# Patient Record
Sex: Male | Born: 1965
Health system: Southern US, Community
[De-identification: ages and names within clinical notes are randomized; demographics above are authoritative.]

## PROBLEM LIST (undated history)

## (undated) DIAGNOSIS — F329 Major depressive disorder, single episode, unspecified: Secondary | ICD-10-CM

## (undated) DIAGNOSIS — L409 Psoriasis, unspecified: Secondary | ICD-10-CM

## (undated) DIAGNOSIS — E291 Testicular hypofunction: Secondary | ICD-10-CM

## (undated) DIAGNOSIS — G540 Brachial plexus disorders: Secondary | ICD-10-CM

## (undated) DIAGNOSIS — R5383 Other fatigue: Secondary | ICD-10-CM

## (undated) DIAGNOSIS — G43909 Migraine, unspecified, not intractable, without status migrainosus: Secondary | ICD-10-CM

## (undated) DIAGNOSIS — G4733 Obstructive sleep apnea (adult) (pediatric): Secondary | ICD-10-CM

## (undated) DIAGNOSIS — R51 Headache: Secondary | ICD-10-CM

## (undated) DIAGNOSIS — K635 Polyp of colon: Secondary | ICD-10-CM

## (undated) DIAGNOSIS — R0981 Nasal congestion: Secondary | ICD-10-CM

## (undated) DIAGNOSIS — N529 Male erectile dysfunction, unspecified: Secondary | ICD-10-CM

## (undated) DIAGNOSIS — K219 Gastro-esophageal reflux disease without esophagitis: Secondary | ICD-10-CM

## (undated) DIAGNOSIS — F32A Depression, unspecified: Secondary | ICD-10-CM

## (undated) DIAGNOSIS — K760 Fatty (change of) liver, not elsewhere classified: Secondary | ICD-10-CM

## (undated) DIAGNOSIS — R519 Headache, unspecified: Secondary | ICD-10-CM

## (undated) DIAGNOSIS — T7840XA Allergy, unspecified, initial encounter: Secondary | ICD-10-CM

## (undated) DIAGNOSIS — M1990 Unspecified osteoarthritis, unspecified site: Secondary | ICD-10-CM

## (undated) DIAGNOSIS — IMO0001 Reserved for inherently not codable concepts without codable children: Secondary | ICD-10-CM

## (undated) DIAGNOSIS — N400 Enlarged prostate without lower urinary tract symptoms: Secondary | ICD-10-CM

## (undated) DIAGNOSIS — R161 Splenomegaly, not elsewhere classified: Secondary | ICD-10-CM

## (undated) DIAGNOSIS — I10 Essential (primary) hypertension: Secondary | ICD-10-CM

## (undated) DIAGNOSIS — R7401 Elevation of levels of liver transaminase levels: Secondary | ICD-10-CM

## (undated) DIAGNOSIS — Z87442 Personal history of urinary calculi: Secondary | ICD-10-CM

## (undated) DIAGNOSIS — E785 Hyperlipidemia, unspecified: Secondary | ICD-10-CM

## (undated) DIAGNOSIS — G4730 Sleep apnea, unspecified: Secondary | ICD-10-CM

## (undated) DIAGNOSIS — R030 Elevated blood-pressure reading, without diagnosis of hypertension: Secondary | ICD-10-CM

## (undated) DIAGNOSIS — G4731 Primary central sleep apnea: Secondary | ICD-10-CM

## (undated) DIAGNOSIS — J45909 Unspecified asthma, uncomplicated: Secondary | ICD-10-CM

## (undated) DIAGNOSIS — K146 Glossodynia: Secondary | ICD-10-CM

## (undated) DIAGNOSIS — G4739 Other sleep apnea: Secondary | ICD-10-CM

## (undated) DIAGNOSIS — N200 Calculus of kidney: Secondary | ICD-10-CM

## (undated) DIAGNOSIS — R740 Nonspecific elevation of levels of transaminase and lactic acid dehydrogenase [LDH]: Secondary | ICD-10-CM

## (undated) DIAGNOSIS — F419 Anxiety disorder, unspecified: Secondary | ICD-10-CM

## (undated) HISTORY — DX: Migraine, unspecified, not intractable, without status migrainosus: G43.909

## (undated) HISTORY — DX: Headache, unspecified: R51.9

## (undated) HISTORY — DX: Testicular hypofunction: E29.1

## (undated) HISTORY — DX: Headache: R51

## (undated) HISTORY — DX: Benign prostatic hyperplasia without lower urinary tract symptoms: N40.0

## (undated) HISTORY — DX: Major depressive disorder, single episode, unspecified: F32.9

## (undated) HISTORY — DX: Sleep apnea, unspecified: G47.30

## (undated) HISTORY — PX: SCALENE NODE BIOPSY / EXCISION: SUR129

## (undated) HISTORY — DX: Allergy, unspecified, initial encounter: T78.40XA

## (undated) HISTORY — DX: Nonspecific elevation of levels of transaminase and lactic acid dehydrogenase (ldh): R74.0

## (undated) HISTORY — DX: Other fatigue: R53.83

## (undated) HISTORY — DX: Depression, unspecified: F32.A

## (undated) HISTORY — DX: Anxiety disorder, unspecified: F41.9

## (undated) HISTORY — PX: OTHER SURGICAL HISTORY: SHX169

## (undated) HISTORY — DX: Reserved for inherently not codable concepts without codable children: IMO0001

## (undated) HISTORY — DX: Male erectile dysfunction, unspecified: N52.9

## (undated) HISTORY — PX: NOSE SURGERY: SHX723

## (undated) HISTORY — DX: Calculus of kidney: N20.0

## (undated) HISTORY — DX: Elevated blood-pressure reading, without diagnosis of hypertension: R03.0

## (undated) HISTORY — DX: Unspecified osteoarthritis, unspecified site: M19.90

## (undated) HISTORY — DX: Elevation of levels of liver transaminase levels: R74.01

## (undated) HISTORY — PX: NASAL SEPTUM SURGERY: SHX37

## (undated) HISTORY — PX: VASECTOMY: SHX75

---
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
## 2005-02-12 ENCOUNTER — Encounter: Admission: RE | Admit: 2005-02-12 | Discharge: 2005-02-12 | Payer: Self-pay | Admitting: Thoracic Surgery

## 2005-06-22 ENCOUNTER — Emergency Department: Payer: Self-pay | Admitting: Unknown Physician Specialty

## 2006-12-24 ENCOUNTER — Other Ambulatory Visit: Payer: Self-pay

## 2006-12-24 ENCOUNTER — Emergency Department: Payer: Self-pay | Admitting: Emergency Medicine

## 2007-01-25 ENCOUNTER — Ambulatory Visit: Payer: Self-pay | Admitting: Pain Medicine

## 2009-09-08 ENCOUNTER — Emergency Department: Payer: Self-pay | Admitting: Emergency Medicine

## 2009-12-22 HISTORY — PX: THORACIC OUTLET SURGERY: SHX2502

## 2009-12-22 HISTORY — PX: OTHER SURGICAL HISTORY: SHX169

## 2014-04-26 ENCOUNTER — Ambulatory Visit: Payer: Self-pay

## 2015-03-24 DIAGNOSIS — R5383 Other fatigue: Secondary | ICD-10-CM | POA: Insufficient documentation

## 2015-03-24 DIAGNOSIS — N529 Male erectile dysfunction, unspecified: Secondary | ICD-10-CM | POA: Insufficient documentation

## 2015-03-24 DIAGNOSIS — N400 Enlarged prostate without lower urinary tract symptoms: Secondary | ICD-10-CM | POA: Insufficient documentation

## 2015-03-24 DIAGNOSIS — I10 Essential (primary) hypertension: Secondary | ICD-10-CM | POA: Insufficient documentation

## 2015-03-24 DIAGNOSIS — IMO0002 Reserved for concepts with insufficient information to code with codable children: Secondary | ICD-10-CM | POA: Insufficient documentation

## 2015-03-24 DIAGNOSIS — R740 Nonspecific elevation of levels of transaminase and lactic acid dehydrogenase [LDH]: Secondary | ICD-10-CM

## 2015-04-02 ENCOUNTER — Telehealth: Payer: Self-pay

## 2015-04-02 LAB — HEPATIC FUNCTION PANEL
ALT: 33 U/L (ref 10–40)
AST: 26 U/L (ref 14–40)
Alkaline Phosphatase: 82 U/L (ref 25–125)
Bilirubin, Direct: 0.4 mg/dL (ref 0.01–0.4)
Bilirubin, Total: 0.4 mg/dL

## 2015-04-02 LAB — BASIC METABOLIC PANEL
BUN: 17 mg/dL (ref 4–21)
CREATININE: 1 mg/dL (ref 0.6–1.3)
Glucose: 87 mg/dL
POTASSIUM: 5 mmol/L (ref 3.4–5.3)
SODIUM: 141 mmol/L (ref 137–147)

## 2015-04-02 LAB — CBC AND DIFFERENTIAL
PLATELETS: 211 10*3/uL (ref 150–399)
WBC: 7.2 10*3/mL

## 2015-04-02 NOTE — Telephone Encounter (Signed)
Who is the patient's wife?  Will need to call and see if can get information regarding her and then send as a staff message if she is not in our system.  In reviewing the chart, I have never seen this pt either.

## 2015-04-02 NOTE — Telephone Encounter (Signed)
The patient's wife called and is hoping to become an established pt with Dr.Scott.

## 2015-04-04 ENCOUNTER — Ambulatory Visit (INDEPENDENT_AMBULATORY_CARE_PROVIDER_SITE_OTHER): Payer: 59 | Admitting: Nurse Practitioner

## 2015-04-04 ENCOUNTER — Encounter (INDEPENDENT_AMBULATORY_CARE_PROVIDER_SITE_OTHER): Payer: Self-pay

## 2015-04-04 ENCOUNTER — Encounter: Payer: Self-pay | Admitting: Nurse Practitioner

## 2015-04-04 VITALS — BP 108/78 | HR 89 | Temp 98.5°F | Resp 14 | Ht 74.0 in | Wt 205.8 lb

## 2015-04-04 DIAGNOSIS — G4730 Sleep apnea, unspecified: Secondary | ICD-10-CM | POA: Diagnosis not present

## 2015-04-04 DIAGNOSIS — Z91048 Other nonmedicinal substance allergy status: Secondary | ICD-10-CM

## 2015-04-04 DIAGNOSIS — Z7189 Other specified counseling: Secondary | ICD-10-CM | POA: Diagnosis not present

## 2015-04-04 DIAGNOSIS — G43809 Other migraine, not intractable, without status migrainosus: Secondary | ICD-10-CM

## 2015-04-04 DIAGNOSIS — R5382 Chronic fatigue, unspecified: Secondary | ICD-10-CM

## 2015-04-04 DIAGNOSIS — Z87442 Personal history of urinary calculi: Secondary | ICD-10-CM

## 2015-04-04 DIAGNOSIS — Z7689 Persons encountering health services in other specified circumstances: Secondary | ICD-10-CM

## 2015-04-04 DIAGNOSIS — L409 Psoriasis, unspecified: Secondary | ICD-10-CM

## 2015-04-04 DIAGNOSIS — F4323 Adjustment disorder with mixed anxiety and depressed mood: Secondary | ICD-10-CM

## 2015-04-04 DIAGNOSIS — M542 Cervicalgia: Secondary | ICD-10-CM

## 2015-04-04 DIAGNOSIS — G629 Polyneuropathy, unspecified: Secondary | ICD-10-CM

## 2015-04-04 DIAGNOSIS — Z9109 Other allergy status, other than to drugs and biological substances: Secondary | ICD-10-CM

## 2015-04-04 NOTE — Progress Notes (Signed)
Subjective:    Patient ID: Daniel Calderon, male    DOB: [birth year], 49 y.o.   MRN: [id number]  HPI  Mr. Aderman is a 49 yo male establishing care and CC of fatigue.  1) Health Maintenance-   Diet- Cutting down on beer  Exercise- Active in daily life  Immunizations- UTD  Eye Exam- Not UTD  Dental Exam- Not UTD  PSA- prostate issues, 0.3 on 08/30/14     2) Chronic Problems-  Anxiety/Depression- worker's comp injury for several years   Omeprazole- stable on this   Nuvaigil one tablet a day  Testosterone   Neck pain- 7 nerve blocks- 1 caused numbness on the left side of the head   CPAP- use at night, 8 hours of sleep  Allergies- flonase and claritin D   Kidney stones- 3 years ago several episodes    Migraines- frequent, tired, neck problems, uses tens unit   3) Acute Problems-  Fatigue- allergies, trying honey 1 tsp daily from local source  2 weeks ago left work on a half day and stayed out the next day, aching, eyes blurry, fatigue, rested and felt somewhat better happened again later with vibrating feeling of bilateral legs for 5 minutes on and off   Has psoriasis on knees Right shouler, and lowe leg laterally was burning/stinging, wounds pop up randomly he reports.    Review of Systems  Constitutional: Positive for diaphoresis and fatigue. Negative for fever and chills.       Night sweats  HENT: Negative for tinnitus and trouble swallowing.   Eyes: Positive for visual disturbance.  Respiratory: Negative for chest tightness, shortness of breath and wheezing.   Cardiovascular: Negative for chest pain, palpitations and leg swelling.  Gastrointestinal: Negative for nausea, vomiting, diarrhea and constipation.  Genitourinary: Negative for difficulty urinating.  Musculoskeletal: Positive for back pain, arthralgias and neck pain. Negative for gait problem.  Skin: Positive for rash.  Allergic/Immunologic: Positive for environmental allergies. Negative for food allergies.    Neurological: Positive for weakness, numbness and headaches. Negative for dizziness.  Hematological: Does not bruise/bleed easily.  Psychiatric/Behavioral: Positive for decreased concentration. Negative for suicidal ideas. The patient is nervous/anxious.    Past Medical History  Diagnosis Date  . Allergy     Seasonal  . Anxiety   . Depression   . Sleep apnea     Currently uses cpap  . Kidney stones     History of kidney stones  . Migraine   . Frequent headaches     History   Social History  . Marital Status: Married    Spouse Name: N/A  . Number of Children: N/A  . Years of Education: N/A   Occupational History  . Not on file.   Social History Main Topics  . Smoking status: Never Smoker   . Smokeless tobacco: Never Used  . Alcohol Use: 1.2 oz/week    2 Glasses of wine per week  . Drug Use: No  . Sexual Activity:    Partners: Female     Comment: Wife   Other Topics Concern  . Not on file   Social History Narrative   [employer]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    Lives with wife and daughter (60)   High school and tech school   Caffeine- 2-3 coffee    Enjoys- yard work, Location manager- 2 dogs        Past Surgical History  Procedure Laterality Date  . Left shoulder surgery  2011  .  Scalene node biopsy / excision    . Nose surgery    . Nerve block      2 in neck. 5 in back.  . Vasectomy      Family History  Problem Relation Age of Onset  . Hypertension Mother   . Hearing loss Mother   . Heart disease Other   . Diabetes Brother   . Stroke Paternal Uncle   . Hearing loss Maternal Grandmother     Allergies  Allergen Reactions  . Erythromycin Nausea And Vomiting  . Ibuprofen Itching    Current Outpatient Prescriptions on File Prior to Visit  Medication Sig Dispense Refill  . fluticasone (FLONASE) 50 MCG/ACT nasal spray Place into the nose.    Marland Kitchen OMEPRAZOLE PO Take by mouth.    . tadalafil (CIALIS) 20 MG tablet Take by mouth.    . Testosterone 75 MG PLLT  TESTOPEL, 75MG  (Implant Pellet) - Historical Medication  (75 MG) Active     No current facility-administered medications on file prior to visit.      Objective:   Physical Exam  Constitutional: He is oriented to person, place, and time. He appears well-developed and well-nourished. No distress.  BP 108/78 mmHg  Pulse 89  Temp(Src) 98.5 F (36.9 C) (Oral)  Resp 14  Ht 6\' 2"  (1.88 m)  Wt 205 lb 12.8 oz (93.35 kg)  BMI 26.41 kg/m2  SpO2 97%   HENT:  Head: Normocephalic and atraumatic.  Right Ear: External ear normal.  Left Ear: External ear normal.  Eyes: Right eye exhibits no discharge. Left eye exhibits no discharge. No scleral icterus.  Neck: Normal range of motion. Neck supple.  Cardiovascular: Normal rate, regular rhythm, normal heart sounds and intact distal pulses.  Exam reveals no gallop and no friction rub.   No murmur heard. Pulmonary/Chest: Effort normal and breath sounds normal. No respiratory distress. He has no wheezes. He has no rales. He exhibits no tenderness.  Lymphadenopathy:    He has no cervical adenopathy.  Neurological: He is alert and oriented to person, place, and time.  Skin: Skin is warm and dry. He is not diaphoretic.  Small well healing scars on legs (2 less approx 1 cm)  Psychiatric: He has a normal mood and affect. His behavior is normal. Judgment and thought content normal.      Assessment & Plan:

## 2015-04-04 NOTE — Progress Notes (Signed)
Pre visit review using our clinic review tool, if applicable. No additional management support is needed unless otherwise documented below in the visit note. 

## 2015-04-04 NOTE — Patient Instructions (Signed)
Try the gabapentin.  Follow up with neurology on the 10th.   See if they can forward latest blood work to [name] when it comes back.   Follow up in 1 month.

## 2015-04-11 ENCOUNTER — Other Ambulatory Visit: Payer: Self-pay | Admitting: *Deleted

## 2015-04-11 MED ORDER — SERTRALINE HCL 100 MG PO TABS: 100.0000 mg | ORAL_TABLET | Freq: Every day | ORAL | Status: DC

## 2015-04-11 NOTE — Telephone Encounter (Signed)
Left VM, needing refill Sertraline. Rx sent to pharmacy by escript

## 2015-04-13 DIAGNOSIS — Z9109 Other allergy status, other than to drugs and biological substances: Secondary | ICD-10-CM | POA: Insufficient documentation

## 2015-04-13 DIAGNOSIS — G4730 Sleep apnea, unspecified: Secondary | ICD-10-CM | POA: Insufficient documentation

## 2015-04-13 DIAGNOSIS — G43909 Migraine, unspecified, not intractable, without status migrainosus: Secondary | ICD-10-CM | POA: Insufficient documentation

## 2015-04-13 DIAGNOSIS — F4323 Adjustment disorder with mixed anxiety and depressed mood: Secondary | ICD-10-CM | POA: Insufficient documentation

## 2015-04-13 DIAGNOSIS — Z87442 Personal history of urinary calculi: Secondary | ICD-10-CM | POA: Insufficient documentation

## 2015-04-13 DIAGNOSIS — Z7689 Persons encountering health services in other specified circumstances: Secondary | ICD-10-CM | POA: Insufficient documentation

## 2015-04-13 DIAGNOSIS — G629 Polyneuropathy, unspecified: Secondary | ICD-10-CM | POA: Insufficient documentation

## 2015-04-13 DIAGNOSIS — L409 Psoriasis, unspecified: Secondary | ICD-10-CM | POA: Insufficient documentation

## 2015-04-13 DIAGNOSIS — M542 Cervicalgia: Secondary | ICD-10-CM | POA: Insufficient documentation

## 2015-04-13 NOTE — Assessment & Plan Note (Signed)
Pt reports this is from occipital neuralgia. Has TENS unit.

## 2015-04-13 NOTE — Assessment & Plan Note (Signed)
On knees, pt reports he was seeing dermatology for this.

## 2015-04-13 NOTE — Assessment & Plan Note (Signed)
7 nerve blocks 1 resulted in numbness on the left side of the head

## 2015-04-13 NOTE — Assessment & Plan Note (Signed)
Uncontrolled. Pt has severe episodes of fatigue intermittently. Will follow.

## 2015-04-13 NOTE — Assessment & Plan Note (Signed)
8 hours of sleep with CPAP, still wakes up tired. Will follow.

## 2015-04-13 NOTE — Assessment & Plan Note (Signed)
Worker's comp injury. Pt reports anxiety with mixed bouts of depression. Controlled on Zoloft.

## 2015-04-13 NOTE — Assessment & Plan Note (Signed)
Flonase, Claritin D and 1 tsp daily of local honey. Somewhat controlled.

## 2015-04-13 NOTE — Assessment & Plan Note (Signed)
Uncontrolled. Pt was given gabapentin, but has not started or picked it up yet. Asked pt to try it. Will follow up in 1 month

## 2015-04-13 NOTE — Assessment & Plan Note (Signed)
Discussed acute and chronic issues. Reviewed health maintenance measures, PFSHx, and immunizations.   

## 2015-05-03 ENCOUNTER — Ambulatory Visit: Payer: 59 | Admitting: Nurse Practitioner

## 2015-05-03 DIAGNOSIS — Z0289 Encounter for other administrative examinations: Secondary | ICD-10-CM

## 2015-05-07 ENCOUNTER — Encounter: Payer: Self-pay | Admitting: Nurse Practitioner

## 2015-05-24 ENCOUNTER — Ambulatory Visit (INDEPENDENT_AMBULATORY_CARE_PROVIDER_SITE_OTHER): Payer: 59 | Admitting: Urology

## 2015-05-24 ENCOUNTER — Encounter: Payer: Self-pay | Admitting: Urology

## 2015-05-24 VITALS — BP 138/85 | HR 90 | Ht 74.0 in | Wt 199.7 lb

## 2015-05-24 DIAGNOSIS — F5221 Male erectile disorder: Secondary | ICD-10-CM | POA: Diagnosis not present

## 2015-05-24 DIAGNOSIS — E291 Testicular hypofunction: Secondary | ICD-10-CM | POA: Insufficient documentation

## 2015-05-24 DIAGNOSIS — N400 Enlarged prostate without lower urinary tract symptoms: Secondary | ICD-10-CM

## 2015-05-24 NOTE — Progress Notes (Signed)
05/24/2015 2:21 PM   Daniel Calderon 1966/08/03 [id number]  Referring provider: Rubbie Battiest, NP [address] [place],  [address]  Chief Complaint  Patient presents with  . Hypogonadism  . Benign Prostatic Hypertrophy    HPI:    Hypogonadism-  Patient presents today for a recheck on his hypogonadism.  His hypogonadism is currently being managed with Testopel. His last insertion was on 01/19/2015. His last  total serum testosterone on 02/20/2015 was 605 ng/dL.  He has filled out the ADAM questionnaire (results below).  He has noticed over the last 2 weeks an increase in the lack of energy, his erections being less strong, a deterioration in his ability to play sports, a deterioration in his work performance and falling asleep after dinner. Patient has been diagnosed with obstructive sleep apnea and he does use his CPAP machine nightly. He states he still experiences sadness and/or grumpiness even when his testosterone levels are within normal range.                         ADAM Questionnaire             #1. Do you have a decrease in libido? No  #2. Do you have a lack of energy? Yes-started 2 weeks ago  #3. Do you have a decrease in strength and/or endurance? Yes-varies  #4. Have you lost height? No  #5. Have you noticed a decreased "enjoyment of life?" No  #6. Are you sad and/or grumpy? Yes  #7. Are your erections less strong? Yes-started 2 weeks ago  #8. Have you noticed a recent deterioration in your ability to play sports? Yes-started 2 weeks ago  #9. Are you falling asleep after dinner? Yes-started 2 weeks ago  BPH- Today, patient's IPSS score is 1/1. On DRE, he has mild prostate enlargement and no nodules were appreciated. He does experience urinary intermittency, urgency and nocturia he does not find any symptoms bothersome to him. He denies any dysuria, recent UTIs and/or gross hematuria. He also denies any associated fevers, chills, nausea,  vomiting and/or suprapubic pain.  Erectile dysfunction- Patient's ED is managed with on-demand Cialis 20 mg.  He is not reported any painful erections and/or curvature with his erections.      PMH: Past Medical History  Diagnosis Date  . Allergy     Seasonal  . Anxiety   . Depression   . Sleep apnea     Currently uses cpap  . Kidney stones     History of kidney stones  . Migraine   . Frequent headaches   . Hypogonadism in male   . Failure of erection   . Benign enlargement of prostate   . Elevated transaminase level     Surgical History: Past Surgical History  Procedure Laterality Date  . Left shoulder surgery  2011  . Scalene node biopsy / excision    . Nose surgery    . Nerve block      2 in neck. 5 in back.  . Vasectomy      Home Medications:    Medication List       This list is accurate as of: 05/24/15  2:21 PM.  Always use your most recent med list.               Armodafinil 250 MG tablet  Take 250 mg by mouth daily.     fluticasone 50 MCG/ACT nasal spray  Commonly  known as:  FLONASE  Place into the nose.     OMEPRAZOLE PO  Take by mouth.     sertraline 100 MG tablet  Commonly known as:  ZOLOFT  Take 1 tablet (100 mg total) by mouth daily.     tadalafil 20 MG tablet  Commonly known as:  CIALIS  Take by mouth.     Testosterone 75 MG Pllt  TESTOPEL, 75MG  (Implant Pellet) - Historical Medication  (75 MG) Active        Allergies:  Allergies  Allergen Reactions  . Erythromycin Nausea And Vomiting  . Cephalexin     Other reaction(s): Other (See Comments) Other Reaction: Other reaction  . Dexamethasone     Other reaction(s): Other (See Comments) Other Reaction: Other reaction  . Oxycodone-Acetaminophen     Other reaction(s): Other (See Comments) Other Reaction: Other reaction  . Prednisone     Other reaction(s): Other (See Comments) Other Reaction: Other reaction  . Ibuprofen Itching    Family History: Family History  Problem  Relation Age of Onset  . Hypertension Mother   . Hearing loss Mother   . Heart disease Other   . Diabetes Brother   . Stroke Paternal Uncle   . Hearing loss Maternal Grandmother     Social History:  reports that he has never smoked. He has never used smokeless tobacco. He reports that he drinks about 1.2 oz of alcohol per week. He reports that he does not use illicit drugs.  ROS: Urological Symptom Review  Patient is experiencing the following symptoms: Hard to postpone urination, nocturia and intermittency   Review of Systems  Gastrointestinal (upper)  : Negative for upper GI symptoms  Gastrointestinal (lower) : Negative for lower GI symptoms  Constitutional : Negative for symptoms  Skin: Hair loss  Eyes: Negative for eye symptoms  Ear/Nose/Throat : Sinus problems  Hematologic/Lymphatic: Negative for Hematologic/Lymphatic symptoms  Cardiovascular : Negative for cardiovascular symptoms  Respiratory : Shortness of breath  Endocrine: Negative for endocrine symptoms  Musculoskeletal: Back pain Joint pain  Neurological: Headaches  Psychologic: Depression Anxiety   Physical Exam: BP 138/85 mmHg  Pulse 90  Ht 6\' 2"  (1.88 m)  Wt 199 lb 11.2 oz (90.583 kg)  BMI 25.63 kg/m2  GU: Patient with a circumcised penis.  Patent meatus. No urethral discharge. Normal penis. Scrotum without lesions and/or swelling. Testicles located scrotally bilaterally, no masses appreciated.  Epididymis are normal bilaterally.  Rectal:  Patient with normal sphincter tone.  Some external hemorrhoids are noted.  Prostate is ~50 grams, no nodules appreciated.  Seminal vesicals are normal.   Laboratory Data: Lab Results  Component Value Date   WBC 7.2 04/02/2015   PLT 211 04/02/2015    Lab Results  Component Value Date   CREATININE 1.0 04/02/2015    No results found for: PSA  No results found for: TESTOSTERONE  No results found for: HGBA1C  Urinalysis No results  found for: COLORURINE, APPEARANCEUR, LABSPEC, PHURINE, GLUCOSEU, HGBUR, BILIRUBINUR, KETONESUR, PROTEINUR, UROBILINOGEN, NITRITE, LEUKOCYTESUR  Pertinent Imaging:  Assessment & Plan:  1. Hypogonadism-  His hypogonadism is currently being managed with Testopel. His last insertion was on 01/19/2015. His last  total serum testosterone on 02/20/2015 was 605 ng/dL.  He had his morning total serum testosterone level/ HCT/ PSA drawn this am before the appointment.  He will be scheduled for a Testopel insertion in one week.  If labs return abnormal and/or the testosterone is in the normal range, we will post-pone the  insertion.  I also explained to the patient the new FDA restrictions for the pellets, which is 6 pellets every 90 days.    2. BPH with LUTS-  Patient's past PSA's are 0.3 ng/mL on 08/30/2014 and 0.3 ng/mL on 01/09/2015.  He had his PSA drawn today. His IPSS score today is 6/1.  Since his symptoms are mild, we will follow the patient with an IPSS/DRE/PSA every 6 months.   3. Erectile dysfunction- Patient's ED is well managed with on demand Cialis 20 mg.  He does not require a refill today.  He will fill out a SHIM at his next office visit.   Problem List Items Addressed This Visit      Endocrine   Hypogonadism in male - Primary   Relevant Orders   PSA   Testosterone   Hematocrit    Other Visit Diagnoses    BPH (benign prostatic hyperplasia)        Relevant Orders    PSA    Testosterone    Hematocrit    Erectile disorder, generalized, mild           No Follow-up on file.  [facility] [address] [place], [place] [address] [phone]

## 2015-05-25 ENCOUNTER — Telehealth: Payer: Self-pay | Admitting: Urology

## 2015-05-25 LAB — TESTOSTERONE: Testosterone: 339 ng/dL — ABNORMAL LOW (ref 348–1197)

## 2015-05-25 LAB — PSA: PROSTATE SPECIFIC AG, SERUM: 0.3 ng/mL (ref 0.0–4.0)

## 2015-05-25 LAB — HEMATOCRIT: HEMATOCRIT: 44.8 % (ref 37.5–51.0)

## 2015-05-25 NOTE — Telephone Encounter (Signed)
Lab works is normal for the exception of the serum testosterone, which is low.  We expected this.  It is appropriate to proceed with Testopel.

## 2015-05-29 NOTE — Progress Notes (Signed)
Thank you Larene Beach for seeing him. Our patients are always singing your praises! Have a great week.

## 2015-06-01 ENCOUNTER — Ambulatory Visit: Payer: Self-pay | Admitting: Urology

## 2015-06-05 ENCOUNTER — Ambulatory Visit (INDEPENDENT_AMBULATORY_CARE_PROVIDER_SITE_OTHER): Payer: 59 | Admitting: Urology

## 2015-06-05 ENCOUNTER — Encounter: Payer: Self-pay | Admitting: Urology

## 2015-06-05 VITALS — BP 133/83 | HR 86 | Ht 74.0 in | Wt 196.4 lb

## 2015-06-05 DIAGNOSIS — E291 Testicular hypofunction: Secondary | ICD-10-CM

## 2015-06-05 NOTE — Progress Notes (Signed)
06/05/2015 1:06 PM   [name] 1966/05/24 [id number]  Referring provider: Rubbie Battiest, NP [address] [place], [place] [address]  Chief Complaint  Patient presents with  . Hypogonadism    Testopel placement    HPI: Mr. Hue is a 49 y/o white male with hypogonadism who was referred to [name] in 2015 by his PCP after his insurance would no longer cover his gel.  We discussed other treatment options at that visit and the risks associated with each.  He elected to try the pellets.  He has had three insertions prior to this visit.  He has been pleased with the results he has been getting with the Testopel.    He also has a h/o ED.  He is taking Cialis 20 mg with good results.    PMH: Past Medical History  Diagnosis Date  . Allergy     Seasonal  . Anxiety   . Depression   . Sleep apnea     Currently uses cpap  . Kidney stones     History of kidney stones  . Migraine   . Frequent headaches   . Hypogonadism in male   . Failure of erection   . Benign enlargement of prostate   . Elevated transaminase level     Surgical History: Past Surgical History  Procedure Laterality Date  . Left shoulder surgery  2011  . Scalene node biopsy / excision    . Nose surgery    . Nerve block      2 in neck. 5 in back.  . Vasectomy      Home Medications:    Medication List       This list is accurate as of: 06/05/15  1:06 PM.  Always use your most recent med list.               Armodafinil 250 MG tablet  Take 250 mg by mouth daily.     fluticasone 50 MCG/ACT nasal spray  Commonly known as:  FLONASE  Place into the nose.     OMEPRAZOLE PO  Take by mouth.     sertraline 100 MG tablet  Commonly known as:  ZOLOFT  Take 1 tablet (100 mg total) by mouth daily.     tadalafil 20 MG tablet  Commonly known as:  CIALIS  Take by mouth.     Testosterone 75 MG Pllt  TESTOPEL, 75MG  (Implant Pellet) - Historical Medication  (75 MG) Active         Allergies:  Allergies  Allergen Reactions  . Erythromycin Nausea And Vomiting  . Cephalexin     Other reaction(s): Other (See Comments) Other Reaction: Other reaction  . Dexamethasone     Other reaction(s): Other (See Comments) Other Reaction: Other reaction  . Oxycodone-Acetaminophen     Other reaction(s): Other (See Comments) Other Reaction: Other reaction  . Prednisone     Other reaction(s): Other (See Comments) Other Reaction: Other reaction  . Ibuprofen Itching    Family History: Family History  Problem Relation Age of Onset  . Hypertension Mother   . Hearing loss Mother   . Heart disease Other   . Diabetes Brother   . Stroke Paternal Uncle   . Hearing loss Maternal Grandmother     Social History:  reports that he has never smoked. He has never used smokeless tobacco. He reports that he drinks about 1.2 oz of alcohol per week. He reports that he does not  use illicit drugs.  ROS: Urological Symptom Review  Patient is experiencing the following symptoms: Erection problems (male only)   Review of Systems  Gastrointestinal (upper)  : Negative for upper GI symptoms  Gastrointestinal (lower) : Negative for lower GI symptoms  Constitutional : Negative for symptoms  Skin: Negative for skin symptoms  Eyes: Negative for eye symptoms  Ear/Nose/Throat : Negative for Ear/Nose/Throat symptoms  Hematologic/Lymphatic: Negative for Hematologic/Lymphatic symptoms  Cardiovascular : Negative for cardiovascular symptoms  Respiratory : Shortness of breath  Endocrine: Negative for endocrine symptoms  Musculoskeletal: Back pain Joint pain  Neurological: Headaches  Psychologic: Depression Anxiety   Physical Exam: BP 133/83 mmHg  Pulse 86  Ht 6\' 2"  (1.88 m)  Wt 196 lb 6.4 oz (89.086 kg)  BMI 25.21 kg/m2   Laboratory Data: Lab Results  Component Value Date   WBC 7.2 04/02/2015   HCT 44.8 05/24/2015   PLT 211 04/02/2015    Lab Results   Component Value Date   CREATININE 1.0 04/02/2015    No results found for: PSA  Lab Results  Component Value Date   TESTOSTERONE 339* 05/24/2015    No results found for: HGBA1C  Urinalysis No results found for: COLORURINE, APPEARANCEUR, LABSPEC, [place], GLUCOSEU, HGBUR, BILIRUBINUR, KETONESUR, PROTEINUR, UROBILINOGEN, NITRITE, LEUKOCYTESUR  Procedure: This is a 49 year old male with hypogonadism and he is managed with Testopel. He presents today for Testopel insertion.  Patient is placed on the exam table in the left lateral jackknife position.  Identified upper outer quadrant of hip for insertion; prepped area with Betadine and injected 10 cc's of Lidocaine 1% with Epinephrine to anesthetize superficially and distally along trocar tract.  Made 3 mm incision using 15 blade of scalpel; trocar with sharp ended stylet was inserted into subcutaneous tissue in line with femur. Sharp stylet was withdrawn and 6 pellets were placed into trocar well. Testopel pellets advanced into tissue using blunt ended stylet. Trocar removed and incision closed using 6 Steri-Strips. Cleansed area to remove Betadine and covered Steri-Strips with outer Band-Aid.  Careful inspection of insertion is done and patient informed of post procedure instructions.  He will return in one month for serum testosterone before 10:00 am.   Assessment & Plan:    1. Hypogonadism- Patient underwent Testopel insertion today.  He will RTC in one month for serum testosterone (8-10am)  2. Erectile dysfunction- Patient having good success with Cialis 20 mg.  He will RTC in 6 months for SHIM and symptom recheck.  3. BPH-  No family h/o PCa.  He will RTC in 6 months for IPSS and DRE.    There are no diagnoses linked to this encounter.  No Follow-up on file.  Zara Council, [facility] [address], [place], [place] [address] [phone]

## 2015-06-26 ENCOUNTER — Ambulatory Visit (INDEPENDENT_AMBULATORY_CARE_PROVIDER_SITE_OTHER): Payer: 59 | Admitting: Nurse Practitioner

## 2015-06-26 VITALS — BP 124/86 | HR 89 | Temp 98.4°F | Resp 18 | Ht 74.0 in | Wt 201.2 lb

## 2015-06-26 DIAGNOSIS — G629 Polyneuropathy, unspecified: Secondary | ICD-10-CM | POA: Diagnosis not present

## 2015-06-26 MED ORDER — PREGABALIN 25 MG PO CAPS: 25.0000 mg | ORAL_CAPSULE | Freq: Three times a day (TID) | ORAL | Status: DC

## 2015-06-26 MED ORDER — PREGABALIN 50 MG PO CAPS: 50.0000 mg | ORAL_CAPSULE | Freq: Three times a day (TID) | ORAL | Status: DC

## 2015-06-26 NOTE — Progress Notes (Signed)
   Subjective:    Patient ID: Daniel Calderon, male    DOB: 02-Apr-1966, 49 y.o.   MRN: [id number]  HPI  Mr. Cohenour is a 49 yo male with a CC of neuropathy.   1) Knee to side of leg, few numb toes,   Cancelled the appointment to see neurology, would like another referral for a in-network provider.  1 week and a half of the symptoms Burning on right leg  Numbness left side of toe and heel (same on both sides)   Lateral right knee and then top of foot Extended foot   Lasted for a few seconds- burning severe   Getting worse- more often, daily- hourly he reports   Cymbalta- not helpful  Methadone- Not helpful  Gabapentin- aggressive   Creatinine 0.98 in April 2016   Review of Systems  Constitutional: Negative for fever, chills, diaphoresis and fatigue.  Eyes: Negative for visual disturbance.  Cardiovascular: Negative for chest pain, palpitations and leg swelling.  Skin: Negative for rash.  Neurological: Positive for numbness. Negative for dizziness, tremors, seizures, syncope, facial asymmetry, speech difficulty, weakness, light-headedness and headaches.  Hematological: Does not bruise/bleed easily.  Psychiatric/Behavioral: Negative for suicidal ideas, sleep disturbance and self-injury. The patient is nervous/anxious.       Objective:   Physical Exam  Constitutional: He is oriented to person, place, and time. He appears well-developed and well-nourished. No distress.  BP 124/86 mmHg  Pulse 89  Temp(Src) 98.4 F (36.9 C)  Resp 18  Ht 6\' 2"  (1.88 m)  Wt 201 lb 3.2 oz (91.264 kg)  BMI 25.82 kg/m2  SpO2 96%   Musculoskeletal: Normal range of motion. He exhibits no edema or tenderness.  Neurological: He is alert and oriented to person, place, and time. He displays normal reflexes. No cranial nerve deficit. He exhibits normal muscle tone. Coordination normal.  Skin: Skin is warm and dry. No rash noted. He is not diaphoretic.  Psychiatric: Thought content normal. His mood  appears anxious. His speech is tangential. He is agitated. He expresses impulsivity. He exhibits normal recent memory and normal remote memory.      Assessment & Plan:  I personally spent 25 minutes face to face with the pt with greater than 50% of the time spent counseling on possible treatments, answering questions causes of neuropathic pain, and referral information.

## 2015-06-26 NOTE — Patient Instructions (Signed)
Pregabalin capsules What is this medicine? PREGABALIN (pre GAB a lin) is used to treat nerve pain from diabetes, shingles, spinal cord injury, and fibromyalgia. It is also used to control seizures in epilepsy. This medicine may be used for other purposes; ask your health care provider or pharmacist if you have questions. COMMON BRAND NAME(S): Lyrica What should I tell my health care provider before I take this medicine? They need to know if you have any of these conditions: -bleeding problems -heart disease, including heart failure -history of alcohol or drug abuse -kidney disease -suicidal thoughts, plans, or attempt; a previous suicide attempt by you or a family member -an unusual or allergic reaction to pregabalin, gabapentin, other medicines, foods, dyes, or preservatives -pregnant or trying to get pregnant or trying to conceive with your partner -breast-feeding How should I use this medicine? Take this medicine by mouth with a glass of water. Follow the directions on the prescription label. You can take this medicine with or without food. Take your doses at regular intervals. Do not take your medicine more often than directed. Do not stop taking except on your doctor's advice. A special MedGuide will be given to you by the pharmacist with each prescription and refill. Be sure to read this information carefully each time. Talk to your pediatrician regarding the use of this medicine in children. Special care may be needed. Overdosage: If you think you have taken too much of this medicine contact a poison control center or emergency room at once. NOTE: This medicine is only for you. Do not share this medicine with others. What if I miss a dose? If you miss a dose, take it as soon as you can. If it is almost time for your next dose, take only that dose. Do not take double or extra doses. What may interact with this medicine? -alcohol -certain medicines for blood pressure like captopril,  enalapril, or lisinopril -certain medicines for diabetes, like pioglitazone or rosiglitazone -certain medicines for anxiety or sleep -narcotic medicines for pain This list may not describe all possible interactions. Give your health care provider a list of all the medicines, herbs, non-prescription drugs, or dietary supplements you use. Also tell them if you smoke, drink alcohol, or use illegal drugs. Some items may interact with your medicine. What should I watch for while using this medicine? Tell your doctor or healthcare professional if your symptoms do not start to get better or if they get worse. Visit your doctor or health care professional for regular checks on your progress. Do not stop taking except on your doctor's advice. You may develop a severe reaction. Your doctor will tell you how much medicine to take. Wear a medical identification bracelet or chain if you are taking this medicine for seizures, and carry a card that describes your disease and details of your medicine and dosage times. You may get drowsy or dizzy. Do not drive, use machinery, or do anything that needs mental alertness until you know how this medicine affects you. Do not stand or sit up quickly, especially if you are an older patient. This reduces the risk of dizzy or fainting spells. Alcohol may interfere with the effect of this medicine. Avoid alcoholic drinks. If you have a heart condition, like congestive heart failure, and notice that you are retaining water and have swelling in your hands or feet, contact your health care provider immediately. The use of this medicine may increase the chance of suicidal thoughts or actions. Pay special attention   to how you are responding while on this medicine. Any worsening of mood, or thoughts of suicide or dying should be reported to your health care professional right away. This medicine has caused reduced sperm counts in some men. This may interfere with the ability to father a  child. You should talk to your doctor or health care professional if you are concerned about your fertility. Women who become pregnant while using this medicine for seizures may enroll in the North American Antiepileptic Drug Pregnancy Registry by calling 1-888-233-2334. This registry collects information about the safety of antiepileptic drug use during pregnancy. What side effects may I notice from receiving this medicine? Side effects that you should report to your doctor or health care professional as soon as possible: -allergic reactions like skin rash, itching or hives, swelling of the face, lips, or tongue -breathing problems -changes in vision -chest pain -confusion -jerking or unusual movements of any part of your body -loss of memory -muscle pain, tenderness, or weakness -suicidal thoughts or other mood changes -swelling of the ankles, feet, hands -unusual bruising or bleeding Side effects that usually do not require medical attention (Report these to your doctor or health care professional if they continue or are bothersome.): -dizziness -drowsiness -dry mouth -headache -nausea -tremors -trouble sleeping -weight gain This list may not describe all possible side effects. Call your doctor for medical advice about side effects. You may report side effects to FDA at 1-800-FDA-1088. Where should I keep my medicine? Keep out of the reach of children. This medicine can be abused. Keep your medicine in a safe place to protect it from theft. Do not share this medicine with anyone. Selling or giving away this medicine is dangerous and against the law. Store at room temperature between 15 and 30 degrees C (59 and 86 degrees F). Throw away any unused medicine after the expiration date. NOTE: This sheet is a summary. It may not cover all possible information. If you have questions about this medicine, talk to your doctor, pharmacist, or health care provider.  2015, Elsevier/Gold Standard.  (2011-06-12 20:00:36)  

## 2015-06-26 NOTE — Progress Notes (Signed)
Pre visit review using our clinic review tool, if applicable. No additional management support is needed unless otherwise documented below in the visit note. 

## 2015-06-29 ENCOUNTER — Other Ambulatory Visit: Payer: Self-pay | Admitting: *Deleted

## 2015-06-29 MED ORDER — ARMODAFINIL 250 MG PO TABS: 250.0000 mg | ORAL_TABLET | Freq: Every day | ORAL | Status: DC

## 2015-06-29 NOTE — Telephone Encounter (Signed)
Rx phoned into pharmacy.

## 2015-06-29 NOTE — Telephone Encounter (Signed)
Refill? Last visit 06/26/15

## 2015-07-07 ENCOUNTER — Encounter: Payer: Self-pay | Admitting: Nurse Practitioner

## 2015-07-07 NOTE — Assessment & Plan Note (Signed)
Uncontrolled still. Pt is a difficult historian. Will try Lyrica and follow up in 4 weeks, referral placed to neurology for EMG. Creatinine 0.98 in 4/16, no recent A1c seen. Need at next visit.

## 2015-07-07 NOTE — Addendum Note (Signed)
Addended by: Rubbie Battiest on: 07/07/2015 07:42 AM   Modules accepted: Level of Service

## 2015-07-09 ENCOUNTER — Ambulatory Visit: Payer: Self-pay | Admitting: Urology

## 2015-07-09 ENCOUNTER — Encounter: Payer: Self-pay | Admitting: Urology

## 2015-07-09 ENCOUNTER — Other Ambulatory Visit: Payer: Self-pay

## 2015-07-16 ENCOUNTER — Other Ambulatory Visit: Payer: Self-pay | Admitting: Nurse Practitioner

## 2015-07-16 ENCOUNTER — Other Ambulatory Visit: Payer: Self-pay

## 2015-07-16 MED ORDER — FLUTICASONE PROPIONATE 50 MCG/ACT NA SUSP: 2.0000 | Freq: Every day | NASAL | Status: DC

## 2015-07-16 MED ORDER — OMEPRAZOLE 20 MG PO CPDR: 20.0000 mg | DELAYED_RELEASE_CAPSULE | Freq: Every day | ORAL | Status: DC

## 2015-07-23 ENCOUNTER — Telehealth: Payer: Self-pay | Admitting: *Deleted

## 2015-07-23 ENCOUNTER — Other Ambulatory Visit: Payer: Self-pay | Admitting: Nurse Practitioner

## 2015-07-23 MED ORDER — SERTRALINE HCL 100 MG PO TABS: 100.0000 mg | ORAL_TABLET | Freq: Every day | ORAL | Status: DC

## 2015-07-23 NOTE — Telephone Encounter (Signed)
Pt called requesting sertraline refill.  Last OV 7.5.16.  Please advise refill

## 2015-07-23 NOTE — Telephone Encounter (Signed)
I sent the refill to the pharmacy thanks!

## 2015-07-24 ENCOUNTER — Ambulatory Visit: Payer: 59 | Admitting: Nurse Practitioner

## 2015-07-24 ENCOUNTER — Encounter: Payer: Self-pay | Admitting: *Deleted

## 2015-07-26 ENCOUNTER — Encounter: Payer: Self-pay | Admitting: Urology

## 2015-07-26 ENCOUNTER — Ambulatory Visit (INDEPENDENT_AMBULATORY_CARE_PROVIDER_SITE_OTHER): Payer: 59 | Admitting: Urology

## 2015-07-26 VITALS — BP 124/81 | HR 75 | Ht 74.0 in | Wt 201.3 lb

## 2015-07-26 DIAGNOSIS — E291 Testicular hypofunction: Secondary | ICD-10-CM

## 2015-07-26 DIAGNOSIS — F5221 Male erectile disorder: Secondary | ICD-10-CM | POA: Diagnosis not present

## 2015-07-26 NOTE — Progress Notes (Signed)
10:54 AM   [name] 1966/04/07 [id number]  Referring provider: Rubbie Battiest, NP [address] [place], [place] [address]  Chief Complaint  Patient presents with  . Follow-up    one month    HPI: [name] is a 49 y/o white male with hypogonadism who was referred to [name] in 2015 by his PCP after his insurance would no longer cover his gel.   We discussed other treatment options at that visit and the risks associated with each.  He elected to try the pellets.    He has had three insertions prior to this visit.  He has been pleased with the results he has been getting with the Testopel.    He also has a h/o ED.  He is taking Cialis 20 mg with mixed results.    PMH: Past Medical History  Diagnosis Date  . Allergy     Seasonal  . Anxiety   . Depression   . Sleep apnea     Currently uses cpap  . Kidney stones     History of kidney stones  . Migraine   . Frequent headaches   . Hypogonadism in male   . Failure of erection   . Benign enlargement of prostate   . Elevated transaminase level   . Fatigue   . Elevated BP     Surgical History: Past Surgical History  Procedure Laterality Date  . Left shoulder surgery  2011  . Scalene node biopsy / excision    . Nose surgery    . Nerve block      2 in neck. 5 in back.  . Vasectomy      Home Medications:    Medication List       This list is accurate as of: 07/26/15 10:54 AM.  Always use your most recent med list.               fluticasone 50 MCG/ACT nasal spray  Commonly known as:  FLONASE  Place 2 sprays into both nostrils daily.     NUVIGIL 250 MG tablet  Generic drug:  Armodafinil  TAKE 1 TABLET BY MOUTH DAILY     omeprazole 20 MG capsule  Commonly known as:  PRILOSEC  Take 1 capsule (20 mg total) by mouth daily.     pregabalin 25 MG capsule  Commonly known as:  LYRICA  Take 1 capsule (25 mg total) by mouth 3 (three) times daily.     sertraline 100 MG tablet    Commonly known as:  ZOLOFT  Take 1 tablet (100 mg total) by mouth daily.     tadalafil 20 MG tablet  Commonly known as:  CIALIS  Take by mouth.     Testosterone 75 MG Pllt  TESTOPEL, 75MG  (Implant Pellet) - Historical Medication  (75 MG) Active        Allergies:  Allergies  Allergen Reactions  . Erythromycin Nausea And Vomiting  . Cephalexin     Other reaction(s): Other (See Comments) Other Reaction: Other reaction  . Dexamethasone     Other reaction(s): Other (See Comments) Other Reaction: Other reaction  . Oxycodone-Acetaminophen     Other reaction(s): Other (See Comments) Other Reaction: Other reaction  . Prednisone     Other reaction(s): Other (See Comments) Other Reaction: Other reaction  . Gabapentin Other (See Comments)    Aggressive  . Ibuprofen Itching    Family History: Family History  Problem Relation Age of Onset  .  Hypertension Mother   . Hearing loss Mother   . Heart disease Other   . Diabetes Brother   . Stroke Paternal Uncle   . Hearing loss Maternal Grandmother     Social History:  reports that he has never smoked. He has never used smokeless tobacco. He reports that he drinks about 1.2 oz of alcohol per week. He reports that he does not use illicit drugs.  ROS: Urological Symptom Review  Patient is experiencing the following symptoms: Erection problems (male only)   Review of Systems  Gastrointestinal (upper)  : Negative for upper GI symptoms  Gastrointestinal (lower) : Negative for lower GI symptoms  Constitutional : Negative for symptoms  Skin: Negative for skin symptoms  Eyes: Negative for eye symptoms  Ear/Nose/Throat : Negative for Ear/Nose/Throat symptoms  Hematologic/Lymphatic: Negative for Hematologic/Lymphatic symptoms  Cardiovascular : Negative for cardiovascular symptoms  Respiratory : Shortness of breath  Endocrine: Negative for endocrine symptoms  Musculoskeletal: Back pain Joint  pain  Neurological: Headaches  Psychologic: Depression Anxiety   Physical Exam: BP 124/81 mmHg  Pulse 75  Ht 6\' 2"  (1.88 m)  Wt 201 lb 4.8 oz (91.309 kg)  BMI 25.83 kg/m2   Laboratory Data: Lab Results  Component Value Date   WBC 7.2 04/02/2015   HCT 44.8 05/24/2015   PLT 211 04/02/2015    Lab Results  Component Value Date   CREATININE 1.0 04/02/2015    Lab Results  Component Value Date   PSA 0.3 05/24/2015    Lab Results  Component Value Date   TESTOSTERONE 339* 05/24/2015    No results found for: HGBA1C  Urinalysis No results found for: COLORURINE, APPEARANCEUR, LABSPEC, PHURINE, GLUCOSEU, HGBUR, BILIRUBINUR, KETONESUR, PROTEINUR, UROBILINOGEN, NITRITE, LEUKOCYTESUR  Assessment & Plan:    1. Hypogonadism- Patient underwent Testopel insertion 06/05/2015.  We obtained a serum testosterone level today.   2. Erectile dysfunction- Patient having mixed success with Cialis 20 mg.  He would like to try Viagra.  I have given him Viagra 100 mg #2 samples.  3. BPH-  No family h/o PCa.  He will RTC in 6 months for IPSS and DRE.    There are no diagnoses linked to this encounter.  Return in about 6 weeks (around 09/06/2015) for Testopel insertion in mid September.  Zara Council, [facility] [address] [place], [place] [address] [phone]

## 2015-07-27 ENCOUNTER — Telehealth: Payer: Self-pay | Admitting: *Deleted

## 2015-07-27 LAB — TESTOSTERONE: Testosterone: 201 ng/dL — ABNORMAL LOW (ref 348–1197)

## 2015-07-27 NOTE — Telephone Encounter (Signed)
-----   Message from Nori Riis, PA-C sent at 07/27/2015  8:17 AM EDT ----- Patient's testosterone is low.  He is having another Testopel in September.

## 2015-07-27 NOTE — Telephone Encounter (Signed)
Left a message on the patient's vm relaying the results of their recent labs.  I also reviewed pt next appointment information.  Contact information was also given so that the patient may call back if they have any questions.

## 2015-08-07 ENCOUNTER — Telehealth: Payer: Self-pay | Admitting: *Deleted

## 2015-08-07 NOTE — Telephone Encounter (Signed)
Tried to contact patient regarding his Testopel. Patient called and spoke to Sharyn Lull and stated the insurance denied his Testopel insertion from June. I need to talk to him to see if he currently has filled out a new Testopel form.

## 2015-08-08 NOTE — Telephone Encounter (Signed)
Patient called back to say he had filled out a new testopel paper Aug 5th and he received paper work from his insurance stating he has no coverage left for procedure. Patient states also received a bill reference from insurance for [phone]. Patient has called ins co and is awaiting a call back to see what is going on. Patient to contact me back when he has a answer to this problem. Will get lisa to try to find forms in the server.

## 2015-08-10 ENCOUNTER — Telehealth: Payer: Self-pay | Admitting: *Deleted

## 2015-08-10 NOTE — Telephone Encounter (Signed)
Minus Liberty spoke to Patient who wanted a return call. I called patient back and he wants me to scan and email him the Testopel approval forms from his Insurance company for 2016 and 2015. The insurance company is stating that Testopel is experimental and he has no benefits. I gave all the approval letters to [facility] to scan and email to him at [email or web address]. Patient states he will keep [name] updated with what goes on. Patient still doesn't want [name] to cancel his appointment in September for testopel. We do need to make sure his approval is in place before this procedure.

## 2015-08-17 ENCOUNTER — Telehealth: Payer: Self-pay | Admitting: *Deleted

## 2015-08-17 NOTE — Telephone Encounter (Signed)
Spoke with patient about the medical release forms he came by the office and filled out yesterday. Patient wrote in our practice name in the blank provided for where we wanted the records from. Informed patient of the mistake that was made and he confirmed that I could just draw a line through it and gave me the names of the practices he needed the records from. (patient had filled out 2 releases) [facility] and [facility]. Fixed forms and faxed them out.

## 2015-08-30 ENCOUNTER — Ambulatory Visit (INDEPENDENT_AMBULATORY_CARE_PROVIDER_SITE_OTHER): Payer: 59 | Admitting: Neurology

## 2015-08-30 ENCOUNTER — Encounter: Payer: Self-pay | Admitting: Neurology

## 2015-08-30 VITALS — BP 140/88 | HR 92 | Ht 74.0 in | Wt 202.0 lb

## 2015-08-30 DIAGNOSIS — R208 Other disturbances of skin sensation: Secondary | ICD-10-CM | POA: Diagnosis not present

## 2015-08-30 DIAGNOSIS — R200 Anesthesia of skin: Secondary | ICD-10-CM

## 2015-08-30 DIAGNOSIS — R202 Paresthesia of skin: Secondary | ICD-10-CM

## 2015-08-30 LAB — VITAMIN B12: VITAMIN B 12: 496 pg/mL (ref 211–911)

## 2015-08-30 LAB — TSH: TSH: 1.259 u[IU]/mL (ref 0.350–4.500)

## 2015-08-30 NOTE — Progress Notes (Signed)
[facility] Clinic Note - Initial Visit   Date: 08/30/2015  Daniel Calderon MRN: [id number] DOB: 1966-08-13   Dear Daniel Gell, NP:  Thank you for your kind referral of Daniel Calderon for consultation of bilateral feet paresthesias. Although his history is well known to you, please allow Daniel Calderon to reiterate it for the purpose of our medical record. The patient was accompanied to the clinic by self.    History of Present Illness: Daniel Calderon is a 49 y.o. right-handed Caucasian male with hypogonadism, OSA on CPAP, GERD, depression, left thoracic outlet syndrome s/p surgery, and chronic fatigue presenting for evaluation of bilateral feet numbness and right leg burning.   He reports having left thoracic outlet syndrome and underwent surgery in 2011 at the [place] [facility] [place].  Post-op, he developed severe pain and underwent nerve blocks.  He has residual numbness over the left scalp and had another 6 nerve blocks in his upper back.  Several months ~2012, he developed headaches and due to concern of low pressure headaches, lumbar puncture was ordered.  He states that he was "punctured" nine times in the back and ultimately went to LP under radiology which returned normal.  He has chronic low back pain since this time and feels as though he has a lot of nerve damage.  Starting around 2014, he noticed intermittent burning sensation over the right lateral knee down into his lower leg and dorsum of the foot. No identifiable triggers or alleviating factors, such as exercise or rest.  Sometimes the pain will last a few hours, other times, it may be present for 2-3 days.  There is no associated weakness or similar burning pain on the left leg.  He also complains about constant numbness of the right 5th toe and left heel and medial toe.  He has tried changing his shoes hoping this would alleviate symptoms, but there has been no improvement.     Out-side paper  records, electronic medical record, and images have been reviewed where available and summarized as:  Labs 04/02/2015:  Glucose 87, Cr 1.0, K 5.0, Na 141  CTA chest 02/12/2005: Significant change in the size and configuration of the thoracic outlet on the left when the patient's left arm is raised above his head compared to when the left arm is by his side. The diameter of the space with his arm by his side is 3.2 cm, then decreases to 6 to 7 mm with the left arm above head. Findings are compatible with thoracic outlet syndrome. I suspect venous compression as well.  Past Medical History  Diagnosis Date  . Allergy     Seasonal  . Anxiety   . Depression   . Sleep apnea     Currently uses cpap  . Kidney stones     History of kidney stones  . Migraine   . Frequent headaches   . Hypogonadism in male   . Failure of erection   . Benign enlargement of prostate   . Elevated transaminase level   . Fatigue   . Elevated BP     Past Surgical History  Procedure Laterality Date  . Left shoulder surgery  2011  . Scalene node biopsy / excision    . Nose surgery    . Nerve block      2 in neck. 5 in back.  . Vasectomy       Medications:  Outpatient Encounter Prescriptions as of 08/30/2015  Medication Sig Note  .  fluticasone (FLONASE) 50 MCG/ACT nasal spray Place 2 sprays into both nostrils daily.   Marland Kitchen NUVIGIL 250 MG tablet TAKE 1 TABLET BY MOUTH DAILY   . omeprazole (PRILOSEC) 20 MG capsule Take 1 capsule (20 mg total) by mouth daily.   . sertraline (ZOLOFT) 100 MG tablet Take 1 tablet (100 mg total) by mouth daily.   . tadalafil (CIALIS) 20 MG tablet Take by mouth. 03/24/2015: Received from: [facility]  . Testosterone 75 MG PLLT TESTOPEL, 75MG  (Implant Pellet) - Historical Medication  (75 MG) Active 03/24/2015: Received from: [facility]  . [DISCONTINUED] pregabalin (LYRICA) 25 MG capsule Take 1 capsule (25 mg total) by mouth 3 (three) times daily.    No  facility-administered encounter medications on file as of 08/30/2015.     Allergies:  Allergies  Allergen Reactions  . Erythromycin Nausea And Vomiting  . Cephalexin     Other reaction(s): Other (See Comments) Other Reaction: Other reaction  . Dexamethasone     Other reaction(s): Other (See Comments) Other Reaction: Other reaction  . Oxycodone-Acetaminophen     Other reaction(s): Other (See Comments) Other Reaction: Other reaction  . Prednisone     Other reaction(s): Other (See Comments) Other Reaction: Other reaction  . Gabapentin Other (See Comments)    Aggressive  . Ibuprofen Itching    Family History: Family History  Problem Relation Age of Onset  . Hypertension Mother   . Hearing loss Mother   . Heart disease Other   . Diabetes Brother   . Stroke Paternal Uncle   . Hearing loss Maternal Grandmother     Social History: Social History  Substance Use Topics  . Smoking status: Never Smoker   . Smokeless tobacco: Never Used  . Alcohol Use: 1.2 oz/week    2 Glasses of wine per week   Social History   Social History Narrative   [employer]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    Lives with wife and daughter (52)   High school and tech school   Caffeine- 2-3 coffee    Enjoys- yard work, Location manager- 2 dogs        Review of Systems:  CONSTITUTIONAL: No fevers, chills, night sweats, or weight loss.   EYES: No visual changes or eye pain ENT: No hearing changes.  No history of nose bleeds.   RESPIRATORY: No cough, wheezing and shortness of breath.   CARDIOVASCULAR: Negative for chest pain, and palpitations.   GI: Negative for abdominal discomfort, blood in stools or black stools.  No recent change in bowel habits.   GU:  No history of incontinence.   MUSCLOSKELETAL: +history of joint pain or swelling.  No myalgias.   SKIN: Negative for lesions, rash, and itching.   HEMATOLOGY/ONCOLOGY: Negative for prolonged bleeding, bruising easily, and swollen nodes.  No history of cancer.     ENDOCRINE: Negative for cold or heat intolerance, polydipsia or goiter.   PSYCH:  +depression or anxiety symptoms.   NEURO: As Above.   Vital Signs:  BP 140/88 mmHg  Pulse 92  Ht 6\' 2"  (1.88 m)  Wt 202 lb (91.627 kg)  BMI 25.92 kg/m2  SpO2 97%   General Medical Exam:   General:  Well appearing, comfortable.   Eyes/ENT: see cranial nerve examination.   Neck: No masses appreciated.  Full range of motion without tenderness.  No carotid bruits. Respiratory:  Clear to auscultation, good air entry bilaterally.   Cardiac:  Regular rate and rhythm, no murmur.  Extremities:  No deformities, edema, or skin discoloration.  Skin:  No rashes or lesions.  Neurological Exam: MENTAL STATUS including orientation to time, place, person, recent and remote memory, attention span and concentration, language, and fund of knowledge is normal.  Speech is not dysarthric.  CRANIAL NERVES: II:  No visual field defects.  Unremarkable fundi.   III-IV-VI: Pupils equal round and reactive to light.  Normal conjugate, extra-ocular eye movements in all directions of gaze.  No nystagmus.  No ptosis.   V:  Normal facial sensation.     VII:  Normal facial symmetry and movements.  No pathologic facial reflexes.  VIII:  Normal hearing and vestibular function.   IX-X:  Normal palatal movement.   XI:  Normal shoulder shrug and head rotation.   XII:  Normal tongue strength and range of motion, no deviation or fasciculation.  MOTOR:  No atrophy, fasciculations or abnormal movements.  No pronator drift.  Tone is normal.    Right Upper Extremity:    Left Upper Extremity:    Deltoid  5/5   Deltoid  5/5   Biceps  5/5   Biceps  5/5   Triceps  5/5   Triceps  5/5   Wrist extensors  5/5   Wrist extensors  5/5   Wrist flexors  5/5   Wrist flexors  5/5   Finger extensors  5/5   Finger extensors  5/5   Finger flexors  5/5   Finger flexors  5/5   Dorsal interossei  5/5   Dorsal interossei  5/5   Abductor pollicis  5/5    Abductor pollicis  5/5   Tone (Ashworth scale)  0  Tone (Ashworth scale)  0   Right Lower Extremity:    Left Lower Extremity:    Hip flexors  5/5   Hip flexors  5/5   Hip extensors  5/5   Hip extensors  5/5   Knee flexors  5/5   Knee flexors  5/5   Knee extensors  5/5   Knee extensors  5/5   Dorsiflexors  5/5   Dorsiflexors  5/5   Plantarflexors  5/5   Plantarflexors  5/5   Toe extensors  5/5   Toe extensors  5/5   Toe flexors  5/5   Toe flexors  5/5   Tone (Ashworth scale)  0  Tone (Ashworth scale)  0   MSRs:  Right                                                                 Left brachioradialis 2+  brachioradialis 2+  biceps 2+  biceps 2+  triceps 2+  triceps 2+  patellar 2+  patellar 2+  ankle jerk 2+  ankle jerk 2+  Hoffman no  Hoffman no  plantar response down  plantar response down   SENSORY:  Normal and symmetric perception of light touch, pinprick, vibration, and proprioception.  Romberg's sign absent.   COORDINATION/GAIT: Normal finger-to- nose-finger and heel-to-shin.  Intact rapid alternating movements bilaterally.  Able to rise from a chair without using arms.  Gait narrow based and stable. Tandem and stressed gait intact.    IMPRESSION: 1.  Right leg burning paresthesias over the lower lateral aspect, could possibly follow L5 dermatome.  Exam is non-focal without evidence of weakness or sensory changes.   NCS/EMG ordered to further evaluate.  2.  Bilateral feet numbness, ?early and distal peripheral neuropathy seems less likely in the setting of a normal exam. Will check neuropathy labs as well as EDX to investigate.   PLAN/RECOMMENDATIONS:  1.  Check TSH, vitamin B12, copper 2.  EMG of the right > left leg 3.  Consider MRI lumbar spine going forward 4.  Medication for paresthesias declined  Return to clinic in 2 months.   The duration of this appointment visit was 40 minutes of face-to-face time with the patient.  Greater than 50% of this time was spent in  counseling, explanation of diagnosis, planning of further management, and coordination of care.   Thank you for allowing me to participate in patient's care.  If I can answer any additional questions, I would be pleased to do so.    Sincerely,    Donika K. Posey Pronto, DO

## 2015-08-30 NOTE — Patient Instructions (Signed)
1.  Check blood work 2.  EMG of the legs 3.  Return to clinic in 2 months

## 2015-08-31 ENCOUNTER — Other Ambulatory Visit: Payer: Self-pay | Admitting: Nurse Practitioner

## 2015-08-31 NOTE — Telephone Encounter (Signed)
Last OV 7.5.16, last refill 7.25.16.  Please advise refill

## 2015-09-01 LAB — COPPER, SERUM: COPPER: 94 ug/dL (ref 70–175)

## 2015-09-03 NOTE — Telephone Encounter (Signed)
Left message on VM to return call to schedule appoint. 

## 2015-09-04 ENCOUNTER — Ambulatory Visit (INDEPENDENT_AMBULATORY_CARE_PROVIDER_SITE_OTHER): Payer: 59 | Admitting: Nurse Practitioner

## 2015-09-04 ENCOUNTER — Ambulatory Visit (INDEPENDENT_AMBULATORY_CARE_PROVIDER_SITE_OTHER): Payer: 59 | Admitting: Neurology

## 2015-09-04 ENCOUNTER — Encounter: Payer: Self-pay | Admitting: Nurse Practitioner

## 2015-09-04 VITALS — BP 122/84 | HR 74 | Temp 98.2°F | Resp 18 | Ht 74.0 in | Wt 204.4 lb

## 2015-09-04 DIAGNOSIS — R200 Anesthesia of skin: Secondary | ICD-10-CM

## 2015-09-04 DIAGNOSIS — Z760 Encounter for issue of repeat prescription: Secondary | ICD-10-CM | POA: Diagnosis not present

## 2015-09-04 DIAGNOSIS — R202 Paresthesia of skin: Secondary | ICD-10-CM

## 2015-09-04 DIAGNOSIS — M5417 Radiculopathy, lumbosacral region: Secondary | ICD-10-CM

## 2015-09-04 MED ORDER — NUVIGIL 250 MG PO TABS: 250.0000 mg | ORAL_TABLET | Freq: Every day | ORAL | Status: DC

## 2015-09-04 NOTE — Procedures (Signed)
[facility]  [place], [address]  [place], [place] [address] Tel: [phone] Fax:  [phone] Test Date:  09/04/2015  Patient: Daniel Calderon DOB: [birth year] Physician: Narda Amber, DO  Sex: Male Height: 6\' 2"  Ref Phys: Narda Amber, DO  ID#: [id number] Temp: 33.6C Technician: Jerilynn Mages. Dean   Patient Complaints: This is a 49 year-old gentleman presenting for evaluation of right leg burning paresthesias in the left foot numbness   NCV & EMG Findings: Extensive electrodiagnostic testing of the right lower extremity and additional studies of the left shows:  1. Bilateral sural and superficial peroneal sensory responses are within normal limits. 2. Bilateral tibial and peroneal motor responses are within normal limits. 3. Bilateral H reflex studies are mildly prolonged. 4. Sparse chronic motor axon loss changes are seen affecting the left S1 myotomes, without accompanied active denervation. Similar findings are not present in the right lower extremity.  Impression: 1. Chronic S1 radiculopathy affecting the left lower extremity, very mild in degree electrically. 2. There is no evidence of a sensorimotor polyneuropathy affecting the lower extremities.   ___________________________ Narda Amber, DO    Nerve Conduction Studies Anti Sensory Summary Table   Site NR Peak (ms) Norm Peak (ms) P-T Amp (V) Norm P-T Amp  Left Sup Peroneal Anti Sensory (Ant Lat Mall)  Site 2    3.8  6.2   Right Sup Peroneal Anti Sensory (Ant Lat Mall)  12 cm    4.1 <4.5 5.5 >5  Left Sural Anti Sensory (Lat Mall)  Calf    4.5 <4.5 9.2 >5  Right Sural Anti Sensory (Lat Mall)  Calf    4.1 <4.5 8.2 >5   Motor Summary Table   Site NR Onset (ms) Norm Onset (ms) O-P Amp (mV) Norm O-P Amp Site1 Site2 Delta-0 (ms) Dist (cm) Vel (m/s) Norm Vel (m/s)  Left Peroneal Motor (Ext Dig Brev)  Ankle    4.5 <5.5 6.2 >3 B Fib Ankle 8.1 38.0 47 >40  B Fib    12.6  6.2  Poplt B Fib 2.0 10.0 50 >40  Poplt     14.6  5.9         Right Peroneal Motor (Ext Dig Brev)  Ankle    4.1 <5.5 5.5 >3 B Fib Ankle 8.1 37.0 46 >40  B Fib    12.2  4.5  Poplt B Fib 2.0 10.0 50 >40  Poplt    14.2  4.0         Left Tibial Motor (Abd Hall Brev)  Ankle    3.8 <6.0 14.3 >8 Knee Ankle 10.4 44.0 42 >40  Knee    14.2  10.0         Right Tibial Motor (Abd Hall Brev)  Ankle    3.6 <6.0 9.7 >8 Knee Ankle 10.2 44.0 43 >40  Knee    13.8  6.4          F Wave Studies   NR F-Lat (ms) Lat Norm (ms) L-R F-Lat (ms)  Left Tibial (Mrkrs) (Abd Hallucis)     55.93 <55 0.00  Right Tibial (Mrkrs) (Abd Hallucis)     55.93 <55 0.00   H Reflex Studies   NR H-Lat (ms) Lat Norm (ms) L-R H-Lat (ms)  Left Tibial (Gastroc)     38.50 <35 0.68  Right Tibial (Gastroc)     39.18 <35 0.68   EMG   Side Muscle Ins Act Fibs Psw Fasc Number Recrt Dur Dur. Amp  Amp. Poly Poly. Comment  Right AntTibialis Nml Nml Nml Nml Nml Nml Nml Nml Nml Nml Nml Nml N/A  Right Gastroc Nml Nml Nml Nml Nml Nml Nml Nml Nml Nml Nml Nml N/A  Right Flex Dig Long Nml Nml Nml Nml Nml Nml Nml Nml Nml Nml Nml Nml N/A  Right GluteusMed Nml Nml Nml Nml Nml Nml Nml Nml Nml Nml Nml Nml N/A  Right RectFemoris Nml Nml Nml Nml Nml Nml Nml Nml Nml Nml Nml Nml N/A  Right BicepsFemS Nml Nml Nml Nml Nml Nml Nml Nml Nml Nml Nml Nml N/A  Left BicepsFemS Nml Nml Nml Nml 1- Mod-R Few 1+ Nml Nml Nml Nml N/A  Left AntTibialis Nml Nml Nml Nml Nml Nml Nml Nml Nml Nml Nml Nml N/A  Left Gastroc Nml Nml Nml Nml 1- Mod-R Few 1+ Nml Nml Nml Nml N/A  Left RectFemoris Nml Nml Nml Nml Nml Nml Nml Nml Nml Nml Nml Nml N/A  Left GluteusMed Nml Nml Nml Nml Nml Nml Nml Nml Nml Nml Nml Nml N/A  Left Flex Dig Long Nml Nml Nml Nml 1- Mod-R Few 1+ Nml Nml Nml Nml N/A      Waveforms:

## 2015-09-04 NOTE — Progress Notes (Signed)
Patient ID: Daniel Calderon, male    DOB: [birth year]  Age: 49 y.o. MRN: [id number]  CC: Medication Refill   HPI Daniel Calderon presents for medication refill.   1) Following up for Nuvigil.  Signed CSC today.  NCCSRS checked for compliance  Takes every morning. Last dose was Thursday.   History Daniel Calderon has a past medical history of Allergy; Anxiety; Depression; Sleep apnea; Kidney stones; Migraine; Frequent headaches; Hypogonadism in male; Failure of erection; Benign enlargement of prostate; Elevated transaminase level; Fatigue; and Elevated BP.   He has past surgical history that includes Left Shoulder Surgery (2011); Scalene node biopsy / excision; Nose surgery; nerve block; and Vasectomy.   His family history includes Diabetes in his brother; Healthy in his daughter, father, and son; Hearing loss in his maternal grandmother and mother; Heart disease in his other; Hypertension in his mother; Stroke in his paternal uncle.He reports that he has never smoked. He has never used smokeless tobacco. He reports that he drinks about 1.2 oz of alcohol per week. He reports that he does not use illicit drugs.  Outpatient Prescriptions Prior to Visit  Medication Sig Dispense Refill  . fluticasone (FLONASE) 50 MCG/ACT nasal spray Place 2 sprays into both nostrils daily. 16 g 2  . omeprazole (PRILOSEC) 20 MG capsule Take 1 capsule (20 mg total) by mouth daily. 90 capsule 1  . sertraline (ZOLOFT) 100 MG tablet Take 1 tablet (100 mg total) by mouth daily. 90 tablet 1  . tadalafil (CIALIS) 20 MG tablet Take by mouth.    . Testosterone 75 MG PLLT TESTOPEL, 75MG  (Implant Pellet) - Historical Medication  (75 MG) Active    . NUVIGIL 250 MG tablet TAKE 1 TABLET BY MOUTH DAILY 30 tablet 0   No facility-administered medications prior to visit.   ROS Review of Systems  Constitutional: Negative for fever, chills, diaphoresis and fatigue.  Eyes: Negative for visual disturbance.  Respiratory: Negative for  chest tightness, shortness of breath and wheezing.   Cardiovascular: Negative for chest pain, palpitations and leg swelling.  Gastrointestinal: Negative for nausea, vomiting and diarrhea.  Neurological: Negative for dizziness, weakness and numbness.   Objective:  BP 122/84 mmHg  Pulse 74  Temp(Src) 98.2 F (36.8 C)  Resp 18  Ht 6\' 2"  (1.88 m)  Wt 204 lb 6.4 oz (92.715 kg)  BMI 26.23 kg/m2  SpO2 97%  Physical Exam  Constitutional: He is oriented to person, place, and time. He appears well-developed and well-nourished. No distress.  HENT:  Head: Normocephalic and atraumatic.  Right Ear: External ear normal.  Left Ear: External ear normal.  Cardiovascular: Normal rate, regular rhythm, normal heart sounds and intact distal pulses.  Exam reveals no gallop and no friction rub.   No murmur heard. Pulmonary/Chest: Effort normal and breath sounds normal. No respiratory distress. He has no wheezes. He has no rales. He exhibits no tenderness.  Neurological: He is alert and oriented to person, place, and time.  Skin: Skin is warm and dry. No rash noted. He is not diaphoretic.  Psychiatric: He has a normal mood and affect. His behavior is normal. Judgment and thought content normal.   Assessment & Plan:   Daniel Calderon was seen today for medication refill.  Diagnoses and all orders for this visit:  Medication refill  Other orders -     NUVIGIL 250 MG tablet; Take 1 tablet (250 mg total) by mouth daily.  I have changed Daniel Calderon's NUVIGIL. I am also having him maintain  his tadalafil, Testosterone, fluticasone, omeprazole, and sertraline.  Meds ordered this encounter  Medications  . NUVIGIL 250 MG tablet    Sig: Take 1 tablet (250 mg total) by mouth daily.    Dispense:  30 tablet    Refill:  2    Order Specific Question:  Supervising Provider    Answer:  Crecencio Mc [[id number]]     Follow-up: Return if symptoms worsen or fail to improve.

## 2015-09-04 NOTE — Patient Instructions (Addendum)
Follow up in 3 months

## 2015-09-04 NOTE — Progress Notes (Signed)
Pre visit review using our clinic review tool, if applicable. No additional management support is needed unless otherwise documented below in the visit note. 

## 2015-09-07 ENCOUNTER — Encounter: Payer: Self-pay | Admitting: Urology

## 2015-09-07 ENCOUNTER — Ambulatory Visit (INDEPENDENT_AMBULATORY_CARE_PROVIDER_SITE_OTHER): Payer: 59 | Admitting: Urology

## 2015-09-07 VITALS — BP 144/90 | HR 64 | Ht 74.0 in | Wt 198.7 lb

## 2015-09-07 DIAGNOSIS — E291 Testicular hypofunction: Secondary | ICD-10-CM | POA: Diagnosis not present

## 2015-09-07 MED ORDER — LIDOCAINE-EPINEPHRINE 1 %-1:100000 IJ SOLN
10.0000 mL | Freq: Once | INTRAMUSCULAR | Status: AC
  Administered 2015-09-07: 10 mL via INTRADERMAL

## 2015-09-07 MED ORDER — TESTOSTERONE 75 MG IL PLLT
75.0000 mg | PELLET | Freq: Once | Status: AC
  Administered 2015-09-07: 75 mg

## 2015-09-07 NOTE — Progress Notes (Signed)
This is a 49 -year-old male with hypogonadism and he is managed with Testopel. He presents today for Testopel insertion.  Patient is placed on the exam table in the right lateral jackknife position.  Identified upper outer quadrant of hip for insertion; prepped area with Betadine and injected 10 cc's of Lidocaine 1% with Epinephrine to anesthetize superficially and distally along trocar tract.  Made 3 mm incision using 15 blade of scalpel; trocar with sharp ended stylet was inserted into subcutaneous tissue in line with femur. Sharp stylet was withdrawn and 6 pellets were placed into trocar well. Testopel pellets advanced into tissue using blunt ended stylet. Trocar removed and incision closed using 6 Steri-Strips. Cleansed area to remove Betadine and covered Steri-Strips with outer Band-Aid.  Careful inspection of insertion is done and patient informed of post procedure instructions.  He will return in three month for PSA, HCT, DRE and serum testosterone before 9:00am.

## 2015-09-13 ENCOUNTER — Encounter: Payer: Self-pay | Admitting: Nurse Practitioner

## 2015-09-13 DIAGNOSIS — Z760 Encounter for issue of repeat prescription: Secondary | ICD-10-CM | POA: Insufficient documentation

## 2015-09-13 NOTE — Assessment & Plan Note (Signed)
Nuvigil filled for 3 months. CSC signed. NCCSRS checked for compliance.

## 2015-09-18 ENCOUNTER — Encounter: Payer: 59 | Admitting: Neurology

## 2015-10-26 ENCOUNTER — Ambulatory Visit: Payer: 59 | Admitting: Neurology

## 2015-12-03 ENCOUNTER — Encounter: Payer: Self-pay | Admitting: Urology

## 2015-12-03 ENCOUNTER — Ambulatory Visit (INDEPENDENT_AMBULATORY_CARE_PROVIDER_SITE_OTHER): Payer: 59 | Admitting: Urology

## 2015-12-03 VITALS — Ht 74.0 in | Wt 207.0 lb

## 2015-12-03 DIAGNOSIS — E291 Testicular hypofunction: Secondary | ICD-10-CM | POA: Diagnosis not present

## 2015-12-03 DIAGNOSIS — N401 Enlarged prostate with lower urinary tract symptoms: Secondary | ICD-10-CM | POA: Diagnosis not present

## 2015-12-03 DIAGNOSIS — N138 Other obstructive and reflux uropathy: Secondary | ICD-10-CM | POA: Insufficient documentation

## 2015-12-03 NOTE — Progress Notes (Signed)
12/03/2015 3:20 PM   Daniel Calderon April 06, 1966 [id number]  Referring provider: Rubbie Battiest, NP [id number] [place], [place] [address]  Chief Complaint  Patient presents with  . Hypogonadism    follow up    HPI: Patient is a 49 year old white male with hypogonadism and BPH with LUTS who presents today for his 6 month follow up.  Hypogonadism Patient presented with the symptoms of reduced libido, erectile dysfunction, a reduced incidence of spontaneous erections, a decrease in physical and work performance, disturbances in sleep patterns, decreased energy and motivation, a decrease in cognitive function and mood changes. This is indicated by his responses to the ADAM questionnaire.  He is currently managing his hypogonadism with Testopel insertions.          Androgen Deficiency in the Aging Male      12/03/15 1500       Androgen Deficiency in the Aging Male   Do you have a decrease in libido (sex drive) Yes     Do you have lack of energy Yes     Do you have a decrease in strength and/or endurance Yes     Have you lost height No     Have you noticed a decreased "enjoyment of life" No     Are you sad and/or grumpy Yes     Are your erections less strong Yes     Have you noticed a recent deterioration in your ability to play sports Yes     Are you falling asleep after dinner Yes     Has there been a recent deterioration in your work performance Yes       BPH WITH LUTS His IPSS score today is 7, which is mild lower urinary tract symptomatology. He is pleased with his quality life due to his urinary symptoms.  He denies any dysuria, hematuria or suprapubic pain.  He also denies any recent fevers, chills, nausea or vomiting.  He does not have a family history of PCa.      IPSS      12/03/15 1500       International Prostate Symptom Score   How often have you had the sensation of not emptying your bladder? Not at All     How often have you had to  urinate less than every two hours? About half the time     How often have you found you stopped and started again several times when you urinated? Less than 1 in 5 times     How often have you found it difficult to postpone urination? Less than 1 in 5 times     How often have you had a weak urinary stream? Less than 1 in 5 times     How often have you had to strain to start urination? Not at All     How many times did you typically get up at night to urinate? 1 Time     Total IPSS Score 7     Quality of Life due to urinary symptoms   If you were to spend the rest of your life with your urinary condition just the way it is now how would you feel about that? Delighted        Score:  1-7 Mild 8-19 Moderate 20-35 Severe      PMH: Past Medical History  Diagnosis Date  . Allergy     Seasonal  . Anxiety   . Depression   .  Sleep apnea     Currently uses cpap  . Kidney stones     History of kidney stones  . Migraine   . Frequent headaches   . Hypogonadism in male   . Failure of erection   . Benign enlargement of prostate   . Elevated transaminase level   . Fatigue   . Elevated BP     Surgical History: Past Surgical History  Procedure Laterality Date  . Left shoulder surgery  2011  . Scalene node biopsy / excision    . Nose surgery    . Nerve block      2 in neck. 5 in back.  . Vasectomy      Home Medications:    Medication List       This list is accurate as of: 12/03/15  3:20 PM.  Always use your most recent med list.               fluticasone 50 MCG/ACT nasal spray  Commonly known as:  FLONASE  Place 2 sprays into both nostrils daily.     NUVIGIL 250 MG tablet  Generic drug:  Armodafinil  Take 1 tablet (250 mg total) by mouth daily.     omeprazole 20 MG capsule  Commonly known as:  PRILOSEC  Take 1 capsule (20 mg total) by mouth daily.     sertraline 100 MG tablet  Commonly known as:  ZOLOFT  Take 1 tablet (100 mg total) by mouth daily.      tadalafil 20 MG tablet  Commonly known as:  CIALIS  Take by mouth.     Testosterone 75 MG Pllt  TESTOPEL, 75MG  (Implant Pellet) - Historical Medication  (75 MG) Active     Testosterone Propionate Powd  Apply topically.        Allergies:  Allergies  Allergen Reactions  . Erythromycin Nausea And Vomiting  . Cephalexin     Other reaction(s): Other (See Comments) Other Reaction: Other reaction  . Dexamethasone     Other reaction(s): Other (See Comments) Other Reaction: Other reaction  . Oxycodone-Acetaminophen     Other reaction(s): Other (See Comments) Other Reaction: Other reaction  . Prednisone     Other reaction(s): Other (See Comments) Other Reaction: Other reaction  . Gabapentin Other (See Comments)    Aggressive  . Ibuprofen Itching    Family History: Family History  Problem Relation Age of Onset  . Hypertension Mother     Living  . Hearing loss Mother   . Heart disease Other   . Diabetes Brother   . Stroke Paternal Uncle   . Hearing loss Maternal Grandmother   . Healthy Father     Living  . Healthy Son   . Healthy Daughter     Social History:  reports that he has never smoked. He has never used smokeless tobacco. He reports that he drinks about 1.2 oz of alcohol per week. He reports that he does not use illicit drugs.  ROS: UROLOGY Frequent Urination?: No Hard to postpone urination?: No Burning/pain with urination?: No Get up at night to urinate?: No Leakage of urine?: No Urine stream starts and stops?: No Trouble starting stream?: No Do you have to strain to urinate?: No Blood in urine?: No Urinary tract infection?: No Sexually transmitted disease?: No Injury to kidneys or bladder?: No Painful intercourse?: No Weak stream?: No Erection problems?: No Penile pain?: No  Gastrointestinal Nausea?: No Vomiting?: No Indigestion/heartburn?: No Diarrhea?: No Constipation?: No  Constitutional  Fever: No Night sweats?: Yes Weight loss?:  No Fatigue?: Yes  Skin Skin rash/lesions?: No Itching?: No  Eyes Blurred vision?: No Double vision?: No  Ears/Nose/Throat Sore throat?: No Sinus problems?: No  Hematologic/Lymphatic Swollen glands?: No Easy bruising?: No  Cardiovascular Leg swelling?: No Chest pain?: Yes  Respiratory Cough?: No Shortness of breath?: No  Endocrine Excessive thirst?: No  Musculoskeletal Back pain?: No Joint pain?: No  Neurological Headaches?: No Dizziness?: No  Psychologic Depression?: No Anxiety?: No  Physical Exam: Ht 6\' 2"  (1.88 m)  Wt 207 lb (93.895 kg)  BMI 26.57 kg/m2  GU: No CVA tenderness.  No bladder fullness or masses.  Patient with circumcised phallus.  Urethral meatus is patent.  No penile discharge. No penile lesions or rashes. Scrotum without lesions, cysts, rashes and/or edema.  Testicles are located scrotally bilaterally. No masses are appreciated in the testicles. Left and right epididymis are normal. Rectal: Patient with  normal sphincter tone. Anus and perineum without scarring or rashes. No rectal masses are appreciated. Prostate is approximately 50 grams, no nodules are appreciated. Seminal vesicles are normal.  Laboratory Data: Lab Results  Component Value Date   WBC 7.2 04/02/2015   HCT 44.8 05/24/2015   PLT 211 04/02/2015    Lab Results  Component Value Date   CREATININE 1.0 04/02/2015    Lab Results  Component Value Date   PSA 0.3 05/24/2015    Lab Results  Component Value Date   TESTOSTERONE 201* 07/26/2015    Assessment & Plan:    1. Hypogonadism in male:   Patient manages his hypogonadism with Testopel insertions every ninety days.  He will RTC next week for his Testopel insertion if his labs return within normal parameters.    - Lipid panel - Hematocrit - PSA - Testosterone  2. BPH with LUTS:   IPSS score is 7/0.  We will continue to monitor.  He will RTC in 6 months for IPSS score, exam and PSA.     Return for keep  appointment on 12/23.  Zara Council, [facility] [address] [place], [place] [address] [phone]

## 2015-12-04 LAB — PSA: Prostate Specific Ag, Serum: 0.2 ng/mL (ref 0.0–4.0)

## 2015-12-04 LAB — LIPID PANEL
CHOLESTEROL TOTAL: 195 mg/dL (ref 100–199)
Chol/HDL Ratio: 5.1 ratio units — ABNORMAL HIGH (ref 0.0–5.0)
HDL: 38 mg/dL — AB (ref >39)
LDL Calculated: 104 mg/dL — ABNORMAL HIGH (ref 0–99)
TRIGLYCERIDES: 263 mg/dL — AB (ref 0–149)
VLDL CHOLESTEROL CAL: 53 mg/dL — AB (ref 5–40)

## 2015-12-04 LAB — TESTOSTERONE: Testosterone: 152 ng/dL — ABNORMAL LOW (ref 348–1197)

## 2015-12-04 LAB — HEMATOCRIT: Hematocrit: 38.1 % (ref 37.5–51.0)

## 2015-12-05 ENCOUNTER — Telehealth: Payer: Self-pay

## 2015-12-05 NOTE — Telephone Encounter (Signed)
-----   Message from Nori Riis, PA-C sent at 12/04/2015  5:16 PM EST ----- Patient is at average risk for heart disease.  He may want to discuss this further with his PCP.

## 2015-12-05 NOTE — Telephone Encounter (Signed)
LMOM- average risk for heart disease and may wan to discuss further with PCP.

## 2015-12-10 ENCOUNTER — Other Ambulatory Visit: Payer: Self-pay | Admitting: Nurse Practitioner

## 2015-12-10 ENCOUNTER — Telehealth: Payer: Self-pay | Admitting: Nurse Practitioner

## 2015-12-10 NOTE — Telephone Encounter (Signed)
My RN will fax it to his pharmacy. Just received the Rx request.

## 2015-12-10 NOTE — Telephone Encounter (Signed)
Ok to refill 

## 2015-12-10 NOTE — Telephone Encounter (Signed)
Pt came into office wanting to know if he can have his NUVIGIL 250 MG tablet. Please call pt. Pt is out of medication.  Thank you.

## 2015-12-10 NOTE — Telephone Encounter (Signed)
armondadinil printed script faxed to [place]

## 2015-12-14 ENCOUNTER — Encounter: Payer: Self-pay | Admitting: Urology

## 2015-12-14 ENCOUNTER — Ambulatory Visit (INDEPENDENT_AMBULATORY_CARE_PROVIDER_SITE_OTHER): Payer: 59 | Admitting: Urology

## 2015-12-14 ENCOUNTER — Other Ambulatory Visit: Payer: Self-pay | Admitting: Nurse Practitioner

## 2015-12-14 VITALS — BP 146/88 | HR 72 | Ht 74.0 in | Wt 204.2 lb

## 2015-12-14 DIAGNOSIS — E291 Testicular hypofunction: Secondary | ICD-10-CM

## 2015-12-14 MED ORDER — TESTOSTERONE 75 MG IL PLLT
75.0000 mg | PELLET | Freq: Once | Status: AC
  Administered 2015-12-14: 75 mg

## 2015-12-14 NOTE — Progress Notes (Signed)
This is a 49 -year-old male with hypogonadism and he is managed with Testopel. He presents today for Testopel insertion.  Patient is placed on the exam table in the left lateral jackknife position.  Identified upper outer quadrant of hip for insertion; prepped area with Betadine and injected 10 cc's of Lidocaine 1% with Epinephrine to anesthetize superficially and distally along trocar tract.  Made 3 mm incision using 15 blade of scalpel; trocar with sharp ended stylet was inserted into subcutaneous tissue in line with femur. Sharp stylet was withdrawn and 6 pellets were placed into trocar well. Testopel pellets advanced into tissue using blunt ended stylet. Trocar removed and incision closed using 6 Steri-Strips. Cleansed area to remove Betadine and covered Steri-Strips with outer Band-Aid.  Careful inspection of insertion is done and patient informed of post procedure instructions.  He will return in three month for a HCT and  serum testosterone before 9:00am.

## 2015-12-27 ENCOUNTER — Emergency Department: Payer: PRIVATE HEALTH INSURANCE

## 2015-12-27 ENCOUNTER — Emergency Department
Admission: EM | Admit: 2015-12-27 | Discharge: 2015-12-27 | Disposition: A | Discharge status: Home / Self Care | Payer: PRIVATE HEALTH INSURANCE | Attending: Emergency Medicine | Admitting: Emergency Medicine

## 2015-12-27 DIAGNOSIS — S93401A Sprain of unspecified ligament of right ankle, initial encounter: Secondary | ICD-10-CM | POA: Diagnosis not present

## 2015-12-27 DIAGNOSIS — S99911A Unspecified injury of right ankle, initial encounter: Secondary | ICD-10-CM | POA: Diagnosis present

## 2015-12-27 DIAGNOSIS — Y9289 Other specified places as the place of occurrence of the external cause: Secondary | ICD-10-CM | POA: Diagnosis not present

## 2015-12-27 DIAGNOSIS — W108XXA Fall (on) (from) other stairs and steps, initial encounter: Secondary | ICD-10-CM | POA: Insufficient documentation

## 2015-12-27 DIAGNOSIS — Y9301 Activity, walking, marching and hiking: Secondary | ICD-10-CM | POA: Diagnosis not present

## 2015-12-27 DIAGNOSIS — Z79899 Other long term (current) drug therapy: Secondary | ICD-10-CM | POA: Insufficient documentation

## 2015-12-27 DIAGNOSIS — Y998 Other external cause status: Secondary | ICD-10-CM | POA: Diagnosis not present

## 2015-12-27 MED ORDER — NAPROXEN 500 MG PO TABS
500.0000 mg | ORAL_TABLET | Freq: Once | ORAL | Status: AC
  Administered 2015-12-27: 500 mg via ORAL
  Filled 2015-12-27: qty 1

## 2015-12-27 NOTE — ED Provider Notes (Signed)
[facility] Emergency Department Provider Note  ____________________________________________  Time seen: Approximately 8:59 PM  I have reviewed the triage vital signs and the nursing notes.   HISTORY  Chief Complaint Ankle Injury   HPI Daniel Calderon is a 50 y.o. male who presents to the emergency department for evaluation of ankle pain. He states that while walking down the stairs, his right ankle twisted causing him to slide down 3 steps. He was wearing high work boots when the injury occurred. He states that the right ankle was extremely painful when he attempted to bear weight. He denies other injury.   Past Medical History  Diagnosis Date  . Allergy     Seasonal  . Anxiety   . Depression   . Sleep apnea     Currently uses cpap  . Kidney stones     History of kidney stones  . Migraine   . Frequent headaches   . Hypogonadism in male   . Failure of erection   . Benign enlargement of prostate   . Elevated transaminase level   . Fatigue   . Elevated BP     Patient Active Problem List   Diagnosis Date Noted  . BPH with obstruction/lower urinary tract symptoms 12/03/2015  . Medication refill 09/13/2015  . Erectile disorder, generalized, mild 07/26/2015  . Hypogonadism in male 05/24/2015  . Environmental allergies 04/13/2015  . Encounter to establish care 04/13/2015  . Sleep apnea 04/13/2015  . Migraines 04/13/2015  . Neck pain 04/13/2015  . Adjustment disorder with mixed anxiety and depressed mood 04/13/2015  . Psoriasis 04/13/2015  . History of kidney stones 04/13/2015  . Neuropathy ([place]) 04/13/2015  . Benign fibroma of prostate 03/24/2015  . Blood pressure elevated 03/24/2015  . Elevation of level of transaminase or lactic acid dehydrogenase (LDH) 03/24/2015  . Failure of erection 03/24/2015  . Fatigue 03/24/2015  . 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 Left shoulder surgery  2011  .  Scalene node biopsy / excision    . Nose surgery    . Nerve block      2 in neck. 5 in back.  . Vasectomy      Current Outpatient Rx  Name  Route  Sig  Dispense  Refill  . Armodafinil 250 MG tablet      TAKE 1 TABLET BY MOUTH ONCE DAILY   30 tablet   2   . fluticasone (FLONASE) 50 MCG/ACT nasal spray   Each Nare   Place 2 sprays into both nostrils daily.   16 g   2   . omeprazole (PRILOSEC) 20 MG capsule      TAKE 1 CAPSULE (20 MG TOTAL) BY MOUTH DAILY.   90 capsule   2   . sertraline (ZOLOFT) 100 MG tablet   Oral   Take 1 tablet (100 mg total) by mouth daily.   90 tablet   1   . tadalafil (CIALIS) 20 MG tablet   Oral   Take by mouth.         . Testosterone 75 MG PLLT      TESTOPEL, 75MG  (Implant Pellet) - Historical Medication  (75 MG) Active         . Testosterone Propionate POWD   Topical   Apply topically. Reported on 12/14/2015           Allergies Erythromycin; Cephalexin; Dexamethasone; Oxycodone-acetaminophen; Prednisone; Gabapentin; and Ibuprofen  Family History  Problem  Relation Age of Onset  . Hypertension Mother     Living  . Hearing loss Mother   . Heart disease Other   . Diabetes Brother   . Stroke Paternal Uncle   . Hearing loss Maternal Grandmother   . Healthy Father     Living  . Healthy Son   . Healthy Daughter     Social History Social History  Substance Use Topics  . Smoking status: Never Smoker   . Smokeless tobacco: Never Used  . Alcohol Use: 1.2 oz/week    2 Glasses of wine per week     Comment: 4 beers per week, during his 2010-2011 daily 2 glasses wine     Review of Systems Constitutional: No recent illness. Eyes: No visual changes. ENT: No sore throat. Cardiovascular: Denies chest pain or palpitations. Respiratory: Denies shortness of breath. Gastrointestinal: No abdominal pain.  Genitourinary: Negative for dysuria. Musculoskeletal: Pain in right ankle Skin: Negative for rash. Neurological: Negative  for headaches, focal weakness or numbness. 10-point ROS otherwise negative.  ____________________________________________   PHYSICAL EXAM:  VITAL SIGNS: ED Triage Vitals  Enc Vitals Group     BP 12/27/15 2032 161/104 mmHg     Pulse Rate 12/27/15 2032 89     Resp 12/27/15 2032 17     Temp 12/27/15 2032 98.3 F (36.8 C)     Temp Source 12/27/15 2032 Oral     SpO2 12/27/15 2032 98 %     Weight 12/27/15 2032 195 lb (88.451 kg)     Height 12/27/15 2032 6\' 2"  (1.88 m)     Head Cir --      Peak Flow --      Pain Score 12/27/15 2033 3     Pain Loc --      Pain Edu? --      Excl. in [place]? --     Constitutional: Alert and oriented. Well appearing and in no acute distress. Eyes: Conjunctivae are normal. EOMI. Head: Atraumatic. Nose: No congestion/rhinnorhea. Neck: No stridor.  Respiratory: Normal respiratory effort.   Musculoskeletal: ATFL pattern edema noted to the right ankle. [place] ankle rules are negative  Neurologic:  Normal speech and language. No gross focal neurologic deficits are appreciated. Speech is normal. No gait instability. Skin:  Skin is warm, dry and intact. Atraumatic. Psychiatric: Mood and affect are normal. Speech and behavior are normal.  ____________________________________________   LABS (all labs ordered are listed, but only abnormal results are displayed)  Labs Reviewed - No data to display ____________________________________________  RADIOLOGY  no acute bony abnormality per radiology. ____________________________________________   PROCEDURES  Procedure(s) performed:   Velcro ankle stirrup splint applied to the right ankle by ER tech. Patient was neurovascularly intact post-application. Crutches were given with crutch training.    ____________________________________________   INITIAL IMPRESSION / ASSESSMENT AND PLAN / ED COURSE  Pertinent labs & imaging results that were available during my care of the patient were reviewed by me and  considered in my medical decision making (see chart for details).  Patient was advised to follow-up with orthopedics for symptoms that are not improving over the week. He was advised to take Naprosyn twice a day if needed. He was advised to rest ice and elevate the ankle for the next few days. He was advised to return to the emergency department for symptoms that change or worsen if he is unable to schedule an appointment. ____________________________________________   FINAL CLINICAL IMPRESSION(S) / ED DIAGNOSES  Final diagnoses:  Ankle sprain, right, initial encounter       Victorino Dike, FNP 12/27/15 2316  Carrie Mew, MD 12/27/15 [phone]

## 2015-12-27 NOTE — Discharge Instructions (Signed)
Ankle Sprain  An ankle sprain is an injury to the strong, fibrous tissues (ligaments) that hold the bones of your ankle joint together.   CAUSES  An ankle sprain is usually caused by a fall or by twisting your ankle. Ankle sprains most commonly occur when you step on the outer edge of your foot, and your ankle turns inward. People who participate in sports are more prone to these types of injuries.   SYMPTOMS    Pain in your ankle. The pain may be present at rest or only when you are trying to stand or walk.   Swelling.   Bruising. Bruising may develop immediately or within 1 to 2 days after your injury.   Difficulty standing or walking, particularly when turning corners or changing directions.  DIAGNOSIS   Your caregiver will ask you details about your injury and perform a physical exam of your ankle to determine if you have an ankle sprain. During the physical exam, your caregiver will press on and apply pressure to specific areas of your foot and ankle. Your caregiver will try to move your ankle in certain ways. An X-ray exam may be done to be sure a bone was not broken or a ligament did not separate from one of the bones in your ankle (avulsion fracture).   TREATMENT   Certain types of braces can help stabilize your ankle. Your caregiver can make a recommendation for this. Your caregiver may recommend the use of medicine for pain. If your sprain is severe, your caregiver may refer you to a surgeon who helps to restore function to parts of your skeletal system (orthopedist) or a physical therapist.  HOME CARE INSTRUCTIONS    Apply ice to your injury for 1-2 days or as directed by your caregiver. Applying ice helps to reduce inflammation and pain.    Put ice in a plastic bag.    Place a towel between your skin and the bag.    Leave the ice on for 15-20 minutes at a time, every 2 hours while you are awake.   Only take over-the-counter or prescription medicines for pain, discomfort, or fever as directed by  your caregiver.   Elevate your injured ankle above the level of your heart as much as possible for 2-3 days.   If your caregiver recommends crutches, use them as instructed. Gradually put weight on the affected ankle. Continue to use crutches or a cane until you can walk without feeling pain in your ankle.   If you have a plaster splint, wear the splint as directed by your caregiver. Do not rest it on anything harder than a pillow for the first 24 hours. Do not put weight on it. Do not get it wet. You may take it off to take a shower or bath.   You may have been given an elastic bandage to wear around your ankle to provide support. If the elastic bandage is too tight (you have numbness or tingling in your foot or your foot becomes cold and blue), adjust the bandage to make it comfortable.   If you have an air splint, you may blow more air into it or let air out to make it more comfortable. You may take your splint off at night and before taking a shower or bath. Wiggle your toes in the splint several times per day to decrease swelling.  SEEK MEDICAL CARE IF:    You have rapidly increasing bruising or swelling.   Your toes feel   extremely cold or you lose feeling in your foot.   Your pain is not relieved with medicine.  SEEK IMMEDIATE MEDICAL CARE IF:   Your toes are numb or blue.   You have severe pain that is increasing.  MAKE SURE YOU:    Understand these instructions.   Will watch your condition.   Will get help right away if you are not doing well or get worse.     This information is not intended to replace advice given to you by your health care provider. Make sure you discuss any questions you have with your health care provider.     Document Released: 12/08/2005 Document Revised: 12/29/2014 Document Reviewed: 12/20/2011  Elsevier Interactive Patient Education 2016 [employer]

## 2015-12-27 NOTE — ED Notes (Signed)
Pt c/o pain in right ankle after injury at 2015 today. Pt reports walking down stairs when right ankle twisted and pt "slide down 3 steps". Pt reports unable to stand after injury.

## 2015-12-27 NOTE — ED Notes (Signed)
Pt arrived to ED via wheelchair from another department. Pt c/o right ankle pain after fall while walking down steps. Pt states "twisted right ankle while going down 3 steps". + swelling noted Pt unable to stand on right foot after injury.

## 2015-12-31 ENCOUNTER — Encounter: Payer: Self-pay | Admitting: Physician Assistant

## 2015-12-31 ENCOUNTER — Ambulatory Visit: Payer: Worker's Compensation | Admitting: Physician Assistant

## 2015-12-31 VITALS — BP 140/80 | HR 116 | Temp 98.7°F

## 2015-12-31 DIAGNOSIS — J069 Acute upper respiratory infection, unspecified: Secondary | ICD-10-CM

## 2015-12-31 MED ORDER — ALBUTEROL SULFATE HFA 108 (90 BASE) MCG/ACT IN AERS: 2.0000 | INHALATION_SPRAY | Freq: Four times a day (QID) | RESPIRATORY_TRACT | Status: DC | PRN

## 2015-12-31 MED ORDER — AZITHROMYCIN 250 MG PO TABS
ORAL_TABLET | ORAL | Status: DC
Start: 2015-12-31 — End: 2017-02-19

## 2015-12-31 MED ORDER — METHYLPREDNISOLONE 4 MG PO TBPK: ORAL_TABLET | ORAL | Status: DC

## 2015-12-31 NOTE — Progress Notes (Signed)
S: C/o cough and congestion with wheezing and chest pain, chest is sore from coughing, fever, chills, mucus is green, or cough is dry and hacking; keeping pt awake at night;  denies cardiac type chest pain or sob, v/d, abd pain Remainder ros neg, sx for 5 days  O: vitals wnl, nad, tms clear, throat injected, neck supple no lymph, lungs c t a, cv rrr, neuro intact, cough is dry and hacking  A:  Acute bronchitis   P:  rx medication:  Zpack, medrol dose pack, albuterol inhaler, states has taken zpack before and did ok on it, ?reaction with prednisone, they weren't sure if it was the keflex or prednisone that turned him red; pt knows to stop medication immediately if any rash or other allergy sx and take benadryl, go to ER if dif breathing;  use otc meds, tylenol or motrin as needed for fever/chills, return if not better in 3 -5 days, return earlier if worsening

## 2016-01-24 ENCOUNTER — Other Ambulatory Visit: Payer: Self-pay | Admitting: Nurse Practitioner

## 2016-02-23 ENCOUNTER — Emergency Department
Admission: EM | Admit: 2016-02-23 | Discharge: 2016-02-23 | Disposition: A | Discharge status: Home / Self Care | Payer: PRIVATE HEALTH INSURANCE | Attending: Emergency Medicine | Admitting: Emergency Medicine

## 2016-02-23 ENCOUNTER — Emergency Department: Payer: PRIVATE HEALTH INSURANCE

## 2016-02-23 DIAGNOSIS — S0083XA Contusion of other part of head, initial encounter: Secondary | ICD-10-CM | POA: Diagnosis not present

## 2016-02-23 DIAGNOSIS — Z79899 Other long term (current) drug therapy: Secondary | ICD-10-CM | POA: Insufficient documentation

## 2016-02-23 DIAGNOSIS — Y998 Other external cause status: Secondary | ICD-10-CM | POA: Diagnosis not present

## 2016-02-23 DIAGNOSIS — S0033XA Contusion of nose, initial encounter: Secondary | ICD-10-CM | POA: Diagnosis not present

## 2016-02-23 DIAGNOSIS — Y9389 Activity, other specified: Secondary | ICD-10-CM | POA: Insufficient documentation

## 2016-02-23 DIAGNOSIS — G44319 Acute post-traumatic headache, not intractable: Secondary | ICD-10-CM | POA: Diagnosis not present

## 2016-02-23 DIAGNOSIS — W01198A Fall on same level from slipping, tripping and stumbling with subsequent striking against other object, initial encounter: Secondary | ICD-10-CM | POA: Diagnosis not present

## 2016-02-23 DIAGNOSIS — Y9289 Other specified places as the place of occurrence of the external cause: Secondary | ICD-10-CM | POA: Diagnosis not present

## 2016-02-23 DIAGNOSIS — Z792 Long term (current) use of antibiotics: Secondary | ICD-10-CM | POA: Insufficient documentation

## 2016-02-23 DIAGNOSIS — S0993XA Unspecified injury of face, initial encounter: Secondary | ICD-10-CM | POA: Diagnosis present

## 2016-02-23 MED ORDER — NAPROXEN 500 MG PO TABS: 500.0000 mg | ORAL_TABLET | Freq: Two times a day (BID) | ORAL | Status: DC

## 2016-02-23 MED ORDER — ACETAMINOPHEN 325 MG PO TABS
650.0000 mg | ORAL_TABLET | Freq: Once | ORAL | Status: AC
  Administered 2016-02-23: 650 mg via ORAL
  Filled 2016-02-23: qty 2

## 2016-02-23 NOTE — ED Provider Notes (Signed)
[facility] Emergency Department Provider Note  ____________________________________________  Time seen: Approximately 4:29 PM  I have reviewed the triage vital signs and the nursing notes.   HISTORY  Chief Complaint Facial Injury    HPI Daniel Calderon is a 50 y.o. male who presents emergency department for complaint of headache and facial pain. 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 Patient states that he had a "cut" to the bridge of his nose that had a Band-Aid placed on same. Patient is endorsing a global headache, and bilateral facial pain. Patient denies any epistaxis, visual acuity changes, neck pain, difficulty breathing or swallowing. Patient is not taking any medications prior to arrival.   Past Medical History  Diagnosis Date  . Allergy     Seasonal  . Anxiety   . Depression   . Sleep apnea     Currently uses cpap  . Kidney stones     History of kidney stones  . Migraine   . Frequent headaches   . Hypogonadism in male   . Failure of erection   . Benign enlargement of prostate   . Elevated transaminase level   . Fatigue   . Elevated BP     Patient Active Problem List   Diagnosis Date Noted  . BPH with obstruction/lower urinary tract symptoms 12/03/2015  . Medication refill 09/13/2015  . Erectile disorder, generalized, mild 07/26/2015  . Hypogonadism in male 05/24/2015  . Environmental allergies 04/13/2015  . Encounter to establish care 04/13/2015  . Sleep apnea 04/13/2015  . Migraines 04/13/2015  . Neck pain 04/13/2015  . Adjustment disorder with mixed anxiety and depressed mood 04/13/2015  . Psoriasis 04/13/2015  . History of kidney stones 04/13/2015  . Neuropathy ([place]) 04/13/2015  . Benign fibroma of prostate 03/24/2015  . Blood pressure elevated 03/24/2015  . Elevation of level of transaminase or lactic acid  dehydrogenase (LDH) 03/24/2015  . Failure of erection 03/24/2015  . Fatigue 03/24/2015  . 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 Left shoulder surgery  2011  . Scalene node biopsy / excision    . Nose surgery    . Nerve block      2 in neck. 5 in back.  . Vasectomy      Current Outpatient Rx  Name  Route  Sig  Dispense  Refill  . albuterol (PROVENTIL HFA;VENTOLIN HFA) 108 (90 Base) MCG/ACT inhaler   Inhalation   Inhale 2 puffs into the lungs every 6 (six) hours as needed for wheezing or shortness of breath.   1 Inhaler   0   . Armodafinil 250 MG tablet      TAKE 1 TABLET BY MOUTH ONCE DAILY   30 tablet   2   . azithromycin (ZITHROMAX Z-PAK) 250 MG tablet      2 pills today then 1 pill a day for 4 days   6 each   0   . fluticasone (FLONASE) 50 MCG/ACT nasal spray   Each Nare   Place 2 sprays into both nostrils daily.   16 g   2   . methylPREDNISolone (MEDROL DOSEPAK) 4 MG TBPK tablet      Take 6 pills on day one then decrease by 1 pill each day   21 tablet   0   . naproxen (NAPROSYN) 500  MG tablet   Oral   Take 1 tablet (500 mg total) by mouth 2 (two) times daily with a meal.   60 tablet   0   . omeprazole (PRILOSEC) 20 MG capsule      TAKE 1 CAPSULE (20 MG TOTAL) BY MOUTH DAILY.   90 capsule   2   . sertraline (ZOLOFT) 100 MG tablet      TAKE 1 TABLET (100 MG TOTAL) BY MOUTH DAILY.   90 tablet   1   . tadalafil (CIALIS) 20 MG tablet   Oral   Take by mouth.         . Testosterone 75 MG PLLT      TESTOPEL, 75MG  (Implant Pellet) - Historical Medication  (75 MG) Active         . Testosterone Propionate POWD   Topical   Apply topically. Reported on 12/14/2015           Allergies Erythromycin; Cephalexin; Dexamethasone; Oxycodone-acetaminophen; Prednisone; Gabapentin; and Ibuprofen  Family History  Problem Relation Age of Onset  . Hypertension Mother     Living  . Hearing loss Mother   .  Heart disease Other   . Diabetes Brother   . Stroke Paternal Uncle   . Hearing loss Maternal Grandmother   . Healthy Father     Living  . Healthy Son   . Healthy Daughter     Social History Social History  Substance Use Topics  . Smoking status: Never Smoker   . Smokeless tobacco: Never Used  . Alcohol Use: 1.2 oz/week    2 Glasses of wine per week     Comment: 4 beers per week, during his 2010-2011 daily 2 glasses wine      Review of Systems  Constitutional: No fever/chills Eyes: No visual changes.  ENT: No sore throat. Positive for cut to nasal bridge. No epistaxis. Cardiovascular: no chest pain. Respiratory: no cough. No SOB. Gastrointestinal: \.  No nausea, no vomiting.   Musculoskeletal: Negative for back pain. Denies neck pain. Endorses bilateral cheek pain. Skin: Negative for rash. Neurological: Positive for headache but denies focal weakness or numbness. 10-point ROS otherwise negative.  ____________________________________________   PHYSICAL EXAM:  VITAL SIGNS: ED Triage Vitals  Enc Vitals Group     BP 02/23/16 1547 149/86 mmHg     Pulse Rate 02/23/16 1547 82     Resp 02/23/16 1547 16     Temp 02/23/16 1547 98.2 F (36.8 C)     Temp src --      SpO2 02/23/16 1547 100 %     Weight 02/23/16 1550 195 lb (88.451 kg)     Height 02/23/16 1550 6\' 2"  (1.88 m)     Head Cir --      Peak Flow --      Pain Score --      Pain Loc --      Pain Edu? --      Excl. in [place]? --      Constitutional: Alert and oriented. Well appearing and in no acute distress. Eyes: Conjunctivae are normal. PERRL. EOMI. Head: No visible deformity to face or head upon inspection. Superficial laceration noted to the bridge of the nose. No bleeding at this time. Patient is tender to palpation over bilateral zygomatic arches. Mild ecchymosis is noted bilat cheeks. No crepitus to palpation. No subcutaneous air palpated. ENT:      Ears:       Nose: No epistaxis. No serosanguineous  fluid  drainage.      Mouth/Throat: Mucous membranes are moist.  Neck: No stridor.  No cervical spine tenderness to palpation Cardiovascular: Normal rate, regular rhythm. Normal S1 and S2.  Good peripheral circulation. Respiratory: Normal respiratory effort without tachypnea or retractions. Lungs CTAB. Neurologic:  Normal speech and language. No gross focal neurologic deficits are appreciated. Cranial nerves II through XII are grossly intact. Skin:  Skin is warm, dry and intact. No rash noted. Psychiatric: Mood and affect are normal. Speech and behavior are normal. Patient exhibits appropriate insight and judgement.   ____________________________________________   LABS (all labs ordered are listed, but only abnormal results are displayed)  Labs Reviewed - No data to display ____________________________________________  EKG   ____________________________________________  RADIOLOGY Diamantina Providence Cuthriell, personally viewed and evaluated these images as part of my medical decision making, as well as reviewing the written report by the radiologist.  Ct Head Wo Contrast  02/23/2016  CLINICAL DATA:  Head/facial injury EXAM: CT HEAD WITHOUT CONTRAST TECHNIQUE: Contiguous axial images were obtained from the base of the skull through the vertex without intravenous contrast. COMPARISON:  Head CT dated 06/22/2005. FINDINGS: Ventricles are normal in size and configuration. All areas of the brain demonstrate normal gray-white matter attenuation. There is no mass, hemorrhage, edema or other evidence of acute parenchymal abnormality. No extra-axial hemorrhage. No osseous fracture or dislocation seen. Superficial soft tissues are unremarkable, perhaps some soft tissue edema overlying the upper right orbit which is completely imaged at the lower aspects of this exam. Chronic-appearing mucosal thickening noted within the ethmoid air cells, sphenoid sinus, and left frontal sinus. Visualized portions of the mastoid  air cells are clear. IMPRESSION: 1. No acute findings. No intracranial hemorrhage or edema. No osseous fracture or dislocation. 2. Paranasal sinus disease, of uncertain age but most likely chronic. Electronically Signed   By: Franki Cabot M.D.   On: 02/23/2016 17:08   Ct Maxillofacial Wo Cm  02/23/2016  CLINICAL DATA:  Head/ facial injury. EXAM: CT MAXILLOFACIAL WITHOUT CONTRAST TECHNIQUE: Multidetector CT imaging of the maxillofacial structures was performed. Multiplanar CT image reconstructions were also generated. A small metallic BB was placed on the right temple in order to reliably differentiate right from left. COMPARISON:  None. FINDINGS: There is focal soft tissue edema/hematoma overlying the upper right orbit and lower right frontal bone. No underlying fracture seen. Lower frontal bones appear intact and well aligned. Osseous structures about the orbits are intact and well aligned bilaterally. No displaced nasal bone fracture seen. Walls of the maxillary sinuses are intact and well aligned bilaterally. Bilateral zygoma and pterygoid plates are intact. No mandible fracture or displacement seen. Scattered areas of mucosal thickening noted within the ethmoid air cells, sphenoid sinuses and lower aspects of each maxillary sinus. Additional polypoid mucosal thickening noted within each maxillary sinus and/or mucous retention cysts. IMPRESSION: 1. Focal soft tissue edema overlying the upper right orbit and lower right frontal bone. No underlying fracture. 2. No facial bone fracture or dislocation. 3. Paranasal sinus disease, of uncertain age but most likely chronic. Electronically Signed   By: Franki Cabot M.D.   On: 02/23/2016 17:13    ____________________________________________    PROCEDURES  Procedure(s) performed:       Medications  acetaminophen (TYLENOL) tablet 650 mg (650 mg Oral Given 02/23/16 1655)     ____________________________________________   INITIAL IMPRESSION /  ASSESSMENT AND PLAN / ED COURSE  Pertinent labs & imaging results that were available during my care  of the patient were reviewed by me and considered in my medical decision making (see chart for details).  Patient's diagnosis is consistent with facial contusion and headache. CT scan of the head and face revealed no acute intracranial abnormality and no osseous abnormalities.. Patient will be discharged home with prescriptions for Naprosyn for symptom control. Patient is to follow up with primary care provider if symptoms persist past this treatment course. Patient is given ED precautions to return to the ED for any worsening or new symptoms.     ____________________________________________  FINAL CLINICAL IMPRESSION(S) / ED DIAGNOSES  Final diagnoses:  Facial contusion, initial encounter  Acute post-traumatic headache, not intractable      NEW MEDICATIONS STARTED DURING THIS VISIT:  New Prescriptions   NAPROXEN (NAPROSYN) 500 MG TABLET    Take 1 tablet (500 mg total) by mouth 2 (two) times daily with a meal.        Darletta Moll, PA-C 02/23/16 1740  Harvest Dark, MD 02/23/16 2227

## 2016-02-23 NOTE — Discharge Instructions (Signed)
Facial or Scalp Contusion °A facial or scalp contusion is a deep bruise on the face or head. Injuries to the face and head generally cause a lot of swelling, especially around the eyes. Contusions are the result of an injury that caused bleeding under the skin. The contusion may turn blue, purple, or yellow. Minor injuries will give you a painless contusion, but more severe contusions may stay painful and swollen for a few weeks.  °CAUSES  °A facial or scalp contusion is caused by a blunt injury or trauma to the face or head area.  °SIGNS AND SYMPTOMS  °· Swelling of the injured area.   °· Discoloration of the injured area.   °· Tenderness, soreness, or pain in the injured area.   °DIAGNOSIS  °The diagnosis can be made by taking a medical history and doing a physical exam. An X-ray exam, CT scan, or MRI may be needed to determine if there are any associated injuries, such as broken bones (fractures). °TREATMENT  °Often, the best treatment for a facial or scalp contusion is applying cold compresses to the injured area. Over-the-counter medicines may also be recommended for pain control.  °HOME CARE INSTRUCTIONS  °· Only take over-the-counter or prescription medicines as directed by your health care provider.   °· Apply ice to the injured area.   °· Put ice in a plastic bag.   °· Place a towel between your skin and the bag.   °· Leave the ice on for 20 minutes, 2-3 times a day.   °SEEK MEDICAL CARE IF: °· You have bite problems.   °· You have pain with chewing.   °· You are concerned about facial defects. °SEEK IMMEDIATE MEDICAL CARE IF: °· You have severe pain or a headache that is not relieved by medicine.   °· You have unusual sleepiness, confusion, or personality changes.   °· You throw up (vomit).   °· You have a persistent nosebleed.   °· You have double vision or blurred vision.   °· You have fluid drainage from your nose or ear.   °· You have difficulty walking or using your arms or legs.   °MAKE SURE YOU:   °· Understand these instructions. °· Will watch your condition. °· Will get help right away if you are not doing well or get worse. °  °This information is not intended to replace advice given to you by your health care provider. Make sure you discuss any questions you have with your health care provider. °  °Document Released: 01/15/2005 Document Revised: 12/29/2014 Document Reviewed: 07/21/2013 °Elsevier Interactive Patient Education ©2016 Elsevier Inc. ° ° ° °Head Injury, Adult °You have received a head injury. It does not appear serious at this time. Headaches and vomiting are common following head injury. It should be easy to awaken from sleeping. Sometimes it is necessary for you to stay in the emergency department for a while for observation. Sometimes admission to the hospital may be needed. After injuries such as yours, most problems occur within the first 24 hours, but side effects may occur up to 7-10 days after the injury. It is important for you to carefully monitor your condition and contact your health care provider or seek immediate medical care if there is a change in your condition. °WHAT ARE THE TYPES OF HEAD INJURIES? °Head injuries can be as minor as a bump. Some head injuries can be more severe. More severe head injuries include: °· A jarring injury to the brain (concussion). °· A bruise of the brain (contusion). This mean there is bleeding in the brain that   can cause swelling. °· A cracked skull (skull fracture). °· Bleeding in the brain that collects, clots, and forms a bump (hematoma). °WHAT CAUSES A HEAD INJURY? °A serious head injury is most likely to happen to someone who is in a car wreck and is not wearing a seat belt. Other causes of major head injuries include bicycle or motorcycle accidents, sports injuries, and falls. °HOW ARE HEAD INJURIES DIAGNOSED? °A complete history of the event leading to the injury and your current symptoms will be helpful in diagnosing head injuries. Many  times, pictures of the brain, such as CT or MRI are needed to see the extent of the injury. Often, an overnight hospital stay is necessary for observation.  °WHEN SHOULD I SEEK IMMEDIATE MEDICAL CARE?  °You should get help right away if: °· You have confusion or drowsiness. °· You feel sick to your stomach (nauseous) or have continued, forceful vomiting. °· You have dizziness or unsteadiness that is getting worse. °· You have severe, continued headaches not relieved by medicine. Only take over-the-counter or prescription medicines for pain, fever, or discomfort as directed by your health care provider. °· You do not have normal function of the arms or legs or are unable to walk. °· You notice changes in the black spots in the center of the colored part of your eye (pupil). °· You have a clear or bloody fluid coming from your nose or ears. °· You have a loss of vision. °During the next 24 hours after the injury, you must stay with someone who can watch you for the warning signs. This person should contact local emergency services (911 in the [place] if you have seizures, you become unconscious, or you are unable to wake up. °HOW CAN I PREVENT A HEAD INJURY IN THE FUTURE? °The most important factor for preventing major head injuries is avoiding motor vehicle accidents.  To minimize the potential for damage to your head, it is crucial to wear seat belts while riding in motor vehicles. Wearing helmets while bike riding and playing collision sports (like football) is also helpful. Also, avoiding dangerous activities around the house will further help reduce your risk of head injury.  °WHEN CAN I RETURN TO NORMAL ACTIVITIES AND ATHLETICS? °You should be reevaluated by your health care provider before returning to these activities. If you have any of the following symptoms, you should not return to activities or contact sports until 1 week after the symptoms have stopped: °· Persistent headache. °· Dizziness or  vertigo. °· Poor attention and concentration. °· Confusion. °· Memory problems. °· Nausea or vomiting. °· Fatigue or tire easily. °· Irritability. °· Intolerant of bright lights or loud noises. °· Anxiety or depression. °· Disturbed sleep. °MAKE SURE YOU:  °· Understand these instructions. °· Will watch your condition. °· Will get help right away if you are not doing well or get worse. °  °This information is not intended to replace advice given to you by your health care provider. Make sure you discuss any questions you have with your health care provider. °  °Document Released: 12/08/2005 Document Revised: 12/29/2014 Document Reviewed: 08/15/2013 °Elsevier Interactive Patient Education ©2016 Elsevier Inc. ° °

## 2016-02-23 NOTE — ED Notes (Signed)
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 bandaid was placed by one of the icu nurse to the nose.

## 2016-02-23 NOTE — ED Notes (Signed)
Discussed discharge instructions, prescriptions, and follow-up care with patient. No questions or concerns at this time. Pt stable at discharge.  

## 2016-02-26 ENCOUNTER — Telehealth: Payer: Self-pay | Admitting: *Deleted

## 2016-02-26 DIAGNOSIS — E291 Testicular hypofunction: Secondary | ICD-10-CM

## 2016-02-26 NOTE — Telephone Encounter (Signed)
LMOM for patient to call me back about his Testopel approval. Patient does need a Testosterone and hct drawn. (Last one December 12).

## 2016-03-06 NOTE — Telephone Encounter (Signed)
LMOM for patient to call me back about his Testopel approval and labs, I will need to talk to him before patient has another appointment for Testopel made.

## 2016-03-06 NOTE — Telephone Encounter (Signed)
Spoke with patient and he understands about lab work, he will come in the am for labs. Patient also understands that Testopel is requiring medical necessity letter and other labs and notes. Patient know that it can take just a bit and I will call him when I have final approval.

## 2016-03-07 ENCOUNTER — Other Ambulatory Visit (INDEPENDENT_AMBULATORY_CARE_PROVIDER_SITE_OTHER): Payer: 59

## 2016-03-07 DIAGNOSIS — E291 Testicular hypofunction: Secondary | ICD-10-CM

## 2016-03-07 NOTE — Progress Notes (Signed)
Patient came in for lab draw only. Left arm @7 :50am.

## 2016-03-08 LAB — TESTOSTERONE: Testosterone: 208 ng/dL — ABNORMAL LOW (ref 348–1197)

## 2016-03-08 LAB — HEMATOCRIT: HEMATOCRIT: 38.6 % (ref 37.5–51.0)

## 2016-03-13 ENCOUNTER — Other Ambulatory Visit: Payer: 59

## 2016-03-16 ENCOUNTER — Encounter: Payer: Self-pay | Admitting: Urology

## 2016-03-21 ENCOUNTER — Other Ambulatory Visit: Payer: Self-pay

## 2016-03-21 ENCOUNTER — Ambulatory Visit: Payer: 59 | Admitting: Family Medicine

## 2016-03-21 MED ORDER — ARMODAFINIL 250 MG PO TABS: 250.0000 mg | ORAL_TABLET | Freq: Every day | ORAL | Status: DC

## 2016-03-21 NOTE — Telephone Encounter (Signed)
Can you assist with a refill for this patient.  He was on Dr. Jonathon [name] schedule at 8am this am.  i can explain further to you.  Thanks

## 2016-04-01 ENCOUNTER — Telehealth: Payer: Self-pay | Admitting: *Deleted

## 2016-04-01 NOTE — Telephone Encounter (Signed)
Spoke with Patient about his Testopel approval, per [facility] (his insurance) it has been approved and they will mail [name] and patient an approval letter. I asked patient if he wanted to wait until we get the approval letter to book his appointment or go ahead and book now? Patient wants to go ahead and book procedure. Patient transferred to [facility]. up front to book appointment.

## 2016-04-04 ENCOUNTER — Other Ambulatory Visit: Payer: Self-pay | Admitting: Urology

## 2016-04-18 ENCOUNTER — Encounter: Payer: Self-pay | Admitting: Urology

## 2016-04-18 ENCOUNTER — Ambulatory Visit (INDEPENDENT_AMBULATORY_CARE_PROVIDER_SITE_OTHER): Payer: 59 | Admitting: Urology

## 2016-04-18 VITALS — BP 131/86 | HR 74 | Ht 74.0 in | Wt 204.0 lb

## 2016-04-18 DIAGNOSIS — E291 Testicular hypofunction: Secondary | ICD-10-CM | POA: Diagnosis not present

## 2016-04-18 MED ORDER — TESTOSTERONE 75 MG IL PLLT
75.0000 mg | PELLET | Freq: Once | Status: AC
  Administered 2016-04-18: 75 mg

## 2016-04-18 NOTE — Progress Notes (Signed)
This is a 50 -year-old male with hypogonadism and he is managed with Testopel. He presents today for Testopel insertion.  Patient is placed on the exam table in the right lateral jackknife position.  Identified upper outer quadrant of hip for insertion; prepped area with Betadine and injected 10 cc's of Lidocaine 1% with Epinephrine to anesthetize superficially and distally along trocar tract.  Made 3 mm incision using 15 blade of scalpel; trocar with sharp ended stylet was inserted into subcutaneous tissue in line with femur. Sharp stylet was withdrawn and 6 pellets were placed into trocar well. Testopel pellets advanced into tissue using blunt ended stylet. Trocar removed and incision closed using 6 Steri-Strips. Cleansed area to remove Betadine and covered Steri-Strips with outer Band-Aid.  Careful inspection of insertion is done and patient informed of post procedure instructions.  He will return in three month for serum testosterone, estradiol, HCT, PSA, IPSS, SHIM, ADAM and exam. before 9:00am.

## 2016-05-16 ENCOUNTER — Ambulatory Visit: Payer: Self-pay | Admitting: Physician Assistant

## 2016-05-16 ENCOUNTER — Encounter: Payer: Self-pay | Admitting: Physician Assistant

## 2016-05-16 VITALS — BP 132/90 | HR 80 | Temp 98.5°F

## 2016-05-16 DIAGNOSIS — S90212A Contusion of left great toe with damage to nail, initial encounter: Secondary | ICD-10-CM

## 2016-05-16 MED ORDER — SULFAMETHOXAZOLE-TRIMETHOPRIM 800-160 MG PO TABS: 1.0000 | ORAL_TABLET | Freq: Two times a day (BID) | ORAL | Status: DC

## 2016-05-16 NOTE — Progress Notes (Signed)
   Subjective:Left great toe pain    Patient ID: Daniel Calderon, male    DOB: Sep 04, 1966, 50 y.o.   MRN: [id number]  HPI Patient c/o left great toe pain for one week. Patient dropped a heavy garbage can on toe. Notice in last 2 days "Pus" coming from under medial nailbed. Rate pain as 3/10. No palliative measure for compliant.   Review of Systems    Negative except for compliant. Objective:   Physical Exam Erythematous and mild edema to left Hallux. Receding ecchymosis distal phalanx.       Assessment & Plan:Infected left toenail  Bactrim DS and advised Epsom Salt soak 10 minutes twice a day.  Follow up 3 day if no improvement.

## 2016-05-22 DIAGNOSIS — H524 Presbyopia: Secondary | ICD-10-CM | POA: Diagnosis not present

## 2016-05-23 ENCOUNTER — Telehealth: Payer: Self-pay | Admitting: Nurse Practitioner

## 2016-05-23 ENCOUNTER — Ambulatory Visit
Admission: EM | Admit: 2016-05-23 | Discharge: 2016-05-23 | Disposition: A | Discharge status: Home / Self Care | Payer: 59 | Attending: Family Medicine | Admitting: Family Medicine

## 2016-05-23 ENCOUNTER — Telehealth: Payer: Self-pay | Admitting: *Deleted

## 2016-05-23 DIAGNOSIS — S30861A Insect bite (nonvenomous) of abdominal wall, initial encounter: Secondary | ICD-10-CM

## 2016-05-23 DIAGNOSIS — W57XXXA Bitten or stung by nonvenomous insect and other nonvenomous arthropods, initial encounter: Secondary | ICD-10-CM | POA: Diagnosis not present

## 2016-05-23 MED ORDER — MUPIROCIN 2 % EX OINT
1.0000 | TOPICAL_OINTMENT | Freq: Three times a day (TID) | CUTANEOUS | Status: DC
Start: 2016-05-23 — End: 2017-02-20

## 2016-05-23 MED ORDER — MUPIROCIN 2 % EX OINT: 1.0000 "application " | TOPICAL_OINTMENT | Freq: Three times a day (TID) | CUTANEOUS | Status: DC

## 2016-05-23 NOTE — ED Provider Notes (Signed)
CSN: [id number]     Arrival date & time 05/23/16  1330 History   First MD Initiated Contact with Patient 05/23/16 1438     Chief Complaint  Patient presents with  . Tick Removal    Left lower abdomen tick bite x yesterday. Questioning if head of tick remains embedded.    (Consider location/radiation/quality/duration/timing/severity/associated sxs/prior Treatment) HPI   This is a 50 year old male who presents with a tick bite to the lower left abdominal area. He states that he has noticed a lot of tics even for short excursions into the woods but also even around his house. Yesterday he found a tick that was on his left lower abdomen that was attached but not engorged. His wife used tweezers to pull the tick off but inadvertently twisted the tick and noticed that half of the head was missing. He attempted to take it out himself but was unsuccessful came here. The tick was on him for certainly less than 2 days. 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  Past Medical History  Diagnosis Date  . Allergy     Seasonal  . Anxiety   . Depression   . Sleep apnea     Currently uses cpap  . Kidney stones     History of kidney stones  . Migraine   . Frequent headaches   . Hypogonadism in male   . Failure of erection   . Benign enlargement of prostate   . Elevated transaminase level   . Fatigue   . Elevated BP    Past Surgical History  Procedure Laterality Date  . Left shoulder surgery  2011  . Scalene node biopsy / excision    . Nose surgery    . Nerve block      2 in neck. 5 in back.  . Vasectomy     Family History  Problem Relation Age of Onset  . Hypertension Mother     Living  . Hearing loss Mother   . Heart disease Other   . Diabetes Brother   . Stroke Paternal Uncle   . Hearing loss Maternal Grandmother   . Healthy Father     Living  . Healthy Son   . Healthy Daughter    Social History  Substance Use Topics  . Smoking status: Never Smoker   . Smokeless tobacco: Never Used   . Alcohol Use: 1.2 oz/week    2 Glasses of wine per week     Comment: 4 beers per week, during his 2010-2011 daily 2 glasses wine     Review of Systems  Constitutional: Negative for fever, chills, activity change and fatigue.  Skin: Positive for color change and wound.  All other systems reviewed and are negative.   Allergies  Erythromycin; Cephalexin; Dexamethasone; Oxycodone-acetaminophen; Oxycodone-acetaminophen; Prednisone; Gabapentin; and Ibuprofen  Home Medications   Prior to Admission medications   Medication Sig Start Date End Date Taking? Authorizing Provider  CIALIS 20 MG tablet TAKE ONE TABLET BY MOUTH PRIOR TO SEXUAL ACTIVITY 04/07/16  Yes Shannon A McGowan, PA-C  fluticasone (FLONASE) 50 MCG/ACT nasal spray Place 2 sprays into both nostrils daily. 07/16/15  Yes Rubbie Battiest, NP  omeprazole (PRILOSEC) 20 MG capsule TAKE 1 CAPSULE (20 MG TOTAL) BY MOUTH DAILY. 12/14/15  Yes Rubbie Battiest, NP  sertraline (ZOLOFT) 100 MG tablet TAKE 1 TABLET (100 MG TOTAL) BY MOUTH DAILY. 01/24/16  Yes Rubbie Battiest, NP  sulfamethoxazole-trimethoprim (BACTRIM DS,SEPTRA DS) 800-160 MG tablet Take 1 tablet  by mouth 2 (two) times daily. 05/16/16  Yes Sable Feil, PA-C  Testosterone 75 MG PLLT TESTOPEL, 75MG  (Implant Pellet) - Historical Medication  (75 MG) Active   Yes Historical Provider, MD  Testosterone Propionate POWD Apply topically. Reported on 05/16/2016 05/09/09  Yes Historical Provider, MD  albuterol (PROVENTIL HFA;VENTOLIN HFA) 108 (90 Base) MCG/ACT inhaler Inhale 2 puffs into the lungs every 6 (six) hours as needed for wheezing or shortness of breath. 12/31/15   Versie Starks, PA-C  Armodafinil 250 MG tablet Take 1 tablet (250 mg total) by mouth daily. Patient not taking: Reported on 05/16/2016 03/21/16   Jackolyn Confer, MD  azithromycin (ZITHROMAX Z-PAK) 250 MG tablet 2 pills today then 1 pill a day for 4 days Patient not taking: Reported on 04/18/2016 12/31/15   Versie Starks, PA-C   methylPREDNISolone (MEDROL DOSEPAK) 4 MG TBPK tablet Take 6 pills on day one then decrease by 1 pill each day Patient not taking: Reported on 04/18/2016 12/31/15   Versie Starks, PA-C  mupirocin ointment (BACTROBAN) 2 % Apply 1 application topically 3 (three) times daily. 05/23/16   Lorin Picket, PA-C  naproxen (NAPROSYN) 500 MG tablet Take 1 tablet (500 mg total) by mouth 2 (two) times daily with a meal. Patient not taking: Reported on 05/16/2016 02/23/16   Charline Bills Cuthriell, PA-C   Meds Ordered and Administered this Visit  Medications - No data to display  BP 124/85 mmHg  Pulse 90  Temp(Src) 98 F (36.7 C) (Oral)  Resp 20  Ht 6\' 2"  (1.88 m)  Wt 205 lb (92.987 kg)  BMI 26.31 kg/m2  SpO2 100% No data found.   Physical Exam  Constitutional: He is oriented to person, place, and time. He appears well-developed and well-nourished. No distress.  HENT:  Head: Normocephalic and atraumatic.  Eyes: Conjunctivae are normal. Pupils are equal, round, and reactive to light.  Neck: Normal range of motion. Neck supple.  Musculoskeletal: Normal range of motion.  Neurological: He is alert and oriented to person, place, and time.  Skin: Skin is warm and dry. Rash noted. He is not diaphoretic. There is erythema.  Examination the abdomen shows a well-circumscribed erythematous indurated wheal the central punctate area that is not draining. No identifiable mouthparts are visualized topically.  Psychiatric: He has a normal mood and affect. His behavior is normal. Judgment and thought content normal.  Nursing note and vitals reviewed.   ED Course  Procedures (including critical care time)  Labs Review Labs Reviewed - No data to display  Imaging Review No results found.   Visual Acuity Review  Right Eye Distance:   Left Eye Distance:   Bilateral Distance:    Right Eye Near:   Left Eye Near:    Bilateral Near:         MDM   1. Tick bite of abdomen, initial encounter     Discharge Medication List as of 05/23/2016  3:18 PM    START taking these medications   Details  mupirocin ointment (BACTROBAN) 2 % Apply 1 application topically 3 (three) times daily., Starting 05/23/2016, Until Discontinued, Normal      Plan: 1. Test/x-ray results and diagnosis reviewed with patient 2. rx as per orders; risks, benefits, potential side effects reviewed with patient 3. Recommend supportive treatment with Cleaning 3 times a day and application of Bactroban to the area. I given him information regarding tick bites and proper care. He was also watch out for any fever  or rashes that develop particularly erythema migrans. I told him that it's recommended to leave the mouthparts in place and not cause more trauma by trying to remove it while the skin to slough off normally. Is any problems or concerns she should return to our clinic or to his primary care physician 4. F/u prn if symptoms worsen or don't improve     Lorin Picket, PA-C 05/23/16 1623

## 2016-05-23 NOTE — Discharge Instructions (Signed)
Tick Bite Information Ticks are insects that attach themselves to the skin and draw blood for food. There are various types of ticks. Common types include wood ticks and deer ticks. Most ticks live in shrubs and grassy areas. Ticks can climb onto your body when you make contact with leaves or grass where the tick is waiting. The most common places on the body for ticks to attach themselves are the scalp, neck, armpits, waist, and groin. Most tick bites are harmless, but sometimes ticks carry germs that cause diseases. These germs can be spread to a person during the tick's feeding process. The chance of a disease spreading through a tick bite depends on:   The type of tick.  Time of year.   How long the tick is attached.   Geographic location.  HOW CAN YOU PREVENT TICK BITES? Take these steps to help prevent tick bites when you are outdoors:  Wear protective clothing. Long sleeves and long pants are best.   Wear white clothes so you can see ticks more easily.  Tuck your pant legs into your socks.   If walking on a trail, stay in the middle of the trail to avoid brushing against bushes.  Avoid walking through areas with long grass.  Put insect repellent on all exposed skin and along boot tops, pant legs, and sleeve cuffs.   Check clothing, hair, and skin repeatedly and before going inside.   Brush off any ticks that are not attached.  Take a shower or bath as soon as possible after being outdoors.  WHAT IS THE PROPER WAY TO REMOVE A TICK? Ticks should be removed as soon as possible to help prevent diseases caused by tick bites. 1. If latex gloves are available, put them on before trying to remove a tick.  2. Using fine-point tweezers, grasp the tick as close to the skin as possible. You may also use curved forceps or a tick removal tool. Grasp the tick as close to its head as possible. Avoid grasping the tick on its body. 3. Pull gently with steady upward pressure until  the tick lets go. Do not twist the tick or jerk it suddenly. This may break off the tick's head or mouth parts. 4. Do not squeeze or crush the tick's body. This could force disease-carrying fluids from the tick into your body.  5. After the tick is removed, wash the bite area and your hands with soap and water or other disinfectant such as alcohol. 6. Apply a small amount of antiseptic cream or ointment to the bite site.  7. Wash and disinfect any instruments that were used.  Do not try to remove a tick by applying a hot match, petroleum jelly, or fingernail polish to the tick. These methods do not work and may increase the chances of disease being spread from the tick bite.  WHEN SHOULD YOU SEEK MEDICAL CARE? Contact your health care provider if you are unable to remove a tick from your skin or if a part of the tick breaks off and is stuck in the skin.  After a tick bite, you need to be aware of signs and symptoms that could be related to diseases spread by ticks. Contact your health care provider if you develop any of the following in the days or weeks after the tick bite:  Unexplained fever.  Rash. A circular rash that appears days or weeks after the tick bite may indicate the possibility of Lyme disease. The rash may resemble   a target with a bull's-eye and may occur at a different part of your body than the tick bite.  Redness and swelling in the area of the tick bite.   Tender, swollen lymph glands.   Diarrhea.   Weight loss.   Cough.   Fatigue.   Muscle, joint, or bone pain.   Abdominal pain.   Headache.   Lethargy or a change in your level of consciousness.  Difficulty walking or moving your legs.   Numbness in the legs.   Paralysis.  Shortness of breath.   Confusion.   Repeated vomiting.    This information is not intended to replace advice given to you by your health care provider. Make sure you discuss any questions you have with your health  care provider.   Document Released: 12/05/2000 Document Revised: 12/29/2014 Document Reviewed: 05/18/2013 Elsevier Interactive Patient Education 2016 Elsevier Inc.  

## 2016-05-23 NOTE — Telephone Encounter (Signed)
Patient Name: Daniel Calderon DOB: September 27, 1966 Initial Comment Caller found a tick and the head is buried into his stomach. -- He is on an ABX for toe infection. -- from teh walk in clinic Nurse Assessment Nurse: Daniel Sa, RN, Daniel Calderon Date/Time Daniel Calderon Time): 05/23/2016 12:09:01 PM Confirm and document reason for call. If symptomatic, describe symptoms. You must click the next button to save text entered. ---Caller states he removed a tick from his stomach last night and the head broke off under the skin. No fever. The area is red and inflamed. No streaks present. Alert and responsive. Has the patient traveled out of the country within the last 30 days? ---No Does the patient have any new or worsening symptoms? ---Yes Will a triage be completed? ---Yes Related visit to physician within the last 2 weeks? ---Yes Does the PT have any chronic conditions? (i.e. diabetes, asthma, etc.) ---Yes List chronic conditions. ---He started antibiotics for a toe infection for the past week, OCD Is this a behavioral health or substance abuse call? ---No Guidelines Guideline Title Affirmed Question Affirmed Notes Tick Bite Can't remove tick's head that was broken off in the skin (after trying Care Advice) Final Disposition User See Physician within 24 Hours [place], RN, [place] - SPECIFY. Unable to schedule appointment at the office. Daniel Calderon plans to go to an urgent care facility near his house.  Disagree/Comply: Comply

## 2016-07-01 ENCOUNTER — Other Ambulatory Visit: Payer: Self-pay | Admitting: Nurse Practitioner

## 2016-07-01 NOTE — Telephone Encounter (Signed)
Please advise refill, last filled on 03/21/2016 with 2 refills.  Thanks.  He has an appt with you coming up. thanks

## 2016-07-01 NOTE — Telephone Encounter (Signed)
Pt called about needing a refill for Armodafinil 250 MG tablet  Pharmacy is [facility], [address]  Call pt @ [phone]. Thank you!

## 2016-07-03 ENCOUNTER — Ambulatory Visit (INDEPENDENT_AMBULATORY_CARE_PROVIDER_SITE_OTHER): Payer: 59 | Admitting: Family Medicine

## 2016-07-03 ENCOUNTER — Encounter: Payer: Self-pay | Admitting: Family Medicine

## 2016-07-03 VITALS — BP 128/64 | HR 88 | Temp 98.5°F | Ht 74.0 in | Wt 205.0 lb

## 2016-07-03 DIAGNOSIS — M542 Cervicalgia: Secondary | ICD-10-CM | POA: Diagnosis not present

## 2016-07-03 DIAGNOSIS — R5382 Chronic fatigue, unspecified: Secondary | ICD-10-CM

## 2016-07-03 DIAGNOSIS — G4730 Sleep apnea, unspecified: Secondary | ICD-10-CM | POA: Diagnosis not present

## 2016-07-03 MED ORDER — ARMODAFINIL 250 MG PO TABS: 250.0000 mg | ORAL_TABLET | Freq: Every day | ORAL | Status: DC

## 2016-07-03 NOTE — Progress Notes (Signed)
Patient ID: Daniel Calderon, male   DOB: December 29, 1965, 50 y.o.   MRN: [id number]  Tommi Rumps, MD Phone: [phone]  Daniel Calderon is a 50 y.o. male who presents today for follow-up.  Fatigue: Patient notes chronic fatigue. Some days he will be fine and other days he'll feel tired. Underwent significant workup per his report through neurology. They ruled out narcolepsy. He is found to have a deviated septum and sleep apnea. Septum was fixed and has been on CPAP and this has been better. Takes Nuvigil to help with his fatigue for the last 2 years. Does occasionally take a break from this. No palpitations or appetite changes.  Sleep apnea: Currently using his CPAP nightly for 6-8 hours at a time. Notes he sleeps very well with this. If he does not use this is excessively sleepy.  Patient notes a history of thoracic outlet syndrome on the left side. Had a rib removed and then had nerve blocks. Reports the first nerve block at the wrong 7 nerves and subsequent nerve blocks run health hold. Does note a concussion several months ago after getting hit with a beam on the head. This brought back some symptoms in his left arm. Occasionally will get numbness and pain in his arm. He is being followed in [place] for this currently and has upcoming follow-up. Notes today is the first day he has not had any light sensitivity since having a concussion.  PMH: nonsmoker.   ROS see history of present illness  Objective  Physical Exam Filed Vitals:   07/03/16 0855  BP: 128/64  Pulse: 88  Temp: 98.5 F (36.9 C)    BP Readings from Last 3 Encounters:  07/03/16 128/64  05/23/16 124/85  05/16/16 132/90   Wt Readings from Last 3 Encounters:  07/03/16 205 lb (92.987 kg)  05/23/16 205 lb (92.987 kg)  04/18/16 204 lb (92.534 kg)    Physical Exam  Constitutional: He is well-developed, well-nourished, and in no distress.  HENT:  Head: Normocephalic and atraumatic.  Right Ear: External ear  normal.  Left Ear: External ear normal.  Nose: Nose normal.  Cardiovascular: Normal rate, regular rhythm and normal heart sounds.   Pulmonary/Chest: Effort normal and breath sounds normal.  Musculoskeletal:  No midline spine tenderness, no midline spine step-off, no muscular neck or upper back tenderness  Neurological: He is alert.  CN 2-12 intact, 5/5 strength in bilateral biceps, triceps, grip, quads, hamstrings, plantar and dorsiflexion, sensation to light touch intact in bilateral UE and LE, normal gait, 2+ patellar reflexes  Skin: Skin is warm and dry. He is not diaphoretic.     Assessment/Plan: Please see individual problem list.  Fatigue Patient with chronic fatigue. Nuvigil is somewhat helpful. Symptoms are stable. Previously evaluated by neurology. He'll continue Nuvigil and continue to monitor.  Sleep apnea Using CPAP daily. Continue CPAP use.  Neck pain Chronic issue. Previously with surgery for thoracic outlet syndrome and several nerve blocks. Somewhat worsened recently following getting hit on the head by a beam in the hospital and obtaining a concussion. He gets pain in his left arm with this. Being followed by a physician in [place] for concussion and arm pain. Has follow-up with them soon. Offered evaluation locally though he deferred until after his follow-up in [place]. He will continue to monitor and is given return precautions.    No orders of the defined types were placed in this encounter.    Meds ordered this encounter  Medications  . Armodafinil  250 MG tablet    Sig: Take 1 tablet (250 mg total) by mouth daily.    Dispense:  30 tablet    Refill:  2    Tommi Rumps, MD [place]

## 2016-07-03 NOTE — Assessment & Plan Note (Signed)
Patient with chronic fatigue. Nuvigil is somewhat helpful. Symptoms are stable. Previously evaluated by neurology. He'll continue Nuvigil and continue to monitor.

## 2016-07-03 NOTE — Patient Instructions (Signed)
Nice to see you. I have refilled your Nuvigil. Please continue to use your CPAP. Please follow-up with the physicians in [place] regarding your concussion. If you develop worsening headaches, numbness, weakness, worsening fatigue, or any new or changing symptoms please seek medical attention.

## 2016-07-03 NOTE — Progress Notes (Signed)
Pre visit review using our clinic review tool, if applicable. No additional management support is needed unless otherwise documented below in the visit note. 

## 2016-07-03 NOTE — Assessment & Plan Note (Signed)
Chronic issue. Previously with surgery for thoracic outlet syndrome and several nerve blocks. Somewhat worsened recently following getting hit on the head by a beam in the hospital and obtaining a concussion. He gets pain in his left arm with this. Being followed by a physician in [place] for concussion and arm pain. Has follow-up with them soon. Offered evaluation locally though he deferred until after his follow-up in [place]. He will continue to monitor and is given return precautions.

## 2016-07-03 NOTE — Assessment & Plan Note (Signed)
Using CPAP daily. Continue CPAP use.

## 2016-07-11 ENCOUNTER — Other Ambulatory Visit: Payer: Self-pay

## 2016-07-11 DIAGNOSIS — Z79899 Other long term (current) drug therapy: Secondary | ICD-10-CM

## 2016-07-14 ENCOUNTER — Other Ambulatory Visit: Payer: 59

## 2016-07-17 ENCOUNTER — Other Ambulatory Visit: Payer: 59

## 2016-07-17 ENCOUNTER — Encounter: Payer: Self-pay | Admitting: Urology

## 2016-07-18 ENCOUNTER — Encounter: Payer: 59 | Admitting: Urology

## 2016-07-18 NOTE — Progress Notes (Signed)
This encounter was created in error - please disregard.

## 2016-07-24 ENCOUNTER — Ambulatory Visit: Payer: 59 | Admitting: Urology

## 2016-07-24 ENCOUNTER — Encounter: Payer: Self-pay | Admitting: Urology

## 2016-07-30 ENCOUNTER — Encounter: Payer: Self-pay | Admitting: Family Medicine

## 2016-07-30 ENCOUNTER — Ambulatory Visit (INDEPENDENT_AMBULATORY_CARE_PROVIDER_SITE_OTHER): Payer: 59 | Admitting: Family Medicine

## 2016-07-30 DIAGNOSIS — J0100 Acute maxillary sinusitis, unspecified: Secondary | ICD-10-CM

## 2016-07-30 MED ORDER — DOXYCYCLINE HYCLATE 100 MG PO TABS: 100.0000 mg | ORAL_TABLET | Freq: Two times a day (BID) | ORAL | 0 refills | Status: DC

## 2016-07-30 NOTE — Patient Instructions (Signed)
Nice to see you. Your symptoms are likely related to a sinus infection. We will treat you with doxycycline. You can take plain Mucinex over-the-counter and should drink plenty of fluids with this. If you develop worsening headache, vision changes, numbness, weakness, fevers, dizziness, or any new or changing symptoms please seek medical attention.

## 2016-07-30 NOTE — Assessment & Plan Note (Signed)
Symptoms most consistent with sinusitis. Some of his symptoms are consistent with migraines of which he does have a history, though most consistent with sinusitis. He is neurologically intact today. Vision checked and appears stable with eyes being relatively close to each other. Given progressive worsening and significant sinus pressure and congestion we will proceed with treatment with antibiotics. We'll treat with doxycycline. He continues Claritin and Flonase. If not improved by early next week he will let us know. If worsens he will seek medical attention. He is given return precautions.

## 2016-07-30 NOTE — Progress Notes (Signed)
  Tommi Rumps, MD Phone: [phone]  Daniel Calderon is a 50 y.o. male who presents today for same-day visit.  Patient notes 6-7 days of sinus congestion and pressure. Notes blowing yellowish clear mucus out and getting yellowish clear mucus with saline rinses. Some nausea though no vomiting. Mild headache with this. Notes light is bothering him somewhat with this. No vision changes or double vision. Feels somewhat similar to when he had his concussion previously. No recent head injury. Notes concussion symptoms had improved previously. No ear pain, fevers, or dizziness. No numbness or weakness. Does note some postnasal drip. Has been using Claritin and Flonase with some benefit. 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  PMH: nonsmoker.   ROS see history of present illness  Objective  Physical Exam Vitals:   07/30/16 1458  BP: 140/86  Pulse: 93  Resp: 20    BP Readings from Last 3 Encounters:  07/30/16 140/86  07/03/16 128/64  05/23/16 124/85   Wt Readings from Last 3 Encounters:  07/30/16 207 lb (93.9 kg)  07/03/16 205 lb (93 kg)  05/23/16 205 lb (93 kg)    Physical Exam  Constitutional: No distress.  HENT:  Head: Normocephalic and atraumatic.  Mouth/Throat: Oropharynx is clear and moist. No oropharyngeal exudate.  Normal TMs bilaterally, tenderness to percussion of bilateral maxillary and frontal sinuses  Eyes: Conjunctivae are normal. Pupils are equal, round, and reactive to light.  Neck: Neck supple.  Cardiovascular: Normal rate, regular rhythm and normal heart sounds.   Pulmonary/Chest: Effort normal and breath sounds normal.  Musculoskeletal: He exhibits no edema.  Lymphadenopathy:    He has no cervical adenopathy.  Neurological: He is alert.  CN 2-12 intact, 5/5 strength in bilateral biceps, triceps, grip, quads, hamstrings, plantar and dorsiflexion, sensation to light touch intact in bilateral UE and LE, normal gait, 2+ patellar reflexes    Skin: Skin is warm and dry. He is not diaphoretic.     Assessment/Plan: Please see individual problem list.  Sinusitis, acute maxillary Symptoms most consistent with sinusitis. Some of his symptoms are consistent with migraines of which he does have a history, though most consistent with sinusitis. He is neurologically intact today. Vision checked and appears stable with eyes being relatively close to each other. Given progressive worsening and significant sinus pressure and congestion we will proceed with treatment with antibiotics. We'll treat with doxycycline. He continues Claritin and Flonase. If not improved by early next week he will let us know. If worsens he will seek medical attention. He is given return precautions.   No orders of the defined types were placed in this encounter.   Meds ordered this encounter  Medications  . doxycycline (VIBRA-TABS) 100 MG tablet    Sig: Take 1 tablet (100 mg total) by mouth 2 (two) times daily.    Dispense:  14 tablet    Refill:  0    Tommi Rumps, MD [place]

## 2016-07-30 NOTE — Progress Notes (Signed)
Pre visit review using our clinic review tool, if applicable. No additional management support is needed unless otherwise documented below in the visit note. 

## 2016-08-01 ENCOUNTER — Other Ambulatory Visit: Payer: Self-pay | Admitting: Nurse Practitioner

## 2016-08-18 ENCOUNTER — Telehealth: Payer: Self-pay | Admitting: Urology

## 2016-09-02 ENCOUNTER — Ambulatory Visit (INDEPENDENT_AMBULATORY_CARE_PROVIDER_SITE_OTHER): Payer: 59 | Admitting: Urology

## 2016-09-02 ENCOUNTER — Encounter: Payer: Self-pay | Admitting: Urology

## 2016-09-02 VITALS — BP 133/90 | HR 84 | Ht 74.0 in | Wt 205.3 lb

## 2016-09-02 DIAGNOSIS — N528 Other male erectile dysfunction: Secondary | ICD-10-CM

## 2016-09-02 DIAGNOSIS — Z79899 Other long term (current) drug therapy: Secondary | ICD-10-CM | POA: Diagnosis not present

## 2016-09-02 DIAGNOSIS — N401 Enlarged prostate with lower urinary tract symptoms: Secondary | ICD-10-CM

## 2016-09-02 DIAGNOSIS — N138 Other obstructive and reflux uropathy: Secondary | ICD-10-CM

## 2016-09-02 DIAGNOSIS — E291 Testicular hypofunction: Secondary | ICD-10-CM

## 2016-09-02 DIAGNOSIS — N529 Male erectile dysfunction, unspecified: Secondary | ICD-10-CM

## 2016-09-02 NOTE — Progress Notes (Signed)
09/02/2016 4:20 PM   Daniel Calderon [birth year] [id number]  Referring provider: [name], MD [address] [place],  [address]  Chief Complaint  Patient presents with  . Hypogonadism    6 month follow up   . Benign Prostatic Hypertrophy    HPI: Patient is a 50 year old Caucasian male with hypogonadism, erectile dysfunction and BPH with LUTS who presents today for an overdue 6 month follow up.  Patient suffered a concussion during a work accident and has been forgetful.    Hypogonadism Patient is experiencing a decrease in libido, a lack of energy, a decrease in strength, a decreased enjoyment in life, sadness and/or grumpiness, erections being less strong, a recent deterioration in an ability to play sports, falling asleep after dinner and a recent deterioration in their work performance.   This is indicated by his responses to the ADAM questionnaire.  He is still having spontaneous erections at night.  He has sleep apnea and is sleeping with a CPAP machine.   His most recent testosterone level was 217 ng/dL on 09/02/2016.  He is currently managing his hypogonadism with Testopel insertion.  His las insertion was 04/18/2016 with 6 pellets.        Androgen Deficiency in the Aging Male    [place] Name 09/02/16 1500         Androgen Deficiency in the Aging Male   Do you have a decrease in libido (sex drive) Yes     Do you have lack of energy Yes     Do you have a decrease in strength and/or endurance Yes     Have you lost height No     Have you noticed a decreased "enjoyment of life" Yes     Are you sad and/or grumpy Yes     Are your erections less strong Yes     Have you noticed a recent deterioration in your ability to play sports Yes     Are you falling asleep after dinner Yes     Has there been a recent deterioration in your work performance Yes         Erectile dysfunction His SHIM score is 10, which is moderate ED.   He has been having  difficulty with erections for the last several years .   His major complaint is his confidence that he could achieve an erection.  His libido is diminished.   His risk factors for ED are age, BPH, sleep apnea, hypogonadism, anxiety and depression.  He denies any painful erections or curvatures with his erections.   He has tried Cialis in the past with good effect.         SHIM    Row Name 09/02/16 1547         SHIM: Over the last 6 months:   How do you rate your confidence that you could get and keep an erection? Very Low     When you had erections with sexual stimulation, how often were your erections hard enough for penetration (entering your partner)? A Few Times (much less than half the time)     During sexual intercourse, how often were you able to maintain your erection after you had penetrated (entered) your partner? Difficult     During sexual intercourse, how difficult was it to maintain your erection to completion of intercourse? Very Difficult     When you attempted sexual intercourse, how often was it satisfactory for you? Very  Difficult       SHIM Total Score   SHIM 10        Score: 1-7 Severe ED 8-11 Moderate ED 12-16 Mild-Moderate ED 17-21 Mild ED 22-25 No ED    BPH WITH LUTS His IPSS score today is 12, which is moderate lower urinary tract symptomatology. He is pleased with his quality life due to his urinary symptoms.  His previous IPSS score was 7/1.  His major complaint today is urinary frequency.  He has had these symptoms for the last few years.  He denies any dysuria, hematuria or suprapubic pain.  He also denies any recent fevers, chills, nausea or vomiting.  He does not have a family history of PCa.      IPSS    Row Name 09/02/16 1500         International Prostate Symptom Score   How often have you had the sensation of not emptying your bladder? Less than 1 in 5     How often have you had to urinate less than every two hours? More than half the time      How often have you found you stopped and started again several times when you urinated? Less than half the time     How often have you found it difficult to postpone urination? Less than half the time     How often have you had a weak urinary stream? Less than 1 in 5 times     How often have you had to strain to start urination? Not at All     How many times did you typically get up at night to urinate? 2 Times     Total IPSS Score 12       Quality of Life due to urinary symptoms   If you were to spend the rest of your life with your urinary condition just the way it is now how would you feel about that? Pleased        Score:  1-7 Mild 8-19 Moderate 20-35 Severe    PMH: Past Medical History:  Diagnosis Date  . Allergy    Seasonal  . Anxiety   . Benign enlargement of prostate   . Depression   . Elevated BP   . Elevated transaminase level   . Failure of erection   . Fatigue   . Frequent headaches   . Hypogonadism in male   . Kidney stones    History of kidney stones  . Migraine   . Sleep apnea    Currently uses cpap    Surgical History: Past Surgical History:  Procedure Laterality Date  . Left Shoulder Surgery  2011  . nerve block     2 in neck. 5 in back.  . NOSE SURGERY    . SCALENE NODE BIOPSY / EXCISION    . VASECTOMY      Home Medications:    Medication List       Accurate as of 09/02/16  4:20 PM. Always use your most recent med list.          albuterol 108 (90 Base) MCG/ACT inhaler Commonly known as:  PROVENTIL HFA;VENTOLIN HFA Inhale 2 puffs into the lungs every 6 (six) hours as needed for wheezing or shortness of breath.   Armodafinil 250 MG tablet Take 1 tablet (250 mg total) by mouth daily.   azithromycin 250 MG tablet Commonly known as:  ZITHROMAX Z-PAK 2 pills today then 1 pill a day for  4 days   butalbital-acetaminophen-caffeine 50-325-40 MG tablet Commonly known as:  FIORICET, ESGIC   CIALIS 20 MG tablet Generic drug:   tadalafil TAKE ONE TABLET BY MOUTH PRIOR TO SEXUAL ACTIVITY   doxycycline 100 MG tablet Commonly known as:  VIBRA-TABS Take 1 tablet (100 mg total) by mouth 2 (two) times daily.   DULoxetine 30 MG capsule Commonly known as:  CYMBALTA Take by mouth.   fluticasone 50 MCG/ACT nasal spray Commonly known as:  FLONASE Place 2 sprays into both nostrils daily.   methylPREDNISolone 4 MG Tbpk tablet Commonly known as:  MEDROL DOSEPAK Take 6 pills on day one then decrease by 1 pill each day   mupirocin ointment 2 % Commonly known as:  BACTROBAN Apply 1 application topically 3 (three) times daily.   naproxen 500 MG tablet Commonly known as:  NAPROSYN Take 1 tablet (500 mg total) by mouth 2 (two) times daily with a meal.   omeprazole 20 MG capsule Commonly known as:  PRILOSEC TAKE 1 CAPSULE (20 MG TOTAL) BY MOUTH DAILY.   sertraline 100 MG tablet Commonly known as:  ZOLOFT TAKE 1 TABLET (100 MG TOTAL) BY MOUTH DAILY.   sulfamethoxazole-trimethoprim 800-160 MG tablet Commonly known as:  BACTRIM DS,SEPTRA DS Take 1 tablet by mouth 2 (two) times daily.   Testosterone 75 MG Pllt TESTOPEL, 75MG  (Implant Pellet) - Historical Medication  (75 MG) Active   Testosterone Propionate Powd Apply topically. Reported on 05/16/2016       Allergies:  Allergies  Allergen Reactions  . Erythromycin Nausea And Vomiting  . Cephalexin     Other reaction(s): Other (See Comments) Other Reaction: Other reaction  . Dexamethasone     Other reaction(s): Other (See Comments) Other Reaction: Other reaction  . Oxycodone-Acetaminophen     Other reaction(s): Other (See Comments) Other Reaction: Other reaction  . Oxycodone-Acetaminophen     Other reaction(s): Other (See Comments) Other Reaction: Other reaction  . Prednisone     Other reaction(s): Other (See Comments) Other Reaction: Other reaction  . Gabapentin Other (See Comments)    Aggressive  . Ibuprofen Itching    Family History: Family  History  Problem Relation Age of Onset  . Hypertension Mother     Living  . Hearing loss Mother   . Heart disease Other   . Diabetes Brother   . Stroke Paternal Uncle   . Hearing loss Maternal Grandmother   . Healthy Father     Living  . Healthy Son   . Healthy Daughter     Social History:  reports that he has never smoked. He has never used smokeless tobacco. He reports that he drinks about 1.2 oz of alcohol per week . He reports that he does not use drugs.  ROS: UROLOGY Frequent Urination?: No Hard to postpone urination?: No Burning/pain with urination?: No Get up at night to urinate?: No Leakage of urine?: No Urine stream starts and stops?: No Trouble starting stream?: No Do you have to strain to urinate?: No Blood in urine?: No Urinary tract infection?: No Sexually transmitted disease?: No Injury to kidneys or bladder?: No Painful intercourse?: No Weak stream?: No Erection problems?: No Penile pain?: No  Gastrointestinal Nausea?: No Vomiting?: No Indigestion/heartburn?: No Diarrhea?: No Constipation?: No  Constitutional Fever: No Night sweats?: No Weight loss?: No Fatigue?: No  Skin Skin rash/lesions?: No Itching?: No  Eyes Blurred vision?: No Double vision?: No  Ears/Nose/Throat Sore throat?: No Sinus problems?: No  Hematologic/Lymphatic Swollen glands?: No Easy  bruising?: No  Cardiovascular Leg swelling?: No Chest pain?: No  Respiratory Cough?: No Shortness of breath?: No  Endocrine Excessive thirst?: No  Musculoskeletal Back pain?: No Joint pain?: No  Neurological Headaches?: No Dizziness?: No  Psychologic Depression?: No Anxiety?: No  Physical Exam: BP 133/90   Pulse 84   Ht 6\' 2"  (1.88 m)   Wt 205 lb 4.8 oz (93.1 kg)   BMI 26.36 kg/m    Constitutional: Well nourished. Alert and oriented, No acute distress. HEENT: [place] AT, moist mucus membranes. Trachea midline, no masses. Cardiovascular: No clubbing, cyanosis,  or edema. Respiratory: Normal respiratory effort, no increased work of breathing. GI: Abdomen is soft, non tender, non distended, no abdominal masses. Liver and spleen not palpable.  No hernias appreciated.  Stool sample for occult testing is not indicated.   GU: No CVA tenderness.  No bladder fullness or masses.  Patient with circumcised phallus. Urethral meatus is patent.  No penile discharge. No penile lesions or rashes. Scrotum without lesions, cysts, rashes and/or edema.  Testicles are located scrotally bilaterally. No masses are appreciated in the testicles. Left and right epididymis are normal. Rectal: Patient with  normal sphincter tone. Anus and perineum without scarring or rashes. No rectal masses are appreciated. Prostate is approximately 50 grams, no nodules are appreciated. Seminal vesicles are normal. Skin: No rashes, bruises or suspicious lesions. Lymph: No cervical or inguinal adenopathy. Neurologic: Grossly intact, no focal deficits, moving all 4 extremities. Psychiatric: Normal mood and affect.  Laboratory Data: Lab Results  Component Value Date   WBC 7.2 04/02/2015   HCT 38.6 03/07/2016   PLT 211 04/02/2015    Lab Results  Component Value Date   CREATININE 1.0 04/02/2015    Lab Results  Component Value Date   TESTOSTERONE 208 (L) 03/07/2016    Lab Results  Component Value Date   TSH 1.259 08/30/2015       Component Value Date/Time   CHOL 195 12/03/2015 1444   HDL 38 (L) 12/03/2015 1444   CHOLHDL 5.1 (H) 12/03/2015 1444   LDLCALC 104 (H) 12/03/2015 1444    Lab Results  Component Value Date   AST 26 04/02/2015   Lab Results  Component Value Date   ALT 33 04/02/2015   PSA 0.3 ng/mL on 09/02/2016   Assessment & Plan:    1. Hypogonadism:     -most recent testosterone level is 217 ng/dL on 09/02/2016  -continue Testopel insertion-scheduled for next week  -RTC in 3 months for HCT and testosterone  -RTC in 6 months for HCT, testosterone, PSA,  LFT's, ADAM and exam  2. BPH with LUTS  - IPSS score is 12/1, it is worsening  - Continue conservative management, avoiding bladder irritants and timed voiding's  - Initiate alpha-blocker (Rapaflo 8 mg daily), discussed side effects, samples given  - RTC in 6 months for IPSS, PSA and exam, as testosterone therapy can cause prostate enlargement and worsen LUTS  3. Erectile dysfunction:     -SHIM score is 10  -continue Cialis 20 mg on demand dosing, refill given  -RTC in 6 months for SHIM score and exam, as testosterone therapy can affect erections   Return for schedule Testopel.  These notes generated with voice recognition software. I apologize for typographical errors.  Zara Council, [facility] [address] [place], [place] [address] [phone]

## 2016-09-03 LAB — PSA: Prostate Specific Ag, Serum: 0.3 ng/mL (ref 0.0–4.0)

## 2016-09-03 LAB — TESTOSTERONE: Testosterone: 217 ng/dL — ABNORMAL LOW (ref 264–916)

## 2016-09-03 LAB — HEMATOCRIT: Hematocrit: 37.1 % — ABNORMAL LOW (ref 37.5–51.0)

## 2016-09-19 ENCOUNTER — Ambulatory Visit (INDEPENDENT_AMBULATORY_CARE_PROVIDER_SITE_OTHER): Payer: 59 | Admitting: Urology

## 2016-09-19 ENCOUNTER — Encounter: Payer: Self-pay | Admitting: Urology

## 2016-09-19 VITALS — BP 124/78 | HR 67 | Ht 74.0 in | Wt 200.4 lb

## 2016-09-19 DIAGNOSIS — E291 Testicular hypofunction: Secondary | ICD-10-CM | POA: Diagnosis not present

## 2016-09-19 MED ORDER — TESTOSTERONE 75 MG IL PLLT
75.0000 mg | PELLET | Freq: Once | Status: AC
  Administered 2016-09-19: 75 mg

## 2016-09-19 NOTE — Progress Notes (Signed)
This is a 50 -year-old male with hypogonadism and he is managed with Testopel. He presents today for Testopel insertion.  Patient is placed on the exam table in the left lateral jackknife position.  Identified upper outer quadrant of hip for insertion; prepped area with Betadine and injected 10 cc's of Lidocaine 1% with Epinephrine to anesthetize superficially and distally along trocar tract.  Made 3 mm incision using 15 blade of scalpel; trocar with sharp ended stylet was inserted into subcutaneous tissue in line with femur. Sharp stylet was withdrawn and 6 pellets were placed into trocar well. Testopel pellets advanced into tissue using blunt ended stylet. Trocar removed and incision closed using 6 Steri-Strips. Cleansed area to remove Betadine and covered Steri-Strips with outer Band-Aid.  Careful inspection of insertion is done and patient informed of post procedure instructions.  He will return in three month for serum testosterone and HCT

## 2016-10-03 ENCOUNTER — Ambulatory Visit: Payer: Self-pay | Admitting: Family Medicine

## 2016-10-15 ENCOUNTER — Other Ambulatory Visit: Payer: Self-pay | Admitting: Family Medicine

## 2016-10-17 NOTE — Telephone Encounter (Signed)
Patient needs to come in for follow-up. We'll give a one month refill.

## 2016-10-17 NOTE — Telephone Encounter (Signed)
LM for patient to call 

## 2016-10-17 NOTE — Telephone Encounter (Signed)
Please advise on refill.

## 2016-11-17 ENCOUNTER — Telehealth: Payer: Self-pay | Admitting: Urology

## 2016-11-17 NOTE — Telephone Encounter (Signed)
error 

## 2016-11-19 ENCOUNTER — Other Ambulatory Visit: Payer: Self-pay | Admitting: Nurse Practitioner

## 2016-11-19 ENCOUNTER — Other Ambulatory Visit: Payer: Self-pay | Admitting: Family Medicine

## 2016-11-19 NOTE — Telephone Encounter (Signed)
Left msg to schedule appt

## 2016-11-19 NOTE — Telephone Encounter (Signed)
Refill given. Please fax. Patient needs follow-up for further refills. Thanks.

## 2016-11-19 NOTE — Telephone Encounter (Signed)
Please advise 

## 2016-11-19 NOTE — Telephone Encounter (Signed)
Can you please call and schedule patient for follow up.

## 2016-12-19 ENCOUNTER — Other Ambulatory Visit: Payer: 59

## 2016-12-19 DIAGNOSIS — M791 Myalgia: Secondary | ICD-10-CM | POA: Diagnosis not present

## 2016-12-19 DIAGNOSIS — M542 Cervicalgia: Secondary | ICD-10-CM | POA: Diagnosis not present

## 2016-12-19 DIAGNOSIS — E291 Testicular hypofunction: Secondary | ICD-10-CM | POA: Diagnosis not present

## 2016-12-19 DIAGNOSIS — M25552 Pain in left hip: Secondary | ICD-10-CM | POA: Diagnosis not present

## 2016-12-19 DIAGNOSIS — M461 Sacroiliitis, not elsewhere classified: Secondary | ICD-10-CM | POA: Diagnosis not present

## 2016-12-19 DIAGNOSIS — Z79899 Other long term (current) drug therapy: Secondary | ICD-10-CM

## 2016-12-19 DIAGNOSIS — M545 Low back pain: Secondary | ICD-10-CM | POA: Diagnosis not present

## 2016-12-19 DIAGNOSIS — M50222 Other cervical disc displacement at C5-C6 level: Secondary | ICD-10-CM | POA: Diagnosis not present

## 2016-12-19 DIAGNOSIS — M25551 Pain in right hip: Secondary | ICD-10-CM | POA: Diagnosis not present

## 2016-12-19 DIAGNOSIS — M50322 Other cervical disc degeneration at C5-C6 level: Secondary | ICD-10-CM | POA: Diagnosis not present

## 2016-12-20 LAB — ESTRADIOL: Estradiol: 6 pg/mL — ABNORMAL LOW (ref 7.6–42.6)

## 2016-12-23 ENCOUNTER — Telehealth: Payer: Self-pay

## 2016-12-23 NOTE — Telephone Encounter (Signed)
Labs were added at [facility]. Will call pt if needed after getting results.

## 2016-12-23 NOTE — Telephone Encounter (Signed)
-----   Message from Nori Riis, PA-C sent at 12/22/2016  9:03 PM EST ----- Patient was to have a serum testosterone and hematocrit drawn, not an estradiol level. Please call [facility] and make the correction.  Unfortunately, I feel the patient will need to return to have another blood drawn as he most likely did not have a lavender top drawn.

## 2016-12-25 LAB — TESTOSTERONE, TOTAL, LC/MS: Testosterone, total: 283.5 ng/dL (ref 264.0–916.0)

## 2016-12-25 LAB — SPECIMEN STATUS REPORT

## 2016-12-29 DIAGNOSIS — M50322 Other cervical disc degeneration at C5-C6 level: Secondary | ICD-10-CM | POA: Diagnosis not present

## 2016-12-29 DIAGNOSIS — M545 Low back pain: Secondary | ICD-10-CM | POA: Diagnosis not present

## 2016-12-29 DIAGNOSIS — M50222 Other cervical disc displacement at C5-C6 level: Secondary | ICD-10-CM | POA: Diagnosis not present

## 2016-12-29 DIAGNOSIS — M791 Myalgia: Secondary | ICD-10-CM | POA: Diagnosis not present

## 2016-12-29 DIAGNOSIS — M25551 Pain in right hip: Secondary | ICD-10-CM | POA: Diagnosis not present

## 2016-12-29 DIAGNOSIS — M542 Cervicalgia: Secondary | ICD-10-CM | POA: Diagnosis not present

## 2016-12-29 DIAGNOSIS — M461 Sacroiliitis, not elsewhere classified: Secondary | ICD-10-CM | POA: Diagnosis not present

## 2016-12-29 DIAGNOSIS — M25552 Pain in left hip: Secondary | ICD-10-CM | POA: Diagnosis not present

## 2016-12-30 ENCOUNTER — Ambulatory Visit (INDEPENDENT_AMBULATORY_CARE_PROVIDER_SITE_OTHER): Payer: Self-pay | Admitting: Family Medicine

## 2016-12-30 ENCOUNTER — Telehealth: Payer: Self-pay | Admitting: Family Medicine

## 2016-12-30 DIAGNOSIS — Z0289 Encounter for other administrative examinations: Secondary | ICD-10-CM

## 2016-12-30 NOTE — Telephone Encounter (Signed)
FYI - Pt called and cancelled appt, he is not feeling well. He just needs a refill on his sertraline (ZOLOFT) 100 MG tablet. Please advise, thank you!  Call pt @ [phone]

## 2016-12-30 NOTE — Telephone Encounter (Signed)
Please advise 

## 2016-12-31 MED ORDER — SERTRALINE HCL 100 MG PO TABS: ORAL_TABLET | ORAL | 1 refills | Status: DC

## 2016-12-31 NOTE — Telephone Encounter (Signed)
noted 

## 2016-12-31 NOTE — Telephone Encounter (Signed)
Sent to pharmacy 

## 2017-01-12 DIAGNOSIS — M791 Myalgia: Secondary | ICD-10-CM | POA: Diagnosis not present

## 2017-01-12 DIAGNOSIS — M50322 Other cervical disc degeneration at C5-C6 level: Secondary | ICD-10-CM | POA: Diagnosis not present

## 2017-01-12 DIAGNOSIS — M50222 Other cervical disc displacement at C5-C6 level: Secondary | ICD-10-CM | POA: Diagnosis not present

## 2017-01-12 DIAGNOSIS — M461 Sacroiliitis, not elsewhere classified: Secondary | ICD-10-CM | POA: Diagnosis not present

## 2017-01-16 DIAGNOSIS — M542 Cervicalgia: Secondary | ICD-10-CM | POA: Diagnosis not present

## 2017-01-16 DIAGNOSIS — M791 Myalgia: Secondary | ICD-10-CM | POA: Diagnosis not present

## 2017-01-16 DIAGNOSIS — M50322 Other cervical disc degeneration at C5-C6 level: Secondary | ICD-10-CM | POA: Diagnosis not present

## 2017-01-16 DIAGNOSIS — M461 Sacroiliitis, not elsewhere classified: Secondary | ICD-10-CM | POA: Diagnosis not present

## 2017-01-16 DIAGNOSIS — M50222 Other cervical disc displacement at C5-C6 level: Secondary | ICD-10-CM | POA: Diagnosis not present

## 2017-01-27 DIAGNOSIS — M461 Sacroiliitis, not elsewhere classified: Secondary | ICD-10-CM | POA: Diagnosis not present

## 2017-01-27 DIAGNOSIS — M50322 Other cervical disc degeneration at C5-C6 level: Secondary | ICD-10-CM | POA: Diagnosis not present

## 2017-01-27 DIAGNOSIS — M50222 Other cervical disc displacement at C5-C6 level: Secondary | ICD-10-CM | POA: Diagnosis not present

## 2017-01-27 DIAGNOSIS — M791 Myalgia: Secondary | ICD-10-CM | POA: Diagnosis not present

## 2017-01-30 DIAGNOSIS — M461 Sacroiliitis, not elsewhere classified: Secondary | ICD-10-CM | POA: Diagnosis not present

## 2017-01-30 DIAGNOSIS — M50222 Other cervical disc displacement at C5-C6 level: Secondary | ICD-10-CM | POA: Diagnosis not present

## 2017-01-30 DIAGNOSIS — M791 Myalgia: Secondary | ICD-10-CM | POA: Diagnosis not present

## 2017-01-30 DIAGNOSIS — M542 Cervicalgia: Secondary | ICD-10-CM | POA: Diagnosis not present

## 2017-01-30 DIAGNOSIS — M50322 Other cervical disc degeneration at C5-C6 level: Secondary | ICD-10-CM | POA: Diagnosis not present

## 2017-02-03 DIAGNOSIS — M461 Sacroiliitis, not elsewhere classified: Secondary | ICD-10-CM | POA: Diagnosis not present

## 2017-02-03 DIAGNOSIS — M791 Myalgia: Secondary | ICD-10-CM | POA: Diagnosis not present

## 2017-02-03 DIAGNOSIS — M50222 Other cervical disc displacement at C5-C6 level: Secondary | ICD-10-CM | POA: Diagnosis not present

## 2017-02-03 DIAGNOSIS — M50322 Other cervical disc degeneration at C5-C6 level: Secondary | ICD-10-CM | POA: Diagnosis not present

## 2017-02-06 ENCOUNTER — Other Ambulatory Visit: Payer: Self-pay | Admitting: Urology

## 2017-02-09 DIAGNOSIS — M5127 Other intervertebral disc displacement, lumbosacral region: Secondary | ICD-10-CM | POA: Diagnosis not present

## 2017-02-09 DIAGNOSIS — M5137 Other intervertebral disc degeneration, lumbosacral region: Secondary | ICD-10-CM | POA: Diagnosis not present

## 2017-02-09 DIAGNOSIS — M542 Cervicalgia: Secondary | ICD-10-CM | POA: Diagnosis not present

## 2017-02-09 DIAGNOSIS — M791 Myalgia: Secondary | ICD-10-CM | POA: Diagnosis not present

## 2017-02-09 DIAGNOSIS — M461 Sacroiliitis, not elsewhere classified: Secondary | ICD-10-CM | POA: Diagnosis not present

## 2017-02-13 ENCOUNTER — Other Ambulatory Visit: Payer: Self-pay | Admitting: Urology

## 2017-02-13 ENCOUNTER — Telehealth: Payer: Self-pay | Admitting: *Deleted

## 2017-02-13 MED ORDER — SILDENAFIL CITRATE 20 MG PO TABS: ORAL_TABLET | ORAL | 3 refills | Status: DC

## 2017-02-13 NOTE — Telephone Encounter (Signed)
Patient called stating [facility] called [name] yesterday and left [place] a message about his medication. I let patient know I didn't see any message and he states insurance doesn't cover cialis anymore and they wanted to switch it to the generic viagra. I let the patient know that I would forward the message to [facility] and that it would probably be Monday. Patient disappointed but ok with the plan.

## 2017-02-13 NOTE — Telephone Encounter (Signed)
I have sent generic viagra to [facility].

## 2017-02-17 DIAGNOSIS — M5137 Other intervertebral disc degeneration, lumbosacral region: Secondary | ICD-10-CM | POA: Diagnosis not present

## 2017-02-17 DIAGNOSIS — M791 Myalgia: Secondary | ICD-10-CM | POA: Diagnosis not present

## 2017-02-17 DIAGNOSIS — M461 Sacroiliitis, not elsewhere classified: Secondary | ICD-10-CM | POA: Diagnosis not present

## 2017-02-17 DIAGNOSIS — M5127 Other intervertebral disc displacement, lumbosacral region: Secondary | ICD-10-CM | POA: Diagnosis not present

## 2017-02-17 NOTE — Telephone Encounter (Signed)
Patient called on February 23rd. Patient notified medication was at the pharmacy.

## 2017-02-19 ENCOUNTER — Encounter: Payer: Self-pay | Admitting: Family Medicine

## 2017-02-19 ENCOUNTER — Ambulatory Visit (INDEPENDENT_AMBULATORY_CARE_PROVIDER_SITE_OTHER): Payer: 59 | Admitting: Family Medicine

## 2017-02-19 ENCOUNTER — Emergency Department: Payer: 59

## 2017-02-19 ENCOUNTER — Encounter: Payer: Self-pay | Admitting: Emergency Medicine

## 2017-02-19 ENCOUNTER — Observation Stay
Admission: EM | Admit: 2017-02-19 | Discharge: 2017-02-20 | Disposition: A | Discharge status: Home / Self Care | Payer: 59 | Attending: Internal Medicine | Admitting: Internal Medicine

## 2017-02-19 ENCOUNTER — Observation Stay: Payer: 59

## 2017-02-19 VITALS — BP 128/80 | HR 78 | Temp 98.6°F | Wt 208.6 lb

## 2017-02-19 DIAGNOSIS — K219 Gastro-esophageal reflux disease without esophagitis: Secondary | ICD-10-CM | POA: Diagnosis not present

## 2017-02-19 DIAGNOSIS — F329 Major depressive disorder, single episode, unspecified: Secondary | ICD-10-CM | POA: Diagnosis not present

## 2017-02-19 DIAGNOSIS — L409 Psoriasis, unspecified: Secondary | ICD-10-CM | POA: Insufficient documentation

## 2017-02-19 DIAGNOSIS — F4323 Adjustment disorder with mixed anxiety and depressed mood: Secondary | ICD-10-CM | POA: Diagnosis not present

## 2017-02-19 DIAGNOSIS — E291 Testicular hypofunction: Secondary | ICD-10-CM | POA: Insufficient documentation

## 2017-02-19 DIAGNOSIS — G629 Polyneuropathy, unspecified: Secondary | ICD-10-CM | POA: Diagnosis not present

## 2017-02-19 DIAGNOSIS — Z885 Allergy status to narcotic agent status: Secondary | ICD-10-CM | POA: Diagnosis not present

## 2017-02-19 DIAGNOSIS — Z888 Allergy status to other drugs, medicaments and biological substances status: Secondary | ICD-10-CM | POA: Insufficient documentation

## 2017-02-19 DIAGNOSIS — G458 Other transient cerebral ischemic attacks and related syndromes: Secondary | ICD-10-CM | POA: Diagnosis not present

## 2017-02-19 DIAGNOSIS — Z820 Family history of epilepsy and other diseases of the nervous system: Secondary | ICD-10-CM | POA: Insufficient documentation

## 2017-02-19 DIAGNOSIS — R200 Anesthesia of skin: Secondary | ICD-10-CM | POA: Insufficient documentation

## 2017-02-19 DIAGNOSIS — S0990XA Unspecified injury of head, initial encounter: Secondary | ICD-10-CM | POA: Diagnosis not present

## 2017-02-19 DIAGNOSIS — J0100 Acute maxillary sinusitis, unspecified: Secondary | ICD-10-CM | POA: Insufficient documentation

## 2017-02-19 DIAGNOSIS — M5412 Radiculopathy, cervical region: Secondary | ICD-10-CM | POA: Diagnosis not present

## 2017-02-19 DIAGNOSIS — R740 Nonspecific elevation of levels of transaminase and lactic acid dehydrogenase [LDH]: Secondary | ICD-10-CM | POA: Insufficient documentation

## 2017-02-19 DIAGNOSIS — I10 Essential (primary) hypertension: Secondary | ICD-10-CM | POA: Diagnosis not present

## 2017-02-19 DIAGNOSIS — Z87442 Personal history of urinary calculi: Secondary | ICD-10-CM | POA: Diagnosis not present

## 2017-02-19 DIAGNOSIS — Z8249 Family history of ischemic heart disease and other diseases of the circulatory system: Secondary | ICD-10-CM | POA: Diagnosis not present

## 2017-02-19 DIAGNOSIS — Z79899 Other long term (current) drug therapy: Secondary | ICD-10-CM | POA: Insufficient documentation

## 2017-02-19 DIAGNOSIS — G43909 Migraine, unspecified, not intractable, without status migrainosus: Secondary | ICD-10-CM | POA: Diagnosis not present

## 2017-02-19 DIAGNOSIS — Z881 Allergy status to other antibiotic agents status: Secondary | ICD-10-CM | POA: Insufficient documentation

## 2017-02-19 DIAGNOSIS — R29818 Other symptoms and signs involving the nervous system: Secondary | ICD-10-CM | POA: Diagnosis not present

## 2017-02-19 DIAGNOSIS — G4733 Obstructive sleep apnea (adult) (pediatric): Secondary | ICD-10-CM | POA: Diagnosis not present

## 2017-02-19 DIAGNOSIS — Z823 Family history of stroke: Secondary | ICD-10-CM | POA: Insufficient documentation

## 2017-02-19 DIAGNOSIS — F809 Developmental disorder of speech and language, unspecified: Secondary | ICD-10-CM | POA: Diagnosis present

## 2017-02-19 DIAGNOSIS — N400 Enlarged prostate without lower urinary tract symptoms: Secondary | ICD-10-CM | POA: Insufficient documentation

## 2017-02-19 DIAGNOSIS — Z833 Family history of diabetes mellitus: Secondary | ICD-10-CM | POA: Insufficient documentation

## 2017-02-19 DIAGNOSIS — Z7982 Long term (current) use of aspirin: Secondary | ICD-10-CM | POA: Insufficient documentation

## 2017-02-19 DIAGNOSIS — G459 Transient cerebral ischemic attack, unspecified: Principal | ICD-10-CM | POA: Insufficient documentation

## 2017-02-19 DIAGNOSIS — R0602 Shortness of breath: Secondary | ICD-10-CM | POA: Diagnosis not present

## 2017-02-19 DIAGNOSIS — R040 Epistaxis: Secondary | ICD-10-CM | POA: Insufficient documentation

## 2017-02-19 DIAGNOSIS — R0609 Other forms of dyspnea: Secondary | ICD-10-CM

## 2017-02-19 LAB — URINE DRUG SCREEN, QUALITATIVE (ARMC ONLY)
Amphetamines, Ur Screen: NOT DETECTED
BARBITURATES, UR SCREEN: NOT DETECTED
Benzodiazepine, Ur Scrn: NOT DETECTED
CANNABINOID 50 NG, UR ~~LOC~~: NOT DETECTED
Cocaine Metabolite,Ur ~~LOC~~: NOT DETECTED
MDMA (Ecstasy)Ur Screen: NOT DETECTED
Methadone Scn, Ur: NOT DETECTED
Opiate, Ur Screen: NOT DETECTED
PHENCYCLIDINE (PCP) UR S: NOT DETECTED
Tricyclic, Ur Screen: NOT DETECTED

## 2017-02-19 LAB — URINALYSIS, ROUTINE W REFLEX MICROSCOPIC
Bilirubin Urine: NEGATIVE
GLUCOSE, UA: NEGATIVE mg/dL
HGB URINE DIPSTICK: NEGATIVE
KETONES UR: NEGATIVE mg/dL
Leukocytes, UA: NEGATIVE
Nitrite: NEGATIVE
PROTEIN: NEGATIVE mg/dL
Specific Gravity, Urine: 1.018 (ref 1.005–1.030)
pH: 6 (ref 5.0–8.0)

## 2017-02-19 LAB — COMPREHENSIVE METABOLIC PANEL
ALT: 53 U/L (ref 17–63)
ANION GAP: 2 — AB (ref 5–15)
AST: 43 U/L — AB (ref 15–41)
Albumin: 5.1 g/dL — ABNORMAL HIGH (ref 3.5–5.0)
Alkaline Phosphatase: 77 U/L (ref 38–126)
BUN: 18 mg/dL (ref 6–20)
CHLORIDE: 109 mmol/L (ref 101–111)
CO2: 28 mmol/L (ref 22–32)
Calcium: 9.4 mg/dL (ref 8.9–10.3)
Creatinine, Ser: 1.13 mg/dL (ref 0.61–1.24)
Glucose, Bld: 86 mg/dL (ref 65–99)
POTASSIUM: 4 mmol/L (ref 3.5–5.1)
Sodium: 139 mmol/L (ref 135–145)
Total Bilirubin: 0.8 mg/dL (ref 0.3–1.2)
Total Protein: 7.4 g/dL (ref 6.5–8.1)

## 2017-02-19 LAB — CBC WITH DIFFERENTIAL/PLATELET
BASOS ABS: 0.1 10*3/uL (ref 0–0.1)
Basophils Relative: 1 %
Eosinophils Absolute: 0.4 10*3/uL (ref 0–0.7)
Eosinophils Relative: 6 %
HCT: 41.4 % (ref 40.0–52.0)
Hemoglobin: 14.4 g/dL (ref 13.0–18.0)
LYMPHS PCT: 18 %
Lymphs Abs: 1.2 10*3/uL (ref 1.0–3.6)
MCH: 28.1 pg (ref 26.0–34.0)
MCHC: 34.6 g/dL (ref 32.0–36.0)
MCV: 81.1 fL (ref 80.0–100.0)
MONO ABS: 0.5 10*3/uL (ref 0.2–1.0)
Monocytes Relative: 7 %
Neutro Abs: 4.4 10*3/uL (ref 1.4–6.5)
Neutrophils Relative %: 68 %
PLATELETS: 178 10*3/uL (ref 150–440)
RBC: 5.11 MIL/uL (ref 4.40–5.90)
RDW: 15.7 % — AB (ref 11.5–14.5)
WBC: 6.5 10*3/uL (ref 3.8–10.6)

## 2017-02-19 LAB — PROTIME-INR
INR: 1.02
PROTHROMBIN TIME: 13.4 s (ref 11.4–15.2)

## 2017-02-19 LAB — BRAIN NATRIURETIC PEPTIDE: B Natriuretic Peptide: 22 pg/mL (ref 0.0–100.0)

## 2017-02-19 LAB — GLUCOSE, CAPILLARY: Glucose-Capillary: 92 mg/dL (ref 65–99)

## 2017-02-19 LAB — APTT: APTT: 28 s (ref 24–36)

## 2017-02-19 LAB — TROPONIN I

## 2017-02-19 LAB — ETHANOL: Alcohol, Ethyl (B): <5 mg/dL (ref <5)

## 2017-02-19 MED ORDER — FLUTICASONE PROPIONATE 50 MCG/ACT NA SUSP
2.0000 | Freq: Every day | NASAL | Status: DC
  Administered 2017-02-19 – 2017-02-20 (×2): 2 via NASAL
  Filled 2017-02-19: qty 16

## 2017-02-19 MED ORDER — SENNOSIDES-DOCUSATE SODIUM 8.6-50 MG PO TABS: 1.0000 | ORAL_TABLET | Freq: Every evening | ORAL | Status: DC | PRN

## 2017-02-19 MED ORDER — ENOXAPARIN SODIUM 40 MG/0.4ML ~~LOC~~ SOLN
40.0000 mg | SUBCUTANEOUS | Status: DC
  Filled 2017-02-19: qty 0.4

## 2017-02-19 MED ORDER — ASPIRIN EC 325 MG PO TBEC
325.0000 mg | DELAYED_RELEASE_TABLET | Freq: Once | ORAL | Status: AC
  Administered 2017-02-19: 325 mg via ORAL
  Filled 2017-02-19: qty 1

## 2017-02-19 MED ORDER — ASPIRIN 300 MG RE SUPP: 300.0000 mg | Freq: Every day | RECTAL | Status: DC

## 2017-02-19 MED ORDER — NITROGLYCERIN 0.4 MG SL SUBL: 0.4000 mg | SUBLINGUAL_TABLET | SUBLINGUAL | Status: DC | PRN

## 2017-02-19 MED ORDER — STROKE: EARLY STAGES OF RECOVERY BOOK
Freq: Once | Status: AC
  Administered 2017-02-19: 14:00:00

## 2017-02-19 NOTE — ED Notes (Signed)
Patient left for MRI.

## 2017-02-19 NOTE — ED Notes (Signed)
CODE STROKE CALLED TO [phone] 

## 2017-02-19 NOTE — Progress Notes (Signed)
  Tommi Rumps, MD Phone: [phone]  Daniel Calderon is a 51 y.o. male who presents today for follow-up.  Patient reports over the last week or so his blood pressure has been higher than typical. Notes it is in the 0000000 on the diastolic side. He's had some headaches with this with some associated blurry vision. 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 He notes no focal weakness though overall feels weak. He's had several nosebleeds as well over the last several days. He's been feeling short of breath going up steps though fine when walking around. He does chronically have intermittent chest discomfort and sweating that he attributes to the nerve injury in his neck. The chest discomfort last occurred 2-3 days ago. He notes some trouble breathing when he lays down at night though reports he has sleep apnea. He notes no PND. He also notes progressive memory issues where he has forgotten where he is going at times.  PMH: nonsmoker.   ROS see history of present illness  Objective  Physical Exam Vitals:   02/19/17 0846  BP: 128/80  Pulse: 78  Temp: 98.6 F (37 C)    BP Readings from Last 3 Encounters:  02/19/17 128/80  09/19/16 124/78  09/02/16 133/90   Wt Readings from Last 3 Encounters:  02/19/17 208 lb 9.6 oz (94.6 kg)  09/19/16 200 lb 6.4 oz (90.9 kg)  09/02/16 205 lb 4.8 oz (93.1 kg)    Physical Exam  Constitutional: No distress.  HENT:  Head: Normocephalic and atraumatic.  Mouth/Throat: Oropharynx is clear and moist. No oropharyngeal exudate.  Eyes: Conjunctivae are normal. Pupils are equal, round, and reactive to light.  Cardiovascular: Normal rate, regular rhythm and normal heart sounds.   Pulmonary/Chest: Effort normal and breath sounds normal.  Musculoskeletal: He exhibits no edema.  Neurological: He is alert.  CN 2-12 intact, 5/5 strength in bilateral biceps, triceps, grip,  quads, hamstrings, plantar and dorsiflexion, sensation to light touch intact in bilateral UE and LE, normal gait  Skin: Skin is warm and dry. He is not diaphoretic.     Assessment/Plan: Please see individual problem list.  Exertional shortness of breath Patient presents for evaluation of his blood pressure. Also has had exertional shortness of breath and intermittent headaches. EKG done in the office is reassuring with no ischemic changes. Patient was doing well initially in the office and then was sent for lab work and subsequently developed significant headache and vision changes with left-sided facial numbness. He also developed left-sided chest tightness and shortness of breath. He was reevaluated and was noted to have numbness in left cranial nerve V3 and blurry vision. Sensation slightly different in left arm as well. Sensation intact in the right upper extremity, strength intact in upper extremities. EMS was contacted given his new symptoms. He'll be transported to the emergency room for evaluation.   Orders Placed This Encounter  Procedures  . EKG 12-Lead    Tommi Rumps, MD [place]

## 2017-02-19 NOTE — Progress Notes (Signed)
Pre visit review using our clinic review tool, if applicable. No additional management support is needed unless otherwise documented below in the visit note. 

## 2017-02-19 NOTE — ED Notes (Signed)
Patient transported to MRI 

## 2017-02-19 NOTE — Progress Notes (Signed)
Patient's biggest complaint is when he turns his head to the left.  He states that he feels "something" happening, but is not sure how to describe it.  He has trouble seeing from that side, and has a hard time finding his words.  He says it's not necessarily confusion, but delayed responses.  I reiterate that it's only when he turns his head to the left.

## 2017-02-19 NOTE — ED Notes (Signed)
Dr. Doy Mince at bedside; symptoms resolving; recommends no tPA. Pt states he thinks he might need it due to headache and nosebleeds. Dr. Doy Mince counseling pt that tPA has potential to make nosebleeds worse. Pt states that his BP has been "slowly creeping up" over the last year but that he has not been on BP medication. He reports headache x 2 days that is getting worse. Pt counseled that tPA is not the right medication to treat nosebleeds, BP, fatigue, HA.

## 2017-02-19 NOTE — Patient Instructions (Signed)
Nice to see you. We are going to get you to see cardiology for evaluation of your breathing issues. We will get some lab work today and contact you with the results. If you have persistent breathing issues, or develop chest pain, sweatiness, worsening headache, numbness, weakness, vision changes, or any new or changing symptoms please seek medical attention medially.

## 2017-02-19 NOTE — H&P (Signed)
[facility] NAME: Daniel Calderon    MR#:  [id number]  DATE OF BIRTH:  03/23/1966  DATE OF ADMISSION:  02/19/2017  PRIMARY CARE PHYSICIAN: Tommi Rumps, MD   REQUESTING/REFERRING PHYSICIAN: Cinda Quest  CHIEF COMPLAINT:   Facial numbness and left upper extremity numbness HISTORY OF PRESENT ILLNESS:  Daniel Calderon  is a 51 y.o. male with a known history of Depression, benign prostatic hyperplasia, hypogonadism and other medical problems is presenting to the ED with a chief complaint of facial numbness and left upper extremity weakness. Patient was not feeling well for the past 2 days and went to see his doctor, patient was sent over to the emergency department as he was reporting left facial and left upper extremity numbness. Patient also reported his blood pressure was elevated in doctor's office. Reporting headache and having difficulty to put the words together. Initial CT head is negative and patient was seen and evaluated by neurology in the ED  PAST MEDICAL HISTORY:   Past Medical History:  Diagnosis Date  . Allergy    Seasonal  . Anxiety   . Benign enlargement of prostate   . Depression   . Elevated BP   . Elevated transaminase level   . Failure of erection   . Fatigue   . Frequent headaches   . Hypogonadism in male   . Kidney stones    History of kidney stones  . Migraine   . Sleep apnea    Currently uses cpap    PAST SURGICAL HISTOIRY:   Past Surgical History:  Procedure Laterality Date  . Left Shoulder Surgery  2011  . nerve block     2 in neck. 5 in back.  . NOSE SURGERY    . SCALENE NODE BIOPSY / EXCISION    . VASECTOMY      SOCIAL HISTORY:   Social History  Substance Use Topics  . Smoking status: Never Smoker  . Smokeless tobacco: Never Used  . Alcohol use 1.2 oz/week    2 Glasses of wine per week     Comment: 4 beers per week, during his 2010-2011 daily 2 glasses wine     FAMILY  HISTORY:   Family History  Problem Relation Age of Onset  . Hypertension Mother     Living  . Hearing loss Mother   . Heart disease Other   . Healthy Father     Living  . Diabetes Brother   . Stroke Paternal Uncle   . Hearing loss Maternal Grandmother   . Healthy Son   . Healthy Daughter     DRUG ALLERGIES:   Allergies  Allergen Reactions  . Erythromycin Nausea And Vomiting  . Cephalexin     Other reaction(s): Other (See Comments) Other Reaction: Other reaction  . Dexamethasone     Other reaction(s): Other (See Comments) Other Reaction: Other reaction  . Oxycodone-Acetaminophen     Other reaction(s): Other (See Comments) Other Reaction: Other reaction  . Oxycodone-Acetaminophen     Other reaction(s): Other (See Comments) Other Reaction: Other reaction  . Prednisone     Other reaction(s): Other (See Comments) Other Reaction: Other reaction  . Gabapentin Other (See Comments)    Aggressive  . Ibuprofen Itching    REVIEW OF SYSTEMS:  CONSTITUTIONAL: No fever, fatigue or weakness.  EYES: No blurred or double vision.  EARS, NOSE, AND THROAT: No tinnitus or ear pain.  RESPIRATORY: No cough, shortness of breath, wheezing  or hemoptysis.  CARDIOVASCULAR: No chest pain, orthopnea, edema.  GASTROINTESTINAL: No nausea, vomiting, diarrhea or abdominal pain.  GENITOURINARY: No dysuria, hematuria.  ENDOCRINE: No polyuria, nocturia,  HEMATOLOGY: No anemia, easy bruising or bleeding SKIN: No rash or lesion. MUSCULOSKELETAL: No joint pain or arthritis.   NEUROLOGIC: Reporting numbness of the face and left upper extremity weakness No tingling, numbness, weakness.  PSYCHIATRY: No anxiety or depression.   MEDICATIONS AT HOME:   Prior to Admission medications   Medication Sig Start Date End Date Taking? Authorizing Provider  DULoxetine (CYMBALTA) 30 MG capsule Take 30 mg by mouth daily.  05/16/11  Yes Historical Provider, MD  omeprazole (PRILOSEC) 20 MG capsule TAKE 1 CAPSULE BY  MOUTH DAILY. 11/19/16  Yes [name], MD  fluticasone (FLONASE) 50 MCG/ACT nasal spray Place 2 sprays into both nostrils daily. 07/16/15   Rubbie Battiest, NP  mupirocin ointment (BACTROBAN) 2 % Apply 1 application topically 3 (three) times daily. Patient not taking: Reported on 02/19/2017 05/23/16   Lorin Picket, PA-C  naproxen (NAPROSYN) 500 MG tablet Take 1 tablet (500 mg total) by mouth 2 (two) times daily with a meal. Patient not taking: Reported on 02/19/2017 02/23/16   Roderic Palau D Cuthriell, PA-C  sertraline (ZOLOFT) 100 MG tablet TAKE 1 TABLET (100 MG TOTAL) BY MOUTH DAILY. Patient not taking: Reported on 02/19/2017 12/31/16   [name], MD  sildenafil (REVATIO) 20 MG tablet Take 3 to 5 tablets two hours before intercouse on an empty stomach.  Do not take with nitrates. 02/13/17   Nori Riis, PA-C  Testosterone 75 MG PLLT TESTOPEL, 75MG  (Implant Pellet) - Historical Medication  (75 MG) Active    Historical Provider, MD  Testosterone Propionate POWD Apply topically. Reported on 05/16/2016 05/09/09   Historical Provider, MD      VITAL SIGNS:  Blood pressure (!) 135/94, pulse (!) 55, temperature 98 F (36.7 C), temperature source Oral, resp. rate 19, height 6\' 2"  (1.88 m), weight 94.3 kg (208 lb), SpO2 100 %.  PHYSICAL EXAMINATION:  GENERAL:  51 y.o.-year-old patient lying in the bed with no acute distress.  EYES: Pupils equal, round, reactive to light and accommodation. No scleral icterus. Extraocular muscles intact.  HEENT: Head atraumatic, normocephalic. Oropharynx and nasopharynx clear.  NECK:  Supple, no jugular venous distention. No thyroid enlargement, no tenderness.  LUNGS: Normal breath sounds bilaterally, no wheezing, rales,rhonchi or crepitation. No use of accessory muscles of respiration.  CARDIOVASCULAR: S1, S2 normal. No murmurs, rubs, or gallops.  ABDOMEN: Soft, nontender, nondistended. Bowel sounds present. No organomegaly or mass.  EXTREMITIES: No pedal edema,  cyanosis, or clubbing.  NEUROLOGIC: Cranial nerves II through XII are intact. Muscle strength 5/5 in all extremities.Diminished sensation on the left side of the face Gait not checked.  PSYCHIATRIC: The patient is alert and oriented x 3.  SKIN: No obvious rash, lesion, or ulcer.   LABORATORY PANEL:   CBC  Recent Labs Lab 02/19/17 1012  WBC 6.5  HGB 14.4  HCT 41.4  PLT 178   ------------------------------------------------------------------------------------------------------------------  Chemistries   Recent Labs Lab 02/19/17 1012  NA 139  K 4.0  CL 109  CO2 28  GLUCOSE 86  BUN 18  CREATININE 1.13  CALCIUM 9.4  AST 43*  ALT 53  ALKPHOS 77  BILITOT 0.8   ------------------------------------------------------------------------------------------------------------------  Cardiac Enzymes  Recent Labs Lab 02/19/17 1020  TROPONINI <0.03   ------------------------------------------------------------------------------------------------------------------  RADIOLOGY:  Mr Angiogram Head Wo Contrast  Result Date: 02/19/2017  CLINICAL DATA:  Patient has not been feeling well for 2 days. Acute onset of LEFT facial and LEFT upper extremity numbness with blurred vision. Symptoms are now improving. EXAM: MRI HEAD WITHOUT CONTRAST MRA HEAD WITHOUT CONTRAST TECHNIQUE: Multiplanar, multiecho pulse sequences of the brain and surrounding structures were obtained without intravenous contrast. Angiographic images of the head were obtained using MRA technique without contrast. COMPARISON:  CT head earlier today was negative. FINDINGS: MRI HEAD FINDINGS Brain: No acute infarction, hemorrhage, hydrocephalus, extra-axial collection or mass lesion. Normal cerebral volume. No significant white matter disease. Vascular: Normal flow voids. Skull and upper cervical spine: Normal marrow signal. Sinuses/Orbits: Chronic frontal, maxillary, and ethmoid sinus disease. Other: None. MRA HEAD FINDINGS  Internal carotid arteries are widely patent. Basilar artery is widely patent. Vertebrals are codominant. No intracranial stenosis or aneurysm. Early bifurcation LEFT MCA. IMPRESSION: Negative exam. Electronically Signed   By: Staci Righter M.D.   On: 02/19/2017 13:16   Mr Brain Wo Contrast  Result Date: 02/19/2017 CLINICAL DATA:  Patient has not been feeling well for 2 days. Acute onset of LEFT facial and LEFT upper extremity numbness with blurred vision. Symptoms are now improving. EXAM: MRI HEAD WITHOUT CONTRAST MRA HEAD WITHOUT CONTRAST TECHNIQUE: Multiplanar, multiecho pulse sequences of the brain and surrounding structures were obtained without intravenous contrast. Angiographic images of the head were obtained using MRA technique without contrast. COMPARISON:  CT head earlier today was negative. FINDINGS: MRI HEAD FINDINGS Brain: No acute infarction, hemorrhage, hydrocephalus, extra-axial collection or mass lesion. Normal cerebral volume. No significant white matter disease. Vascular: Normal flow voids. Skull and upper cervical spine: Normal marrow signal. Sinuses/Orbits: Chronic frontal, maxillary, and ethmoid sinus disease. Other: None. MRA HEAD FINDINGS Internal carotid arteries are widely patent. Basilar artery is widely patent. Vertebrals are codominant. No intracranial stenosis or aneurysm. Early bifurcation LEFT MCA. IMPRESSION: Negative exam. Electronically Signed   By: Staci Righter M.D.   On: 02/19/2017 13:16   Ct Head Code Stroke W/o Cm  Result Date: 02/19/2017 CLINICAL DATA:  Code stroke. Blurred vision. Facial numbness and left arm numbness. EXAM: CT HEAD WITHOUT CONTRAST TECHNIQUE: Contiguous axial images were obtained from the base of the skull through the vertex without intravenous contrast. COMPARISON:  CT head 02/23/2016 FINDINGS: Brain: No evidence of acute infarction, hemorrhage, hydrocephalus, extra-axial collection or mass lesion/mass effect. Vascular: Negative for hypervascular  vessel. Skull: Negative Sinuses/Orbits: Mucosal edema in the paranasal sinuses bilaterally. Negative orbit. Other: None ASPECTS ([place] Stroke Program Early CT Score) - Ganglionic level infarction (caudate, lentiform nuclei, internal capsule, insula, M1-M3 cortex): 7 - Supraganglionic infarction (M4-M6 cortex): 3 Total score (0-10 with 10 being normal): 10 IMPRESSION: 1. Negative CT head.  No acute abnormality. 2. ASPECTS is 10 These results were called by telephone at the time of interpretation on 02/19/2017 at 10:26 am to Dr. Conni Slipper , who verbally acknowledged these results. Electronically Signed   By: Franchot Gallo M.D.   On: 02/19/2017 10:27    EKG:   Orders placed or performed during the hospital encounter of 02/19/17  . ED EKG  . ED EKG  . EKG 12-Lead  . EKG 12-Lead    IMPRESSION AND PLAN:   Daniel Calderon  is a 51 y.o. male with a known history of Depression, benign prostatic hyperplasia, hypogonadism and other medical problems is presenting to the ED with a chief complaint of facial numbness and left upper extremity weakness. Patient was not feeling well for the past 2 days and  went to see his doctor, patient was sent over to the emergency department as he was reporting left facial and left upper extremity numbness  # TIA  Admit patient to MedSurg unit CT head is negative We will get stroke workup with MRI of the brain, carotid Dopplers and 2-D echocardiogram Aspirin 325 mg by mouth was given in the emergency department Patient is kept nothing by mouth while awaiting for bedside swallow evaluation We'll check TSH, globin A1c and fasting lipid panel Bedside swallow evaluation is pending PT, OT, speech therapy Patient was seen and evaluated by neurology Continue aspirin rectally while awaiting for the swallow evaluation  #Chronic hypogonadism Patient is on testosterone we'll hold off while patient is nothing by mouth  #obstructive sleep apnea CPAP as needed  #Benign  prostatic hyperplasia Patient is not on any medications at this time  #Depression continue home medications Cymbalta     Provide GI and DVT prophylaxis   All the records are reviewed and case discussed with ED provider. Management plans discussed with the patient, family and they are in agreement.  CODE STATUS: fc, wife is HCPOA  TOTAL TIME TAKING CARE OF THIS PATIENT: 45 minutes.   Note: This dictation was prepared with Dragon dictation along with smaller phrase technology. Any transcriptional errors that result from this process are unintentional.  Nicholes Mango M.D on 02/19/2017 at 1:32 PM  Between 7am to 6pm - Pager - [phone]  After 6pm go to [email or web address] - password [email or web address] [facility] [place]  [facility]  Office  [phone]  CC: Primary care physician; Tommi Rumps, MD

## 2017-02-19 NOTE — ED Triage Notes (Signed)
Pt via ems from [facility] with blurred vision and numbness n left side of face, arm. He has prior injury that causes numbness in face but states this is different. Pt states this began 2 days ago with nosebleed. Pt alert & oriented; states blurry vision is startin g to resolve.

## 2017-02-19 NOTE — Assessment & Plan Note (Addendum)
Patient presents for evaluation of his blood pressure. Also has had exertional shortness of breath and intermittent headaches. EKG done in the office is reassuring with no ischemic changes. Patient was doing well initially in the office and then was sent for lab work and subsequently developed significant headache and vision changes with left-sided facial numbness. He also developed left-sided chest tightness and shortness of breath. He was reevaluated and was noted to have numbness in left cranial nerve V3 and blurry vision. Sensation slightly different in left arm as well. Sensation intact in the right upper extremity, strength intact in upper extremities. EMS was contacted given his new symptoms. He'll be transported to the emergency room for evaluation.

## 2017-02-19 NOTE — ED Provider Notes (Signed)
[facility] Emergency Department Provider Note   ____________________________________________   First MD Initiated Contact with Patient 02/19/17 1011     (approximate)  I have reviewed the triage vital signs and the nursing notes.   HISTORY  Chief Complaint Numbness and Blurred Vision    HPI COLBEN DENNO is a 51 y.o. male reports he hadnosebleed 2 days ago and then today about 9:15 developed blurry vision and numbness on the left side of his face and left arm. Patient says he gets occasional numbness in the face and occasional numbness in the left arm and arm from pinched nerves in his neck. This is different however he is not having blurred vision before. It is currently getting better now.   Past Medical History:  Diagnosis Date  . Allergy    Seasonal  . Anxiety   . Benign enlargement of prostate   . Depression   . Elevated BP   . Elevated transaminase level   . Failure of erection   . Fatigue   . Frequent headaches   . Hypogonadism in male   . Kidney stones    History of kidney stones  . Migraine   . Sleep apnea    Currently uses cpap    Patient Active Problem List   Diagnosis Date Noted  . Exertional shortness of breath 02/19/2017  . TIA (transient ischemic attack) 02/19/2017  . Sinusitis, acute maxillary 07/30/2016  . BPH with obstruction/lower urinary tract symptoms 12/03/2015  . Medication refill 09/13/2015  . Erectile disorder, generalized, mild 07/26/2015  . Hypogonadism in male 05/24/2015  . Environmental allergies 04/13/2015  . Encounter to establish care 04/13/2015  . Sleep apnea 04/13/2015  . Migraines 04/13/2015  . Neck pain 04/13/2015  . Adjustment disorder with mixed anxiety and depressed mood 04/13/2015  . Psoriasis 04/13/2015  . History of kidney stones 04/13/2015  . Neuropathy ([place]) 04/13/2015  . Benign fibroma of prostate 03/24/2015  . Blood pressure elevated 03/24/2015  . Elevation of level of  transaminase or lactic acid dehydrogenase (LDH) 03/24/2015  . Failure of erection 03/24/2015  . Fatigue 03/24/2015  . 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 Left Shoulder Surgery  2011  . nerve block     2 in neck. 5 in back.  . NOSE SURGERY    . SCALENE NODE BIOPSY / EXCISION    . VASECTOMY      Prior to Admission medications   Medication Sig Start Date End Date Taking? Authorizing Provider  DULoxetine (CYMBALTA) 30 MG capsule Take 30 mg by mouth daily.  05/16/11  Yes Historical Provider, MD  Multiple Vitamin (MULTIVITAMIN) tablet Take 1 tablet by mouth daily.   Yes Historical Provider, MD  omega-3 acid ethyl esters (LOVAZA) 1 g capsule Take 1 g by mouth daily.   Yes Historical Provider, MD  omeprazole (PRILOSEC) 20 MG capsule TAKE 1 CAPSULE BY MOUTH DAILY. 11/19/16  Yes [name], MD  fluticasone (FLONASE) 50 MCG/ACT nasal spray Place 2 sprays into both nostrils daily. 07/16/15   Rubbie Battiest, NP  mupirocin ointment (BACTROBAN) 2 % Apply 1 application topically 3 (three) times daily. Patient not taking: Reported on 02/19/2017 05/23/16   Lorin Picket, PA-C  naproxen (NAPROSYN) 500 MG tablet Take 1 tablet (500 mg total) by mouth 2 (two) times daily with a meal. Patient not taking: Reported on 02/19/2017 02/23/16   Charline Bills Cuthriell, PA-C  sertraline (ZOLOFT) 100 MG  tablet TAKE 1 TABLET (100 MG TOTAL) BY MOUTH DAILY. Patient not taking: Reported on 02/19/2017 12/31/16   [name], MD  sildenafil (REVATIO) 20 MG tablet Take 3 to 5 tablets two hours before intercouse on an empty stomach.  Do not take with nitrates. 02/13/17   Nori Riis, PA-C  Testosterone 75 MG PLLT TESTOPEL, 75MG  (Implant Pellet) - Historical Medication  (75 MG) Active    Historical Provider, MD  Testosterone Propionate POWD Apply topically. Reported on 05/16/2016 05/09/09   Historical Provider, MD    Allergies Erythromycin; Cephalexin; Dexamethasone;  Oxycodone-acetaminophen; Oxycodone-acetaminophen; Prednisone; Gabapentin; and Ibuprofen  Family History  Problem Relation Age of Onset  . Hypertension Mother     Living  . Hearing loss Mother   . Heart disease Other   . Healthy Father     Living  . Diabetes Brother   . Stroke Paternal Uncle   . Hearing loss Maternal Grandmother   . Healthy Son   . Healthy Daughter     Social History Social History  Substance Use Topics  . Smoking status: Never Smoker  . Smokeless tobacco: Never Used  . Alcohol use 1.2 oz/week    2 Glasses of wine per week     Comment: 4 beers per week, during his 2010-2011 daily 2 glasses wine     Review of Systems Constitutional: No fever/chills Eyes:visual changesas noted in history of present illness. ENT: No sore throat. Cardiovascular: Denies chest pain. Respiratory: Denies shortness of breath. Gastrointestinal: No abdominal pain.  No nausea, no vomiting.  No diarrhea.  No constipation. Genitourinary: Negative for dysuria. Musculoskeletal: Negative for back pain. Skin: Negative for rash. Neurological: Negative for headaches, focal weakness   10-point ROS otherwise negative.  ____________________________________________   PHYSICAL EXAM:  VITAL SIGNS: ED Triage Vitals  Enc Vitals Group     BP 02/19/17 1009 (!) 159/108     Pulse Rate 02/19/17 1009 75     Resp 02/19/17 1009 18     Temp 02/19/17 1009 98 F (36.7 C)     Temp Source 02/19/17 1009 Oral     SpO2 02/19/17 1009 99 %     Weight 02/19/17 1010 208 lb (94.3 kg)     Height 02/19/17 1010 6\' 2"  (1.88 m)     Head Circumference --      Peak Flow --      Pain Score --      Pain Loc --      Pain Edu? --      Excl. in [place]? --    Constitutional: Alert and oriented. Well appearing and in no acute distress. Eyes: Conjunctivae are normal. PERRL. EOMI. Head: Atraumaticstill slight numbness around the left eye. Nose: No congestion/rhinnorhea. Mouth/Throat: Mucous membranes are moist.   Oropharynx non-erythematous. Neck: No stridor.   Cardiovascular: Normal rate, regular rhythm. Grossly normal heart sounds.  Good peripheral circulation. Respiratory: Normal respiratory effort.  No retractions. Lungs CTAB. Gastrointestinal: Soft and nontender. No distention. No abdominal bruits. No CVA tenderness. Musculoskeletal: No lower extremity tenderness nor edema.  No joint effusions. Neurologic:  Normal speech and language.ranial nervess were not checked and there is still some numbness around the left eye. Other than neck fingers are normal cerebellar finger-nose rapid alternating movements and hands are normalmotor strength is 5 over 5 throughout there is some numbnessremaining in the left arm but much less than previously.. Skin:  Skin is warm, dry and intact. No rash noted. Psychiatric: Mood and affect are normal.  Speech and behavior are normal.  ____________________________________________   LABS (all labs ordered are listed, but only abnormal results are displayed)  Labs Reviewed  URINALYSIS, ROUTINE W REFLEX MICROSCOPIC - Abnormal; Notable for the following:       Result Value   Color, Urine YELLOW (*)    APPearance CLEAR (*)    All other components within normal limits  CBC WITH DIFFERENTIAL/PLATELET - Abnormal; Notable for the following:    RDW 15.7 (*)    All other components within normal limits  COMPREHENSIVE METABOLIC PANEL - Abnormal; Notable for the following:    Albumin 5.1 (*)    AST 43 (*)    Anion gap 2 (*)    All other components within normal limits  PROTIME-INR  APTT  ETHANOL  TROPONIN I  URINE DRUG SCREEN, QUALITATIVE ([facility] ONLY)  BRAIN NATRIURETIC PEPTIDE  HIV ANTIBODY (ROUTINE TESTING)   ____________________________________________  EKG  EKG read and interpreted by me shows normal sinus rhythm at a rate of 67 normal axis no acute ST-T wave changes ____________________________________________  RADIOLOGY  Radiologist calls and reports a CT  of the head is negative. Problems. ____________________________________________   PROCEDURES  Procedure(s) performed:  Procedures  Critical Care performed:  ____________________________________________   INITIAL IMPRESSION / ASSESSMENT AND PLAN / ED COURSE  Pertinent labs & imaging results that were available during my care of the patient were reviewed by me and considered in my medical decision making (see chart for details).   Patient then complains to neurologist for his nosebleed and dyspnea on exertion and feeling sick for 2 or 3 days.     ____________________________________________   FINAL CLINICAL IMPRESSION(S) / ED DIAGNOSES  Final diagnoses:  Transient cerebral ischemia, unspecified type      NEW MEDICATIONS STARTED DURING THIS VISIT:  Current Discharge Medication List       Note:  This document was prepared using Dragon voice recognition software and may include unintentional dictation errors.    Nena Polio, MD 02/19/17 [phone]

## 2017-02-19 NOTE — Consult Note (Signed)
Referring Physician: Cinda Quest    Chief Complaint: Left sided numbness  HPI: Daniel Calderon is an 51 y.o. male who reports that he has not been feeling well for the past 2 days.  Today while at the doctors office had the acute onset of left facial and LUE numbness with blurred vision from the left eye.  Patient reports that his symptoms are improving.  Initial NIHSS of 0.   Reports that for the past year he has noticed that his BP has been slowing creeping up.  Has also noted intermittent fatigue.  He reports that this fatigue has been getting worse with him having difficulty ascending steps for the past two days.  Also reports headaches that have been worse for the past tow days.  Headaches are left sided but usually spontaneously resolve.  Does not usually have associated visual complaints.  Has also had multiple nosebleeds for the past two days as well.  Patient reports that at baseline he has some left facial and LUE numbness but feels the current symptoms are different.    Date last known well: Date: 02/19/2017 Time last known well: Time: 09:00 tPA Given: No: Resolving symptoms  Past Medical History:  Diagnosis Date  . Allergy    Seasonal  . Anxiety   . Benign enlargement of prostate   . Depression   . Elevated BP   . Elevated transaminase level   . Failure of erection   . Fatigue   . Frequent headaches   . Hypogonadism in male   . Kidney stones    History of kidney stones  . Migraine   . Sleep apnea    Currently uses cpap    Past Surgical History:  Procedure Laterality Date  . Left Shoulder Surgery  2011  . nerve block     2 in neck. 5 in back.  . NOSE SURGERY    . SCALENE NODE BIOPSY / EXCISION    . VASECTOMY      Family History  Problem Relation Age of Onset  . Hypertension Mother     Living  . Hearing loss Mother   . Heart disease Other   . Healthy Father     Living  . Diabetes Brother   . Stroke Paternal Uncle   . Hearing loss Maternal Grandmother   .  Healthy Son   . Healthy Daughter    Social History:  reports that he has never smoked. He has never used smokeless tobacco. He reports that he drinks about 1.2 oz of alcohol per week . He reports that he does not use drugs.  Allergies:  Allergies  Allergen Reactions  . Erythromycin Nausea And Vomiting  . Cephalexin     Other reaction(s): Other (See Comments) Other Reaction: Other reaction  . Dexamethasone     Other reaction(s): Other (See Comments) Other Reaction: Other reaction  . Oxycodone-Acetaminophen     Other reaction(s): Other (See Comments) Other Reaction: Other reaction  . Oxycodone-Acetaminophen     Other reaction(s): Other (See Comments) Other Reaction: Other reaction  . Prednisone     Other reaction(s): Other (See Comments) Other Reaction: Other reaction  . Gabapentin Other (See Comments)    Aggressive  . Ibuprofen Itching    Medications: I have reviewed the patient's current medications. Prior to Admission:  Prior to Admission medications   Medication Sig Start Date End Date Taking? Authorizing Provider  DULoxetine (CYMBALTA) 30 MG capsule Take 30 mg by mouth daily.  05/16/11  Yes  Historical Provider, MD  omeprazole (PRILOSEC) 20 MG capsule TAKE 1 CAPSULE BY MOUTH DAILY. 11/19/16  Yes [name], MD  fluticasone (FLONASE) 50 MCG/ACT nasal spray Place 2 sprays into both nostrils daily. 07/16/15   Rubbie Battiest, NP  mupirocin ointment (BACTROBAN) 2 % Apply 1 application topically 3 (three) times daily. Patient not taking: Reported on 02/19/2017 05/23/16   Lorin Picket, PA-C  naproxen (NAPROSYN) 500 MG tablet Take 1 tablet (500 mg total) by mouth 2 (two) times daily with a meal. Patient not taking: Reported on 02/19/2017 02/23/16   Roderic Palau D Cuthriell, PA-C  sertraline (ZOLOFT) 100 MG tablet TAKE 1 TABLET (100 MG TOTAL) BY MOUTH DAILY. Patient not taking: Reported on 02/19/2017 12/31/16   [name], MD  sildenafil (REVATIO) 20 MG tablet Take 3 to 5 tablets  two hours before intercouse on an empty stomach.  Do not take with nitrates. 02/13/17   Nori Riis, PA-C  Testosterone 75 MG PLLT TESTOPEL, 75MG  (Implant Pellet) - Historical Medication  (75 MG) Active    Historical Provider, MD  Testosterone Propionate POWD Apply topically. Reported on 05/16/2016 05/09/09   Historical Provider, MD     ROS: History obtained from the patient  General ROS: as noted in HPI Psychological ROS: anxiety Ophthalmic ROS: as noted in HPI ENT ROS: as noted in HPI Allergy and Immunology ROS: negative for - hives or itchy/watery eyes Hematological and Lymphatic ROS: negative for - bleeding problems, bruising or swollen lymph nodes Endocrine ROS: negative for - galactorrhea, hair pattern changes, polydipsia/polyuria or temperature intolerance Respiratory ROS: negative for - cough, hemoptysis, shortness of breath or wheezing Cardiovascular ROS: as noted in HPI Gastrointestinal ROS: negative for - abdominal pain, diarrhea, hematemesis, nausea/vomiting or stool incontinence Genito-Urinary ROS: negative for - dysuria, hematuria, incontinence or urinary frequency/urgency Musculoskeletal ROS: neck pain Neurological ROS: as noted in HPI Dermatological ROS: negative for rash and skin lesion changes  Physical Examination: Blood pressure (!) 144/98, pulse (!) 56, temperature 98 F (36.7 C), temperature source Oral, resp. rate 13, height 6\' 2"  (1.88 m), weight 94.3 kg (208 lb), SpO2 99 %.  HEENT-  Normocephalic, no lesions, without obvious abnormality.  Normal external eye and conjunctiva.  Normal TM's bilaterally.  Normal auditory canals and external ears. Normal external nose, mucus membranes and septum.  Normal pharynx. Cardiovascular- S1, S2 normal, pulses palpable throughout   Lungs- chest clear, no wheezing, rales, normal symmetric air entry Abdomen- soft, non-tender; bowel sounds normal; no masses,  no organomegaly Extremities- no edema Lymph-no adenopathy  palpable Musculoskeletal-no joint tenderness, deformity or swelling Skin-warm and dry, no hyperpigmentation, vitiligo, or suspicious lesions  Neurological Examination   Mental Status: Alert, oriented, thought content appropriate.  Speech fluent without evidence of aphasia.  Able to follow 3 step commands without difficulty. Cranial Nerves: II: Discs flat bilaterally; Some blurring of the vision from the left eye, pupils equal, round, reactive to light and accommodation III,IV, VI: ptosis not present, extra-ocular motions intact bilaterally V,VII: smile symmetric, facial light touch sensation normal bilaterally VIII: hearing normal bilaterally IX,X: gag reflex present XI: bilateral shoulder shrug XII: midline tongue extension Motor: Right : Upper extremity   5/5    Left:     Upper extremity   5/5  Lower extremity   5/5     Lower extremity   5/5 Tone and bulk:normal tone throughout; no atrophy noted Sensory: Pinprick and light touch intact throughout, bilaterally Deep Tendon Reflexes: 2+ and symmetric throughout Plantars:  Right: downgoing   Left: downgoing Cerebellar: Normal finger-to-nose and normal heel-to-shin testing bilaterally Gait: normal gait and station    Laboratory Studies:  Basic Metabolic Panel: No results for input(s): NA, K, CL, CO2, GLUCOSE, BUN, CREATININE, CALCIUM, MG, PHOS in the last 168 hours.  Liver Function Tests: No results for input(s): AST, ALT, ALKPHOS, BILITOT, PROT, ALBUMIN in the last 168 hours. No results for input(s): LIPASE, AMYLASE in the last 168 hours. No results for input(s): AMMONIA in the last 168 hours.  CBC:  Recent Labs Lab 02/19/17 1012  WBC 6.5  NEUTROABS 4.4  HGB 14.4  HCT 41.4  MCV 81.1  PLT 178    Cardiac Enzymes: No results for input(s): CKTOTAL, CKMB, CKMBINDEX, TROPONINI in the last 168 hours.  BNP: Invalid input(s): POCBNP  CBG: No results for input(s): GLUCAP in the last 168 hours.  Microbiology: No results  found for this or any previous visit.  Coagulation Studies:  Recent Labs  02/19/17 1012  LABPROT 13.4  INR 1.02    Urinalysis: No results for input(s): COLORURINE, LABSPEC, PHURINE, GLUCOSEU, HGBUR, BILIRUBINUR, KETONESUR, PROTEINUR, UROBILINOGEN, NITRITE, LEUKOCYTESUR in the last 168 hours.  Invalid input(s): APPERANCEUR  Lipid Panel:    Component Value Date/Time   CHOL 195 12/03/2015 1444   TRIG 263 (H) 12/03/2015 1444   HDL 38 (L) 12/03/2015 1444   CHOLHDL 5.1 (H) 12/03/2015 1444   LDLCALC 104 (H) 12/03/2015 1444    HgbA1C: No results found for: HGBA1C  Urine Drug Screen:  No results found for: LABOPIA, COCAINSCRNUR, LABBENZ, AMPHETMU, THCU, LABBARB  Alcohol Level: No results for input(s): ETH in the last 168 hours.  Other results: EKG: sinus rhythm at 67 bpm.  Imaging: Ct Head Code Stroke W/o Cm  Result Date: 02/19/2017 CLINICAL DATA:  Code stroke. Blurred vision. Facial numbness and left arm numbness. EXAM: CT HEAD WITHOUT CONTRAST TECHNIQUE: Contiguous axial images were obtained from the base of the skull through the vertex without intravenous contrast. COMPARISON:  CT head 02/23/2016 FINDINGS: Brain: No evidence of acute infarction, hemorrhage, hydrocephalus, extra-axial collection or mass lesion/mass effect. Vascular: Negative for hypervascular vessel. Skull: Negative Sinuses/Orbits: Mucosal edema in the paranasal sinuses bilaterally. Negative orbit. Other: None ASPECTS ([place] Stroke Program Early CT Score) - Ganglionic level infarction (caudate, lentiform nuclei, internal capsule, insula, M1-M3 cortex): 7 - Supraganglionic infarction (M4-M6 cortex): 3 Total score (0-10 with 10 being normal): 10 IMPRESSION: 1. Negative CT head.  No acute abnormality. 2. ASPECTS is 10 These results were called by telephone at the time of interpretation on 02/19/2017 at 10:26 am to Dr. Conni Slipper , who verbally acknowledged these results. Electronically Signed   By: Franchot Gallo M.D.    On: 02/19/2017 10:27    Assessment: 51 y.o. male presenting with left sided numbness and blurring of vision from the left eye.  Symptoms resolving.  BP elevated on presentation.  Unclear if TIA versus migraine.  Patient with multiple other complaints that have been an issue for the past 2 days.  Head CT reviewed and shows no acute changes.  Patient on no antiplatelet therapy at home.    Stroke Risk Factors - hypertension  Plan: 1. HgbA1c, fasting lipid panel 2. MRI, MRA  of the brain without contrast 3. PT consult, OT consult, Speech consult 4. Echocardiogram 5. Carotid dopplers 6. Prophylactic therapy-Antiplatelet med: Aspirin - dose 325mg  daily 7. NPO until RN stroke swallow screen 8. Telemetry monitoring 9. Frequent neuro checks  Case discussed with Dr, Cinda Quest  Alexis Goodell, MD Neurology [phone] 02/19/2017, 10:57 AM

## 2017-02-20 ENCOUNTER — Telehealth: Payer: Self-pay | Admitting: *Deleted

## 2017-02-20 ENCOUNTER — Observation Stay (HOSPITAL_BASED_OUTPATIENT_CLINIC_OR_DEPARTMENT_OTHER)
Admit: 2017-02-20 | Discharge: 2017-02-20 | Disposition: A | Discharge status: Home / Self Care | Payer: 59 | Attending: Internal Medicine | Admitting: Internal Medicine

## 2017-02-20 DIAGNOSIS — F329 Major depressive disorder, single episode, unspecified: Secondary | ICD-10-CM | POA: Diagnosis not present

## 2017-02-20 DIAGNOSIS — N400 Enlarged prostate without lower urinary tract symptoms: Secondary | ICD-10-CM | POA: Diagnosis not present

## 2017-02-20 DIAGNOSIS — Z885 Allergy status to narcotic agent status: Secondary | ICD-10-CM | POA: Diagnosis not present

## 2017-02-20 DIAGNOSIS — I10 Essential (primary) hypertension: Secondary | ICD-10-CM | POA: Diagnosis not present

## 2017-02-20 DIAGNOSIS — M5410 Radiculopathy, site unspecified: Secondary | ICD-10-CM | POA: Diagnosis not present

## 2017-02-20 DIAGNOSIS — G458 Other transient cerebral ischemic attacks and related syndromes: Secondary | ICD-10-CM | POA: Diagnosis not present

## 2017-02-20 DIAGNOSIS — E291 Testicular hypofunction: Secondary | ICD-10-CM | POA: Diagnosis not present

## 2017-02-20 DIAGNOSIS — K219 Gastro-esophageal reflux disease without esophagitis: Secondary | ICD-10-CM | POA: Diagnosis not present

## 2017-02-20 DIAGNOSIS — M5412 Radiculopathy, cervical region: Secondary | ICD-10-CM | POA: Diagnosis not present

## 2017-02-20 DIAGNOSIS — Z881 Allergy status to other antibiotic agents status: Secondary | ICD-10-CM | POA: Diagnosis not present

## 2017-02-20 DIAGNOSIS — G459 Transient cerebral ischemic attack, unspecified: Secondary | ICD-10-CM

## 2017-02-20 DIAGNOSIS — R200 Anesthesia of skin: Secondary | ICD-10-CM | POA: Diagnosis not present

## 2017-02-20 LAB — CBC
HCT: 39.9 % — ABNORMAL LOW (ref 40.0–52.0)
Hemoglobin: 13.7 g/dL (ref 13.0–18.0)
MCH: 27.8 pg (ref 26.0–34.0)
MCHC: 34.3 g/dL (ref 32.0–36.0)
MCV: 81.1 fL (ref 80.0–100.0)
PLATELETS: 185 10*3/uL (ref 150–440)
RBC: 4.92 MIL/uL (ref 4.40–5.90)
RDW: 15.6 % — ABNORMAL HIGH (ref 11.5–14.5)
WBC: 8.5 10*3/uL (ref 3.8–10.6)

## 2017-02-20 LAB — LIPID PANEL
CHOLESTEROL: 184 mg/dL (ref 0–200)
HDL: 36 mg/dL — ABNORMAL LOW (ref >40)
LDL Cholesterol: 108 mg/dL — ABNORMAL HIGH (ref 0–99)
Total CHOL/HDL Ratio: 5.1 RATIO
Triglycerides: 201 mg/dL — ABNORMAL HIGH (ref <150)
VLDL: 40 mg/dL (ref 0–40)

## 2017-02-20 LAB — BASIC METABOLIC PANEL
Anion gap: 7 (ref 5–15)
BUN: 20 mg/dL (ref 6–20)
CO2: 25 mmol/L (ref 22–32)
CREATININE: 1.19 mg/dL (ref 0.61–1.24)
Calcium: 9.2 mg/dL (ref 8.9–10.3)
Chloride: 106 mmol/L (ref 101–111)
GFR calc Af Amer: >60 mL/min (ref >60)
GFR calc non Af Amer: >60 mL/min (ref >60)
Glucose, Bld: 95 mg/dL (ref 65–99)
POTASSIUM: 3.7 mmol/L (ref 3.5–5.1)
SODIUM: 138 mmol/L (ref 135–145)

## 2017-02-20 LAB — ECHOCARDIOGRAM COMPLETE
HEIGHTINCHES: 74 in
WEIGHTICAEL: 3228.8 [oz_av]

## 2017-02-20 LAB — TSH: TSH: 1.987 u[IU]/mL (ref 0.350–4.500)

## 2017-02-20 LAB — TROPONIN I

## 2017-02-20 LAB — HIV ANTIBODY (ROUTINE TESTING W REFLEX): HIV SCREEN 4TH GENERATION: NONREACTIVE

## 2017-02-20 MED ORDER — NITROGLYCERIN 0.4 MG SL SUBL
SUBLINGUAL_TABLET | SUBLINGUAL | Status: AC
  Administered 2017-02-19
  Filled 2017-02-20: qty 3

## 2017-02-20 MED ORDER — ASPIRIN EC 81 MG PO TBEC
81.0000 mg | DELAYED_RELEASE_TABLET | Freq: Every day | ORAL | Status: DC
  Administered 2017-02-20: 10:00:00 81 mg via ORAL
  Filled 2017-02-20: qty 1

## 2017-02-20 MED ORDER — AMLODIPINE BESYLATE 5 MG PO TABS
2.5000 mg | ORAL_TABLET | Freq: Every day | ORAL | Status: DC
  Administered 2017-02-20: 14:00:00 2.5 mg via ORAL
  Filled 2017-02-20: qty 1

## 2017-02-20 MED ORDER — AMLODIPINE BESYLATE 2.5 MG PO TABS: 2.5000 mg | ORAL_TABLET | Freq: Every day | ORAL | 2 refills | Status: DC

## 2017-02-20 MED ORDER — ASPIRIN 81 MG PO TBEC: 81.0000 mg | DELAYED_RELEASE_TABLET | Freq: Every day | ORAL | 2 refills | Status: DC

## 2017-02-20 MED ORDER — KETOROLAC TROMETHAMINE 30 MG/ML IJ SOLN
30.0000 mg | Freq: Once | INTRAMUSCULAR | Status: AC
  Administered 2017-02-20: 30 mg via INTRAVENOUS
  Filled 2017-02-20: qty 1

## 2017-02-20 NOTE — Progress Notes (Signed)
Pt started having new onset chest pain and was diaphoretic.  Vitals stable, blood sugar stable.  After about 10 minutes, he complained of numbness on his left side from head to leg.  MD notified.  Nitroglycerin given, EKG done (NSR), STAT labs drawn.  Pt says his head feels really heavy.

## 2017-02-20 NOTE — Progress Notes (Signed)
PT Cancellation Note  Patient Details Name: WESTLEE WAYMENT MRN: [id number] DOB: 10/10/1966   Cancelled Treatment:    Reason Eval/Treat Not Completed: Other (comment) (Per primary RN, patient now scheduled for discharge; all testing negative, symptoms like result of migraine.  Has been up in room without difficulty or safety concern.  Reports patient returned to baseline, symptoms resolved.  No PT needs at this time. To re-consult should needs change.)   Kendarrius Tanzi H. Owens Shark, PT, DPT, NCS 02/20/17, 1:57 PM [phone]

## 2017-02-20 NOTE — Progress Notes (Signed)
*  PRELIMINARY RESULTS* Echocardiogram 2D Echocardiogram has been performed.  Daniel Calderon 02/20/2017, 1:21 PM

## 2017-02-20 NOTE — Progress Notes (Signed)
Occupational Therapy Evaluation Patient Details Name: VINAY RUSSELLO MRN: [id number] DOB: Dec 28, 1965 Today's Date: 02/20/2017    History of Present Illness Pt. is a 51 y.o. male who was amditted to [facility] with a TIA, and left sided numbness. Pt. PMHx includes: Anxiety, Thoracic Outlet Syndrome, Migraine, Fatigue, Kidney Stones, Sleep Apnea, and Hypogonadism.   Clinical Impression   Pt. Is a 51 y.o. male who was admitted to [facility] with a TIA, and left sided numbness. Pt. Reports symptoms are resolving. Pt. Continues to has Left sided facial numbness. Pt. Reports UEs have been weak with limited ROM proximally since his surgeries several years ago for Thoracic Outlet Syndrome. Pt. Reports no deficits with ADLs warranted further OT services. OT services will be discharged, and order to be completed.    Follow Up Recommendations  No OT follow up    Equipment Recommendations       Recommendations for Other Services        Precautions / Restrictions                                                       ADL Overall ADL's : Independent                                             Vision Patient Visual Report: No change from baseline (Initially had blurriness in lefteye. Reports it has resolved since taking nitroglycerin.)       Perception     Praxis      Pertinent Vitals/Pain Pain Assessment: No/denies pain     Hand Dominance Right   Extremity/Trunk Assessment Upper Extremity Assessment Upper Extremity Assessment: Generalized weakness (Limited Bilateral shoulder ROM form previous surgeries. in the past for thoracic outlet syndrome.)           Communication Communication Communication: No difficulties   Cognition Arousal/Alertness: Awake/alert Behavior During Therapy: WFL for tasks assessed/performed Overall Cognitive Status: Within Functional Limits for tasks assessed                     General Comments        Exercises       Shoulder Instructions      Home Living Family/patient expects to be discharged to:: Private residence Living Arrangements: Spouse/significant other Available Help at Discharge: Family Type of Home: House Home Access: Stairs to enter Technical brewer of Steps: 4 Entrance Stairs-Rails: Left;Right Home Layout: One level     Bathroom Shower/Tub: Tub/shower unit         Home Equipment: Hand held shower head          Prior Functioning/Environment Level of Independence: Independent                 OT Problem List:        OT Treatment/Interventions:      OT Goals(Current goals can be found in the care plan section) Acute Rehab OT Goals Patient Stated Goal: To find out when hi last test is, and leave directly afterwards. OT Goal Formulation: With patient Potential to Achieve Goals: Good  OT Frequency:     Barriers to D/C:            Co-evaluation  End of Session    Activity Tolerance: Patient tolerated treatment well Patient left: in bed;with call bell/phone within reach;with bed alarm set                   ADL either performed or assessed with clinical judgement  Time: [id number] OT Time Calculation (min): 20 min Charges:  OT General Charges $OT Visit: 1 Procedure OT Evaluation $OT Eval Moderate Complexity: 1 Procedure G-Codes:     Harrel Carina, MS, OTR/L  Harrel Carina, MS, OTR/L 02/20/2017, 11:12 AM

## 2017-02-20 NOTE — Progress Notes (Signed)
SLP Cancellation Note  Patient Details Name: Daniel Calderon MRN: [id number] DOB: [birth year]   Cancelled treatment:       Reason Eval/Treat Not Completed: SLP screened, no needs identified, will sign off (Consulted NSG, reviewed chart. ) Consulted pt about any concerns with his speech/langauge and communicating to NSG and family; pt stated he was having trouble last night talking to brother with remembering what he wanted to say- "I noticed whenever my brother was sitting on my left side, I would forget what I was trying to say. As soon as he moved towards the right, I didn't have any more trouble". Pt educated on how his neck nerve surgery could have something to do with that, and invited to ask neurology all questions regarding that. Pt denied any further speech/langauge concerns this morning and was observed effectively communicating with NSG and family member. No further skilled ST services indicated at this time. NSG updated and may re-consult if any change in status.    Eulogio Ditch, B.S Graduate Clinician  02/20/2017, 10:43 AM    This information has been reviewed and agreed upon by this supervising clinician.  Orinda Kenner, [place], CCC-SLP

## 2017-02-20 NOTE — Progress Notes (Signed)
Subjective: Patient reports that he is back to baseline.  BP improved. No further headache.    Objective: Current vital signs: BP 139/87 (BP Location: Left Arm)   Pulse 67   Temp 97.9 F (36.6 C) (Oral)   Resp 16   Ht 6\' 2"  (1.88 m)   Wt 91.5 kg (201 lb 12.8 oz)   SpO2 97%   BMI 25.91 kg/m  Vital signs in last 24 hours: Temp:  [97.7 F (36.5 C)-98.8 F (37.1 C)] 97.9 F (36.6 C) (03/02 1315) Pulse Rate:  [58-84] 67 (03/02 1315) Resp:  [16-22] 16 (03/02 1315) BP: (124-153)/(83-98) 139/87 (03/02 1315) SpO2:  [97 %-99 %] 97 % (03/02 1315)  Intake/Output from previous day: No intake/output data recorded. Intake/Output this shift: No intake/output data recorded. Nutritional status: Diet Heart Room service appropriate? Yes; Fluid consistency: Thin Diet - low sodium heart healthy  Neurologic Exam: Mental Status: Alert, oriented, thought content appropriate.  Speech fluent without evidence of aphasia.  Able to follow 3 step commands without difficulty. Cranial Nerves: II: Discs flat bilaterally; VFF, pupils equal, round, reactive to light and accommodation III,IV, VI: ptosis not present, extra-ocular motions intact bilaterally V,VII: smile symmetric, facial light touch sensation normal bilaterally VIII: hearing normal bilaterally IX,X: gag reflex present XI: bilateral shoulder shrug XII: midline tongue extension Motor: Right :  Upper extremity   5/5                                      Left:     Upper extremity   5/5             Lower extremity   5/5                                                  Lower extremity   5/5 Tone and bulk:normal tone throughout; no atrophy noted Sensory: Pinprick and light touch intact throughout, bilaterally Deep Tendon Reflexes: 2+ and symmetric throughout  Lab Results: Basic Metabolic Panel:  Recent Labs Lab 02/19/17 1012 02/20/17 0015  NA 139 138  K 4.0 3.7  CL 109 106  CO2 28 25  GLUCOSE 86 95  BUN 18 20  CREATININE 1.13 1.19   CALCIUM 9.4 9.2    Liver Function Tests:  Recent Labs Lab 02/19/17 1012  AST 43*  ALT 53  ALKPHOS 77  BILITOT 0.8  PROT 7.4  ALBUMIN 5.1*   No results for input(s): LIPASE, AMYLASE in the last 168 hours. No results for input(s): AMMONIA in the last 168 hours.  CBC:  Recent Labs Lab 02/19/17 1012 02/20/17 0015  WBC 6.5 8.5  NEUTROABS 4.4  --   HGB 14.4 13.7  HCT 41.4 39.9*  MCV 81.1 81.1  PLT 178 185    Cardiac Enzymes:  Recent Labs Lab 02/19/17 1020 02/20/17 0015 02/20/17 0537 02/20/17 1147  TROPONINI <0.03 <0.03 <0.03 <0.03    Lipid Panel:  Recent Labs Lab 02/20/17 0815  CHOL 184  TRIG 201*  HDL 36*  CHOLHDL 5.1  VLDL 40  LDLCALC 108*    CBG:  Recent Labs Lab 02/19/17 2328  GLUCAP 92    Microbiology: No results found for this or any previous visit.  Coagulation Studies:  Recent Labs  02/19/17 1012  LABPROT 13.4  INR 1.02    Imaging: Mr Angiogram Head Wo Contrast  Result Date: 02/19/2017 CLINICAL DATA:  Patient has not been feeling well for 2 days. Acute onset of LEFT facial and LEFT upper extremity numbness with blurred vision. Symptoms are now improving. EXAM: MRI HEAD WITHOUT CONTRAST MRA HEAD WITHOUT CONTRAST TECHNIQUE: Multiplanar, multiecho pulse sequences of the brain and surrounding structures were obtained without intravenous contrast. Angiographic images of the head were obtained using MRA technique without contrast. COMPARISON:  CT head earlier today was negative. FINDINGS: MRI HEAD FINDINGS Brain: No acute infarction, hemorrhage, hydrocephalus, extra-axial collection or mass lesion. Normal cerebral volume. No significant white matter disease. Vascular: Normal flow voids. Skull and upper cervical spine: Normal marrow signal. Sinuses/Orbits: Chronic frontal, maxillary, and ethmoid sinus disease. Other: None. MRA HEAD FINDINGS Internal carotid arteries are widely patent. Basilar artery is widely patent. Vertebrals are  codominant. No intracranial stenosis or aneurysm. Early bifurcation LEFT MCA. IMPRESSION: Negative exam. Electronically Signed   By: Staci Righter M.D.   On: 02/19/2017 13:16   Mr Brain Wo Contrast  Result Date: 02/19/2017 CLINICAL DATA:  Patient has not been feeling well for 2 days. Acute onset of LEFT facial and LEFT upper extremity numbness with blurred vision. Symptoms are now improving. EXAM: MRI HEAD WITHOUT CONTRAST MRA HEAD WITHOUT CONTRAST TECHNIQUE: Multiplanar, multiecho pulse sequences of the brain and surrounding structures were obtained without intravenous contrast. Angiographic images of the head were obtained using MRA technique without contrast. COMPARISON:  CT head earlier today was negative. FINDINGS: MRI HEAD FINDINGS Brain: No acute infarction, hemorrhage, hydrocephalus, extra-axial collection or mass lesion. Normal cerebral volume. No significant white matter disease. Vascular: Normal flow voids. Skull and upper cervical spine: Normal marrow signal. Sinuses/Orbits: Chronic frontal, maxillary, and ethmoid sinus disease. Other: None. MRA HEAD FINDINGS Internal carotid arteries are widely patent. Basilar artery is widely patent. Vertebrals are codominant. No intracranial stenosis or aneurysm. Early bifurcation LEFT MCA. IMPRESSION: Negative exam. Electronically Signed   By: Staci Righter M.D.   On: 02/19/2017 13:16   US Carotid Bilateral (at [facility] Only)  Result Date: 02/19/2017 CLINICAL DATA:  TIA. EXAM: BILATERAL CAROTID DUPLEX ULTRASOUND TECHNIQUE: Pearline Cables scale imaging, color Doppler and duplex ultrasound were performed of bilateral carotid and vertebral arteries in the neck. COMPARISON:  No recent prior. FINDINGS: Criteria: Quantification of carotid stenosis is based on velocity parameters that correlate the residual internal carotid diameter with NASCET-based stenosis levels, using the diameter of the distal internal carotid lumen as the denominator for stenosis measurement. The  following velocity measurements were obtained: RIGHT ICA:  68/23 cm/sec CCA:  [id number] cm/sec SYSTOLIC ICA/CCA RATIO:  1.0 DIASTOLIC ICA/CCA RATIO:  1.4 ECA:  79 cm/sec LEFT ICA:  65/ 27 cm/sec CCA:  [id number] cm/sec SYSTOLIC ICA/CCA RATIO:  0.7 DIASTOLIC ICA/CCA RATIO:  1.5 ECA:  92 cm/sec RIGHT CAROTID ARTERY: No significant carotid atherosclerotic vascular disease. Minimal plaque at the right carotid bifurcation. No flow limiting stenosis. Degree of stenosis less than 50%. RIGHT VERTEBRAL ARTERY:  Patent with antegrade flow. LEFT CAROTID ARTERY: Mild left carotid atherosclerotic vascular disease. No flow limiting stenosis. Minimal plaque left carotid bifurcation. Degree of stenosis less than 50%. LEFT VERTEBRAL ARTERY:  Patent antegrade flow. IMPRESSION: 1. No significant carotid atherosclerotic vascular disease free. Minimal atherosclerotic plaque both carotid bifurcations. Degree of stenosis less than 50% . 2.  Vertebral arteries are patent with antegrade flow . Electronically Signed   By: Marcello Moores  Register   On:  02/19/2017 15:38   Ct Head Code Stroke W/o Cm  Result Date: 02/19/2017 CLINICAL DATA:  Code stroke. Blurred vision. Facial numbness and left arm numbness. EXAM: CT HEAD WITHOUT CONTRAST TECHNIQUE: Contiguous axial images were obtained from the base of the skull through the vertex without intravenous contrast. COMPARISON:  CT head 02/23/2016 FINDINGS: Brain: No evidence of acute infarction, hemorrhage, hydrocephalus, extra-axial collection or mass lesion/mass effect. Vascular: Negative for hypervascular vessel. Skull: Negative Sinuses/Orbits: Mucosal edema in the paranasal sinuses bilaterally. Negative orbit. Other: None ASPECTS ([place] Stroke Program Early CT Score) - Ganglionic level infarction (caudate, lentiform nuclei, internal capsule, insula, M1-M3 cortex): 7 - Supraganglionic infarction (M4-M6 cortex): 3 Total score (0-10 with 10 being normal): 10 IMPRESSION: 1. Negative CT head.  No acute  abnormality. 2. ASPECTS is 10 These results were called by telephone at the time of interpretation on 02/19/2017 at 10:26 am to Dr. Conni Slipper , who verbally acknowledged these results. Electronically Signed   By: Franchot Gallo M.D.   On: 02/19/2017 10:27    Medications:  I have reviewed the patient's current medications. Scheduled: . amLODipine  2.5 mg Oral Daily  . aspirin EC  81 mg Oral Daily  . enoxaparin (LOVENOX) injection  40 mg Subcutaneous Q24H  . fluticasone  2 spray Each Nare Daily    Assessment/Plan: Patient has returned to baseline.  BP improved.  MRI reviewed and shows no evidence of acute changes.  Although TIA on the differential, can not rule out a migraine equivalent as well as possible etiology.  Carotid dopplers show no evidence of hemodynamically significant stenosis.  Echocardiogram pending.  A1c pending, LDL 108.  Recommendations: 1. Continue ASA 2. Statin for lipid management with target LDL<70. 3. No further neurologic intervention is recommended at this time.  If further questions arise, please call or page at that time.  Thank you for allowing neurology to participate in the care of this patient.  Patient to follow up with neurology on an outpatient basis.      LOS: 0 days   Alexis Goodell, MD Neurology [phone] 02/20/2017  2:48 PM

## 2017-02-20 NOTE — Telephone Encounter (Signed)
Pt will discharge from [facility] on 03/02. Pt has been scheduled for a HFU

## 2017-02-20 NOTE — Discharge Summary (Signed)
[place] [facility] [place] NAME: Daniel Calderon    MR#:  [id number]  DATE OF BIRTH:  03-25-1966  DATE OF ADMISSION:  02/19/2017   ADMITTING PHYSICIAN: Nicholes Mango, MD  DATE OF DISCHARGE: 02/20/2017  PRIMARY CARE PHYSICIAN: Tommi Rumps, MD   ADMISSION DIAGNOSIS:   Left sided numbness [R20.0] Transient cerebral ischemia, unspecified type [G45.9]  DISCHARGE DIAGNOSIS:   Active Problems:   TIA (transient ischemic attack)   SECONDARY DIAGNOSIS:   Past Medical History:  Diagnosis Date  . Allergy    Seasonal  . Anxiety   . Benign enlargement of prostate   . Depression   . Elevated BP   . Elevated transaminase level   . Failure of erection   . Fatigue   . Frequent headaches   . Hypogonadism in male   . Kidney stones    History of kidney stones  . Migraine   . 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  #1 left cervical radiculopathy-MRI of the brain and MRA negative for any acute infarcts or acute occlusion. -Has chronic occasional sensory changes on the left side of the face and also arm, likely secondary to his cervical disc disease. -Carotid Dopplers with no hemodynamically significant stenosis. Echo is done and is pending. -Appreciate neurology consult. - may be migraines- outpatient follow up recommended - symptoms resolved now - discharge on aspirin  #2 HTN- low dose norvasc started -Patient's symptoms improved after his blood pressure was brought down with nitroglycerin tablet. But advised to check his blood pressures at home. Follow-up with PCP within one week after discharge  #3 hypogonadism-on testosterone implants.  Follow up with physician as prior schedule.  #4 depression-on Cymbalta  #5 GERD-on PPI   Being discharged today  DISCHARGE CONDITIONS:   Stable  CONSULTS OBTAINED:   Treatment Team:  Alexis Goodell, MD  DRUG ALLERGIES:   Allergies  Allergen Reactions  . Erythromycin Nausea And Vomiting  . Cephalexin     Other reaction(s): Other (See Comments) Other Reaction: Other reaction  . Dexamethasone     Other reaction(s): Other (See Comments) Other Reaction: Other reaction  . Oxycodone-Acetaminophen     Other reaction(s): Other (See Comments) Other Reaction: Other reaction  . Oxycodone-Acetaminophen     Other reaction(s): Other (See Comments) Other Reaction: Other reaction  . Prednisone     Other reaction(s): Other (See Comments) Other Reaction: Other reaction  . Gabapentin Other (See Comments)    Aggressive  . Ibuprofen Itching   DISCHARGE MEDICATIONS:   Allergies as of 02/20/2017      Reactions   Erythromycin Nausea And Vomiting   Cephalexin    Other reaction(s): Other (See Comments) Other Reaction: Other reaction   Dexamethasone    Other reaction(s): Other (See Comments) Other Reaction: Other reaction   Oxycodone-acetaminophen    Other reaction(s): Other (See Comments) Other Reaction: Other reaction   Oxycodone-acetaminophen    Other reaction(s): Other (See Comments) Other Reaction: Other reaction   Prednisone    Other reaction(s): Other (See Comments) Other Reaction: Other reaction   Gabapentin Other (See Comments)   Aggressive   Ibuprofen Itching      Medication List    STOP taking these  medications   mupirocin ointment 2 % Commonly known as:  BACTROBAN   naproxen 500 MG tablet Commonly known as:  NAPROSYN   sertraline 100 MG tablet Commonly known as:  ZOLOFT   sildenafil 20 MG tablet Commonly known as:  REVATIO     TAKE these medications   amLODipine 2.5 MG tablet Commonly known as:  NORVASC Take 1 tablet (2.5 mg total) by mouth  daily.   aspirin 81 MG EC tablet Take 1 tablet (81 mg total) by mouth daily. Start taking on:  02/21/2017   DULoxetine 30 MG capsule Commonly known as:  CYMBALTA Take 30 mg by mouth daily.   fluticasone 50 MCG/ACT nasal spray Commonly known as:  FLONASE Place 2 sprays into both nostrils daily.   multivitamin tablet Take 1 tablet by mouth daily.   omega-3 acid ethyl esters 1 g capsule Commonly known as:  LOVAZA Take 1 g by mouth daily.   omeprazole 20 MG capsule Commonly known as:  PRILOSEC TAKE 1 CAPSULE BY MOUTH DAILY.   Testosterone 75 MG Pllt TESTOPEL, 75MG  (Implant Pellet) - Historical Medication  (75 MG) Active   Testosterone Propionate Powd Apply topically. Reported on 05/16/2016        DISCHARGE INSTRUCTIONS:   1. PCP follow-up in 1-2 weeks 2. Neurology follow up in 2-3 weeks  DIET:   Cardiac diet  ACTIVITY:   Activity as tolerated  OXYGEN:   Home Oxygen: No.  Oxygen Delivery: room air  DISCHARGE LOCATION:   home   If you experience worsening of your admission symptoms, develop shortness of breath, life threatening emergency, suicidal or homicidal thoughts you must seek medical attention immediately by calling 911 or calling your MD immediately  if symptoms less severe.  You Must read complete instructions/literature along with all the possible adverse reactions/side effects for all the Medicines you take and that have been prescribed to you. Take any new Medicines after you have completely understood and accpet all the possible adverse reactions/side effects.   Please note  You were cared for by a hospitalist during your hospital stay. If you have any questions about your discharge medications or the care you received while you were in the hospital after you are discharged, you can call the unit and asked to speak with the hospitalist on call if the hospitalist that took care of you is not available. Once you are discharged, your primary care  physician will handle any further medical issues. Please note that NO REFILLS for any discharge medications will be authorized once you are discharged, as it is imperative that you return to your primary care physician (or establish a relationship with a primary care physician if you do not have one) for your aftercare needs so that they can reassess your need for medications and monitor your lab values.    On the day of Discharge:  VITAL SIGNS:   Blood pressure 139/87, pulse 67, temperature 97.9 F (36.6 C), temperature source Oral, resp. rate 16, height 6\' 2"  (1.88 m), weight 91.5 kg (201 lb 12.8 oz), SpO2 97 %.  PHYSICAL EXAMINATION:    GENERAL:  51 y.o.-year-old patient lying in the bed with no acute distress.  EYES: Pupils equal, round, reactive to light and accommodation. No scleral icterus. Extraocular muscles intact.  HEENT: Head atraumatic, normocephalic. Oropharynx and nasopharynx clear.  NECK:  Supple, no jugular venous distention. No thyroid enlargement, no tenderness.  LUNGS: Normal breath sounds bilaterally, no wheezing, rales,rhonchi or crepitation. No use of accessory  muscles of respiration.  CARDIOVASCULAR: S1, S2 normal. No murmurs, rubs, or gallops.  ABDOMEN: Soft, non-tender, non-distended. Bowel sounds present. No organomegaly or mass.  EXTREMITIES: No pedal edema, cyanosis, or clubbing.  NEUROLOGIC: Cranial nerves II through XII are intact. Muscle strength 5/5 in all extremities. Sensation intact. Subjective numbness on the left face. Gait not checked.  PSYCHIATRIC: The patient is alert and oriented x 3.  SKIN: No obvious rash, lesion, or ulcer.   DATA REVIEW:   CBC  Recent Labs Lab 02/20/17 0015  WBC 8.5  HGB 13.7  HCT 39.9*  PLT 185    Chemistries   Recent Labs Lab 02/19/17 1012 02/20/17 0015  NA 139 138  K 4.0 3.7  CL 109 106  CO2 28 25  GLUCOSE 86 95  BUN 18 20  CREATININE 1.13 1.19  CALCIUM 9.4 9.2  AST 43*  --   ALT 53  --   ALKPHOS 77   --   BILITOT 0.8  --      Microbiology Results  No results found for this or any previous visit.  RADIOLOGY:  US Carotid Bilateral (at [facility] Only)  Result Date: 02/19/2017 CLINICAL DATA:  TIA. EXAM: BILATERAL CAROTID DUPLEX ULTRASOUND TECHNIQUE: Pearline Cables scale imaging, color Doppler and duplex ultrasound were performed of bilateral carotid and vertebral arteries in the neck. COMPARISON:  No recent prior. FINDINGS: Criteria: Quantification of carotid stenosis is based on velocity parameters that correlate the residual internal carotid diameter with NASCET-based stenosis levels, using the diameter of the distal internal carotid lumen as the denominator for stenosis measurement. The following velocity measurements were obtained: RIGHT ICA:  68/23 cm/sec CCA:  [id number] cm/sec SYSTOLIC ICA/CCA RATIO:  1.0 DIASTOLIC ICA/CCA RATIO:  1.4 ECA:  79 cm/sec LEFT ICA:  65/ 27 cm/sec CCA:  [id number] cm/sec SYSTOLIC ICA/CCA RATIO:  0.7 DIASTOLIC ICA/CCA RATIO:  1.5 ECA:  92 cm/sec RIGHT CAROTID ARTERY: No significant carotid atherosclerotic vascular disease. Minimal plaque at the right carotid bifurcation. No flow limiting stenosis. Degree of stenosis less than 50%. RIGHT VERTEBRAL ARTERY:  Patent with antegrade flow. LEFT CAROTID ARTERY: Mild left carotid atherosclerotic vascular disease. No flow limiting stenosis. Minimal plaque left carotid bifurcation. Degree of stenosis less than 50%. LEFT VERTEBRAL ARTERY:  Patent antegrade flow. IMPRESSION: 1. No significant carotid atherosclerotic vascular disease free. Minimal atherosclerotic plaque both carotid bifurcations. Degree of stenosis less than 50% . 2.  Vertebral arteries are patent with antegrade flow . Electronically Signed   By: Marcello Moores  Register   On: 02/19/2017 15:38     Management plans discussed with the patient, family and they are in agreement.  CODE STATUS:     Code Status Orders        Start     Ordered   02/19/17 1359  Full code  Continuous      02/19/17 1358    Code Status History    Date Active Date Inactive Code Status Order ID Comments User Context   This patient has a current code status but no historical code status.      TOTAL TIME TAKING CARE OF THIS PATIENT: 37 minutes.    Gladstone Lighter M.D on 02/20/2017 at 2:32 PM  Between 7am to 6pm - Pager - [phone]  After 6pm go to [email or web address] - Proofreader  [facility]  Office  [phone]  CC: Primary care physician; Tommi Rumps, MD   Note: This dictation was prepared with Dragon dictation along with smaller phrase  technology. Any transcriptional errors that result from this process are unintentional.

## 2017-02-20 NOTE — Discharge Instructions (Signed)
1. Hold the norvasc if SBP <100

## 2017-02-20 NOTE — Progress Notes (Signed)
   02/20/17 1110  OT Time Calculation  OT Start Time (ACUTE ONLY) 0934  OT Stop Time (ACUTE ONLY) 0954  OT Time Calculation (min) 20 min  OT G-codes **NOT FOR INPATIENT CLASS**  Functional Assessment Tool Used AM-PAC 6 Clicks Daily Activity;Clinical judgement  Functional Limitation Self care  Self Care Current Status [id number]) CI  Self Care Goal Status [id number]) [place]  OT General Charges  $OT Visit 1 Procedure  OT Evaluation  $OT Eval Moderate Complexity 1 Procedure  Late Entry G-code Entry entered by Harrel Carina, MS, OTR/L. Initial evaluation, and assessment entered by Harrel Carina, MS, OTR/L

## 2017-02-21 LAB — HEMOGLOBIN A1C
Hgb A1c MFr Bld: 5.3 % (ref 4.8–5.6)
MEAN PLASMA GLUCOSE: 105 mg/dL

## 2017-02-23 NOTE — Telephone Encounter (Signed)
Transition Care Management Follow-up Telephone Call  How have you been since you were released from the hospital? Feeling better but numbness still comes and goes to left side of face. Patient stated you can tell when he smiles. BP 137/82    Do you understand why you were in the hospital?Yes , numbness and chest pain.   Do you understand the discharge instrcutions?Yes, to follow up with PCP.  Items Reviewed:  Medications reviewed:Yes  Allergies reviewed: yes  Dietary changes reviewed: Yes, low sodium and healthy heart diet.  Referrals reviewed:Yes   Functional Questionnaire:   Activities of Daily Living (ADLs):   He states they are independent in the following: Patient completely independent. States they require assistance with the following:No,   Any transportation issues/concerns?: No   Any patient concerns? Still having concern about elevated BP. 148/95 on 02/22/17.   Confirmed importance and date/time of follow-up visits scheduled: Yes,   Confirmed with patient if condition begins to worsen call PCP or go to the ER.  Patient was given the Call-a-Nurse line [phone]: {Yes,

## 2017-02-23 NOTE — Telephone Encounter (Signed)
Patient stated he has had transient Elevated BP s since being home and numbness to face that comes and goes. Patient could not give pulse the two BP's given one for today 137/82 and the other was 148/95 on 02/22/17, patient is at work today. BP's taken with Home cuff. Patient reported no numbness at time of call advised patient he should call office or return to ER if symptom's worsen before appointment on 02/26/17 with PCP.

## 2017-02-23 NOTE — Telephone Encounter (Signed)
Noted and agree. If has recurrent symptoms should be evaluated or in the very least call the office.

## 2017-02-24 ENCOUNTER — Other Ambulatory Visit: Payer: Self-pay | Admitting: Family Medicine

## 2017-02-26 ENCOUNTER — Encounter: Payer: Self-pay | Admitting: Family Medicine

## 2017-02-26 ENCOUNTER — Ambulatory Visit (INDEPENDENT_AMBULATORY_CARE_PROVIDER_SITE_OTHER): Payer: 59 | Admitting: Family Medicine

## 2017-02-26 VITALS — BP 120/80 | HR 83 | Temp 98.7°F | Wt 209.0 lb

## 2017-02-26 DIAGNOSIS — I10 Essential (primary) hypertension: Secondary | ICD-10-CM | POA: Diagnosis not present

## 2017-02-26 DIAGNOSIS — Z8673 Personal history of transient ischemic attack (TIA), and cerebral infarction without residual deficits: Secondary | ICD-10-CM | POA: Diagnosis not present

## 2017-02-26 DIAGNOSIS — G458 Other transient cerebral ischemic attacks and related syndromes: Secondary | ICD-10-CM

## 2017-02-26 DIAGNOSIS — R079 Chest pain, unspecified: Secondary | ICD-10-CM | POA: Diagnosis not present

## 2017-02-26 DIAGNOSIS — R0602 Shortness of breath: Secondary | ICD-10-CM

## 2017-02-26 NOTE — Assessment & Plan Note (Signed)
He has not had any exertional shortness of breath since discharge from the hospital. He notes no chest pain since receiving nitroglycerin. Given his prior symptoms and relief with nitroglycerin we'll refer to cardiology for further evaluation.

## 2017-02-26 NOTE — Progress Notes (Signed)
Tommi Rumps, MD Phone: [phone]  Daniel Calderon is a 51 y.o. male who presents today for hospital follow-up.  Patient was hospitalized for possible TIA from 02/19/17-02/20/17. He had left-sided facial numbness while he was in the office. He had an extensive workup that was unremarkable. Notes he had some numbness a couple days after the event though has not had any recurrence over the last several days. He notes no chest pain. It was potentially thought that it was related to his left cervical disc disease or migraines. Could've also been a TIA. Recommended neurology follow-up. The discharge summary was reviewed in full.  He was started on Norvasc for his blood pressure. Reports blood pressure has been in the 140s over 90s though they have been using electronic blood pressure cuff. No chest pain. Occasionally feels like he needs to take deep breaths. No dyspnea going upstairs. Had a reassuring echo. Has not had any chest discomfort since he was given the nitroglycerin.  PMH: nonsmoker.   ROS see history of present illness  Objective  Physical Exam Vitals:   02/26/17 1136  BP: 120/80  Pulse: 83  Temp: 98.7 F (37.1 C)    BP Readings from Last 3 Encounters:  02/26/17 120/80  02/20/17 139/87  02/19/17 128/80   Wt Readings from Last 3 Encounters:  02/26/17 209 lb (94.8 kg)  02/19/17 201 lb 12.8 oz (91.5 kg)  02/19/17 208 lb 9.6 oz (94.6 kg)    Physical Exam  Constitutional: No distress.  HENT:  Head: Normocephalic and atraumatic.  Mouth/Throat: Oropharynx is clear and moist.  Eyes: Conjunctivae are normal. Pupils are equal, round, and reactive to light.  Cardiovascular: Normal rate, regular rhythm and normal heart sounds.   Pulmonary/Chest: Effort normal and breath sounds normal.  Musculoskeletal: He exhibits no edema.  Neurological: He is alert.  CN 2-12 intact, 5/5 strength in bilateral biceps, triceps, grip, quads, hamstrings, plantar and dorsiflexion, sensation  to light touch intact in bilateral UE and LE, normal gait  Skin: Skin is warm. He is not diaphoretic.     Assessment/Plan: Please see individual problem list.  TIA (transient ischemic attack) Patient with possible TIA versus cervical disc nerve compression versus migraine. He has done relatively well over the last several days with no symptoms. We will refer to neurology for further evaluation to determine if anything else needs to be done regarding evaluation of this issue.  Exertional shortness of breath He has not had any exertional shortness of breath since discharge from the hospital. He notes no chest pain since receiving nitroglycerin. Given his prior symptoms and relief with nitroglycerin we'll refer to cardiology for further evaluation.  Hypertension Has responded well to Norvasc. Suspect his home blood pressure cuff is not accurate. He will bring his blood pressure cuff in to work tomorrow and have one of the nurses check his blood pressure and compare it to his home cuff. He will contact us and let us know the results. He'll continue Norvasc.   Orders Placed This Encounter  Procedures  . Ambulatory referral to Neurology    Referral Priority:   Routine    Referral Type:   Consultation    Referral Reason:   Specialty Services Required    Requested Specialty:   Neurology    Number of Visits Requested:   1  . Ambulatory referral to Cardiology    Referral Priority:   Routine    Referral Type:   Consultation    Referral Reason:   Specialty  Services Required    Requested Specialty:   Cardiology    Number of Visits Requested:   1   Patient was given return precautions.  Tommi Rumps, MD [place]

## 2017-02-26 NOTE — Assessment & Plan Note (Signed)
Has responded well to Norvasc. Suspect his home blood pressure cuff is not accurate. He will bring his blood pressure cuff in to work tomorrow and have one of the nurses check his blood pressure and compare it to his home cuff. He will contact us and let us know the results. He'll continue Norvasc.

## 2017-02-26 NOTE — Assessment & Plan Note (Signed)
Patient with possible TIA versus cervical disc nerve compression versus migraine. He has done relatively well over the last several days with no symptoms. We will refer to neurology for further evaluation to determine if anything else needs to be done regarding evaluation of this issue.

## 2017-02-26 NOTE — Progress Notes (Signed)
Pre visit review using our clinic review tool, if applicable. No additional management support is needed unless otherwise documented below in the visit note. 

## 2017-02-26 NOTE — Patient Instructions (Signed)
Nice to see you.  I am glad you are doing better. We'll get you to see neurology and cardiology. Please continue the Norvasc for your blood pressure. If you develop numbness, weakness, chest pain, shortness of breath, or any new or changing in symptoms please seek medical attention immediately.

## 2017-02-27 ENCOUNTER — Encounter: Payer: Self-pay | Admitting: Neurology

## 2017-03-09 DIAGNOSIS — M461 Sacroiliitis, not elsewhere classified: Secondary | ICD-10-CM | POA: Diagnosis not present

## 2017-03-09 DIAGNOSIS — M791 Myalgia: Secondary | ICD-10-CM | POA: Diagnosis not present

## 2017-03-09 DIAGNOSIS — M5127 Other intervertebral disc displacement, lumbosacral region: Secondary | ICD-10-CM | POA: Diagnosis not present

## 2017-03-09 DIAGNOSIS — M5137 Other intervertebral disc degeneration, lumbosacral region: Secondary | ICD-10-CM | POA: Diagnosis not present

## 2017-03-09 DIAGNOSIS — M542 Cervicalgia: Secondary | ICD-10-CM | POA: Diagnosis not present

## 2017-03-19 ENCOUNTER — Encounter: Payer: Self-pay | Admitting: Internal Medicine

## 2017-03-19 ENCOUNTER — Ambulatory Visit (INDEPENDENT_AMBULATORY_CARE_PROVIDER_SITE_OTHER): Payer: 59 | Admitting: Internal Medicine

## 2017-03-19 VITALS — BP 120/90 | HR 79 | Ht 74.0 in | Wt 210.8 lb

## 2017-03-19 DIAGNOSIS — R0609 Other forms of dyspnea: Secondary | ICD-10-CM

## 2017-03-19 DIAGNOSIS — R079 Chest pain, unspecified: Secondary | ICD-10-CM

## 2017-03-19 NOTE — Patient Instructions (Signed)
Medication Instructions:  Your physician recommends that you continue on your current medications as directed. Please refer to the Current Medication list given to you today.   Labwork: none  Testing/Procedures: Your physician has requested that you have an exercise tolerance test. For further information please visit [email or web address]. Please also follow instruction sheet, as given.    Follow-Up: Your physician recommends that you schedule a follow-up appointment ON AN AS NEEDED BASIS. - We will contact you with the results of your stress test.     Exercise Stress Electrocardiogram An exercise stress electrocardiogram is a test to check how blood flows to your heart. It is done to find areas of poor blood flow. You will need to walk on a treadmill for this test. The electrocardiogram will record your heartbeat when you are at rest and when you are exercising. What happens before the procedure?  Do not have drinks with caffeine or foods with caffeine for 24 hours before the test, or as told by your doctor. This includes coffee, tea (even decaf tea), sodas, chocolate, and cocoa.  Follow your doctor's instructions about eating and drinking before the test.  Ask your doctor what medicines you should or should not take before the test. Take your medicines with water unless told by your doctor not to.  If you use an inhaler, bring it with you to the test.  Bring a snack to eat after the test.  Do not  smoke for 4 hours before the test.  Do not put lotions, powders, creams, or oils on your chest before the test.  Wear comfortable shoes and clothing. What happens during the procedure?  You will have patches put on your chest. Small areas of your chest may need to be shaved. Wires will be connected to the patches.  Your heart rate will be watched while you are resting and while you are exercising.  You will walk on the treadmill. The treadmill will slowly get faster to raise  your heart rate.  The test will take about 1-2 hours. What happens after the procedure?  Your heart rate and blood pressure will be watched after the test.  You may return to your normal diet, activities, and medicines or as told by your doctor. This information is not intended to replace advice given to you by your health care provider. Make sure you discuss any questions you have with your health care provider. Document Released: 05/26/2008 Document Revised: 08/06/2016 Document Reviewed: 08/15/2013 Elsevier Interactive Patient Education  2017 [employer].

## 2017-03-19 NOTE — Progress Notes (Signed)
New Outpatient Visit Date: 03/19/2017  Referring Provider: [name], MD [address] [place], [place] [address]  Chief Complaint: Dyspnea on exertion  HPI:  Daniel Calderon is seen today for evaluation of dyspnea on exertion at the request of Dr. Caryl Bis. He is a 51 y.o. year-old male with history of thoracic outlet syndrome status post remote surgical intervention, hypertension, sleep apnea, kidney stones, anxiety/depression, and migraine headaches. Patient's history is challenging to ascertain, as he frequently has tangential thoughts when trying to answer questions. Daniel Calderon was evaluated by Dr. Caryl Bis at the beginning of this month and complained of left-sided facial numbness. He was referred to the emergency department for evaluation of possible stroke or TIA. Extensive workup was unrevealing. The patient reports a history of chest pain and shortness of breath for many years. He is unsure if either symptom has worsened significantly in the recent past. He describes his chest pain as tightness under the sternum and radiating to the left breast. It is constant, though it also fluctuates in intensity without clear precipitant. Interestingly, he later stated during the interview that his chest pain has been totally absent since receiving a single dose of sublingual nitroglycerin during his hospitalization earlier this month. Shortness of breath is also there all the time, though intermittently it seems to be worsened by activity. He denies leg edema or orthopnea. He is compliant with CPAP. He notes occasional palpitations that are associated with "sort of a hallucination" feeling. He has not passed out.  Previous cardiovascular evaluation included stress test around 2011; patient believes it was done at [facility]. His knowledge, this was normal. Echocardiogram earlier this month was also unrevealing. Patient is currently in the process of self-weaning amlodipine because he  believes it is not working and makes him "more agitated." He has decreased it down to 1.25 mg daily and is anticipating lowering it further before stopping altogether. He consumes 2-3 16 ounce cups of coffee per day. He also drinks 2-3 sodas per week.  --------------------------------------------------------------------------------------------------  Cardiovascular History & Procedures: Cardiovascular Problems:  Atypical chest pain  Chronic shortness of breath  Questionable TIA  Risk Factors:  Hypertension, male gender, and questionable TIA  Cath/PCI:  None  CV Surgery:  Thoracic outlet syndrome surgery  EP Procedures and Devices:  None  Non-Invasive Evaluation(s):  TTE (02/20/17): Normal LV size with mild LVH. LVEF 55-60% with normal wall motion. Normal diastolic function. No significant valvular abnormalities. Normal RV size and function.  Carotid Doppler (02/19/17): Minimal atherosclerotic plaque at both carotid bifurcations (less than 50% stenosis). Antegrade vertebral artery flow bilaterally.  Recent CV Pertinent Labs: Lab Results  Component Value Date   CHOL 184 02/20/2017   CHOL 195 12/03/2015   HDL 36 (L) 02/20/2017   HDL 38 (L) 12/03/2015   LDLCALC 108 (H) 02/20/2017   LDLCALC 104 (H) 12/03/2015   TRIG 201 (H) 02/20/2017   CHOLHDL 5.1 02/20/2017   INR 1.02 02/19/2017   BNP 22.0 02/19/2017   K 3.7 02/20/2017   BUN 20 02/20/2017   BUN 17 04/02/2015   CREATININE 1.19 02/20/2017    --------------------------------------------------------------------------------------------------  Past Medical History:  Diagnosis Date  . Allergy    Seasonal  . Anxiety   . Benign enlargement of prostate   . Depression   . Elevated BP   . Elevated transaminase level   . Failure of erection   . Fatigue   . Frequent headaches   . Hypogonadism in male   . Kidney stones  History of kidney stones  . Migraine   . Sleep apnea    Currently uses cpap    Past Surgical  History:  Procedure Laterality Date  . Left Shoulder Surgery  2011  . nerve block     2 in neck. 5 in back.  . NOSE SURGERY    . SCALENE NODE BIOPSY / EXCISION    . VASECTOMY      Outpatient Encounter Prescriptions as of 03/19/2017  Medication Sig  . amLODipine (NORVASC) 2.5 MG tablet Take 1 tablet (2.5 mg total) by mouth daily.  Marland Kitchen aspirin EC 81 MG EC tablet Take 1 tablet (81 mg total) by mouth daily.  . fluticasone (FLONASE) 50 MCG/ACT nasal spray Place 2 sprays into both nostrils daily.  . Multiple Vitamin (MULTIVITAMIN) tablet Take 1 tablet by mouth daily.  Marland Kitchen omega-3 acid ethyl esters (LOVAZA) 1 g capsule Take 1 g by mouth daily.  Marland Kitchen omeprazole (PRILOSEC) 20 MG capsule TAKE 1 CAPSULE BY MOUTH DAILY.  . [DISCONTINUED] Testosterone 75 MG PLLT TESTOPEL, 75MG  (Implant Pellet) - Historical Medication  (75 MG) Active   No facility-administered encounter medications on file as of 03/19/2017.     Allergies: Erythromycin; Cephalexin; Dexamethasone; Oxycodone-acetaminophen; Oxycodone-acetaminophen; Prednisone; Gabapentin; and Ibuprofen  Social History   Social History  . Marital status: Married    Spouse name: N/A  . Number of children: N/A  . Years of education: N/A   Occupational History  . Not on file.   Social History Main Topics  . Smoking status: Never Smoker  . Smokeless tobacco: Never Used  . Alcohol use 1.2 oz/week    2 Cans of beer per week  . Drug use: No  . Sexual activity: Yes    Partners: Female     Comment: Wife   Other Topics Concern  . Not on file   Social History Narrative   [employer]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    Lives with wife and daughter (22)   High school and tech school   Caffeine- 2-3 coffee    Enjoys- yard work, Location manager- 2 dogs        Family History  Problem Relation Age of Onset  . Hypertension Mother     Living  . Hearing loss Mother   . Heart disease Other   . Healthy Father     Living  . Diabetes Brother   . Stroke Paternal Uncle     . Heart attack Paternal Uncle   . Hearing loss Maternal Grandmother   . Healthy Son   . Healthy Daughter     Review of Systems: Facial paresthesias have resolved. Occasional leg fatigue/weakness after climbing stairs. Otherwise, a 12-system review of systems was performed and was negative except as noted in the HPI.  --------------------------------------------------------------------------------------------------  Physical Exam: BP 120/90 (BP Location: Right Arm, Patient Position: Sitting, Cuff Size: Normal)   Pulse 79   Ht 6\' 2"  (1.88 m)   Wt 210 lb 12 oz (95.6 kg)   BMI 27.06 kg/m   General:  Overweight man, seated comfortably in the exam room. HEENT: No conjunctival pallor or scleral icterus.  Moist mucous membranes.  OP clear. Neck: Supple without lymphadenopathy, thyromegaly, JVD, or HJR.  No carotid bruit. Lungs: Normal work of breathing.  Clear to auscultation bilaterally without wheezes or crackles. Heart: Regular rate and rhythm without murmurs, rubs, or gallops.  Non-displaced PMI. Abd: Bowel sounds present.  Soft, NT/ND without hepatosplenomegaly Ext: No lower extremity edema.  Radial, PT,  and DP pulses are 2+ bilaterally Skin: warm and dry without rash Neuro: CNIII-XII intact.  Strength and fine-touch sensation intact in upper and lower extremities bilaterally. Psych: Normal mood. Speech is somewhat pressured with tangential thoughts.  EKG:  Normal sinus rhythm (heart rate 79 bpm) without abnormalities.  Lab Results  Component Value Date   WBC 8.5 02/20/2017   HGB 13.7 02/20/2017   HCT 39.9 (L) 02/20/2017   MCV 81.1 02/20/2017   PLT 185 02/20/2017    Lab Results  Component Value Date   NA 138 02/20/2017   K 3.7 02/20/2017   CL 106 02/20/2017   CO2 25 02/20/2017   BUN 20 02/20/2017   CREATININE 1.19 02/20/2017   GLUCOSE 95 02/20/2017   ALT 53 02/19/2017    Lab Results  Component Value Date   CHOL 184 02/20/2017   HDL 36 (L) 02/20/2017   LDLCALC  108 (H) 02/20/2017   TRIG 201 (H) 02/20/2017   CHOLHDL 5.1 02/20/2017   Lab Results  Component Value Date   TSH 1.987 02/20/2017    --------------------------------------------------------------------------------------------------  ASSESSMENT AND PLAN: Chronic chest pain and shortness of breath Patient has chronic chest pain and shortness of breath that has been present for the better part of a decade. It is difficult to ascertain if this has worsened recently. His physical exam and EKG are unremarkable. Recent echocardiogram was also normal. I recommend exercise tolerance test to exclude ischemia and to better evaluate the patient's functional capacity. If ETT is negative, no further cardiac workup is warranted at this time.  Follow-up: To be determined based on results of exercise tolerance test. If study is negative, return to clinic as needed.  Nelva Bush, MD 03/21/2017 3:54 PM

## 2017-03-20 ENCOUNTER — Other Ambulatory Visit: Payer: Self-pay

## 2017-03-20 DIAGNOSIS — M461 Sacroiliitis, not elsewhere classified: Secondary | ICD-10-CM | POA: Diagnosis not present

## 2017-03-20 DIAGNOSIS — M791 Myalgia: Secondary | ICD-10-CM | POA: Diagnosis not present

## 2017-03-20 DIAGNOSIS — M5137 Other intervertebral disc degeneration, lumbosacral region: Secondary | ICD-10-CM | POA: Diagnosis not present

## 2017-03-20 DIAGNOSIS — M5127 Other intervertebral disc displacement, lumbosacral region: Secondary | ICD-10-CM | POA: Diagnosis not present

## 2017-03-20 NOTE — Telephone Encounter (Signed)
Pt would like for [name] to refill his Zoloft just until he has his treadmill on Tuesday. He states that his PCP is out of the office today, and pt is completely out. Please call.

## 2017-03-20 NOTE — Telephone Encounter (Signed)
I do not prescribe this medication. He will need to contact whomever is covering for his PCP today.

## 2017-03-20 NOTE — Telephone Encounter (Signed)
Spoke w/ pt.  Advised him of Dr. Darnelle Bos recommendation.  He is verbally upset but understands.

## 2017-03-21 DIAGNOSIS — R079 Chest pain, unspecified: Secondary | ICD-10-CM | POA: Insufficient documentation

## 2017-03-23 ENCOUNTER — Other Ambulatory Visit: Payer: Self-pay | Admitting: Family Medicine

## 2017-03-23 ENCOUNTER — Telehealth: Payer: Self-pay | Admitting: *Deleted

## 2017-03-23 MED ORDER — SERTRALINE HCL 100 MG PO TABS: 100.0000 mg | ORAL_TABLET | Freq: Every day | ORAL | 3 refills | Status: DC

## 2017-03-23 NOTE — Telephone Encounter (Signed)
Left message to return call 

## 2017-03-23 NOTE — Telephone Encounter (Signed)
Patient notified

## 2017-03-23 NOTE — Telephone Encounter (Signed)
Please call patient at [phone]

## 2017-03-23 NOTE — Telephone Encounter (Signed)
Requested medication refill for : Sertraline  Pharmacy: [facility]   Please Contact Pt when ready or sent to Pharmacy:  [phone]

## 2017-03-23 NOTE — Telephone Encounter (Signed)
It appears this was discontinued when the patient was in the hospital and it was reported that he is on cymbalta. Please confirm if he is taking cymbalta. It sounds as though he has continued to take the sertraline and I am fine refilling it if it is appropriate. Thanks.

## 2017-03-23 NOTE — Telephone Encounter (Signed)
I can refill the sertraline as the patient states he has been taking this. Sertraline 100 mg sent to pharmacy.

## 2017-03-23 NOTE — Telephone Encounter (Signed)
Please advise 

## 2017-03-23 NOTE — Telephone Encounter (Signed)
Reported in chart as not taking, please advise

## 2017-03-23 NOTE — Telephone Encounter (Signed)
Already refilled

## 2017-03-23 NOTE — Telephone Encounter (Signed)
Patients wife states he has not been on cymbalta in a long time, she also states patient has not been on sertraline since they discontinued it in the hospital, per patient he did not discontinue sertraline. Please advise

## 2017-03-24 ENCOUNTER — Ambulatory Visit: Payer: 59

## 2017-03-24 ENCOUNTER — Telehealth: Payer: Self-pay | Admitting: *Deleted

## 2017-03-24 NOTE — Telephone Encounter (Signed)
Patient here today for treadmill stress test. Patient c/o indigestion and chest discomfort "7" out of 10. States he has jaw pain and feels SOB. The discomfort started on his way to the office for test. He thinks he swallowed one of his morning pills wrong and its caused the discomfort. 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 Patient's EKG shows NSR, heart rate 55. BP 187/116. Patient stated he only took amlodipine for a few days after he got the prescription on 02/20/17 and has not taken it since because "it wasn't working."  Discussed symptoms with Christell Faith, PA. He advised the best thing is for the patient to go to the ER for evaluation of symptoms.  Let patient know the advice and that I will take him via wheelchair to ED. He refused to go and said he will wait to take the [employer]. He verbalized understanding that if symptoms do not resolve then to proceed to the ER.

## 2017-03-24 NOTE — Telephone Encounter (Signed)
Please have Daniel Calderon increase his amlodipine to 5 mg daily and reschedule him for ETT early next week. If his chest pain worsens in the meantime, he should go to the ED for further evaluation. Thanks.

## 2017-03-24 NOTE — Telephone Encounter (Signed)
Patient returned about 10 minutes later to the office. He was accompanied by his wife. He stated he felt much better after taking Alka Seltzer and no longer had any of the symptoms. Discussed that we still could not do Stress test today because his BP was too high.  Advised I will route to Dr End for further advice on treatment and plan to call him to reschedule stress test for a later time.

## 2017-03-25 MED ORDER — AMLODIPINE BESYLATE 5 MG PO TABS: 5.0000 mg | ORAL_TABLET | Freq: Every day | ORAL | 3 refills | Status: DC

## 2017-03-25 NOTE — Telephone Encounter (Signed)
S/w patient. He verbalized understanding to take Amlodipine 5 mg by mouth once a day, r/s ETT, and advice to go to ER for worsening chest pain and symptoms. He said he is on his way down to the office at this time and will r/s the ETT. The reason why he was coming down this way is the side of his face feels weak and numb.  I advised he should go to the ER for evaluation of symptoms as he was recently there for a TIA. He verbalized understanding.

## 2017-03-30 ENCOUNTER — Other Ambulatory Visit: Payer: Self-pay

## 2017-03-30 ENCOUNTER — Ambulatory Visit (INDEPENDENT_AMBULATORY_CARE_PROVIDER_SITE_OTHER): Payer: 59

## 2017-03-30 DIAGNOSIS — R079 Chest pain, unspecified: Secondary | ICD-10-CM

## 2017-03-31 LAB — EXERCISE TOLERANCE TEST
CHL CUP MPHR: 172 {beats}/min
CSEPEW: 12.9 METS
CSEPPHR: 151 {beats}/min
Exercise duration (min): 10 min
Exercise duration (sec): 46 s
Percent HR: 87 %
Rest HR: 73 {beats}/min

## 2017-04-30 ENCOUNTER — Telehealth: Payer: Self-pay | Admitting: Family Medicine

## 2017-04-30 NOTE — Telephone Encounter (Signed)
Could we use your 1130 hospital follow up for this slot, the rest of all the schedules are full? thanks

## 2017-04-30 NOTE — Telephone Encounter (Signed)
Recommend Urgent care in his absence (unless you can fit him on my or Arnett's schedule)

## 2017-04-30 NOTE — Telephone Encounter (Signed)
Pt called c/o sore throat, ear ache (right), and upset stomach. No available appts today or tomorrow. Please advise, thank you!  Call tp @ [phone]

## 2017-04-30 NOTE — Telephone Encounter (Signed)
Daniel Calderon the 2620 BTDH tomorrow on his schedule for this, can you please call and schedule, thanks otherwise urgent care.

## 2017-05-01 ENCOUNTER — Ambulatory Visit: Payer: Self-pay | Admitting: Physician Assistant

## 2017-05-01 ENCOUNTER — Ambulatory Visit (INDEPENDENT_AMBULATORY_CARE_PROVIDER_SITE_OTHER): Payer: 59 | Admitting: Neurology

## 2017-05-01 ENCOUNTER — Encounter: Payer: Self-pay | Admitting: Physician Assistant

## 2017-05-01 ENCOUNTER — Encounter: Payer: Self-pay | Admitting: Neurology

## 2017-05-01 VITALS — BP 130/80 | HR 82 | Temp 98.3°F | Ht 74.0 in | Wt 204.0 lb

## 2017-05-01 VITALS — BP 130/82 | HR 78 | Temp 98.4°F

## 2017-05-01 DIAGNOSIS — R413 Other amnesia: Secondary | ICD-10-CM

## 2017-05-01 DIAGNOSIS — J209 Acute bronchitis, unspecified: Secondary | ICD-10-CM

## 2017-05-01 DIAGNOSIS — F809 Developmental disorder of speech and language, unspecified: Secondary | ICD-10-CM | POA: Diagnosis not present

## 2017-05-01 DIAGNOSIS — M5412 Radiculopathy, cervical region: Secondary | ICD-10-CM

## 2017-05-01 DIAGNOSIS — G540 Brachial plexus disorders: Secondary | ICD-10-CM | POA: Diagnosis not present

## 2017-05-01 MED ORDER — SULFAMETHOXAZOLE-TRIMETHOPRIM 800-160 MG PO TABS: 1.0000 | ORAL_TABLET | Freq: Two times a day (BID) | ORAL | 0 refills | Status: DC

## 2017-05-01 MED ORDER — BENZONATATE 200 MG PO CAPS: 200.0000 mg | ORAL_CAPSULE | Freq: Two times a day (BID) | ORAL | 0 refills | Status: DC | PRN

## 2017-05-01 MED ORDER — FEXOFENADINE-PSEUDOEPHED ER 60-120 MG PO TB12: 1.0000 | ORAL_TABLET | Freq: Two times a day (BID) | ORAL | 0 refills | Status: DC

## 2017-05-01 NOTE — Patient Instructions (Addendum)
I don't think you had a mini stroke.  You may have some mild persistent symptoms of a concussion from last year.  Therefore, we will schedule you for neurocognitive testing here.  Follow up afterwards.

## 2017-05-01 NOTE — Progress Notes (Signed)
   Subjective: URI    Patient ID: Daniel Calderon, male    DOB: 07-08-1966, 51 y.o.   MRN: [id number]  HPI Patient c/o sinus congestion, sore throat, bodyache, greenish productive cough for 2 days. Denies fever/chill, or N/V/D. No palliative measure for compliant.   Review of Systems HTN   Objective:   Physical Exam Bilateral maxillary guarding, edematous nasal turbinates, and post nasal drainage. Neck supple w/o adenopathy. Lungs with bilateral Rales. Heart RRR       Assessment & Plan:sinus congestion and Bronchitis  Bactrim DS, Allergra-D, and Tessalon pearls. Follow up with PCP if no improvement in 3-5 days.

## 2017-05-01 NOTE — Progress Notes (Signed)
NEUROLOGY CONSULTATION NOTE  Daniel Calderon MRN: [id number] DOB: 25-Jun-1966  Referring provider: Dr. Caryl Bis Primary care provider: Dr. Caryl Bis  Reason for consult:  TIA  HISTORY OF PRESENT ILLNESS: Daniel Calderon is a 51 year old right-handed male with hypogonadism, sleep apnea, migraines and cervical radiculopathy due to history of cervical spine injury with multiple surgeries who presents for TIA.  History supplemented by hospital records.  Patient reports history of left sided thoracic outlet syndrome, for which he had surgery back in 2011.  He also had left sided neck and shoulder pain.  At that time, he underwent a cervical nerve root block, which caused residual numbness in the back of his head on the left.  Occasionally, he reports transient left sided numbness of the face, neck and upper extremity.  It usually lasts seconds to minutes.  He has some chronic posterior head and neck pain.  There are no radicular symptoms or weakness of the left upper extremity.  In March 2017, a beam fell on the top of his head.  He did not lose consciousness but reports speech difficulty since then.  He knows what he wants to say but has trouble getting words out.  It has been brought to his attention that he seems to have increased trouble with word production when he talks to people in his left field of vision.  However, he talks more fluently when people move to his right side of vision.  He also reports difficulty concentrating and organizing.  He endorses some short-term memory deficits.  However, none of these symptoms impede his ability to function.  He was admitted to [facility] from 02/19/17 to 02/20/17 for evaluation of left upper extremity and facial numbness.  He was in his PCP's office for a check up when he suddenly endorsed shortness of breath, chest discomfort and the left sided facial and upper extremity numbness, lasting about 3 minutes.  He underwent a stroke  workup.  MRI and MRA of head were personally reviewed and did not demonstrate acute infarction or significant arterial stenosis or occlusion.  Carotid doppler revealed no hemodynamically significant stenosis.  2D echo demonstrated LV EF 55-60% with no cardiac source of emboli.  LDL was 108.  Hgb A1c was 5.3.  During that admission, he exhibited a recurrent episode of left sided numbness, but also including the leg.  He had associated palpitations, chest pain and diaphoresis.  He was given NTG and symptoms resolved after 10 minutes.  Cardiac workup was unremarkable.  He was treated for possible TIA (ASA and statin therapy), however it was entertained that the symptoms were secondary to his chronic cervical disc disease or sequelae of migraine.  He had an outpatient stress test on 03/31/17, which was normal.  PAST MEDICAL HISTORY: Past Medical History:  Diagnosis Date  . Allergy    Seasonal  . Anxiety   . Benign enlargement of prostate   . Depression   . Elevated BP   . Elevated transaminase level   . Failure of erection   . Fatigue   . Frequent headaches   . Hypogonadism in male   . Kidney stones    History of kidney stones  . Migraine   . Sleep apnea    Currently uses cpap    PAST SURGICAL HISTORY: Past Surgical History:  Procedure Laterality Date  . Left Shoulder Surgery  2011  . nerve block     2 in neck. 5 in back.  Marland Kitchen  NOSE SURGERY    . SCALENE NODE BIOPSY / EXCISION    . VASECTOMY      MEDICATIONS: Current Outpatient Prescriptions on File Prior to Visit  Medication Sig Dispense Refill  . amLODipine (NORVASC) 5 MG tablet Take 1 tablet (5 mg total) by mouth daily. 90 tablet 3  . aspirin EC 81 MG EC tablet Take 1 tablet (81 mg total) by mouth daily. 30 tablet 2  . fluticasone (FLONASE) 50 MCG/ACT nasal spray Place 2 sprays into both nostrils daily. 16 g 2  . Multiple Vitamin (MULTIVITAMIN) tablet Take 1 tablet by mouth daily.    Marland Kitchen omega-3 acid ethyl esters (LOVAZA) 1 g capsule  Take 1 g by mouth daily.    Marland Kitchen omeprazole (PRILOSEC) 20 MG capsule TAKE 1 CAPSULE BY MOUTH DAILY. 90 capsule 3  . sertraline (ZOLOFT) 100 MG tablet Take 1 tablet (100 mg total) by mouth daily. 30 tablet 3   No current facility-administered medications on file prior to visit.     ALLERGIES: Allergies  Allergen Reactions  . Erythromycin Nausea And Vomiting  . Cephalexin     Other reaction(s): Other (See Comments) Other Reaction: Other reaction  . Dexamethasone     Other reaction(s): Other (See Comments) Other Reaction: Other reaction  . Oxycodone-Acetaminophen     Other reaction(s): Other (See Comments) Other Reaction: Other reaction  . Oxycodone-Acetaminophen     Other reaction(s): Other (See Comments) Other Reaction: Other reaction  . Prednisone     Other reaction(s): Other (See Comments) Other Reaction: Other reaction  . Gabapentin Other (See Comments)    Aggressive  . Ibuprofen Itching    FAMILY HISTORY: Family History  Problem Relation Age of Onset  . Hypertension Mother        Living  . Hearing loss Mother   . Heart disease Other   . Healthy Father        Living  . Diabetes Brother   . Stroke Paternal Uncle   . Heart attack Paternal Uncle   . Hearing loss Maternal Grandmother   . Healthy Son   . Healthy Daughter     SOCIAL HISTORY: Social History   Social History  . Marital status: Married    Spouse name: N/A  . Number of children: N/A  . Years of education: N/A   Occupational History  . Not on file.   Social History Main Topics  . Smoking status: Never Smoker  . Smokeless tobacco: Never Used  . Alcohol use 1.2 oz/week    2 Cans of beer per week  . Drug use: No  . Sexual activity: Yes    Partners: Female     Comment: Wife   Other Topics Concern  . Not on file   Social History Narrative   [employer]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    Lives with wife and daughter (49)   High school and tech school   Caffeine- 2-3 coffee    Enjoys- yard work, Technical sales engineer- 2 dogs        REVIEW OF SYSTEMS: Constitutional: No fevers, chills, or sweats, no generalized fatigue, change in appetite Eyes: No visual changes, double vision, eye pain Ear, nose and throat: No hearing loss, ear pain, nasal congestion, sore throat Cardiovascular: No chest pain, palpitations Respiratory:  No shortness of breath at rest or with exertion, wheezes GastrointestinaI: No nausea, vomiting, diarrhea, abdominal pain, fecal incontinence Genitourinary:  No dysuria, urinary retention or frequency Musculoskeletal:  Neck pain Integumentary: No rash,  pruritus, skin lesions Neurological: as above Psychiatric: No depression, insomnia, anxiety Endocrine: No palpitations, fatigue, diaphoresis, mood swings, change in appetite, change in weight, increased thirst Hematologic/Lymphatic:  No purpura, petechiae. Allergic/Immunologic: no itchy/runny eyes, nasal congestion, recent allergic reactions, rashes  PHYSICAL EXAM: Vitals:   05/01/17 0748  BP: 130/80  Pulse: 82  Temp: 98.3 F (36.8 C)   General: No acute distress.  Patient appears well-groomed.  Head:  Normocephalic/atraumatic Eyes:  fundi examined but not visualized Neck: supple, no paraspinal tenderness, full range of motion Back: No paraspinal tenderness Heart: regular rate and rhythm Lungs: Clear to auscultation bilaterally. Vascular: No carotid bruits. Neurological Exam: Mental status: alert and oriented to person, place, and time, delayed recall 2/3 words and remote memory intact, fund of knowledge intact, attention and concentration somewhat impaired.  Although he may answer correctly, he has difficulty with recall.  Speech is overall fluent with some intermittent paucity of speech.  When I walk to his left side, it appears to be slightly worse than when I he talks to me when I am to his right side. MMSE - Mini Mental State Exam 05/01/2017  Orientation to time 5  Orientation to Place 5  Registration 3  Attention/  Calculation 4  Recall 2  Language- name 2 objects 2  Language- repeat 1  Language- follow 3 step command 3  Language- read & follow direction 1  Write a sentence 1  Copy design 1  Total score 28   Cranial nerves: CN I: not tested CN II: pupils equal, round and reactive to light, visual fields intact CN III, IV, VI:  full range of motion, no nystagmus, no ptosis CN V: facial sensation intact CN VII: upper and lower face symmetric CN VIII: hearing intact CN IX, X: gag intact, uvula midline CN XI: sternocleidomastoid and trapezius muscles intact CN XII: tongue midline Bulk & Tone: normal, no fasciculations. Motor:  5/5 throughout  Sensation:  Pinprick and vibration sensation intact. Deep Tendon Reflexes:  2+ throughout, toes downgoing.  Finger to nose testing:  Without dysmetria.  Heel to shin:  Without dysmetria.  Gait:  Normal station and stride.  Able to turn and tandem walk. Romberg negative.  IMPRESSION: 1.  Transient left sided numbness.  I do not believe he had a cerebrovascular event.  These are recurrent events since his thoracic outlet surgery in 2011.  While in the hospital, he had a recurrent episode where the numbness included the left leg.  This was associated with palpitations, diaphoresis and chest pain.  This possibly may be anxiety-related. 2.  Short-term memory deficits and abnormal speech.  A mild persistent postconcussion syndrome is possible.  However, I have no explanation why his speech is slightly more dysfluent when he addresses somebody to his left.  It is not indicative of apraxia or neglect.  I suspect this may be non-organic.  PLAN: 1.  We will get formal neuropsychological testing.  He will follow up with me afterwards. 2.  As I said, I do not feel he had a TIA.  Therefore, you may consider discontinuing ASA and statin  Thank you for allowing me to take part in the care of this patient.  Metta Clines, DO  CC: Tommi Rumps, MD

## 2017-06-01 ENCOUNTER — Ambulatory Visit (INDEPENDENT_AMBULATORY_CARE_PROVIDER_SITE_OTHER): Payer: 59 | Admitting: Family Medicine

## 2017-06-01 ENCOUNTER — Encounter: Payer: Self-pay | Admitting: Family Medicine

## 2017-06-01 DIAGNOSIS — Z9109 Other allergy status, other than to drugs and biological substances: Secondary | ICD-10-CM | POA: Diagnosis not present

## 2017-06-01 DIAGNOSIS — G4730 Sleep apnea, unspecified: Secondary | ICD-10-CM | POA: Diagnosis not present

## 2017-06-01 DIAGNOSIS — F809 Developmental disorder of speech and language, unspecified: Secondary | ICD-10-CM | POA: Diagnosis not present

## 2017-06-01 DIAGNOSIS — I10 Essential (primary) hypertension: Secondary | ICD-10-CM

## 2017-06-01 DIAGNOSIS — F4323 Adjustment disorder with mixed anxiety and depressed mood: Secondary | ICD-10-CM

## 2017-06-01 MED ORDER — AMLODIPINE BESYLATE 5 MG PO TABS: 5.0000 mg | ORAL_TABLET | Freq: Every day | ORAL | 3 refills | Status: DC

## 2017-06-01 NOTE — Assessment & Plan Note (Signed)
Discussed he should follow his manuals directions for cleansing the CPAP equipment. Advised I did not think that the machine would be helpful in cleansing more so than soap and water.

## 2017-06-01 NOTE — Assessment & Plan Note (Signed)
Patient has seen neurology and has been evaluated. Symptoms felt not to be related to TIA. He'll proceed with neuropsychiatric testing through neurology. Follow up with him after he follows up with them in August.

## 2017-06-01 NOTE — Assessment & Plan Note (Signed)
Discussed continued use of Flonase. Can add Zyrtec or Claritin.

## 2017-06-01 NOTE — Progress Notes (Signed)
  Tommi Rumps, MD Phone: [phone]  Daniel Calderon is a 51 y.o. male who presents today for follow-up.  Hypertension: Typically in the 130s over 80s. He is taking Norvasc. No chest pain. Does note some chronic shortness of breath which has been evaluated with a negative stress test. He does report feeling as though he gets a rush of blood to his legs after he goes up stairs. Does well after about 20 minutes.  He saw neurology following what was felt to be possibly a TIA. They felt it was not a TIA. They're looking at doing neuropsychiatric testing. Patient reports for some time now he said issues getting his words out only when somebody is in his left visual field. He notes no new neurological deficits.  Patient notes chronic issues with sinus drainage. Notes congestion and blowing clear mucus out of his nose. Does occasionally use Flonase and an over-the-counter allergy tablet.  Anxiety/depression: Doesn't note anything significant. No SI. Is taking Zoloft.  Patient asks about a electronic cleanser for his CPAP machine called soclean 2. Uses CPAP daily. Cleanse this with soap and water. Tries to get it to dry completely.  PMH: nonsmoker.   ROS see history of present illness  Objective  Physical Exam Vitals:   06/01/17 0848  BP: 118/80  Pulse: 74  Temp: 98.3 F (36.8 C)    BP Readings from Last 3 Encounters:  06/01/17 118/80  05/01/17 130/82  05/01/17 130/80   Wt Readings from Last 3 Encounters:  06/01/17 205 lb 6.4 oz (93.2 kg)  05/01/17 204 lb (92.5 kg)  03/19/17 210 lb 12 oz (95.6 kg)    Physical Exam  Constitutional: No distress.  HENT:  Head: Normocephalic and atraumatic.  Mouth/Throat: Oropharynx is clear and moist. No oropharyngeal exudate.  Normal TMs bilaterally  Eyes: Conjunctivae are normal. Pupils are equal, round, and reactive to light.  Cardiovascular: Normal rate, regular rhythm and normal heart sounds.   2+ DP pulses  Pulmonary/Chest:  Effort normal and breath sounds normal.  Musculoskeletal: He exhibits no edema.  Neurological: He is alert. Gait normal.  Skin: He is not diaphoretic.     Assessment/Plan: Please see individual problem list.  Hypertension At goal. Continue Norvasc.  Language difficulty Patient has seen neurology and has been evaluated. Symptoms felt not to be related to TIA. He'll proceed with neuropsychiatric testing through neurology. Follow up with him after he follows up with them in August.  Sleep apnea Discussed he should follow his manuals directions for cleansing the CPAP equipment. Advised I did not think that the machine would be helpful in cleansing more so than soap and water.  Adjustment disorder with mixed anxiety and depressed mood Well-controlled. Continue Zoloft.  Environmental allergies Discussed continued use of Flonase. Can add Zyrtec or Claritin.   No orders of the defined types were placed in this encounter.   Meds ordered this encounter  Medications  . amLODipine (NORVASC) 5 MG tablet    Sig: Take 1 tablet (5 mg total) by mouth daily.    Dispense:  90 tablet    Refill:  [place], MD [place]

## 2017-06-01 NOTE — Assessment & Plan Note (Signed)
At goal. Continue Norvasc. 

## 2017-06-01 NOTE — Patient Instructions (Signed)
Nice to see you. Please continue Flonase and allergy medications for your sinus issues. I would recommend cleansing your CPAP water and letting it dry completely and would recommend looking at the instruction manual as well to determine how your specific CPAP is supposed to be cleaned. Please follow up with the neurologist regarding your further tests.

## 2017-06-01 NOTE — Assessment & Plan Note (Signed)
Well-controlled.  Continue Zoloft. 

## 2017-07-22 ENCOUNTER — Other Ambulatory Visit: Payer: Self-pay | Admitting: Family Medicine

## 2017-07-27 ENCOUNTER — Encounter: Payer: Self-pay | Admitting: Psychology

## 2017-08-12 DIAGNOSIS — J329 Chronic sinusitis, unspecified: Secondary | ICD-10-CM | POA: Diagnosis not present

## 2017-08-12 DIAGNOSIS — J3489 Other specified disorders of nose and nasal sinuses: Secondary | ICD-10-CM | POA: Diagnosis not present

## 2017-08-12 DIAGNOSIS — J309 Allergic rhinitis, unspecified: Secondary | ICD-10-CM | POA: Diagnosis not present

## 2017-08-13 ENCOUNTER — Encounter: Payer: Self-pay | Admitting: Psychology

## 2017-08-19 ENCOUNTER — Telehealth: Payer: Self-pay | Admitting: Neurology

## 2017-08-19 NOTE — Telephone Encounter (Signed)
Patient is coming in to the office to See Dr. Si Raider and have testing done in January 2019. He was wanting to know what he was being referred to [facility] for? Please Call. Thanks

## 2017-08-19 NOTE — Telephone Encounter (Signed)
Rtrnd Pt's call, advsd him was being referred for short term memory issues and speech. Pt was concerned would not be covered by insurance, advsd him should be no different than when see's Dr Tomi Likens

## 2017-08-20 ENCOUNTER — Other Ambulatory Visit
Admission: RE | Admit: 2017-08-20 | Discharge: 2017-08-20 | Disposition: A | Discharge status: Home / Self Care | Payer: 59 | Source: Ambulatory Visit | Attending: Unknown Physician Specialty | Admitting: Unknown Physician Specialty

## 2017-08-20 DIAGNOSIS — J301 Allergic rhinitis due to pollen: Secondary | ICD-10-CM | POA: Diagnosis not present

## 2017-08-23 LAB — MISC LABCORP TEST (SEND OUT): LABCORP TEST CODE: 672842

## 2017-09-01 ENCOUNTER — Ambulatory Visit (INDEPENDENT_AMBULATORY_CARE_PROVIDER_SITE_OTHER): Payer: 59 | Admitting: Family Medicine

## 2017-09-01 ENCOUNTER — Encounter: Payer: Self-pay | Admitting: Family Medicine

## 2017-09-01 VITALS — BP 130/90 | HR 84 | Temp 98.2°F | Wt 210.6 lb

## 2017-09-01 DIAGNOSIS — E291 Testicular hypofunction: Secondary | ICD-10-CM | POA: Diagnosis not present

## 2017-09-01 DIAGNOSIS — T148XXA Other injury of unspecified body region, initial encounter: Secondary | ICD-10-CM

## 2017-09-01 DIAGNOSIS — R5383 Other fatigue: Secondary | ICD-10-CM | POA: Diagnosis not present

## 2017-09-01 DIAGNOSIS — R0981 Nasal congestion: Secondary | ICD-10-CM

## 2017-09-01 DIAGNOSIS — F809 Developmental disorder of speech and language, unspecified: Secondary | ICD-10-CM

## 2017-09-01 DIAGNOSIS — I10 Essential (primary) hypertension: Secondary | ICD-10-CM

## 2017-09-01 HISTORY — DX: Nasal congestion: R09.81

## 2017-09-01 LAB — VITAMIN B12: Vitamin B-12: 531 pg/mL (ref 211–911)

## 2017-09-01 MED ORDER — AMLODIPINE BESYLATE 10 MG PO TABS: 10.0000 mg | ORAL_TABLET | Freq: Every day | ORAL | 3 refills | Status: DC

## 2017-09-01 NOTE — Assessment & Plan Note (Signed)
Patient will follow up with urology. Decreased energy could be related to his testosterone. We'll additionally check a B12 given family history.

## 2017-09-01 NOTE — Progress Notes (Signed)
  Tommi Rumps, MD Phone: [phone]  Daniel Calderon is a 51 y.o. male who presents today for f/u.  HYPERTENSION  Disease Monitoring  Home BP Monitoring 135/85 Chest pain- no    Dyspnea- yes, see below Medications  Compliance-  Taking amlodipine.  Upper respiratory issues: Following with ENT for this. They had him on a steroid taper which he just finished. Also have him taking an antibiotic. His issues have been improving. They're planning on doing surgery at some point. Patient notes his breathing issues are related to his upper respiratory issues. He is unable to take a deep breath in through his nose and given that he is missing a scalene muscle it's difficult for him to expand his chest completely. He notes this has been stable  Patient reports left pectoral muscle tightness that spasms and feels sore. Comes out of nowhere. Goes away on its own. It is not exertional. No associated breathing issues.  Patient does note some decreased energy levels. He has been on testosterone supplements in the past though recently came off of these and his testosterone checked at an outside clinic. He reports it was 168. He does report his father has history of B12 deficiency and is interested in having that checked as well.  Patient reports he has rescheduled his appointment with neuropsychology for his speech and memory issues.  PMH: nonsmoker.   ROS see history of present illness  Objective  Physical Exam Vitals:   09/01/17 0855 09/01/17 0910  BP: 138/90 130/90  Pulse: 84   Temp: 98.2 F (36.8 C)   SpO2: 98%     BP Readings from Last 3 Encounters:  09/01/17 130/90  06/01/17 118/80  05/01/17 130/82   Wt Readings from Last 3 Encounters:  09/01/17 210 lb 9.6 oz (95.5 kg)  06/01/17 205 lb 6.4 oz (93.2 kg)  05/01/17 204 lb (92.5 kg)    Physical Exam  Constitutional: He is well-developed, well-nourished, and in no distress.  Cardiovascular: Normal rate and regular rhythm.     Pulmonary/Chest: Effort normal and breath sounds normal. He exhibits tenderness (over left lateral pectoral).  Musculoskeletal: He exhibits no edema.  Neurological: He is alert. Gait normal.  Skin: Skin is warm and dry.     Assessment/Plan: Please see individual problem list.  Hypertension Slightly uncontrolled. We'll increase amlodipine to 10 mg.  Hypogonadism in male Patient will follow up with urology. Decreased energy could be related to his testosterone. We'll additionally check a B12 given family history.  Sinus congestion Following with ENT. Currently on antibiotics. I suspect this is contributing to his breathing issues. He's had cardiac evaluation previously. He'll continue to follow with ENT and consider surgery.  Muscle strain Suspect pectoral muscle strain given exam and history. Discussed rest and ice. If not improving could have him see sports medicine.  Language difficulty He'll complete neuropsychiatric testing as scheduled. Continue to follow with neurology.   Orders Placed This Encounter  Procedures  . B12    Meds ordered this encounter  Medications  . amLODipine (NORVASC) 10 MG tablet    Sig: Take 1 tablet (10 mg total) by mouth daily.    Dispense:  90 tablet    Refill:  [place], MD [place]

## 2017-09-01 NOTE — Assessment & Plan Note (Signed)
He'll complete neuropsychiatric testing as scheduled. Continue to follow with neurology.

## 2017-09-01 NOTE — Assessment & Plan Note (Signed)
Slightly uncontrolled. We'll increase amlodipine to 10 mg.

## 2017-09-01 NOTE — Assessment & Plan Note (Signed)
Suspect pectoral muscle strain given exam and history. Discussed rest and ice. If not improving could have him see sports medicine.

## 2017-09-01 NOTE — Assessment & Plan Note (Signed)
Following with ENT. Currently on antibiotics. I suspect this is contributing to his breathing issues. He's had cardiac evaluation previously. He'll continue to follow with ENT and consider surgery.

## 2017-09-01 NOTE — Patient Instructions (Addendum)
Nice to see you. Please try to rest the muscle strain if possible. You can also ice this area. We'll check B12 and contact you with the results. We will have you increase your amlodipine dose to 10 mg daily. We'll have you return in one month for nurse blood pressure check.

## 2017-09-03 ENCOUNTER — Other Ambulatory Visit: Payer: Self-pay | Admitting: Unknown Physician Specialty

## 2017-09-03 ENCOUNTER — Ambulatory Visit: Payer: Self-pay | Admitting: Neurology

## 2017-09-03 DIAGNOSIS — J329 Chronic sinusitis, unspecified: Secondary | ICD-10-CM | POA: Diagnosis not present

## 2017-09-03 DIAGNOSIS — J310 Chronic rhinitis: Secondary | ICD-10-CM | POA: Diagnosis not present

## 2017-09-03 DIAGNOSIS — R0981 Nasal congestion: Secondary | ICD-10-CM | POA: Diagnosis not present

## 2017-09-04 ENCOUNTER — Ambulatory Visit: Payer: 59

## 2017-09-17 ENCOUNTER — Encounter: Payer: Self-pay | Admitting: Psychology

## 2017-09-25 ENCOUNTER — Other Ambulatory Visit: Payer: Self-pay

## 2017-09-25 ENCOUNTER — Other Ambulatory Visit: Payer: 59

## 2017-09-25 DIAGNOSIS — E291 Testicular hypofunction: Secondary | ICD-10-CM

## 2017-09-25 NOTE — Progress Notes (Signed)
09/28/2017 11:46 AM   Daniel Calderon 1966/11/13 [id number]  Referring provider: [name], MD [address] [place], [place] [address]  Chief Complaint  Patient presents with  . Hypogonadism    last seen 08/2016  . Benign Prostatic Hypertrophy    HPI: Patient is a 51 year old Caucasian male with testosterone deficiency, erectile dysfunction and BPH with LUTS who presents today for a follow up.   Testosterone deficiency Patient is experiencing a decrease in libido, a lack of energy, a decrease in strength, a decreased enjoyment in life, sadness and/or grumpiness, erections being less strong, a recent deterioration in an ability to play sports, falling asleep after dinner and a recent deterioration in their work performance.   This is indicated by his responses to the ADAM questionnaire.  He is not having spontaneous erections at night.  He has sleep apnea and is sleeping with a CPAP machine.   His most recent testosterone level was 283.5 ng/dL on 12/19/2016.  He is currently managing his testosterone deficiency with Testopel insertion.  His las insertion was 09/18/2016 with 6 pellets.  He states that his insurance will no longer cover the Testopel.  He would like to try the gels.       Androgen Deficiency in the Aging Male    Conception Name 09/28/17 1100         Androgen Deficiency in the Aging Male   Do you have a decrease in libido (sex drive) Yes     Do you have lack of energy Yes     Do you have a decrease in strength and/or endurance Yes     Have you lost height No     Have you noticed a decreased "enjoyment of life" Yes     Are you sad and/or grumpy Yes     Are your erections less strong Yes     Have you noticed a recent deterioration in your ability to play sports Yes     Are you falling asleep after dinner Yes     Has there been a recent deterioration in your work performance Yes        Erectile dysfunction His SHIM score is 8, which is moderate  ED.   His previous SHIM score was 10.  He has been having difficulty with erections for the last several years .   His major complaint is his confidence that he could achieve an erection.  His libido is diminished.   His risk factors for ED are age, BPH, sleep apnea, testosterone deficiency, anxiety and depression.  He denies any painful erections or curvatures with his erections.   He has tried Cialis in the past with good effect.        SHIM    Row Name 09/28/17 1116         SHIM: Over the last 6 months:   How do you rate your confidence that you could get and keep an erection? Very Low     When you had erections with sexual stimulation, how often were your erections hard enough for penetration (entering your partner)? A Few Times (much less than half the time)     During sexual intercourse, how often were you able to maintain your erection after you had penetrated (entered) your partner? A Few Times (much less than half the time)     During sexual intercourse, how difficult was it to maintain your erection to completion of intercourse? Extremely Difficult  When you attempted sexual intercourse, how often was it satisfactory for you? A Few Times (much less than half the time)       SHIM Total Score   SHIM 8        Score: 1-7 Severe ED 8-11 Moderate ED 12-16 Mild-Moderate ED 17-21 Mild ED 22-25 No ED  BPH WITH LUTS His IPSS score today is 12, which is moderate lower urinary tract symptomatology. He is mixed with his quality life due to his urinary symptoms.  His PVR is 0 mL.  His previous IPSS score was 12/0.  His major complaint today is urinary frequency.  He has had these symptoms for the last few years.  He denies any dysuria, hematuria or suprapubic pain.  He also denies any recent fevers, chills, nausea or vomiting.  He does not have a family history of PCa.     IPSS    Row Name 09/28/17 1100         International Prostate Symptom Score   How often have you had the  sensation of not emptying your bladder? Less than half the time     How often have you had to urinate less than every two hours? About half the time     How often have you found you stopped and started again several times when you urinated? Less than 1 in 5 times     How often have you found it difficult to postpone urination? Not at All     How often have you had a weak urinary stream? Less than 1 in 5 times     How often have you had to strain to start urination? Not at All     How many times did you typically get up at night to urinate? 5 Times     Total IPSS Score 12       Quality of Life due to urinary symptoms   If you were to spend the rest of your life with your urinary condition just the way it is now how would you feel about that? Mixed        Score:  1-7 Mild 8-19 Moderate 20-35 Severe    PMH: Past Medical History:  Diagnosis Date  . Allergy    Seasonal  . Anxiety   . Benign enlargement of prostate   . Depression   . Elevated BP   . Elevated transaminase level   . Failure of erection   . Fatigue   . Frequent headaches   . Hypogonadism in male   . Kidney stones    History of kidney stones  . Migraine   . Sleep apnea    Currently uses cpap    Surgical History: Past Surgical History:  Procedure Laterality Date  . Left Shoulder Surgery  2011  . nerve block     2 in neck. 5 in back.  . NOSE SURGERY    . SCALENE NODE BIOPSY / EXCISION    . VASECTOMY      Home Medications:  Allergies as of 09/28/2017      Reactions   Erythromycin Nausea And Vomiting   Cephalexin    Other reaction(s): Other (See Comments) Other Reaction: Other reaction   Dexamethasone    Other reaction(s): Other (See Comments) Other Reaction: Other reaction   Oxycodone-acetaminophen    Other reaction(s): Other (See Comments) Other Reaction: Other reaction   Oxycodone-acetaminophen    Other reaction(s): Other (See Comments) Other Reaction: Other reaction   Prednisone  Other  reaction(s): Other (See Comments) Other Reaction: Other reaction   Gabapentin Other (See Comments)   Aggressive   Ibuprofen Itching      Medication List       Accurate as of 09/28/17 11:46 AM. Always use your most recent med list.          amLODipine 10 MG tablet Commonly known as:  NORVASC Take 1 tablet (10 mg total) by mouth daily.   aspirin 81 MG EC tablet Take 1 tablet (81 mg total) by mouth daily.   fexofenadine-pseudoephedrine 60-120 MG 12 hr tablet Commonly known as:  ALLEGRA-D Take 1 tablet by mouth 2 (two) times daily.   fluticasone 50 MCG/ACT nasal spray Commonly known as:  FLONASE Place 2 sprays into both nostrils daily.   ibuprofen 200 MG tablet Commonly known as:  ADVIL,MOTRIN Take 200 mg by mouth as needed.   multivitamin tablet Take 1 tablet by mouth daily.   omega-3 acid ethyl esters 1 g capsule Commonly known as:  LOVAZA Take 1 g by mouth daily.   omeprazole 20 MG capsule Commonly known as:  PRILOSEC TAKE 1 CAPSULE BY MOUTH DAILY.   sertraline 100 MG tablet Commonly known as:  ZOLOFT TAKE 1 TABLET BY MOUTH ONCE DAILY   Testosterone 10 MG/ACT (2%) Gel Place 2 Act onto the skin daily.       Allergies:  Allergies  Allergen Reactions  . Erythromycin Nausea And Vomiting  . Cephalexin     Other reaction(s): Other (See Comments) Other Reaction: Other reaction  . Dexamethasone     Other reaction(s): Other (See Comments) Other Reaction: Other reaction  . Oxycodone-Acetaminophen     Other reaction(s): Other (See Comments) Other Reaction: Other reaction  . Oxycodone-Acetaminophen     Other reaction(s): Other (See Comments) Other Reaction: Other reaction  . Prednisone     Other reaction(s): Other (See Comments) Other Reaction: Other reaction  . Gabapentin Other (See Comments)    Aggressive  . Ibuprofen Itching    Family History: Family History  Problem Relation Age of Onset  . Hypertension Mother        Living  . Hearing loss  Mother   . Heart disease Other   . Healthy Father        Living  . Diabetes Brother   . Stroke Paternal Uncle   . Heart attack Paternal Uncle   . Hearing loss Maternal Grandmother   . Healthy Son   . Healthy Daughter   . Kidney cancer Neg Hx   . Bladder Cancer Neg Hx   . Prostate cancer Neg Hx     Social History:  reports that he has never smoked. He has never used smokeless tobacco. He reports that he drinks about 1.2 oz of alcohol per week . He reports that he does not use drugs.  ROS: UROLOGY Frequent Urination?: No Hard to postpone urination?: No Burning/pain with urination?: No Get up at night to urinate?: No Leakage of urine?: No Urine stream starts and stops?: No Trouble starting stream?: No Do you have to strain to urinate?: No Blood in urine?: No Urinary tract infection?: No Sexually transmitted disease?: No Injury to kidneys or bladder?: No Painful intercourse?: No Weak stream?: No Erection problems?: Yes Penile pain?: No  Gastrointestinal Nausea?: No Vomiting?: No Indigestion/heartburn?: No Diarrhea?: No Constipation?: No  Constitutional Fever: No Night sweats?: Yes Weight loss?: No Fatigue?: Yes  Skin Skin rash/lesions?: No Itching?: No  Eyes Blurred vision?: Yes Double vision?: No  Ears/Nose/Throat Sore throat?: No Sinus problems?: Yes  Hematologic/Lymphatic Swollen glands?: No Easy bruising?: No  Cardiovascular Leg swelling?: No Chest pain?: Yes  Respiratory Cough?: No Shortness of breath?: Yes  Endocrine Excessive thirst?: No  Musculoskeletal Back pain?: Yes Joint pain?: Yes  Neurological Headaches?: No Dizziness?: No  Psychologic Depression?: No Anxiety?: No  Physical Exam: BP 134/82   Pulse 80   Ht 6\' 2"  (1.88 m)   Wt 215 lb 1.6 oz (97.6 kg)   BMI 27.62 kg/m   Constitutional: Well nourished. Alert and oriented, No acute distress. HEENT: [place] AT, moist mucus membranes. Trachea midline, no  masses. Cardiovascular: No clubbing, cyanosis, or edema. Respiratory: Normal respiratory effort, no increased work of breathing. GI: Abdomen is soft, non tender, non distended, no abdominal masses. Liver and spleen not palpable.  No hernias appreciated.  Stool sample for occult testing is not indicated.   GU: No CVA tenderness.  No bladder fullness or masses.  Patient with circumcised phallus. Urethral meatus is patent.  No penile discharge. No penile lesions or rashes. Scrotum without lesions, cysts, rashes and/or edema.  Testicles are located scrotally bilaterally. No masses are appreciated in the testicles. Left and right epididymis are normal. Rectal: Patient with  normal sphincter tone. Anus and perineum without scarring or rashes. No rectal masses are appreciated. Prostate is approximately 50 grams, no nodules are appreciated. Seminal vesicles are normal. Skin: No rashes, bruises or suspicious lesions. Lymph: No cervical or inguinal adenopathy. Neurologic: Grossly intact, no focal deficits, moving all 4 extremities. Psychiatric: Normal mood and affect.  Laboratory Data: Lab Results  Component Value Date   WBC 8.5 02/20/2017   HGB 13.7 02/20/2017   HCT 39.9 (L) 02/20/2017   MCV 81.1 02/20/2017   PLT 185 02/20/2017    Lab Results  Component Value Date   CREATININE 1.19 02/20/2017    Lab Results  Component Value Date   TESTOSTERONE 283.5 12/19/2016    Lab Results  Component Value Date   TSH 1.987 02/20/2017       Component Value Date/Time   CHOL 184 02/20/2017 0815   CHOL 195 12/03/2015 1444   HDL 36 (L) 02/20/2017 0815   HDL 38 (L) 12/03/2015 1444   CHOLHDL 5.1 02/20/2017 0815   VLDL 40 02/20/2017 0815   LDLCALC 108 (H) 02/20/2017 0815   LDLCALC 104 (H) 12/03/2015 1444    Lab Results  Component Value Date   AST 43 (H) 02/19/2017   Lab Results  Component Value Date   ALT 53 02/19/2017   PSA 0.3 ng/mL on 09/02/2016  I have reviewed the labs.  Assessment &  Plan:    1. Testosterone deficiency     -most recent testosterone level is 283.5ng/dL on 12/19/2016  - will have a trial of testosterone 10 mg/act (2%) gel - 2 pumps daily  - RTC in one month for testosterone level  2. BPH with LUTS  - IPSS score is 12/3, it is worsening  - Continue conservative management, avoiding bladder irritants and timed voiding's  - most bothersome symptom is frequency - Myrbetriq 25 mg daily, # 28 samples given  - RTC in 6 months for IPSS, PSA and exam, as testosterone therapy can cause prostate enlargement and worsen LUTS  3. Erectile dysfunction:     -SHIM score is 8, it is worsening   -continue Cialis 20 mg on demand dosing, refill given  -RTC in 6 months for SHIM score and exam, as testosterone therapy can affect erections  Return in about 1 month (around 10/29/2017) for testosterone level, does not need to be a morning draw.  These notes generated with voice recognition software. I apologize for typographical errors.  Zara Council, [facility] [address] [place], [place] [address] [phone]

## 2017-09-28 ENCOUNTER — Ambulatory Visit (INDEPENDENT_AMBULATORY_CARE_PROVIDER_SITE_OTHER): Payer: 59 | Admitting: Urology

## 2017-09-28 ENCOUNTER — Encounter: Payer: Self-pay | Admitting: Urology

## 2017-09-28 VITALS — BP 134/82 | HR 80 | Ht 74.0 in | Wt 215.1 lb

## 2017-09-28 DIAGNOSIS — E349 Endocrine disorder, unspecified: Secondary | ICD-10-CM

## 2017-09-28 DIAGNOSIS — N529 Male erectile dysfunction, unspecified: Secondary | ICD-10-CM | POA: Diagnosis not present

## 2017-09-28 DIAGNOSIS — N138 Other obstructive and reflux uropathy: Secondary | ICD-10-CM | POA: Diagnosis not present

## 2017-09-28 DIAGNOSIS — E291 Testicular hypofunction: Secondary | ICD-10-CM | POA: Diagnosis not present

## 2017-09-28 DIAGNOSIS — N401 Enlarged prostate with lower urinary tract symptoms: Secondary | ICD-10-CM | POA: Diagnosis not present

## 2017-09-28 LAB — BLADDER SCAN AMB NON-IMAGING: SCAN RESULT: 0

## 2017-09-28 MED ORDER — TESTOSTERONE 10 MG/ACT (2%) TD GEL: 2.0000 | Freq: Every day | TRANSDERMAL | 5 refills | Status: DC

## 2017-09-29 ENCOUNTER — Telehealth: Payer: Self-pay | Admitting: Family Medicine

## 2017-09-29 ENCOUNTER — Telehealth: Payer: Self-pay

## 2017-09-29 DIAGNOSIS — D649 Anemia, unspecified: Secondary | ICD-10-CM

## 2017-09-29 LAB — PSA: Prostate Specific Ag, Serum: 0.3 ng/mL (ref 0.0–4.0)

## 2017-09-29 LAB — HEMOGLOBIN: Hemoglobin: 12.9 g/dL — ABNORMAL LOW (ref 13.0–17.7)

## 2017-09-29 LAB — HEMATOCRIT: HEMATOCRIT: 39.3 % (ref 37.5–51.0)

## 2017-09-29 LAB — TESTOSTERONE: TESTOSTERONE: 218 ng/dL — AB (ref 264–916)

## 2017-09-29 NOTE — Telephone Encounter (Signed)
Please advise 

## 2017-09-29 NOTE — Telephone Encounter (Signed)
-----   Message from Nori Riis, PA-C sent at 09/29/2017  7:36 AM EDT ----- Please let Daniel Calderon know that his hemoglobin is low.  I know he is having issues with B12 deficiencies and testosterone deficiencies, but he should let Dr. Caryl Bis know about his low level.  He may want to check for blood in the stool, etc.

## 2017-09-29 NOTE — Telephone Encounter (Signed)
Pt called and stated that [facility] just called him with lab results and they stated that his hemoglobin was low. Please advise, thank you!

## 2017-09-29 NOTE — Telephone Encounter (Signed)
His hemoglobin was 12.9. The normal range is 13. It has trended down somewhat. We should have him do stool studies for blood. Please create stool cards in place at front from a pickup. Plan on rechecking in 2 weeks.

## 2017-09-29 NOTE — Telephone Encounter (Signed)
Spoke with pt in reference to lab results. Made aware should call PCP. Pt voiced understanding.

## 2017-09-29 NOTE — Telephone Encounter (Signed)
Patient notified and scheduled please place order

## 2017-09-30 NOTE — Addendum Note (Signed)
Addended by: [name] on: 09/30/2017 05:22 PM   Modules accepted: Orders

## 2017-09-30 NOTE — Telephone Encounter (Signed)
Ordered

## 2017-10-01 ENCOUNTER — Ambulatory Visit (INDEPENDENT_AMBULATORY_CARE_PROVIDER_SITE_OTHER): Payer: 59

## 2017-10-01 ENCOUNTER — Telehealth: Payer: Self-pay

## 2017-10-01 VITALS — BP 118/82 | HR 75

## 2017-10-01 DIAGNOSIS — K625 Hemorrhage of anus and rectum: Secondary | ICD-10-CM

## 2017-10-01 DIAGNOSIS — I10 Essential (primary) hypertension: Secondary | ICD-10-CM | POA: Diagnosis not present

## 2017-10-01 NOTE — Telephone Encounter (Signed)
Left voice mail to call back 

## 2017-10-01 NOTE — Telephone Encounter (Signed)
Spoke with patient and advised of below.  Hospital has new equipment and they are doing some testing and needed some volunteers and he will have results sent here for your review.  If symptoms worsen he go get evaluated.

## 2017-10-01 NOTE — Telephone Encounter (Signed)
Spoke with patient advised of below , he picked up stool cards today will complete  , advised we could refer to GI. Would like to be referred to GI. He   states he has shortness of breath walking from one end of the room to the other and with walking up stairs  , tremors present in legs denies abdominal pain.these symptoms have increased.  Advised patient no appointments available today he could go to urgent care for evaluation today to be evaluated.  Patient agreed to go for further work up today.

## 2017-10-01 NOTE — Progress Notes (Signed)
Patient advised of below and verbalized understanding.  

## 2017-10-01 NOTE — Telephone Encounter (Signed)
Reason for call: blood in stool  present , in for nurse visit Symptoms: blood in stool bright red , present in toliet, notice when wiping and bowel movement  Duration been there for years 10 plus years,  Urologist found low hemoglobin advised stool cards up front for pick up, history of hemorroids fatigue present,  Medications: Last seen for this problem: Seen by: Advised of symptoms worsen to go to urgent care evaluation , and also to pick up stool cards for completion and keep lab appointment as scheduled

## 2017-10-01 NOTE — Progress Notes (Signed)
Patient comes in for blood pressure check after increasing amlodipine 10 mg .   Checked blood pressure in left arm and Blood pressure was 124/80 patient has not been monitoring blood pressure at home.

## 2017-10-01 NOTE — Progress Notes (Signed)
BP well controlled. Continue current regimen.   Tommi Rumps, MD

## 2017-10-01 NOTE — Telephone Encounter (Signed)
Agree with stool cards. Agree with repeat lab work. Given blood in stool we could refer to GI as well. If he has worsening symptoms or his fatigue worsens or develops chest pain, shortness of breath, palpitations, or abdominal pain he needs to go be evaluated.

## 2017-10-01 NOTE — Telephone Encounter (Signed)
GI referral placed. Please confirm that he was evaluated. Thanks.

## 2017-10-02 LAB — BASIC METABOLIC PANEL
BUN: 17 (ref 4–21)
CREATININE: 1 (ref 0.6–1.3)
Glucose: 100
POTASSIUM: 4 (ref 3.4–5.3)
SODIUM: 140 (ref 137–147)

## 2017-10-02 LAB — HEPATIC FUNCTION PANEL
ALT: 85 — AB (ref 10–40)
AST: 59 — AB (ref 14–40)
Alkaline Phosphatase: 81 (ref 25–125)
Bilirubin, Direct: 0.1 (ref 0.01–0.4)
Bilirubin, Total: 0.6

## 2017-10-02 LAB — LIPID PANEL
CHOLESTEROL: 198 (ref 0–200)
HDL: 46 (ref 35–70)
LDL Cholesterol: 119
TRIGLYCERIDES: 167 — AB (ref 40–160)

## 2017-10-02 LAB — CBC AND DIFFERENTIAL
HCT: 42 (ref 41–53)
Hemoglobin: 14 (ref 13.5–17.5)
PLATELETS: 197 (ref 150–399)
WBC: 7.3

## 2017-10-05 ENCOUNTER — Telehealth: Payer: Self-pay | Admitting: Family Medicine

## 2017-10-05 NOTE — Telephone Encounter (Signed)
Pt called back returning your call. Please advise, thank you!  Call pt @ [phone]

## 2017-10-13 ENCOUNTER — Encounter: Payer: Self-pay | Admitting: Family Medicine

## 2017-10-13 ENCOUNTER — Other Ambulatory Visit (INDEPENDENT_AMBULATORY_CARE_PROVIDER_SITE_OTHER): Payer: 59

## 2017-10-13 DIAGNOSIS — D649 Anemia, unspecified: Secondary | ICD-10-CM | POA: Diagnosis not present

## 2017-10-13 LAB — CBC
HEMATOCRIT: 41.6 % (ref 39.0–52.0)
HEMOGLOBIN: 14 g/dL (ref 13.0–17.0)
MCHC: 33.6 g/dL (ref 30.0–36.0)
MCV: 82.4 fl (ref 78.0–100.0)
Platelets: 201 10*3/uL (ref 150.0–400.0)
RBC: 5.05 Mil/uL (ref 4.22–5.81)
RDW: 15.3 % (ref 11.5–15.5)
WBC: 7.2 10*3/uL (ref 4.0–10.5)

## 2017-10-13 NOTE — Telephone Encounter (Signed)
Patient advised of results from BP check

## 2017-10-16 ENCOUNTER — Other Ambulatory Visit: Payer: Self-pay

## 2017-10-16 ENCOUNTER — Telehealth: Payer: Self-pay

## 2017-10-16 ENCOUNTER — Encounter: Payer: Self-pay | Admitting: Gastroenterology

## 2017-10-16 ENCOUNTER — Ambulatory Visit (INDEPENDENT_AMBULATORY_CARE_PROVIDER_SITE_OTHER): Payer: 59 | Admitting: Gastroenterology

## 2017-10-16 VITALS — BP 133/87 | HR 73 | Temp 98.7°F | Ht 74.0 in | Wt 212.2 lb

## 2017-10-16 DIAGNOSIS — K625 Hemorrhage of anus and rectum: Secondary | ICD-10-CM

## 2017-10-16 DIAGNOSIS — R1030 Lower abdominal pain, unspecified: Secondary | ICD-10-CM

## 2017-10-16 DIAGNOSIS — R7401 Elevation of levels of liver transaminase levels: Secondary | ICD-10-CM

## 2017-10-16 DIAGNOSIS — R740 Nonspecific elevation of levels of transaminase and lactic acid dehydrogenase [LDH]: Secondary | ICD-10-CM | POA: Diagnosis not present

## 2017-10-16 NOTE — Telephone Encounter (Signed)
Patients wife has been informed of ultrasound scheduled for her husband Tue at 9:30 am [place] nothing to eat or drink after midnight. (Pts voice mail was full)  Thanks Sharyn Lull

## 2017-10-16 NOTE — Progress Notes (Signed)
[name], MD [address]  [place]  [place], [place] [address]  Main: [phone]  Fax: [phone]    Gastroenterology Consultation  Referring Provider:     [name], MD Primary Care Physician:  [name], MD Primary Gastroenterologist:  Dr. [name] Reason for Consultation:     Rectal bleeding        HPI:   Daniel Calderon is a 51 y.o. y/o male referred by Dr. Caryl Bis, Angela Adam, MD  for consultation & management of rectal bleeding.  He has history of testosterone deficiency, erectile dysfunction and BPH with LUTS and chronic fatigue.  He was found to have mild normocytic anemia, hemoglobin 12.9 on 09/28/2017.  Patient started taking oral iron and B12 supplements, his repeat hemoglobin was normal at 4 days later, improved to 14.  He also has chronic fatigue probably secondary to testosterone deficiency.  He gets tired easily, unable to exercise.  He reports that he had a cardiology workup and it was unremarkable.  He is incidentally found to have mildly elevated transaminases.  He has been experiencing several years history of mild, nagging lower abdominal discomfort associated with intermittent rectal bleeding.  Symptoms have remained the same.  He reports bleeding per rectum about 3 times a week associated with stool.  He has 1-2 formed bowel movements daily.  His weight has been stable and he never had a colonoscopy.  He denies any other GI symptoms.  He drinks alcohol about 4 beers per week.  He does not smoke tobacco. He denies family history of colon cancer, other GI malignancies or inflammatory bowel disease He denies having any GI surgeries He is not on any blood thinners  GI Procedures: None  Past Medical History:  Diagnosis Date  . Allergy    Seasonal  . Anxiety   . Benign enlargement of prostate   . Depression   . Elevated BP   . Elevated transaminase level   . Failure of erection   . Fatigue   . Frequent headaches   .  Hypogonadism in male   . Kidney stones    History of kidney stones  . Migraine   . Sleep apnea    Currently uses cpap    Past Surgical History:  Procedure Laterality Date  . Left Shoulder Surgery  2011  . nerve block     2 in neck. 5 in back.  . NOSE SURGERY    . SCALENE NODE BIOPSY / EXCISION    . VASECTOMY      Prior to Admission medications   Medication Sig Start Date End Date Taking? Authorizing Provider  amLODipine (NORVASC) 10 MG tablet Take 1 tablet (10 mg total) by mouth daily. 09/01/17 11/30/17 Yes [name], MD  fexofenadine-pseudoephedrine (ALLEGRA-D) 60-120 MG 12 hr tablet Take 1 tablet by mouth 2 (two) times daily. 05/01/17  Yes Sable Feil, PA-C  fluticasone [facility]) 50 MCG/ACT nasal spray Place 2 sprays into both nostrils daily. 07/16/15  Yes Doss, Velora Heckler, RN  ibuprofen (ADVIL,MOTRIN) 200 MG tablet Take 200 mg by mouth as needed.   Yes [provider]  Multiple Vitamin (MULTIVITAMIN) tablet Take 1 tablet by mouth daily.   Yes [provider]  omeprazole (PRILOSEC) 20 MG capsule TAKE 1 CAPSULE BY MOUTH DAILY. 11/19/16  Yes [name], MD  sertraline (ZOLOFT) 100 MG tablet TAKE 1 TABLET BY MOUTH ONCE DAILY 07/22/17  Yes [name], MD  Family History  Problem Relation Age of Onset  . Hypertension Mother        Living  . Hearing loss Mother   . Heart disease Other   . Healthy Father        Living  . Diabetes Brother   . Stroke Paternal Uncle   . Heart attack Paternal Uncle   . Hearing loss Maternal Grandmother   . Healthy Son   . Healthy Daughter   . Kidney cancer Neg Hx   . Bladder Cancer Neg Hx   . Prostate cancer Neg Hx      Social History  Substance Use Topics  . Smoking status: Never Smoker  . Smokeless tobacco: Never Used  . Alcohol use 1.2 oz/week    2 Cans of beer per week    Allergies as of 10/16/2017 - Review Complete 10/16/2017  Allergen Reaction Noted  . Erythromycin Nausea And Vomiting  03/24/2015  . Cephalexin  05/07/2015  . Dexamethasone  05/07/2015  . Oxycodone-acetaminophen  05/07/2015  . Oxycodone-acetaminophen  05/16/2016  . Prednisone  05/07/2015  . Gabapentin Other (See Comments) 06/26/2015  . Ibuprofen Itching 03/24/2015    Review of Systems:    All systems reviewed and negative except where noted in HPI.   Physical Exam:  BP 133/87   Pulse 73   Temp 98.7 F (37.1 C) (Oral)   Ht 6\' 2"  (1.88 m)   Wt 212 lb 3.2 oz (96.3 kg)   BMI 27.24 kg/m  No LMP for male patient.  General:   Alert,  Well-developed, well-nourished, pleasant and cooperative in NAD Head:  Normocephalic and atraumatic. Eyes:  Sclera clear, no icterus.   Conjunctiva pink. Ears:  Normal auditory acuity. Nose:  No deformity, discharge, or lesions. Mouth:  No deformity or lesions,oropharynx pink & moist. Neck:  Supple; no masses or thyromegaly. Lungs:  Respirations even and unlabored.  Clear throughout to auscultation.   No wheezes, crackles, or rhonchi. No acute distress. Heart:  Regular rate and rhythm; no murmurs, clicks, rubs, or gallops. Abdomen:  Normal bowel sounds.  No bruits.  Soft, non-tender and non-distended without masses, hepatosplenomegaly or hernias noted.  No guarding or rebound tenderness.   Rectal: Nor performed Msk:  Symmetrical without gross deformities. Good, equal movement & strength bilaterally. Pulses:  Normal pulses noted. Extremities:  No clubbing or edema.  No cyanosis. Neurologic:  Alert and oriented x3;  grossly normal neurologically. Skin:  Intact without significant lesions or rashes. No jaundice. Lymph Nodes:  No significant cervical adenopathy. Psych:  Alert and cooperative. Normal mood and affect.  Imaging Studies: No recent abdominal imaging  Assessment and Plan:   Daniel Calderon is a 51 y.o. Caucasian male with testosterone deficiency, erectile dysfunction and BPH with LUTS and chronic fatigue.  He has chronic history of mild lower abdominal  discomfort and intermittent rectal bleeding which has not progressed over the years.  He does not have B12 deficiency.  He also has mildly elevated transaminases.  Rectal bleeding and lower abdominal discomfort: -Rectal bleeding is most likely hemorrhoidal, however mild left-sided colitis is a possibility -Schedule colonoscopy for further evaluation  Chronic fatigue: Probably secondary to testosterone deficiency -No evidence of anemia, normal B12 levels -Check serum cortisol levels  Elevated LFTs: Most likely secondary to fatty liver -Check acute hepatitis panel -Right upper quadrant ultrasound   Follow up in 3 months   [name], MD

## 2017-10-18 ENCOUNTER — Telehealth: Payer: Self-pay | Admitting: Family Medicine

## 2017-10-18 NOTE — Telephone Encounter (Signed)
Please let the patient know that I reviewed his lab work. His liver function tests are slightly elevated. It appears he has been seeing GI for that. His iron is low. He should proceed with the colonoscopy as planned to evaluate for any blood loss. His LDH is slightly elevated. It looks like he may have had this previously as well. Please check with the patient to see if he had evaluation for that. Thanks.

## 2017-10-19 NOTE — Telephone Encounter (Signed)
Patient notified and states he has not heard of his LDH being elevated ands has not been evaluated for this

## 2017-10-20 ENCOUNTER — Ambulatory Visit
Admission: RE | Admit: 2017-10-20 | Discharge: 2017-10-20 | Disposition: A | Discharge status: Home / Self Care | Payer: 59 | Source: Ambulatory Visit | Attending: Gastroenterology | Admitting: Gastroenterology

## 2017-10-20 DIAGNOSIS — R740 Nonspecific elevation of levels of transaminase and lactic acid dehydrogenase [LDH]: Secondary | ICD-10-CM | POA: Insufficient documentation

## 2017-10-20 DIAGNOSIS — R7401 Elevation of levels of liver transaminase levels: Secondary | ICD-10-CM

## 2017-10-26 ENCOUNTER — Other Ambulatory Visit: Payer: Self-pay | Admitting: Family Medicine

## 2017-10-27 ENCOUNTER — Other Ambulatory Visit
Admission: RE | Admit: 2017-10-27 | Discharge: 2017-10-27 | Disposition: A | Discharge status: Home / Self Care | Payer: 59 | Source: Ambulatory Visit | Attending: Gastroenterology | Admitting: Gastroenterology

## 2017-10-27 DIAGNOSIS — R740 Nonspecific elevation of levels of transaminase and lactic acid dehydrogenase [LDH]: Secondary | ICD-10-CM | POA: Insufficient documentation

## 2017-10-27 DIAGNOSIS — R7401 Elevation of levels of liver transaminase levels: Secondary | ICD-10-CM

## 2017-10-28 ENCOUNTER — Other Ambulatory Visit: Payer: 59

## 2017-10-28 LAB — HEPATITIS PANEL, ACUTE
HEP B C IGM: NEGATIVE
HEP B S AG: NEGATIVE
Hep A IgM: NEGATIVE

## 2017-10-29 NOTE — Telephone Encounter (Signed)
I received a message back from his GI physician.  She stated it would be unlikely to be related to his liver.  We will need to consider obtaining additional lab work to evaluate for cause.  Thanks.

## 2017-10-29 NOTE — Telephone Encounter (Signed)
Noted.  The elevated LDH could potentially be related to his fatty liver.  I will send a message to his GI physician to see if she thinks this could be contributing.  Thanks.

## 2017-10-30 NOTE — Telephone Encounter (Signed)
Left message to return call 

## 2017-10-30 NOTE — Telephone Encounter (Signed)
Need patient to fax last lab results to [name], copy obtained was placed in scan and has not been scanned

## 2017-11-02 NOTE — Telephone Encounter (Signed)
Sent mychart message, unable to reach patient per phone

## 2017-11-05 ENCOUNTER — Other Ambulatory Visit: Payer: Self-pay | Admitting: Family Medicine

## 2017-11-10 NOTE — Discharge Instructions (Signed)
General Anesthesia, Adult, Care After °These instructions provide you with information about caring for yourself after your procedure. Your health care provider may also give you more specific instructions. Your treatment has been planned according to current medical practices, but problems sometimes occur. Call your health care provider if you have any problems or questions after your procedure. °What can I expect after the procedure? °After the procedure, it is common to have: °· Vomiting. °· A sore throat. °· Mental slowness. ° °It is common to feel: °· Nauseous. °· Cold or shivery. °· Sleepy. °· Tired. °· Sore or achy, even in parts of your body where you did not have surgery. ° °Follow these instructions at home: °For at least 24 hours after the procedure: °· Do not: °? Participate in activities where you could fall or become injured. °? Drive. °? Use heavy machinery. °? Drink alcohol. °? Take sleeping pills or medicines that cause drowsiness. °? Make important decisions or sign legal documents. °? Take care of children on your own. °· Rest. °Eating and drinking °· If you vomit, drink water, juice, or soup when you can drink without vomiting. °· Drink enough fluid to keep your urine clear or pale yellow. °· Make sure you have little or no nausea before eating solid foods. °· Follow the diet recommended by your health care provider. °General instructions °· Have a responsible adult stay with you until you are awake and alert. °· Return to your normal activities as told by your health care provider. Ask your health care provider what activities are safe for you. °· Take over-the-counter and prescription medicines only as told by your health care provider. °· If you smoke, do not smoke without supervision. °· Keep all follow-up visits as told by your health care provider. This is important. °Contact a health care provider if: °· You continue to have nausea or vomiting at home, and medicines are not helpful. °· You  cannot drink fluids or start eating again. °· You cannot urinate after 8-12 hours. °· You develop a skin rash. °· You have fever. °· You have increasing redness at the site of your procedure. °Get help right away if: °· You have difficulty breathing. °· You have chest pain. °· You have unexpected bleeding. °· You feel that you are having a life-threatening or urgent problem. °This information is not intended to replace advice given to you by your health care provider. Make sure you discuss any questions you have with your health care provider. °Document Released: 03/16/2001 Document Revised: 05/12/2016 Document Reviewed: 11/22/2015 °Elsevier Interactive Patient Education © 2018 [employer] ° °

## 2017-11-11 ENCOUNTER — Encounter
Admission: RE | Disposition: A | Discharge status: Home / Self Care | Payer: Self-pay | Source: Ambulatory Visit | Attending: Gastroenterology

## 2017-11-11 ENCOUNTER — Ambulatory Visit: Payer: 59 | Admitting: Anesthesiology

## 2017-11-11 ENCOUNTER — Ambulatory Visit
Admission: RE | Admit: 2017-11-11 | Discharge: 2017-11-11 | Disposition: A | Discharge status: Home / Self Care | Payer: 59 | Source: Ambulatory Visit | Attending: Gastroenterology | Admitting: Gastroenterology

## 2017-11-11 DIAGNOSIS — Z79899 Other long term (current) drug therapy: Secondary | ICD-10-CM | POA: Diagnosis not present

## 2017-11-11 DIAGNOSIS — K921 Melena: Secondary | ICD-10-CM | POA: Diagnosis not present

## 2017-11-11 DIAGNOSIS — N400 Enlarged prostate without lower urinary tract symptoms: Secondary | ICD-10-CM | POA: Diagnosis not present

## 2017-11-11 DIAGNOSIS — K644 Residual hemorrhoidal skin tags: Secondary | ICD-10-CM | POA: Insufficient documentation

## 2017-11-11 DIAGNOSIS — G4730 Sleep apnea, unspecified: Secondary | ICD-10-CM | POA: Diagnosis not present

## 2017-11-11 DIAGNOSIS — F419 Anxiety disorder, unspecified: Secondary | ICD-10-CM | POA: Insufficient documentation

## 2017-11-11 DIAGNOSIS — K625 Hemorrhage of anus and rectum: Secondary | ICD-10-CM | POA: Diagnosis not present

## 2017-11-11 DIAGNOSIS — F329 Major depressive disorder, single episode, unspecified: Secondary | ICD-10-CM | POA: Diagnosis not present

## 2017-11-11 DIAGNOSIS — K635 Polyp of colon: Secondary | ICD-10-CM | POA: Diagnosis not present

## 2017-11-11 DIAGNOSIS — I10 Essential (primary) hypertension: Secondary | ICD-10-CM | POA: Diagnosis not present

## 2017-11-11 DIAGNOSIS — Z888 Allergy status to other drugs, medicaments and biological substances status: Secondary | ICD-10-CM | POA: Diagnosis not present

## 2017-11-11 DIAGNOSIS — Z791 Long term (current) use of non-steroidal anti-inflammatories (NSAID): Secondary | ICD-10-CM | POA: Insufficient documentation

## 2017-11-11 DIAGNOSIS — D124 Benign neoplasm of descending colon: Secondary | ICD-10-CM | POA: Diagnosis not present

## 2017-11-11 HISTORY — DX: Brachial plexus disorders: G54.0

## 2017-11-11 HISTORY — PX: POLYPECTOMY: SHX5525

## 2017-11-11 HISTORY — PX: COLONOSCOPY WITH PROPOFOL: SHX5780

## 2017-11-11 SURGERY — COLONOSCOPY WITH PROPOFOL
Anesthesia: General | Wound class: Contaminated

## 2017-11-11 MED ORDER — PROPOFOL 10 MG/ML IV BOLUS
INTRAVENOUS | Status: DC | PRN
  Administered 2017-11-11 (×2): 50 mg via INTRAVENOUS
  Administered 2017-11-11: 100 mg via INTRAVENOUS
  Administered 2017-11-11 (×5): 50 mg via INTRAVENOUS

## 2017-11-11 MED ORDER — STERILE WATER FOR IRRIGATION IR SOLN
Status: DC | PRN
  Administered 2017-11-11: 11:00:00

## 2017-11-11 MED ORDER — LIDOCAINE HCL (CARDIAC) 20 MG/ML IV SOLN
INTRAVENOUS | Status: DC | PRN
  Administered 2017-11-11: 40 mg via INTRAVENOUS

## 2017-11-11 MED ORDER — LACTATED RINGERS IV SOLN
1000.0000 mL | INTRAVENOUS | Status: DC
  Administered 2017-11-11: 1000 mL via INTRAVENOUS

## 2017-11-11 MED ORDER — ACETAMINOPHEN 325 MG PO TABS: 325.0000 mg | ORAL_TABLET | Freq: Once | ORAL | Status: DC

## 2017-11-11 MED ORDER — LACTATED RINGERS IV SOLN: INTRAVENOUS | Status: DC

## 2017-11-11 MED ORDER — ACETAMINOPHEN 160 MG/5ML PO SOLN: 325.0000 mg | Freq: Once | ORAL | Status: DC

## 2017-11-11 SURGICAL SUPPLY — 23 items
CANISTER SUCT 1200ML W/VALVE (MISCELLANEOUS) ×4 IMPLANT
CLIP HMST 235XBRD CATH ROT (MISCELLANEOUS) IMPLANT
CLIP RESOLUTION 360 11X235 (MISCELLANEOUS)
FCP ESCP3.2XJMB 240X2.8X (MISCELLANEOUS)
FORCEPS BIOP RAD 4 LRG CAP 4 (CUTTING FORCEPS) IMPLANT
FORCEPS BIOP RJ4 240 W/NDL (MISCELLANEOUS)
FORCEPS ESCP3.2XJMB 240X2.8X (MISCELLANEOUS) IMPLANT
GOWN CVR UNV OPN BCK APRN NK (MISCELLANEOUS) ×4 IMPLANT
GOWN ISOL THUMB LOOP REG UNIV (MISCELLANEOUS) ×8
INJECTOR VARIJECT VIN23 (MISCELLANEOUS) IMPLANT
KIT DEFENDO VALVE AND CONN (KITS) IMPLANT
KIT ENDO PROCEDURE OLY (KITS) ×4 IMPLANT
MARKER SPOT ENDO TATTOO 5ML (MISCELLANEOUS) IMPLANT
PAD GROUND ADULT SPLIT (MISCELLANEOUS) IMPLANT
PROBE APC STR FIRE (PROBE) IMPLANT
RETRIEVER NET ROTH 2.5X230 LF (MISCELLANEOUS) IMPLANT
SNARE SHORT THROW 13M SML OVAL (MISCELLANEOUS) ×2 IMPLANT
SNARE SHORT THROW 30M LRG OVAL (MISCELLANEOUS) IMPLANT
SNARE SNG USE RND 15MM (INSTRUMENTS) IMPLANT
SPOT EX ENDOSCOPIC TATTOO (MISCELLANEOUS)
TRAP ETRAP POLY (MISCELLANEOUS) ×2 IMPLANT
VARIJECT INJECTOR VIN23 (MISCELLANEOUS)
WATER STERILE IRR 250ML POUR (IV SOLUTION) ×4 IMPLANT

## 2017-11-11 NOTE — Anesthesia Postprocedure Evaluation (Signed)
Anesthesia Post Note  Patient: Daniel Calderon  Procedure(s) Performed: COLONOSCOPY WITH PROPOFOL (N/A ) POLYPECTOMY  Patient location during evaluation: PACU Anesthesia Type: General Level of consciousness: awake and alert and oriented Pain management: satisfactory to patient Vital Signs Assessment: post-procedure vital signs reviewed and stable Respiratory status: spontaneous breathing, nonlabored ventilation and respiratory function stable Cardiovascular status: blood pressure returned to baseline and stable Postop Assessment: Adequate PO intake and No signs of nausea or vomiting Anesthetic complications: no    Raliegh Ip

## 2017-11-11 NOTE — Anesthesia Preprocedure Evaluation (Signed)
Anesthesia Evaluation  Patient identified by MRN, date of birth, ID band Patient awake    Reviewed: Allergy & Precautions, H&P , NPO status , Patient's Chart, lab work & pertinent test results  Airway Mallampati: II  TM Distance: >3 FB Neck ROM: full    Dental no notable dental hx.    Pulmonary sleep apnea ,    Pulmonary exam normal breath sounds clear to auscultation       Cardiovascular hypertension, Normal cardiovascular exam Rhythm:regular Rate:Normal     Neuro/Psych PSYCHIATRIC DISORDERS    GI/Hepatic   Endo/Other    Renal/GU      Musculoskeletal   Abdominal   Peds  Hematology   Anesthesia Other Findings   Reproductive/Obstetrics                             Anesthesia Physical Anesthesia Plan  ASA: II  Anesthesia Plan: General   Post-op Pain Management:    Induction: Intravenous  PONV Risk Score and Plan: 2 and Propofol infusion  Airway Management Planned: Natural Airway  Additional Equipment:   Intra-op Plan:   Post-operative Plan:   Informed Consent: I have reviewed the patients History and Physical, chart, labs and discussed the procedure including the risks, benefits and alternatives for the proposed anesthesia with the patient or authorized representative who has indicated his/her understanding and acceptance.     Plan Discussed with: CRNA  Anesthesia Plan Comments:         Anesthesia Quick Evaluation

## 2017-11-11 NOTE — Transfer of Care (Signed)
Immediate Anesthesia Transfer of Care Note  Patient: Daniel Calderon  Procedure(s) Performed: COLONOSCOPY WITH PROPOFOL (N/A ) POLYPECTOMY  Patient Location: PACU  Anesthesia Type: General  Level of Consciousness: awake, alert  and patient cooperative  Airway and Oxygen Therapy: Patient Spontanous Breathing and Patient connected to supplemental oxygen  Post-op Assessment: Post-op Vital signs reviewed, Patient's Cardiovascular Status Stable, Respiratory Function Stable, Patent Airway and No signs of Nausea or vomiting  Post-op Vital Signs: Reviewed and stable  Complications: No apparent anesthesia complications

## 2017-11-11 NOTE — Anesthesia Procedure Notes (Signed)
Procedure Name: MAC Date/Time: 11/11/2017 10:28 AM Performed by: Janna Arch, CRNA Pre-anesthesia Checklist: Patient identified, Emergency Drugs available, Suction available and Patient being monitored Patient Re-evaluated:Patient Re-evaluated prior to induction Oxygen Delivery Method: Nasal cannula

## 2017-11-11 NOTE — H&P (Signed)
Daniel Darby, MD [address]  [place]  [place], [place] [address]  Main: [phone]  Fax: [phone] Pager: [phone]  Primary Care Physician:  [name], MD Primary Gastroenterologist:  Dr. Cephas Calderon  Pre-Procedure History & Physical: HPI:  Daniel Calderon is a 51 y.o. male is here for an colonoscopy.   Past Medical History:  Diagnosis Date  . Allergy    Seasonal  . Anxiety   . Benign enlargement of prostate   . Depression   . Elevated BP   . Elevated transaminase level   . Failure of erection   . Fatigue   . Frequent headaches   . Hypogonadism in male   . Kidney stones    History of kidney stones  . Migraine   . Sleep apnea    Currently uses cpap  . Thoracic outlet syndrome    Left Arm    Past Surgical History:  Procedure Laterality Date  . Left Shoulder Surgery  2011  . nerve block     2 in neck. 5 in back.  . NOSE SURGERY    . SCALENE NODE BIOPSY / EXCISION    . VASECTOMY      Prior to Admission medications   Medication Sig Start Date End Date Taking? Authorizing Provider  amLODipine (NORVASC) 10 MG tablet Take 1 tablet (10 mg total) by mouth daily. 09/01/17 11/30/17  [name], MD  fexofenadine-pseudoephedrine (ALLEGRA-D) 60-120 MG 12 hr tablet Take 1 tablet by mouth 2 (two) times daily. 05/01/17   Sable Feil, PA-C  fluticasone (FLONASE) 50 MCG/ACT nasal spray Place 2 sprays into both nostrils daily. 07/16/15   Rubbie Battiest, RN  ibuprofen (ADVIL,MOTRIN) 200 MG tablet Take 200 mg by mouth as needed.    [provider]  Multiple Vitamin (MULTIVITAMIN) tablet Take 1 tablet by mouth daily.    [provider]  omeprazole (PRILOSEC) 20 MG capsule TAKE 1 CAPSULE BY MOUTH DAILY 11/05/17   [name], MD  sertraline (ZOLOFT) 100 MG tablet TAKE 1 TABLET BY MOUTH ONCE DAILY 10/28/17   [name], MD    Allergies as of 10/16/2017 - Review Complete 10/16/2017  Allergen Reaction Noted  .  Erythromycin Nausea And Vomiting 03/24/2015  . Cephalexin  05/07/2015  . Dexamethasone  05/07/2015  . Oxycodone-acetaminophen  05/07/2015  . Oxycodone-acetaminophen  05/16/2016  . Prednisone  05/07/2015  . Gabapentin Other (See Comments) 06/26/2015  . Ibuprofen Itching 03/24/2015    Family History  Problem Relation Age of Onset  . Hypertension Mother        Living  . Hearing loss Mother   . Heart disease Other   . Healthy Father        Living  . Diabetes Brother   . Stroke Paternal Uncle   . Heart attack Paternal Uncle   . Hearing loss Maternal Grandmother   . Healthy Son   . Healthy Daughter   . Kidney cancer Neg Hx   . Bladder Cancer Neg Hx   . Prostate cancer Neg Hx     Social History   Socioeconomic History  . Marital status: Married    Spouse name: Not on file  . Number of children: Not on file  . Years of education: Not on file  . Highest education level: Not on file  Social Needs  . Financial resource strain: Not on file  . Food insecurity - worry: Not on file  . Food insecurity -  inability: Not on file  . Transportation needs - medical: Not on file  . Transportation needs - non-medical: Not on file  Occupational History  . Not on file  Tobacco Use  . Smoking status: Never Smoker  . Smokeless tobacco: Never Used  Substance and Sexual Activity  . Alcohol use: Yes    Alcohol/week: 1.8 oz    Types: 3 Cans of beer per week  . Drug use: No  . Sexual activity: Yes    Partners: Female    Comment: Wife  Other Topics Concern  . Not on file  Social History Narrative   [employer]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    Lives with wife and daughter (29)   High school and tech school   Caffeine- 2-3 coffee    Enjoys- yard work, Location manager- 2 dogs     Review of Systems: See HPI, otherwise negative ROS  Physical Exam: Ht 6\' 2"  (1.88 m)   Wt 200 lb (90.7 kg)   BMI 25.68 kg/m  General:   Alert,  pleasant and cooperative in NAD Head:  Normocephalic and  atraumatic. Neck:  Supple; no masses or thyromegaly. Lungs:  Clear throughout to auscultation.    Heart:  Regular rate and rhythm. Abdomen:  Soft, nontender and nondistended. Normal bowel sounds, without guarding, and without rebound.   Neurologic:  Alert and  oriented x4;  grossly normal neurologically.  Impression/Plan: Daniel Calderon is here for an colonoscopy to be performed for rectal bleeding  Risks, benefits, limitations, and alternatives regarding  colonoscopy have been reviewed with the patient.  Questions have been answered.  All parties agreeable.   Sherri Sear, MD  11/11/2017, 9:11 AM

## 2017-11-11 NOTE — Op Note (Signed)
[facility] Patient Name: Daniel Calderon Procedure Date: 11/11/2017 10:16 AM MRN: [id number] Account #: [id number] Date of Birth: [birth year] Admit Type: Outpatient Age: 51 Room: [facility] OR ROOM 01 Gender: Male Note Status: Finalized Procedure:            Colonoscopy Indications:          Rectal bleeding Providers:            Lin Landsman MD, MD Referring MD:         Angela Adam. Caryl Bis (Referring MD) Medicines:            Monitored Anesthesia Care Complications:        No immediate complications. Estimated blood loss: None. Procedure:            Pre-Anesthesia Assessment:                       - Prior to the procedure, a History and Physical was                        performed, and patient medications and allergies were                        reviewed. The patient is competent. The risks and                        benefits of the procedure and the sedation options and                        risks were discussed with the patient. All questions                        were answered and informed consent was obtained.                        Patient identification and proposed procedure were                        verified by the physician, the nurse, the                        anesthesiologist, the anesthetist and the technician in                        the pre-procedure area in the procedure room. Mental                        Status Examination: alert and oriented. Airway                        Examination: normal oropharyngeal airway and neck                        mobility. Respiratory Examination: clear to                        auscultation. CV Examination: normal. Prophylactic                        Antibiotics: The patient does not require prophylactic  antibiotics. Prior Anticoagulants: The patient has                        taken no previous anticoagulant or antiplatelet agents.                        ASA Grade  Assessment: II - A patient with mild systemic                        disease. After reviewing the risks and benefits, the                        patient was deemed in satisfactory condition to undergo                        the procedure. The anesthesia plan was to use monitored                        anesthesia care (MAC). Immediately prior to                        administration of medications, the patient was                        re-assessed for adequacy to receive sedatives. The                        heart rate, respiratory rate, oxygen saturations, blood                        pressure, adequacy of pulmonary ventilation, and                        response to care were monitored throughout the                        procedure. The physical status of the patient was                        re-assessed after the procedure.                       After obtaining informed consent, the colonoscope was                        passed under direct vision. Throughout the procedure,                        the patient's blood pressure, pulse, and oxygen                        saturations were monitored continuously. The Olympus                        Colonoscope 190 [id number]) was introduced through the                        anus and advanced to the the terminal ileum. The  colonoscopy was performed without difficulty. The                        patient tolerated the procedure well. The quality of                        the bowel preparation was evaluated using the BBPS                        [facility] Bowel Preparation Scale) with scores of: Right                        Colon = 3, Transverse Colon = 3 and Left Colon = 3                        (entire mucosa seen well with no residual staining,                        small fragments of stool or opaque liquid). The total                        BBPS score equals 9. Findings:      The perianal exam findings include  non-thrombosed external hemorrhoids.      The terminal ileum appeared normal.      Normal mucosa was found in the entire colon.      External hemorrhoids were found during retroflexion. The hemorrhoids       were medium-sized.      A 7 mm polyp was found in the descending colon. The polyp was sessile.       The polyp was removed with a cold snare. Resection and retrieval were       complete. Impression:           - Non-thrombosed external hemorrhoids found on perianal                        exam.                       - The examined portion of the ileum was normal.                       - Normal mucosa in the entire examined colon.                       - External hemorrhoids.                       - No specimens collected. Recommendation:       - Discharge patient to home (with spouse).                       - Resume previous diet today.                       - Continue present medications.                       - Repeat colonoscopy in 5 years for surveillance.                       -  Await pathology results. Procedure Code(s):    --- Professional ---                       [phone], Colonoscopy, flexible; with removal of tumor(s),                        polyp(s), or other lesion(s) by snare technique Diagnosis Code(s):    --- Professional ---                       K64.4, Residual hemorrhoidal skin tags                       K62.5, Hemorrhage of anus and rectum CPT copyright 2016 American Medical Association. All rights reserved. The codes documented in this report are preliminary and upon coder review may  be revised to meet current compliance requirements. Dr. Ulyess Mort Lin Landsman MD, MD 11/11/2017 10:53:32 AM This report has been signed electronically. Number of Addenda: 0 Note Initiated On: 11/11/2017 10:16 AM Scope Withdrawal Time: 0 hours 10 minutes 19 seconds  Total Procedure Duration: 0 hours 14 minutes 11 seconds       [facility]

## 2017-11-13 ENCOUNTER — Encounter: Payer: Self-pay | Admitting: Gastroenterology

## 2017-11-17 ENCOUNTER — Encounter: Payer: Self-pay | Admitting: Gastroenterology

## 2017-11-18 ENCOUNTER — Encounter: Payer: Self-pay | Admitting: Gastroenterology

## 2017-12-01 ENCOUNTER — Ambulatory Visit: Payer: Self-pay | Admitting: Family Medicine

## 2017-12-28 ENCOUNTER — Other Ambulatory Visit: Payer: Self-pay

## 2017-12-28 ENCOUNTER — Ambulatory Visit (INDEPENDENT_AMBULATORY_CARE_PROVIDER_SITE_OTHER): Payer: No Typology Code available for payment source | Admitting: Family Medicine

## 2017-12-28 ENCOUNTER — Encounter: Payer: Self-pay | Admitting: Family Medicine

## 2017-12-28 VITALS — BP 140/88 | HR 85 | Temp 98.0°F | Wt 212.2 lb

## 2017-12-28 DIAGNOSIS — R945 Abnormal results of liver function studies: Secondary | ICD-10-CM

## 2017-12-28 DIAGNOSIS — G4730 Sleep apnea, unspecified: Secondary | ICD-10-CM

## 2017-12-28 DIAGNOSIS — F4323 Adjustment disorder with mixed anxiety and depressed mood: Secondary | ICD-10-CM | POA: Diagnosis not present

## 2017-12-28 DIAGNOSIS — R7989 Other specified abnormal findings of blood chemistry: Secondary | ICD-10-CM

## 2017-12-28 DIAGNOSIS — D649 Anemia, unspecified: Secondary | ICD-10-CM | POA: Diagnosis not present

## 2017-12-28 DIAGNOSIS — E291 Testicular hypofunction: Secondary | ICD-10-CM | POA: Diagnosis not present

## 2017-12-28 DIAGNOSIS — R5382 Chronic fatigue, unspecified: Secondary | ICD-10-CM

## 2017-12-28 DIAGNOSIS — I10 Essential (primary) hypertension: Secondary | ICD-10-CM | POA: Diagnosis not present

## 2017-12-28 NOTE — Assessment & Plan Note (Addendum)
He will continue to follow with urology. 

## 2017-12-28 NOTE — Patient Instructions (Signed)
Nice to see you. Please follow-up with urology as planned. We will check lab work today and contact you with the results.

## 2017-12-28 NOTE — Progress Notes (Signed)
Tommi Rumps, MD Phone: [phone]  Daniel Calderon is a 52 y.o. male who presents today for follow-up.  Hypertension: Typically 140/80.  Taking amlodipine.  No chest pain, shortness of breath, or edema.  Anxiety/depression: He notes no symptoms.  He is on Zoloft.  No SI.  He does have issues with chronic fatigue.  He notes this has been evaluated extensively in the past with multiple specialists including neurology.  He does have sleep apnea and uses CPAP daily.  Notes he wakes up well rested.  Notes this was titrated relatively recently.  Notes his testosterone has been low and he has been on testosterone supplements in the past.  He notes he wakes up mostly well rested.  Notes he has been taking an iron supplement as well as magnesium and B12.  Notes he has felt better since taking those things.  Social History   Tobacco Use  Smoking Status Never Smoker  Smokeless Tobacco Never Used     ROS see history of present illness  Objective  Physical Exam Vitals:   12/28/17 1320  BP: 140/88  Pulse: 85  Temp: 98 F (36.7 C)  SpO2: 98%    BP Readings from Last 3 Encounters:  12/28/17 140/88  11/11/17 107/73  10/16/17 133/87   Wt Readings from Last 3 Encounters:  12/28/17 212 lb 3.2 oz (96.3 kg)  11/11/17 202 lb (91.6 kg)  10/16/17 212 lb 3.2 oz (96.3 kg)    Physical Exam  Constitutional: No distress.  Cardiovascular: Normal rate, regular rhythm and normal heart sounds.  Pulmonary/Chest: Effort normal and breath sounds normal.  Musculoskeletal: He exhibits no edema.  Neurological: He is alert. Gait normal.  Skin: Skin is warm and dry. He is not diaphoretic.     Assessment/Plan: Please see individual problem list.  Hypertension Has been relatively well controlled.  He will continue to monitor.  Continue current medication.  Sleep apnea Doing well with his CPAP.  He will continue this.  Wears it for 7-8 hours a night.  Hypogonadism in male He will continue  to follow with urology.  Adjustment disorder with mixed anxiety and depressed mood Well-controlled.  Continue current regimen.  Fatigue Patient continues to have issues with fairly significant fatigue.  His sleep apnea appears to be well controlled.  He does not have depression.  No cardiac cause.  Previous evaluation apparently unremarkable.  It does not appear that a cortisol has been checked previously.  We will have him get this checked tomorrow early in the morning at the lab at the hospital.  He will otherwise continue to monitor.   Kobey was seen today for follow-up.  Diagnoses and all orders for this visit:  Elevated LFTs -     Cancel: Hepatic function panel -     Comp Met (CMET); Future  Anemia, unspecified type -     Cancel: Iron, TIBC and Ferritin Panel -     Cancel: CBC w/Diff -     Cancel: B12 -     CBC w/Diff; Future -     B12; Future -     Iron and TIBC; Future -     Ferritin; Future  Chronic fatigue -     Cortisol; Future  Low testosterone in male -     Ambulatory referral to Urology  Essential hypertension  Sleep apnea, unspecified type  Hypogonadism in male  Adjustment disorder with mixed anxiety and depressed mood    Orders Placed This Encounter  Procedures  .  Cortisol    Standing Status:   Future    Standing Expiration Date:   12/28/2018  . Comp Met (CMET)    Standing Status:   Future    Standing Expiration Date:   12/28/2018  . CBC w/Diff    Standing Status:   Future    Standing Expiration Date:   12/28/2018  . B12    Standing Status:   Future    Standing Expiration Date:   12/28/2018  . Iron and TIBC    Standing Status:   Future    Standing Expiration Date:   12/28/2018  . Ferritin    Standing Status:   Future    Standing Expiration Date:   12/28/2018  . Ambulatory referral to Urology    Referral Priority:   Routine    Referral Type:   Consultation    Referral Reason:   Specialty Services Required    Requested Specialty:   Urology    Number  of Visits Requested:   1    No orders of the defined types were placed in this encounter.    Tommi Rumps, MD [place]

## 2017-12-28 NOTE — Assessment & Plan Note (Signed)
Well-controlled.  Continue current regimen. 

## 2017-12-28 NOTE — Assessment & Plan Note (Signed)
Doing well with his CPAP.  He will continue this.  Wears it for 7-8 hours a night.

## 2017-12-28 NOTE — Assessment & Plan Note (Signed)
Patient continues to have issues with fairly significant fatigue.  His sleep apnea appears to be well controlled.  He does not have depression.  No cardiac cause.  Previous evaluation apparently unremarkable.  It does not appear that a cortisol has been checked previously.  We will have him get this checked tomorrow early in the morning at the lab at the hospital.  He will otherwise continue to monitor.

## 2017-12-28 NOTE — Assessment & Plan Note (Addendum)
Has been relatively well controlled.  He will continue to monitor.  Continue current medication.

## 2017-12-30 ENCOUNTER — Other Ambulatory Visit
Admission: RE | Admit: 2017-12-30 | Discharge: 2017-12-30 | Disposition: A | Discharge status: Home / Self Care | Payer: No Typology Code available for payment source | Source: Ambulatory Visit | Attending: Family Medicine | Admitting: Family Medicine

## 2017-12-30 DIAGNOSIS — R945 Abnormal results of liver function studies: Secondary | ICD-10-CM | POA: Diagnosis present

## 2017-12-30 DIAGNOSIS — D649 Anemia, unspecified: Secondary | ICD-10-CM | POA: Insufficient documentation

## 2017-12-30 DIAGNOSIS — R5382 Chronic fatigue, unspecified: Secondary | ICD-10-CM | POA: Insufficient documentation

## 2017-12-30 DIAGNOSIS — R7989 Other specified abnormal findings of blood chemistry: Secondary | ICD-10-CM

## 2017-12-30 LAB — CBC WITH DIFFERENTIAL/PLATELET
BASOS PCT: 1 %
Basophils Absolute: 0.1 10*3/uL (ref 0–0.1)
Eosinophils Absolute: 0.4 10*3/uL (ref 0–0.7)
Eosinophils Relative: 6 %
HEMATOCRIT: 43 % (ref 40.0–52.0)
HEMOGLOBIN: 14.5 g/dL (ref 13.0–18.0)
LYMPHS PCT: 19 %
Lymphs Abs: 1.3 10*3/uL (ref 1.0–3.6)
MCH: 29.3 pg (ref 26.0–34.0)
MCHC: 33.8 g/dL (ref 32.0–36.0)
MCV: 86.7 fL (ref 80.0–100.0)
MONO ABS: 0.5 10*3/uL (ref 0.2–1.0)
MONOS PCT: 8 %
NEUTROS ABS: 4.5 10*3/uL (ref 1.4–6.5)
NEUTROS PCT: 66 %
Platelets: 218 10*3/uL (ref 150–440)
RBC: 4.96 MIL/uL (ref 4.40–5.90)
RDW: 14.5 % (ref 11.5–14.5)
WBC: 6.8 10*3/uL (ref 3.8–10.6)

## 2017-12-30 LAB — IRON AND TIBC
IRON: 100 ug/dL (ref 45–182)
Saturation Ratios: 20 % (ref 17.9–39.5)
TIBC: 513 ug/dL — ABNORMAL HIGH (ref 250–450)
UIBC: 413 ug/dL

## 2017-12-30 LAB — COMPREHENSIVE METABOLIC PANEL
ALBUMIN: 4.8 g/dL (ref 3.5–5.0)
ALK PHOS: 95 U/L (ref 38–126)
ALT: 107 U/L — ABNORMAL HIGH (ref 17–63)
ANION GAP: 10 (ref 5–15)
AST: 69 U/L — AB (ref 15–41)
BILIRUBIN TOTAL: 0.8 mg/dL (ref 0.3–1.2)
BUN: 21 mg/dL — AB (ref 6–20)
CALCIUM: 9.6 mg/dL (ref 8.9–10.3)
CO2: 23 mmol/L (ref 22–32)
Chloride: 105 mmol/L (ref 101–111)
Creatinine, Ser: 1.13 mg/dL (ref 0.61–1.24)
GFR calc Af Amer: >60 mL/min (ref >60)
GLUCOSE: 88 mg/dL (ref 65–99)
Potassium: 4.1 mmol/L (ref 3.5–5.1)
Sodium: 138 mmol/L (ref 135–145)
TOTAL PROTEIN: 7.7 g/dL (ref 6.5–8.1)

## 2017-12-30 LAB — FERRITIN: FERRITIN: 24 ng/mL (ref 24–336)

## 2017-12-30 LAB — CORTISOL: CORTISOL PLASMA: 10.3 ug/dL

## 2017-12-30 LAB — VITAMIN B12: Vitamin B-12: 939 pg/mL — ABNORMAL HIGH (ref 180–914)

## 2018-01-02 ENCOUNTER — Other Ambulatory Visit: Payer: Self-pay | Admitting: Family Medicine

## 2018-01-02 DIAGNOSIS — E2749 Other adrenocortical insufficiency: Secondary | ICD-10-CM

## 2018-01-05 ENCOUNTER — Ambulatory Visit: Payer: No Typology Code available for payment source | Admitting: Psychology

## 2018-01-05 DIAGNOSIS — R413 Other amnesia: Secondary | ICD-10-CM | POA: Diagnosis not present

## 2018-01-05 DIAGNOSIS — F809 Developmental disorder of speech and language, unspecified: Secondary | ICD-10-CM
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
## 2018-01-06 ENCOUNTER — Ambulatory Visit (INDEPENDENT_AMBULATORY_CARE_PROVIDER_SITE_OTHER): Payer: No Typology Code available for payment source | Admitting: Psychology

## 2018-01-06 ENCOUNTER — Encounter: Payer: Self-pay | Admitting: Psychology

## 2018-01-06 DIAGNOSIS — R413 Other amnesia: Secondary | ICD-10-CM

## 2018-01-06 DIAGNOSIS — F809 Developmental disorder of speech and language, unspecified: Secondary | ICD-10-CM

## 2018-01-06 NOTE — Progress Notes (Signed)
   Neuropsychology Note  Daniel Calderon completed 180 minutes of neuropsychological testing with technician, Milana Kidney, BS, under the supervision of Dr. Macarthur Critchley, Licensed Psychologist. The patient did not appear overtly distressed by the testing session but did complain of a headache at the beginning, per behavioral observation or via self-report to the technician. Rest breaks were offered.   Clinical Decision Making: In considering the patient's current level of functioning, level of presumed impairment, nature of symptoms, emotional and behavioral responses during the interview, level of literacy, and observed level of motivation/effort, a battery of tests was selected and communicated to the psychometrician.  Communication between the psychologist and technician was ongoing throughout the testing session and changes were made as deemed necessary based on patient performance on testing, technician observations and additional pertinent factors such as those listed above.  Daniel Calderon will return within approximately 2 weeks for an interactive feedback session with Dr. Si Raider at which time his test performances, clinical impressions and treatment recommendations will be reviewed in detail. The patient understands he can contact our office should he require our assistance before this time.  35 minutes spent performing neuropsychological evaluation services/clinical decision making (psychologist). [CPT 98921] 180 minutes spent face-to-face with patient administering standardized tests, 60 minutes spent scoring (technician). [CPT Y8200648, [id number] units]  Full report to follow.

## 2018-01-07 ENCOUNTER — Encounter: Payer: Self-pay | Admitting: Psychology

## 2018-01-08 ENCOUNTER — Encounter: Payer: Self-pay | Admitting: Urology

## 2018-01-08 ENCOUNTER — Ambulatory Visit: Payer: No Typology Code available for payment source | Admitting: Urology

## 2018-01-08 VITALS — BP 135/83 | HR 83 | Ht 74.0 in | Wt 216.0 lb

## 2018-01-08 DIAGNOSIS — R7989 Other specified abnormal findings of blood chemistry: Secondary | ICD-10-CM | POA: Diagnosis not present

## 2018-01-08 DIAGNOSIS — N529 Male erectile dysfunction, unspecified: Secondary | ICD-10-CM

## 2018-01-08 MED ORDER — TESTOSTERONE 50 MG/5GM (1%) TD GEL: 5.0000 g | Freq: Every day | TRANSDERMAL | 3 refills | Status: DC

## 2018-01-08 NOTE — Progress Notes (Signed)
01/08/2018 2:32 PM   [name] [birth year] [id number]  Referring provider: [name], MD [address] [place], [place] [address]  Chief Complaint  Patient presents with  . Other    low testosterone    HPI: Patient is a 52 year old Caucasian male with testosterone deficiency, erectile dysfunction and BPH with LUTS who presents today for a follow up.   1.  Low testosterone Patient is experiencing a decrease in libido, a lack of energy, a decrease in strength, a decreased enjoyment in life, sadness and/or grumpiness, erections being less strong, a recent deterioration in an ability to play sports, falling asleep after dinner and a recent deterioration in their work performance.   He is not having spontaneous erections at night.  He has sleep apnea and is sleeping with a CPAP machine.   His most recent testosterone level was 218 in October 2018.  He was originally onTestopel insertion.  His last insertion was 09/18/2016 with 6 pellets.  He is insurance company stopped paying for testopel.  He has switched to Androgel 2 pumps daily.  He stopped using this medication.  Is unsure if it works for him.  He has also tried in the past injection therapy intramuscular.  During initial injections though he got severe steroid-induced rage.  He also got close to hurting himself.  This lasted for a few days after initial injection.  Then decreased that the shot wore out.  He is very hesitant to start injection therapy again due to this.    2.  BPH The patient was started on Myrbetriq for his urinary frequency at his last visit.  He is no longer on this medication.  He is not terribly bothered by this.  3.  Erectile dysfunction The patient has tried Viagra and Cialis.  He cannot tolerate the side effects of this medication.  He is not interested in trying other oral medications at this time  PMH: Past Medical History:  Diagnosis Date  . Allergy    Seasonal  . Anxiety   .  Benign enlargement of prostate   . Depression   . Elevated BP   . Elevated transaminase level   . Failure of erection   . Fatigue   . Frequent headaches   . Hypogonadism in male   . Kidney stones    History of kidney stones  . Migraine   . Sleep apnea    Currently uses cpap  . Thoracic outlet syndrome    Left Arm    Surgical History: Past Surgical History:  Procedure Laterality Date  . COLONOSCOPY WITH PROPOFOL N/A 11/11/2017   Procedure: COLONOSCOPY WITH PROPOFOL;  Surgeon: Lin Landsman, MD;  Location: [facility];  Service: Endoscopy;  Laterality: N/A;  . Left Shoulder Surgery  2011  . nerve block     2 in neck. 5 in back.  . NOSE SURGERY    . POLYPECTOMY  11/11/2017   Procedure: POLYPECTOMY;  Surgeon: Lin Landsman, MD;  Location: [facility];  Service: Endoscopy;;  . SCALENE NODE BIOPSY / EXCISION    . VASECTOMY      Home Medications:  Allergies as of 01/08/2018      Reactions   Erythromycin Nausea And Vomiting   Cephalexin    Other reaction(s): Other (See Comments) Other Reaction: Other reaction   Dexamethasone    Other reaction(s): Other (See Comments) Other Reaction: Other reaction   Oxycodone-acetaminophen    Other reaction(s): Other (  See Comments) Other Reaction: Other reaction   Oxycodone-acetaminophen    Other reaction(s): Other (See Comments) Other Reaction: Other reaction   Prednisone    Other reaction(s): Other (See Comments) Other Reaction: Other reaction   Gabapentin Other (See Comments)   Aggressive   Ibuprofen Itching   Can take in low doses per pt      Medication List        Accurate as of 01/08/18  2:32 PM. Always use your most recent med list.          amLODipine 10 MG tablet Commonly known as:  NORVASC Take 1 tablet (10 mg total) by mouth daily.   fexofenadine-pseudoephedrine 60-120 MG 12 hr tablet Commonly known as:  ALLEGRA-D Take 1 tablet by mouth 2 (two) times daily.   fluticasone 50 MCG/ACT  nasal spray Commonly known as:  FLONASE Place 2 sprays into both nostrils daily.   ibuprofen 200 MG tablet Commonly known as:  ADVIL,MOTRIN Take 200 mg by mouth as needed.   IRON (FERROUS SULFATE) PO Take by mouth.   magnesium oxide 400 MG tablet Commonly known as:  MAG-OX Take 400 mg by mouth daily.   multivitamin tablet Take 1 tablet by mouth daily.   omeprazole 20 MG capsule Commonly known as:  PRILOSEC TAKE 1 CAPSULE BY MOUTH DAILY   sertraline 100 MG tablet Commonly known as:  ZOLOFT TAKE 1 TABLET BY MOUTH ONCE DAILY   testosterone 50 MG/5GM (1%) Gel Commonly known as:  ANDROGEL Place 5 g onto the skin daily.   VITAMIN B 12 PO Take by mouth.       Allergies:  Allergies  Allergen Reactions  . Erythromycin Nausea And Vomiting  . Cephalexin     Other reaction(s): Other (See Comments) Other Reaction: Other reaction  . Dexamethasone     Other reaction(s): Other (See Comments) Other Reaction: Other reaction  . Oxycodone-Acetaminophen     Other reaction(s): Other (See Comments) Other Reaction: Other reaction  . Oxycodone-Acetaminophen     Other reaction(s): Other (See Comments) Other Reaction: Other reaction  . Prednisone     Other reaction(s): Other (See Comments) Other Reaction: Other reaction  . Gabapentin Other (See Comments)    Aggressive  . Ibuprofen Itching    Can take in low doses per pt    Family History: Family History  Problem Relation Age of Onset  . Hypertension Mother        Living  . Hearing loss Mother   . Heart disease Other   . Healthy Father        Living  . Diabetes Brother   . Stroke Paternal Uncle   . Heart attack Paternal Uncle   . Hearing loss Maternal Grandmother   . Healthy Son   . Healthy Daughter   . Kidney cancer Neg Hx   . Bladder Cancer Neg Hx   . Prostate cancer Neg Hx     Social History:  reports that  has never smoked. he has never used smokeless tobacco. He reports that he drinks about 1.8 oz of alcohol  per week. He reports that he does not use drugs.  ROS: UROLOGY Frequent Urination?: Yes Hard to postpone urination?: No Burning/pain with urination?: No Get up at night to urinate?: No Leakage of urine?: No Urine stream starts and stops?: No Trouble starting stream?: No Do you have to strain to urinate?: No Blood in urine?: No Urinary tract infection?: No Sexually transmitted disease?: No Injury to kidneys or bladder?:  No Painful intercourse?: No Weak stream?: No Erection problems?: Yes Penile pain?: No  Gastrointestinal Vomiting?: No Indigestion/heartburn?: No Diarrhea?: No Constipation?: No  Constitutional Fever: No Night sweats?: Yes Weight loss?: No Fatigue?: No  Skin Skin rash/lesions?: No Itching?: Yes  Eyes Blurred vision?: No Double vision?: No  Ears/Nose/Throat Sore throat?: No Sinus problems?: No  Hematologic/Lymphatic Swollen glands?: No Easy bruising?: No  Cardiovascular Leg swelling?: No Chest pain?: No  Respiratory Cough?: No Shortness of breath?: Yes  Endocrine Excessive thirst?: No  Musculoskeletal Back pain?: Yes Joint pain?: Yes  Neurological Headaches?: No Dizziness?: No  Psychologic Depression?: No Anxiety?: No  Physical Exam: BP 135/83   Pulse 83   Ht 6\' 2"  (1.88 m)   Wt 216 lb (98 kg)   BMI 27.73 kg/m   Constitutional:  Alert and oriented, No acute distress. HEENT: [place] AT, moist mucus membranes.  Trachea midline, no masses. Cardiovascular: No clubbing, cyanosis, or edema. Respiratory: Normal respiratory effort, no increased work of breathing. GI: Abdomen is soft, nontender, nondistended, no abdominal masses GU: No CVA tenderness.  Skin: No rashes, bruises or suspicious lesions. Lymph: No cervical or inguinal adenopathy. Neurologic: Grossly intact, no focal deficits, moving all 4 extremities. Psychiatric: Normal mood and affect.  Laboratory Data: Lab Results  Component Value Date   WBC 6.8 12/30/2017    HGB 14.5 12/30/2017   HCT 43.0 12/30/2017   MCV 86.7 12/30/2017   PLT 218 12/30/2017    Lab Results  Component Value Date   CREATININE 1.13 12/30/2017    No results found for: PSA  Lab Results  Component Value Date   TESTOSTERONE 218 (L) 09/28/2017    Lab Results  Component Value Date   HGBA1C 5.3 02/20/2017    Urinalysis    Component Value Date/Time   COLORURINE YELLOW (A) 02/19/2017 1020   APPEARANCEUR CLEAR (A) 02/19/2017 1020   LABSPEC 1.018 02/19/2017 1020   PHURINE 6.0 02/19/2017 1020   GLUCOSEU NEGATIVE 02/19/2017 1020   HGBUR NEGATIVE 02/19/2017 1020   BILIRUBINUR NEGATIVE 02/19/2017 1020   KETONESUR NEGATIVE 02/19/2017 1020   PROTEINUR NEGATIVE 02/19/2017 1020   NITRITE NEGATIVE 02/19/2017 1020   LEUKOCYTESUR NEGATIVE 02/19/2017 1020    Assessment & Plan:   1.  Low testosterone We will start the patient on AndroGel  50mg /5gm (1%).  He will apply 5 g split between both shoulders.  Does appear that his insurance will pay for this with a prior authorization.  As stated above, the patient is not a candidate for injection therapy as he has failed this in the past with high risk side effects.  He will follow-up in 1 month with labs prior.  2.  Erectile dysfunction We discussed other treatments outside of oral phosphodiesterase inhibitors.  These include the vacuum erection device, MUSE, and penile injection therapy.  He is not interested in trying this at this time.  We will readdress potentially penile injection therapy if his testosterone correction does not help his ED per  There are no diagnoses linked to this encounter.  Return in about 4 weeks (around 02/05/2018) for labs prior.  Nickie Retort, MD  [facility] [address] [place], [place] [address] [phone]
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
## 2018-01-14 ENCOUNTER — Ambulatory Visit (INDEPENDENT_AMBULATORY_CARE_PROVIDER_SITE_OTHER): Payer: No Typology Code available for payment source | Admitting: Psychology

## 2018-01-14 ENCOUNTER — Encounter: Payer: Self-pay | Admitting: Psychology

## 2018-01-14 DIAGNOSIS — R413 Other amnesia: Secondary | ICD-10-CM

## 2018-01-17 ENCOUNTER — Ambulatory Visit
Admission: EM | Admit: 2018-01-17 | Discharge: 2018-01-17 | Disposition: A | Discharge status: Home / Self Care | Payer: No Typology Code available for payment source | Attending: Emergency Medicine | Admitting: Emergency Medicine

## 2018-01-17 ENCOUNTER — Other Ambulatory Visit: Payer: Self-pay

## 2018-01-17 DIAGNOSIS — J111 Influenza due to unidentified influenza virus with other respiratory manifestations: Secondary | ICD-10-CM

## 2018-01-17 DIAGNOSIS — R6883 Chills (without fever): Secondary | ICD-10-CM

## 2018-01-17 DIAGNOSIS — R51 Headache: Secondary | ICD-10-CM

## 2018-01-17 DIAGNOSIS — M791 Myalgia, unspecified site: Secondary | ICD-10-CM

## 2018-01-17 DIAGNOSIS — R05 Cough: Secondary | ICD-10-CM

## 2018-01-17 LAB — RAPID INFLUENZA A&B ANTIGENS (ARMC ONLY)
INFLUENZA A (ARMC): NEGATIVE
INFLUENZA B (ARMC): NEGATIVE

## 2018-01-17 MED ORDER — BENZONATATE 200 MG PO CAPS: 200.0000 mg | ORAL_CAPSULE | Freq: Three times a day (TID) | ORAL | 0 refills | Status: DC | PRN

## 2018-01-17 MED ORDER — HYDROCOD POLST-CPM POLST ER 10-8 MG/5ML PO SUER: 5.0000 mL | Freq: Two times a day (BID) | ORAL | 0 refills | Status: DC | PRN

## 2018-01-17 MED ORDER — AEROCHAMBER PLUS MISC: 2 refills | Status: DC

## 2018-01-17 MED ORDER — ALBUTEROL SULFATE HFA 108 (90 BASE) MCG/ACT IN AERS: 1.0000 | INHALATION_SPRAY | Freq: Four times a day (QID) | RESPIRATORY_TRACT | 0 refills | Status: DC | PRN

## 2018-01-17 MED ORDER — OSELTAMIVIR PHOSPHATE 75 MG PO CAPS: 75.0000 mg | ORAL_CAPSULE | Freq: Two times a day (BID) | ORAL | 0 refills | Status: DC

## 2018-01-17 MED ORDER — IBUPROFEN 600 MG PO TABS: 600.0000 mg | ORAL_TABLET | Freq: Four times a day (QID) | ORAL | 0 refills | Status: DC | PRN

## 2018-01-17 NOTE — ED Provider Notes (Signed)
HPI  SUBJECTIVE:  Daniel Calderon is a 52 y.o. male who presents with the abrupt onset of cough, fevers 101, chills, body aches, headaches, nasal congestion, postnasal drip, rhinorrhea.  He reports frontal sinus pain and pressure, ear pain.  He reports wheezing.  Also some burning substernal chest pain with coughing.  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  He did get a flu shot this year.  He tried Robitussin with improvement in his cough.  Symptoms are worse with breathing.  No sore throat, change in hearing, otorrhea, shortness of breath, dyspnea on exertion.  No photophobia, rash, neck stiffness.  No abdominal pain, urinary complaints.  No antipyretic in the past 6-8 hours or antibiotics in the past month.  He has a past medical history of hypertension, GERD.  No history of diabetes, asthma, emphysema, COPD, smoking, immunocompromise.  PMD: [name], MD   Past Medical History:  Diagnosis Date  . Allergy    Seasonal  . Anxiety   . Benign enlargement of prostate   . Depression   . Elevated BP   . Elevated transaminase level   . Failure of erection   . Fatigue   . Frequent headaches   . Hypogonadism in male   . Kidney stones    History of kidney stones  . Migraine   . Sleep apnea    Currently uses cpap  . Thoracic outlet syndrome    Left Arm    Past Surgical History:  Procedure Laterality Date  . COLONOSCOPY WITH PROPOFOL N/A 11/11/2017   Procedure: COLONOSCOPY WITH PROPOFOL;  Surgeon: Lin Landsman, MD;  Location: [place];  Service: Endoscopy;  Laterality: N/A;  . Left Shoulder Surgery  2011  . nerve block     2 in neck. 5 in back.  . NOSE SURGERY    . POLYPECTOMY  11/11/2017   Procedure: POLYPECTOMY;  Surgeon: Lin Landsman, MD;  Location: [place];  Service: Endoscopy;;  . SCALENE NODE BIOPSY / EXCISION    . VASECTOMY      Family History  Problem Relation Age of Onset  . Hypertension  Mother        Living  . Hearing loss Mother   . Heart disease Other   . Healthy Father        Living  . Diabetes Brother   . Stroke Paternal Uncle   . Heart attack Paternal Uncle   . Hearing loss Maternal Grandmother   . Healthy Son   . Healthy Daughter   . Kidney cancer Neg Hx   . Bladder Cancer Neg Hx   . Prostate cancer Neg Hx     Social History   Tobacco Use  . Smoking status: Never Smoker  . Smokeless tobacco: Never Used  Substance Use Topics  . Alcohol use: Yes    Alcohol/week: 1.8 oz    Types: 3 Cans of beer per week  . Drug use: No    No current facility-administered medications for this encounter.   Current Outpatient Medications:  .  albuterol (PROVENTIL HFA;VENTOLIN HFA) 108 (90 Base) MCG/ACT inhaler, Inhale 1-2 puffs into the lungs every 6 (six) hours as needed for wheezing or shortness of breath., Disp: 1 Inhaler, Rfl: 0 .  amLODipine (NORVASC) 10 MG tablet, Take 1 tablet (10 mg total) by mouth daily., Disp: 90 tablet, Rfl: 3 .  benzonatate (TESSALON) 200 MG capsule, Take 1 capsule (200 mg total) by  mouth 3 (three) times daily as needed for cough., Disp: 30 capsule, Rfl: 0 .  chlorpheniramine-HYDROcodone (TUSSIONEX PENNKINETIC ER) 10-8 MG/5ML SUER, Take 5 mLs by mouth every 12 (twelve) hours as needed for cough., Disp: 120 mL, Rfl: 0 .  Cyanocobalamin (VITAMIN B 12 PO), Take by mouth., Disp: , Rfl:  .  fluticasone (FLONASE) 50 MCG/ACT nasal spray, Place 2 sprays into both nostrils daily., Disp: 16 g, Rfl: 2 .  ibuprofen (ADVIL,MOTRIN) 600 MG tablet, Take 1 tablet (600 mg total) by mouth every 6 (six) hours as needed., Disp: 30 tablet, Rfl: 0 .  IRON, FERROUS SULFATE, PO, Take by mouth., Disp: , Rfl:  .  magnesium oxide (MAG-OX) 400 MG tablet, Take 400 mg by mouth daily., Disp: , Rfl:  .  Multiple Vitamin (MULTIVITAMIN) tablet, Take 1 tablet by mouth daily., Disp: , Rfl:  .  omeprazole (PRILOSEC) 20 MG capsule, TAKE 1 CAPSULE BY MOUTH DAILY, Disp: 90 capsule,  Rfl: 3 .  oseltamivir (TAMIFLU) 75 MG capsule, Take 1 capsule (75 mg total) by mouth 2 (two) times daily. X 5 days, Disp: 10 capsule, Rfl: 0 .  sertraline (ZOLOFT) 100 MG tablet, TAKE 1 TABLET BY MOUTH ONCE DAILY, Disp: 30 tablet, Rfl: 2 .  Spacer/Aero-Holding Chambers (AEROCHAMBER PLUS) inhaler, Use as instructed, Disp: 1 each, Rfl: 2 .  testosterone (ANDROGEL) 50 MG/5GM (1%) GEL, Place 5 g onto the skin daily., Disp: 30 Tube, Rfl: 3  Allergies  Allergen Reactions  . Erythromycin Nausea And Vomiting  . Cephalexin     Other reaction(s): Other (See Comments) Other Reaction: Other reaction  . Dexamethasone     Other reaction(s): Other (See Comments) Other Reaction: Other reaction  . Oxycodone-Acetaminophen     Other reaction(s): Other (See Comments) Other Reaction: Other reaction  . Oxycodone-Acetaminophen     Other reaction(s): Other (See Comments) Other Reaction: Other reaction  . Prednisone     Other reaction(s): Other (See Comments) Other Reaction: Other reaction  . Gabapentin Other (See Comments)    Aggressive  . Ibuprofen Itching    Can take in low doses per pt     ROS  As noted in HPI.   Physical Exam  BP (!) 146/82 (BP Location: Right Arm)   Pulse (!) 106   Temp 99.2 F (37.3 C) (Oral)   Resp 20   Ht 6\' 2"  (1.88 m)   Wt 210 lb (95.3 kg)   SpO2 98%   BMI 26.96 kg/m   Constitutional: Well developed, well nourished, no acute distress Eyes: PERRL, EOMI, conjunctiva normal bilaterally HENT: Normocephalic, atraumatic,mucus membranes moist.  TMs normal bilaterally.  Positive clear rhinorrhea.  Normal turbinates.  No sinus tenderness.  Tonsils surgically absent.  Oropharynx normal.  No cobblestoning, postnasal drip. Neck: Positive shotty cervical lymphadenopathy.  No meningismus. Respiratory: Clear to auscultation bilaterally, no rales, no wheezing, no rhonchi Cardiovascular: Mild regular tachycardia, no murmurs, no gallops, no rubs GI: Soft, nondistended, normal  bowel sounds, nontender, no rebound, no guarding Back: no CVAT skin: No rash, skin intact Musculoskeletal: no deformities Neurologic: Alert & oriented x 3, CN II-XII grossly intact, no motor deficits, sensation grossly intact Psychiatric: Speech and behavior appropriate   ED Course   Medications - No data to display  Orders Placed This Encounter  Procedures  . Rapid Influenza A&B Antigens ([facility] only)    Standing Status:   Standing    Number of Occurrences:   1  . Droplet precaution    Standing Status:  Standing    Number of Occurrences:   1   Results for orders placed or performed during the hospital encounter of 01/17/18 (from the past 24 hour(s))  Rapid Influenza A&B Antigens ([place] only)     Status: None   Collection Time: 01/17/18 11:38 AM  Result Value Ref Range   Influenza A ([facility]) NEGATIVE NEGATIVE   Influenza B ([facility]) NEGATIVE NEGATIVE   No results found.  ED Clinical Impression  Influenza   ED Assessment/Plan  Presentation consistent with influenza.  His rapid flu is negative, however will treat with Tamiflu, ibuprofen 600 mg take with 1 g of Tylenol 3-4 times a day, albuterol inhaler with a spacer because he is reporting some wheezing although this might be due to his GERD.  Chest x-ray not done today in the absence of focal lung findings.  Also Tessalon, Tussionex and a work note for 3 days.  Follow-up with PMD in several days if not getting better, to the ER if he gets worse.    [place] Narcotic database reviewed for this patient, and feel that the risk/benefit ratio today is favorable for proceeding with a prescription for controlled substance.  No opiate prescriptions in the past 2 years.  Discussed labs,  MDM, plan and followup with patient Discussed sn/sx that should prompt return to the ED. patient agrees with plan.   Meds ordered this encounter  Medications  . benzonatate (TESSALON) 200 MG capsule    Sig: Take 1 capsule (200 mg total) by mouth 3 (three)  times daily as needed for cough.    Dispense:  30 capsule    Refill:  0  . chlorpheniramine-HYDROcodone (TUSSIONEX PENNKINETIC ER) 10-8 MG/5ML SUER    Sig: Take 5 mLs by mouth every 12 (twelve) hours as needed for cough.    Dispense:  120 mL    Refill:  0  . oseltamivir (TAMIFLU) 75 MG capsule    Sig: Take 1 capsule (75 mg total) by mouth 2 (two) times daily. X 5 days    Dispense:  10 capsule    Refill:  0  . ibuprofen (ADVIL,MOTRIN) 600 MG tablet    Sig: Take 1 tablet (600 mg total) by mouth every 6 (six) hours as needed.    Dispense:  30 tablet    Refill:  0  . Spacer/Aero-Holding Chambers (AEROCHAMBER PLUS) inhaler    Sig: Use as instructed    Dispense:  1 each    Refill:  2  . albuterol (PROVENTIL HFA;VENTOLIN HFA) 108 (90 Base) MCG/ACT inhaler    Sig: Inhale 1-2 puffs into the lungs every 6 (six) hours as needed for wheezing or shortness of breath.    Dispense:  1 Inhaler    Refill:  0    *This clinic note was created using [employer]. Therefore, there may be occasional mistakes despite careful proofreading.  ?   Melynda Ripple, MD 01/17/18 [phone]

## 2018-01-17 NOTE — ED Triage Notes (Addendum)
Pt started yesterday with bodyaches, fever, and cough. Fever was 101.2 this a.m. But improved with Aleve

## 2018-01-17 NOTE — Discharge Instructions (Signed)
600 mg of ibuprofen with 2 extra strength, or 1000 mg of Tylenol 3-4 times a day as needed for fever, body aches.  2 puffs from your albuterol inhaler using a spacer every 4-6 hours as needed for coughing, wheezing.  Tessalon during the day, Tussionex at night.  Finish the Tamiflu, even if you feel better.

## 2018-01-19 ENCOUNTER — Telehealth: Payer: Self-pay

## 2018-01-19 NOTE — Telephone Encounter (Signed)
fyi

## 2018-01-19 NOTE — Telephone Encounter (Signed)
Copied from [place]. Topic: Referral - Question >> Jan 19, 2018  3:00 PM Synthia Innocent wrote: Reason for CRM: Dr Gabriel Carina w/ [facility] declined referral due to labs being normal for cortis, needs to be abnormal in order for her to see patient

## 2018-01-20 ENCOUNTER — Encounter: Payer: Self-pay | Admitting: Psychiatry

## 2018-01-20 ENCOUNTER — Ambulatory Visit: Payer: Self-pay

## 2018-01-20 ENCOUNTER — Telehealth: Payer: Self-pay

## 2018-01-20 ENCOUNTER — Ambulatory Visit (INDEPENDENT_AMBULATORY_CARE_PROVIDER_SITE_OTHER): Payer: Self-pay | Admitting: Psychiatry

## 2018-01-20 VITALS — BP 132/82 | HR 81 | Temp 98.4°F

## 2018-01-20 DIAGNOSIS — R6889 Other general symptoms and signs: Secondary | ICD-10-CM

## 2018-01-20 LAB — POCT RAPID STREP A (OFFICE): Rapid Strep A Screen: NEGATIVE

## 2018-01-20 MED ORDER — ALBUTEROL SULFATE HFA 108 (90 BASE) MCG/ACT IN AERS: 2.0000 | INHALATION_SPRAY | Freq: Four times a day (QID) | RESPIRATORY_TRACT | 0 refills | Status: DC | PRN

## 2018-01-20 MED ORDER — AEROCHAMBER PLUS FLO-VU LARGE MISC: 1.0000 | Freq: Once | Status: DC

## 2018-01-20 NOTE — Progress Notes (Signed)
Subjective:     Daniel Calderon is a 52 y.o. male who presents for follow up evaluation of influenza like symptoms. He reports having some improvement overall but continues to have unrelived sore throat. He is on day 3 of his current regimen. He denies any new symptom except chest pain with cough and earache and occasional SOB.  The following portions of the patient's history were reviewed and updated as appropriate: allergies, current medications, past family history, past medical history, past social history, past surgical history and problem list.  Review of Systems Pertinent items are noted in HPI.     Objective:    General appearance: alert, cooperative and no distress Head: Normocephalic, without obvious abnormality, atraumatic Ears: normal TM's and external ear canals both ears Nose: Nares normal. Septum midline. Mucosa normal. No drainage or sinus tenderness. Throat: abnormal findings: whitish plaques to the tongue Neck: no adenopathy, no carotid bruit, no JVD, supple, symmetrical, trachea midline and thyroid not enlarged, symmetric, no tenderness/mass/nodules Lungs: clear to auscultation bilaterally Heart: regular rate and rhythm, S1, S2 normal, no murmur, click, rub or gallop    Assessment:    Influenza like syndrome    Plan:    Supportive care with appropriate antipyretics and fluids. Follow up in 5 days or as needed. Continue current flu regimen as prescribed    Will resend inhaler to [place], use as prescribed. Work note through Sunday (4days) given. Continue to drink plenty of fluid and get plenty of rest.

## 2018-01-20 NOTE — Telephone Encounter (Signed)
Noted.  Please let the patient know that the endocrinologist declined his referral if due to them reviewing his lab work and feeling his cortisol was normal.  Thanks.

## 2018-01-20 NOTE — Telephone Encounter (Signed)
Left message to return call to notify patient of DR.Sonnenbergs message   R.Sonnenberg: Noted.  Please let the patient know that the endocrinologist declined his referral if due to them reviewing his lab work and feeling his cortisol was normal.  Thanks.

## 2018-01-20 NOTE — Telephone Encounter (Signed)
Called to follow up with patient since visit here at [facility]. Spoke with pt, pt reports no improvement is following up with provider today. Patient instructed to call back with any questions or concerns. [facility]

## 2018-01-25 NOTE — Telephone Encounter (Signed)
Patient notified

## 2018-02-01 ENCOUNTER — Other Ambulatory Visit: Payer: No Typology Code available for payment source

## 2018-02-01 ENCOUNTER — Other Ambulatory Visit: Payer: Self-pay | Admitting: Family Medicine

## 2018-02-01 DIAGNOSIS — R7989 Other specified abnormal findings of blood chemistry: Secondary | ICD-10-CM

## 2018-02-02 LAB — HEMOGLOBIN AND HEMATOCRIT, BLOOD
HEMOGLOBIN: 13.3 g/dL (ref 13.0–17.7)
Hematocrit: 38.4 % (ref 37.5–51.0)

## 2018-02-02 LAB — TESTOSTERONE: Testosterone: 209 ng/dL — ABNORMAL LOW (ref 264–916)

## 2018-02-02 LAB — PSA: PROSTATE SPECIFIC AG, SERUM: 0.2 ng/mL (ref 0.0–4.0)

## 2018-02-04 ENCOUNTER — Telehealth: Payer: Self-pay

## 2018-02-04 ENCOUNTER — Telehealth: Payer: No Typology Code available for payment source | Admitting: Physician Assistant

## 2018-02-04 DIAGNOSIS — R079 Chest pain, unspecified: Secondary | ICD-10-CM

## 2018-02-04 DIAGNOSIS — R0989 Other specified symptoms and signs involving the circulatory and respiratory systems: Secondary | ICD-10-CM

## 2018-02-04 NOTE — Telephone Encounter (Signed)
PA for Androgel APPROVED!

## 2018-02-04 NOTE — Progress Notes (Signed)
Based on what you shared with me it looks like you have a condition that should be evaluated in a face to face office visit.  Giving URI symptoms along with recent influenza and symptom of chest pain, you need in person assessment to r/o pneumonia or other worrisome causes of chest discomfort.   NOTE: If you entered your credit card information for this eVisit, you will not be charged. You may see a "hold" on your card for the $30 but that hold will drop off and you will not have a charge processed.  If you are having a true medical emergency please call 911.  If you need an urgent face to face visit,  has four urgent care centers for your convenience.  If you need care fast and have a high deductible or no insurance consider:   [email or web address] to reserve your spot online an avoid wait times  [facility] [address], [address] [place], [place] [address] 8 am to 8 pm Monday-Friday 10 am to 4 pm Saturday-Sunday *Across the street from [employer]  [place], [address] 8 am to 5 pm Monday-Friday * In the [place] on the [facility]   The following sites will take your  insurance:  . [facility] [place] a Provider at this Location  [address] [place], [place] [address] . 10 am to 8 pm Monday-Friday . 12 pm to 8 pm Saturday-Sunday   . [facility] at [place] a Provider at this Location  [address], [place], [place] [address] . 8 am to 8 pm Monday-Friday . 9 am to 6 pm Saturday . 11 am to 6 pm Sunday   . [facility] at [place] Get Driving Directions  [address], [place] [address] . 8 am to 8 pm Monday-Friday . 8 am to 4 pm Saturday-Sunday   Your e-visit answers were reviewed by a board  certified advanced clinical practitioner to complete your personal care plan.  Thank you for using e-Visits.

## 2018-02-05 ENCOUNTER — Ambulatory Visit: Payer: No Typology Code available for payment source | Admitting: Urology

## 2018-02-05 ENCOUNTER — Encounter: Payer: Self-pay | Admitting: Urology

## 2018-02-11 ENCOUNTER — Encounter: Payer: Self-pay | Admitting: Family Medicine

## 2018-02-11 ENCOUNTER — Ambulatory Visit (INDEPENDENT_AMBULATORY_CARE_PROVIDER_SITE_OTHER): Payer: No Typology Code available for payment source | Admitting: Family Medicine

## 2018-02-11 ENCOUNTER — Telehealth: Payer: Self-pay

## 2018-02-11 VITALS — BP 122/84 | HR 80 | Temp 98.7°F | Resp 95 | Wt 214.1 lb

## 2018-02-11 DIAGNOSIS — M79605 Pain in left leg: Secondary | ICD-10-CM | POA: Diagnosis not present

## 2018-02-11 DIAGNOSIS — M79604 Pain in right leg: Secondary | ICD-10-CM

## 2018-02-11 NOTE — Progress Notes (Signed)
Subjective:    Patient ID: Daniel Calderon, male    DOB: 02-Jul-1966, 52 y.o.   MRN: [id number]  HPI  [name] is a 52 year old male who present today with bilateral leg weakness for several months however he noticed pain in his legs when walking stairs at work one day ago. This pain occurred after climbing a couple of flights of stairs and resolved with rest. He is not having pain at this time and is not experiencing pain with walking at this time. He denies chest pain, palpitations, SOB, N/V, jaw pain, arm pain, numbness, tingling, or weakness now or during the episode where his legs were hurting when climbing stairs. He reports associated weakness that was present with pain that was rated as aching.  He has a chronic history of chronic fatigue, thoracic outlet syndrome where he underwent surgery in 2011 and is followed by neurology. He recently had neuro psych testing completed and findings did not indicate a neurocognitive disorder at this time.  History also includes HTN and  Migraines.  Pain resolved with rest after he stopped climbing stairs and was noted to last a "few minutes". He denies any ulcerations or pain in his sleep. He was also able to resume his activities immediately. No treatments have been tried at home. He is a nonsmoker  Review of Systems  Constitutional: Negative for chills, fatigue and fever.  Eyes: Negative for visual disturbance.  Respiratory: Negative for cough, shortness of breath and wheezing.   Cardiovascular: Negative for chest pain, palpitations and leg swelling.  Gastrointestinal: Negative for abdominal pain, diarrhea, nausea and vomiting.  Musculoskeletal: Negative for back pain, gait problem, joint swelling and myalgias.       Pain in legs  Neurological: Negative for dizziness, weakness, light-headedness and headaches.  Psychiatric/Behavioral:       Denies depressed or anxious mood   Past Medical History:  Diagnosis Date  . Allergy    Seasonal    . Anxiety   . Benign enlargement of prostate   . Depression   . Elevated BP   . Elevated transaminase level   . Failure of erection   . Fatigue   . Frequent headaches   . Hypogonadism in male   . Kidney stones    History of kidney stones  . Migraine   . Sleep apnea    Currently uses cpap  . Thoracic outlet syndrome    Left Arm     Social History   Socioeconomic History  . Marital status: Married    Spouse name: Not on file  . Number of children: Not on file  . Years of education: Not on file  . Highest education level: Not on file  Social Needs  . Financial resource strain: Not on file  . Food insecurity - worry: Not on file  . Food insecurity - inability: Not on file  . Transportation needs - medical: Not on file  . Transportation needs - non-medical: Not on file  Occupational History  . Not on file  Tobacco Use  . Smoking status: Never Smoker  . Smokeless tobacco: Never Used  Substance and Sexual Activity  . Alcohol use: Yes    Alcohol/week: 1.8 oz    Types: 3 Cans of beer per week  . Drug use: No  . Sexual activity: Yes    Partners: Female    Comment: Wife  Other Topics Concern  . 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 COLONOSCOPY WITH PROPOFOL N/A 11/11/2017   Procedure: COLONOSCOPY WITH PROPOFOL;  Surgeon: Lin Landsman, MD;  Location: [place];  Service: Endoscopy;  Laterality: N/A;  . Left Shoulder Surgery  2011  . nerve block     2 in neck. 5 in back.  . NOSE SURGERY    . POLYPECTOMY  11/11/2017   Procedure: POLYPECTOMY;  Surgeon: Lin Landsman, MD;  Location: [place];  Service: Endoscopy;;  . SCALENE NODE BIOPSY / EXCISION    . VASECTOMY      Family History  Problem Relation Age of  Onset  . Hypertension Mother        Living  . Hearing loss Mother   . Heart disease Other   . Healthy Father        Living  . Diabetes Brother   . Stroke Paternal Uncle   . Heart attack Paternal Uncle   . Hearing loss Maternal Grandmother   . Healthy Son   . Healthy Daughter   . Kidney cancer Neg Hx   . Bladder Cancer Neg Hx   . Prostate cancer Neg Hx     Allergies  Allergen Reactions  . Erythromycin Nausea And Vomiting  . Cephalexin     Other reaction(s): Other (See Comments) Other Reaction: Other reaction  . Dexamethasone     Other reaction(s): Other (See Comments) Other Reaction: Other reaction  . Oxycodone-Acetaminophen     Other reaction(s): Other (See Comments) Other Reaction: Other reaction  . Oxycodone-Acetaminophen     Other reaction(s): Other (See Comments) Other Reaction: Other reaction  . Prednisone     Other reaction(s): Other (See Comments) Other Reaction: Other reaction  . Gabapentin Other (See Comments)    Aggressive  . Ibuprofen Itching    Can take in low doses per pt    Current Outpatient Medications on File Prior to Visit  Medication Sig Dispense Refill  . albuterol (PROVENTIL HFA;VENTOLIN HFA) 108 (90 Base) MCG/ACT inhaler Inhale 2 puffs into the lungs every 6 (six) hours as needed for wheezing or shortness of breath. 1 Inhaler 0  . Cyanocobalamin (VITAMIN B 12 PO) Take by mouth.    . fluticasone (FLONASE) 50 MCG/ACT nasal spray Place 2 sprays into both nostrils daily. 16 g 2  . ibuprofen (ADVIL,MOTRIN) 600 MG tablet Take 1 tablet (600 mg total) by mouth every 6 (six) hours as needed. 30 tablet 0  . IRON, FERROUS SULFATE, PO Take by mouth.    . magnesium oxide (MAG-OX) 400 MG tablet Take 400 mg by mouth daily.    . Multiple Vitamin (MULTIVITAMIN) tablet Take 1 tablet by mouth daily.    Mar[place] Kitchen omeprazole (PRILOSEC) 20 MG capsule TAKE 1 CAPSULE BY MOUTH DAILY 90 capsule 3  . sertraline (ZOLOFT) 100 MG tablet TAKE 1 TABLET BY MOUTH ONCE DAILY 30 tablet  2  . Spacer/Aero-Holding Chambers (AEROCHAMBER PLUS) inhaler Use as instructed 1 each 2  . testosterone (ANDROGEL) 50 MG/5GM (1%) GEL Place 5 g onto the skin daily. 30 Tube 3  . amLODipine (NORVASC) 10 MG tablet Take 1 tablet (10 mg total) by mouth daily. 90 tablet 3   Current Facility-Administered Medications on File Prior to Visit  Medication Dose Route Frequency  Provider Last Rate Last Dose  . AEROCHAMBER PLUS FLO-VU LARGE MISC 1 each  1 each Other Once [place], Justina A, NP        BP 122/84 (BP Location: Left Arm, Patient Position: Sitting, Cuff Size: Normal)   Pulse 80   Temp 98.7 F (37.1 C) (Oral)   Resp (!) 95   Wt 214 lb 2 oz (97.1 kg)   BMI 27.49 kg/m        Objective:   Physical Exam  Constitutional: He is oriented to person, place, and time. He appears well-developed and well-nourished.  Eyes: Pupils are equal, round, and reactive to light. No scleral icterus.  Neck: Neck supple.  Cardiovascular: Normal rate, regular rhythm and intact distal pulses.  Pulses:      Dorsalis pedis pulses are 2+ on the right side, and 2+ on the left side.       Posterior tibial pulses are 2+ on the right side, and 2+ on the left side.  Pulmonary/Chest: Effort normal and breath sounds normal. He has no wheezes. He has no rales.  Abdominal: Soft. Bowel sounds are normal. There is no tenderness. There is no rebound.  Musculoskeletal: He exhibits no edema.  Lymphadenopathy:    He has no cervical adenopathy.  Neurological: He is alert and oriented to person, place, and time. He has normal strength. No sensory deficit. Gait normal.  II-Visual fields grossly intact. III/IV/VI-Extraocular movements intact. Pupils reactive bilaterally. V/VII-Smile symmetric, equal eyebrow raise, facial sensation intact VIII- Hearing grossly intact Motor: 5/5 bilaterally with normal tone and bulk Ambulates with a coordinated gait  Skin: Skin is warm and dry. No rash noted.  No ischemia or ulcers present  bilaterally. Hair noted on legs and no evidence of dry, shiny skin      Assessment & Plan:  1. Pain in both lower extremitiesn Exam is reassuring; no ischemia, edema, or ulcers noted. No pain today. Symptoms were noted to be induced with exercise and relieved with rest and he has not reproduced these symptoms since yesterday. Symptoms are most likely consistent with intermittent claudication and less likely cardiopulmonary in nature with normal neuro and cardiac exam and absence of cardiopulmonary symptoms with climbing stairs. Will evaluate ABIs for further evaluation. Return precautions advised.   Delano Metz, FNP-C

## 2018-02-11 NOTE — Patient Instructions (Addendum)
A referral for testing has been placed and you will be contacted with instructions of time and place for testing. If symptoms do not improve with treatment, worsen, or you develop new symptoms, please follow up with your provider for further evaluation and treatment.  Intermittent Claudication Intermittent claudication is pain in your leg that occurs when you walk or exercise and goes away when you rest. The pain can occur in one or both legs. What are the causes? Intermittent claudication is caused by the buildup of plaque within the major arteries in the body (atherosclerosis). The plaque, which makes arteries stiff and narrow, prevents enough blood from reaching your leg muscles. The pain occurs when you walk or exercise because your muscles need more blood when you are moving and exercising. What increases the risk? Risk factors include:h   A family history of atherosclerosis.  A personal history of stroke or heart disease.  Older age.  Being inactive or overweight.  Smoking cigarettes.  Having another health condition such as: ? Diabetes. ? High blood pressure. ? High cholesterol.  What are the signs or symptoms? Your hip or leg may:  Ache.  Cramp.  Feel tight.  Feel weak.  Feel heavy.  Over time, you may feel pain in your calf, thigh, or hip. How is this diagnosed? Your health care provider may diagnose intermittent claudication based on your symptoms and medical history. Your health care provider may also do tests to learn more about your condition. These may include:  Blood tests.  An ultrasound.  Imaging tests such as angiography, magnetic resonance angiography (MRA), and computed tomography angiography (CTA).  How is this treated? You may be treated for problems such as:  High blood pressure.  High cholesterol.  Diabetes.  Other treatments may include:  Lifestyle changes such as: ? Starting an exercise program. ? Losing weight. ? Quitting  smoking.  Medicines to help restore blood flow through your legs.  Blood vessel surgery (angioplasty) to restore blood flow if your intermittent claudication is caused by severe peripheral artery disease.  Follow these instructions at home:  Manage any other health conditions you have.  Eat a diet low in saturated fats and calories to maintain a healthy weight.  Quit smoking, if you smoke.  Take medicines only as directed by your health care provider.  If your health care provider recommended an exercise program for you, follow it as directed. Your exercise program may involve: ? Walking three or more times a week. ? Walking until you have certain symptoms of intermittent claudication. ? Resting until symptoms go away. ? Gradually increasing walking time to about 50 minutes a day. Contact a health care provider if: Your condition is not getting better or is getting worse. Get help right away if:  You have chest pain.  You have difficulty breathing.  You develop arm weakness.  You have trouble speaking.  Your face begins to droop. This information is not intended to replace advice given to you by your health care provider. Make sure you discuss any questions you have with your health care provider. Document Released: 10/10/2004 Document Revised: 05/15/2016 Document Reviewed: 03/16/2014 Elsevier Interactive Patient Education  2017 [employer].

## 2018-02-11 NOTE — Telephone Encounter (Signed)
Patient was put on Almyra Free Kordsmier's schedule and he is having increased bilateral leg weakness still.

## 2018-02-19 ENCOUNTER — Ambulatory Visit (INDEPENDENT_AMBULATORY_CARE_PROVIDER_SITE_OTHER): Payer: No Typology Code available for payment source | Admitting: Vascular Surgery

## 2018-02-19 ENCOUNTER — Other Ambulatory Visit: Payer: Self-pay | Admitting: Family Medicine

## 2018-02-19 ENCOUNTER — Encounter (INDEPENDENT_AMBULATORY_CARE_PROVIDER_SITE_OTHER): Payer: Self-pay | Admitting: Vascular Surgery

## 2018-02-19 ENCOUNTER — Ambulatory Visit (INDEPENDENT_AMBULATORY_CARE_PROVIDER_SITE_OTHER): Payer: No Typology Code available for payment source

## 2018-02-19 VITALS — BP 134/85 | HR 73 | Resp 15 | Ht 74.0 in | Wt 211.0 lb

## 2018-02-19 DIAGNOSIS — G540 Brachial plexus disorders: Secondary | ICD-10-CM

## 2018-02-19 DIAGNOSIS — M79605 Pain in left leg: Secondary | ICD-10-CM

## 2018-02-19 DIAGNOSIS — I10 Essential (primary) hypertension: Secondary | ICD-10-CM

## 2018-02-19 DIAGNOSIS — M79609 Pain in unspecified limb: Secondary | ICD-10-CM | POA: Insufficient documentation

## 2018-02-19 DIAGNOSIS — M79604 Pain in right leg: Secondary | ICD-10-CM

## 2018-02-19 NOTE — Assessment & Plan Note (Signed)
blood pressure control important in reducing the progression of atherosclerotic disease. On appropriate oral medications.  

## 2018-02-19 NOTE — Progress Notes (Signed)
Patient ID: Daniel Calderon, male   DOB: [birth year], 52 y.o.   MRN: [id number]  Chief Complaint  Patient presents with  . Follow-up    Ultrasound-Lower Arterial    HPI Daniel Calderon is a 52 y.o. male.  I am asked to see the patient by Jeral Pinch, FNP for evaluation of leg Calderon.  The patient reports his legs become weak and tired with short to medium distances of walking.  This is exacerbated by going up steps or inclines.  He has had progression of symptoms over weeks to months.  There is no clear inciting event or causative factor that started the symptoms.  No lower extremity ulcerations or infection.  No previous history of vascular intervention to the lower extremities.  He has had a previous history of thoracic outlet syndrome. To evaluate his lower extremity symptoms noninvasive arterial studies were performed today.  These demonstrate brisk triphasic waveforms with normal ABIs and normal digital pressures bilaterally consistent with no arterial insufficiency.   Past Medical History:  Diagnosis Date  . Allergy    Seasonal  . Anxiety   . Benign enlargement of prostate   . Depression   . Elevated BP   . Elevated transaminase level   . Failure of erection   . Fatigue   . Frequent headaches   . Hypogonadism in male   . Kidney stones    History of kidney stones  . Migraine   . Sleep apnea    Currently uses cpap  . Thoracic outlet syndrome    Left Arm    Past Surgical History:  Procedure Laterality Date  . COLONOSCOPY WITH PROPOFOL N/A 11/11/2017   Procedure: COLONOSCOPY WITH PROPOFOL;  Surgeon: Lin Landsman, MD;  Location: [facility];  Service: Endoscopy;  Laterality: N/A;  . Left Shoulder Surgery  2011  . nerve block     2 in neck. 5 in back.  . NOSE SURGERY    . POLYPECTOMY  11/11/2017   Procedure: POLYPECTOMY;  Surgeon: Lin Landsman, MD;  Location: [facility];  Service: Endoscopy;;  . SCALENE NODE BIOPSY / EXCISION    .  VASECTOMY      Family History  Problem Relation Age of Onset  . Hypertension Mother        Living  . Hearing loss Mother   . Heart disease Other   . Healthy Father        Living  . Diabetes Brother   . Stroke Paternal Uncle   . Heart attack Paternal Uncle   . Hearing loss Maternal Grandmother   . Healthy Son   . Healthy Daughter   . Kidney cancer Neg Hx   . Bladder Cancer Neg Hx   . Prostate cancer Neg Hx      Social History Social History   Tobacco Use  . Smoking status: Never Smoker  . Smokeless tobacco: Never Used  Substance Use Topics  . Alcohol use: Yes    Alcohol/week: 1.8 oz    Types: 3 Cans of beer per week  . Drug use: No    Allergies  Allergen Reactions  . Erythromycin Nausea And Vomiting  . Cephalexin     Other reaction(s): Other (See Comments) Other Reaction: Other reaction  . Dexamethasone     Other reaction(s): Other (See Comments) Other Reaction: Other reaction  . Oxycodone-Acetaminophen     Other reaction(s): Other (See Comments) Other Reaction: Other reaction  . Oxycodone-Acetaminophen  Other reaction(s): Other (See Comments) Other Reaction: Other reaction  . Prednisone     Other reaction(s): Other (See Comments) Other Reaction: Other reaction  . Gabapentin Other (See Comments)    Aggressive  . Ibuprofen Itching    Can take in low doses per pt    Current Outpatient Medications  Medication Sig Dispense Refill  . Cyanocobalamin (VITAMIN B 12 PO) Take by mouth.    . fluticasone (FLONASE) 50 MCG/ACT nasal spray Place 2 sprays into both nostrils daily. 16 g 2  . ibuprofen (ADVIL,MOTRIN) 600 MG tablet Take 1 tablet (600 mg total) by mouth every 6 (six) hours as needed. 30 tablet 0  . IRON, FERROUS SULFATE, PO Take by mouth.    . magnesium oxide (MAG-OX) 400 MG tablet Take 400 mg by mouth daily.    . Multiple Vitamin (MULTIVITAMIN) tablet Take 1 tablet by mouth daily.    Marland Kitchen omeprazole (PRILOSEC) 20 MG capsule TAKE 1 CAPSULE BY MOUTH  DAILY 90 capsule 3  . sertraline (ZOLOFT) 100 MG tablet TAKE 1 TABLET BY MOUTH ONCE DAILY 30 tablet 2  . Spacer/Aero-Holding Chambers (AEROCHAMBER PLUS) inhaler Use as instructed 1 each 2  . testosterone (ANDROGEL) 50 MG/5GM (1%) GEL Place 5 g onto the skin daily. 30 Tube 3  . albuterol (PROVENTIL HFA;VENTOLIN HFA) 108 (90 Base) MCG/ACT inhaler Inhale 2 puffs into the lungs every 6 (six) hours as needed for wheezing or shortness of breath. (Patient not taking: Reported on 02/19/2018) 1 Inhaler 0  . amLODipine (NORVASC) 10 MG tablet Take 1 tablet (10 mg total) by mouth daily. 90 tablet 3   Current Facility-Administered Medications  Medication Dose Route Frequency Provider Last Rate Last Dose  . AEROCHAMBER PLUS FLO-VU LARGE MISC 1 each  1 each Other Once [place], Justina A, NP          REVIEW OF SYSTEMS (Negative unless checked)  Constitutional: _0 Weight loss  _1 Fever  _2 Chills Cardiac: _3 Chest Calderon   _4 Chest pressure   _5 Palpitations   _6 Shortness of breath when laying flat   _7 Shortness of breath at rest   _8 Shortness of breath with exertion. Vascular:  _9 Calderon in legs with walking   _10 Calderon in legs at rest   _11 Calderon in legs when laying flat   _12 Claudication   _13 Calderon in feet when walking  _14 Calderon in feet at rest  _15 Calderon in feet when laying flat   _16 History of DVT   _17 Phlebitis   _18 Swelling in legs   _19 Varicose veins   _20 Non-healing ulcers Pulmonary:   _21 Uses home oxygen   _22 Productive cough   _23 Hemoptysis   _24 Wheeze  _25 COPD   _26 Asthma Neurologic:  _27 Dizziness  _28 Blackouts   _29 Seizures   _30 History of stroke   _31 History of TIA  _32 Aphasia   _33 Temporary blindness   _34 Dysphagia   _35 Weakness or numbness in arms   _36 Weakness or numbness in legs Musculoskeletal:  _37 Arthritis   _38 Joint swelling   _39 Joint Calderon   _40 Low back Calderon Hematologic:  _41 Easy bruising  _42 Easy bleeding   _43 Hypercoagulable state   _44 Anemic  _45 Hepatitis Gastrointestinal:  _46 Blood in stool   _47 Vomiting blood  _48 Gastroesophageal  reflux/heartburn   _49 Abdominal Calderon Genitourinary:  _50 Chronic kidney disease   _51 Difficult urination  _52 Frequent urination  _53 Burning with urination   _54 Hematuria Skin:  _55 Rashes   _56 Ulcers   _57 Wounds Psychological:  _58 History of anxiety   _59  History of major depression.    Physical Exam BP 134/85 (BP Location: Right Arm, Patient Position: Sitting)   Pulse 73  Resp 15   Ht _0  (1.88 m)   Wt 95.7 kg (211 lb)   BMI 27.09 kg/m  Gen:  WD/WN, NAD Head: [place]/AT, No temporalis wasting Ear/Nose/Throat: Hearing grossly intact, nares w/o erythema or drainage, oropharynx w/o Erythema/Exudate Eyes: Conjunctiva clear, sclera non-icteric  Neck: trachea midline.  No JVD.  Pulmonary:  Good air movement, respirations not labored, no use of accessory muscles Cardiac: RRR Vascular:  Vessel Right Left  Radial Palpable Palpable                          PT Palpable Palpable  DP Palpable Palpable    Musculoskeletal: M/S 5/5 throughout.  Extremities without ischemic changes.  No deformity or atrophy. No edema. Neurologic: Sensation grossly intact in extremities.  Symmetrical.  Speech is fluent. Motor exam as listed above. Psychiatric: Judgment intact, Mood & affect appropriate for pt's clinical situation. Dermatologic: No rashes or ulcers noted.  No cellulitis or open wounds.  Radiology No results found.  Labs Recent Results (from the past 2160 hour(s))  Ferritin     Status: None   Collection Time: 12/30/17  7:52 AM  Result Value Ref Range   Ferritin 24 24 - 336 ng/mL    Comment: Performed at [facility], [address] [place], [place] [address]  Iron and TIBC     Status: Abnormal   Collection Time: 12/30/17  7:52 AM  Result Value Ref Range   Iron 100 45 - 182 ug/dL   TIBC 513 (H) 250 - 450 ug/dL   Saturation Ratios 20 17.9 - 39.5 %   UIBC 413 ug/dL    Comment: Performed at [facility], [address] [place], [place] [address]  B12     Status: Abnormal    Collection Time: 12/30/17  7:52 AM  Result Value Ref Range   Vitamin B-12 939 (H) 180 - 914 pg/mL    Comment: (NOTE) This assay is not validated for testing neonatal or myeloproliferative syndrome specimens for Vitamin B12 levels. Performed at [facility], [place] [address], [place], [place] [address]   CBC w/Diff     Status: None   Collection Time: 12/30/17  7:52 AM  Result Value Ref Range   WBC 6.8 3.8 - 10.6 K/uL   RBC 4.96 4.40 - 5.90 MIL/uL   Hemoglobin 14.5 13.0 - 18.0 g/dL   HCT 43.0 40.0 - 52.0 %   MCV 86.7 80.0 - 100.0 fL   MCH 29.3 26.0 - 34.0 pg   MCHC 33.8 32.0 - 36.0 g/dL   RDW 14.5 11.5 - 14.5 %   Platelets 218 150 - 440 K/uL   Neutrophils Relative % 66 %   Neutro Abs 4.5 1.4 - 6.5 K/uL   Lymphocytes Relative 19 %   Lymphs Abs 1.3 1.0 - 3.6 K/uL   Monocytes Relative 8 %   Monocytes Absolute 0.5 0.2 - 1.0 K/uL   Eosinophils Relative 6 %   Eosinophils Absolute 0.4 0 - 0.7 K/uL   Basophils Relative 1 %   Basophils Absolute 0.1 0 - 0.1 K/uL    Comment: Performed at [facility], [address] [place],  [address]  Comp Met (CMET)     Status: Abnormal   Collection Time: 12/30/17  7:52 AM  Result Value Ref Range   Sodium 138 135 - 145 mmol/L   Potassium 4.1 3.5 - 5.1 mmol/L   Chloride 105 101 - 111 mmol/L  CO2 23 22 - 32 mmol/L   Glucose, Bld 88 65 - 99 mg/dL   BUN 21 (H) 6 - 20 mg/dL   Creatinine, Ser 1.13 0.61 - 1.24 mg/dL   Calcium 9.6 8.9 - 10.3 mg/dL   Total Protein 7.7 6.5 - 8.1 g/dL   Albumin 4.8 3.5 - 5.0 g/dL   AST 69 (H) 15 - 41 U/L   ALT 107 (H) 17 - 63 U/L   Alkaline Phosphatase 95 38 - 126 U/L   Total Bilirubin 0.8 0.3 - 1.2 mg/dL   GFR calc non Af Amer >60 >60 mL/min   GFR calc Af Amer >60 >60 mL/min    Comment: (NOTE) The eGFR has been calculated using the CKD EPI equation. This calculation has not been validated in all clinical situations. eGFR's persistently <60 mL/min signify possible Chronic Kidney Disease.     Anion gap 10 5 - 15    Comment: Performed at [facility], [address] [place], [place] [address]  Cortisol     Status: None   Collection Time: 12/30/17  7:52 AM  Result Value Ref Range   Cortisol, Plasma 10.3 ug/dL    Comment: (NOTE) AM    6.7 - 22.6 ug/dL PM   <10.0       ug/dL Performed at [facility] [address], [place], [place] [address]   Rapid Influenza A&B Antigens [facility] only)     Status: None   Collection Time: 01/17/18 11:38 AM  Result Value Ref Range   Influenza A ([facility]) NEGATIVE NEGATIVE   Influenza B [facility]) NEGATIVE NEGATIVE    Comment: Performed at [facility], [address], [place], [place] [address]  POCT rapid strep A     Status: Normal   Collection Time: 01/20/18  2:06 PM  Result Value Ref Range   Rapid Strep A Screen Negative Negative  PSA     Status: None   Collection Time: 02/01/18  5:16 PM  Result Value Ref Range   Prostate Specific Ag, Serum 0.2 0.0 - 4.0 ng/mL    Comment: Roche ECLIA methodology. According to the American Urological Association, Serum PSA should decrease and remain at undetectable levels after radical prostatectomy. The AUA defines biochemical recurrence as an initial PSA value 0.2 ng/mL or greater followed by a subsequent confirmatory PSA value 0.2 ng/mL or greater. Values obtained with different assay methods or kits cannot be used interchangeably. Results cannot be interpreted as absolute evidence of the presence or absence of malignant disease.   Hemoglobin and Hematocrit, Blood     Status: None   Collection Time: 02/01/18  5:16 PM  Result Value Ref Range   Hemoglobin 13.3 13.0 - 17.7 g/dL   Hematocrit 38.4 37.5 - 51.0 %  Testosterone     Status: Abnormal   Collection Time: 02/01/18  5:16 PM  Result Value Ref Range   Testosterone 209 (L) 264 - 916 ng/dL    Comment: Adult male reference interval is based on a population of healthy nonobese males (BMI <30) between 65 and 61 years  old. [place], [place] [phone]. PMID: 85929244.     Assessment/Plan:  Hypertension blood pressure control important in reducing the progression of atherosclerotic disease. On appropriate oral medications.   Thoracic outlet syndrome S/p surgery for this  Calderon in limb To evaluate his lower extremity symptoms noninvasive arterial studies were performed today.  These demonstrate brisk triphasic waveforms with normal ABIs and normal digital pressures bilaterally consistent  with no arterial insufficiency. Given his symptoms, with normal arterial studies the most likely cause would be neurogenic claudication.  He may benefit from an MRI of the back or other evaluation including nerve conduction studies.  I will defer this workup to his primary care physician going forward.  I will see him back as needed.      Daniel Calderon 02/19/2018, 1:50 PM   This note was created with Dragon medical transcription system.  Any errors from dictation are unintentional.

## 2018-02-19 NOTE — Assessment & Plan Note (Signed)
To evaluate his lower extremity symptoms noninvasive arterial studies were performed today.  These demonstrate brisk triphasic waveforms with normal ABIs and normal digital pressures bilaterally consistent with no arterial insufficiency. Given his symptoms, with normal arterial studies the most likely cause would be neurogenic claudication.  He may benefit from an MRI of the back or other evaluation including nerve conduction studies.  I will defer this workup to his primary care physician going forward.  I will see him back as needed.

## 2018-02-19 NOTE — Assessment & Plan Note (Signed)
S/p surgery for this

## 2018-02-27 ENCOUNTER — Telehealth: Payer: Self-pay | Admitting: Family Medicine

## 2018-02-27 DIAGNOSIS — G9519 Other vascular myelopathies: Secondary | ICD-10-CM

## 2018-02-27 DIAGNOSIS — M48062 Spinal stenosis, lumbar region with neurogenic claudication: Secondary | ICD-10-CM

## 2018-02-27 NOTE — Telephone Encounter (Signed)
-----   Message from Algernon Huxley, MD sent at 02/19/2018  1:51 PM EST ----- His arterial studies were normal.  Suspect neurogenic claudication at this point.  Will defer to you whether or not to start with MRI or nerve conduction studies going forward

## 2018-02-27 NOTE — Telephone Encounter (Signed)
Please let the patient know that we heard from vascular surgery regarding his arterial studies being normal.  They suspect neurogenic claudication as a cause for his leg pain.  This could come from his low back if he were to have degenerative changes.  The next step would be an MRI of his lumbar spine.  We could have him return to the office to discuss this further or we could proceed with MRI lumbar spine.  Please see what he would like to do.  Thanks.

## 2018-03-01 ENCOUNTER — Other Ambulatory Visit: Payer: No Typology Code available for payment source

## 2018-03-01 DIAGNOSIS — E291 Testicular hypofunction: Secondary | ICD-10-CM

## 2018-03-01 NOTE — Telephone Encounter (Signed)
patient notified and states he does have some pictures from [facility] where they went into his spine and he will be sending these through [facility]. Patient would like MRI, he does not have any metal and no pacemaker.

## 2018-03-01 NOTE — Telephone Encounter (Signed)
Noted.  I am happy to order the MRI.  Please see if he has any back pain or pain radiating down his legs from his back.  Please see if he has any other symptoms other than leg pain.  I need to know this so that I can have an appropriate diagnosis to ensure this is covered by his insurance.

## 2018-03-02 ENCOUNTER — Encounter: Payer: Self-pay | Admitting: Family Medicine

## 2018-03-02 LAB — HEPATIC FUNCTION PANEL
ALBUMIN: 4.7 g/dL (ref 3.5–5.5)
ALT: 100 IU/L — ABNORMAL HIGH (ref 0–44)
AST: 59 IU/L — ABNORMAL HIGH (ref 0–40)
Alkaline Phosphatase: 91 IU/L (ref 39–117)
BILIRUBIN TOTAL: 0.3 mg/dL (ref 0.0–1.2)
Bilirubin, Direct: 0.11 mg/dL (ref 0.00–0.40)
TOTAL PROTEIN: 6.8 g/dL (ref 6.0–8.5)

## 2018-03-02 LAB — TESTOSTERONE: Testosterone: 305 ng/dL (ref 264–916)

## 2018-03-02 LAB — HEMOGLOBIN: HEMOGLOBIN: 14.8 g/dL (ref 13.0–17.7)

## 2018-03-02 LAB — HEMATOCRIT: HEMATOCRIT: 42.3 % (ref 37.5–51.0)

## 2018-03-02 NOTE — Telephone Encounter (Signed)
Left message to return call, ok for pec to speak to patient and ask questions below form Dr.Sonnenberg  Noted.  I am happy to order the MRI.  Please see if he has any back pain or pain radiating down his legs from his back.  Please see if he has any other symptoms other than leg pain.  I need to know this so that I can have an appropriate diagnosis to ensure this is covered by his insurance.

## 2018-03-02 NOTE — Telephone Encounter (Signed)
Ordered

## 2018-03-02 NOTE — Telephone Encounter (Signed)
Pt. Reports he has "more numbness than pain " in his low back. States the numbness affects both legs. Reports after walking short distances his legs "will feel like Jello and be really weak." States that when he sits he has to lean forward to relieve pressure off his back. Please call him when MRI is scheduled.

## 2018-03-02 NOTE — Addendum Note (Signed)
Addended by: Caryl Bis ERIC G on: 03/02/2018 12:43 PM   Modules accepted: Orders

## 2018-03-02 NOTE — Telephone Encounter (Signed)
fyi

## 2018-03-04 ENCOUNTER — Telehealth: Payer: Self-pay

## 2018-03-04 NOTE — Telephone Encounter (Signed)
Pt testosterone was drawn also. Hct, hgb, LFT and testosterone labs were drawn on 03/01/18.

## 2018-03-10 ENCOUNTER — Ambulatory Visit
Admission: RE | Admit: 2018-03-10 | Discharge: 2018-03-10 | Disposition: A | Discharge status: Home / Self Care | Payer: No Typology Code available for payment source | Source: Ambulatory Visit | Attending: Family Medicine | Admitting: Family Medicine

## 2018-03-10 DIAGNOSIS — M48062 Spinal stenosis, lumbar region with neurogenic claudication: Secondary | ICD-10-CM | POA: Diagnosis present

## 2018-03-10 DIAGNOSIS — M48061 Spinal stenosis, lumbar region without neurogenic claudication: Secondary | ICD-10-CM | POA: Insufficient documentation

## 2018-03-10 DIAGNOSIS — M5137 Other intervertebral disc degeneration, lumbosacral region: Secondary | ICD-10-CM | POA: Diagnosis not present

## 2018-03-10 DIAGNOSIS — M5126 Other intervertebral disc displacement, lumbar region: Secondary | ICD-10-CM | POA: Diagnosis not present

## 2018-03-10 DIAGNOSIS — G9519 Other vascular myelopathies: Secondary | ICD-10-CM

## 2018-03-16 NOTE — Progress Notes (Signed)
Left voicemail for patient to call the office. [place] for [facility] to give message in chart.

## 2018-03-28 NOTE — Telephone Encounter (Signed)
Patient was to follow up after one month of being on the AndroGel with Dr. Pilar Jarvis.  He will need to reschedule that appointment if he wants to continue the therapy.

## 2018-03-29 NOTE — Telephone Encounter (Signed)
Spoke w/ pt, can you please call and schedule w/ [facility] per Larene Beach for androgel therapy.

## 2018-03-31 NOTE — Telephone Encounter (Signed)
Appointment made  [facility]

## 2018-04-12 ENCOUNTER — Ambulatory Visit: Payer: No Typology Code available for payment source

## 2018-04-13 ENCOUNTER — Telehealth: Payer: Self-pay | Admitting: Urology

## 2018-04-13 MED ORDER — TESTOSTERONE 50 MG/5GM (1%) TD GEL: 5.0000 g | Freq: Every day | TRANSDERMAL | 0 refills | Status: DC

## 2018-04-13 NOTE — Telephone Encounter (Signed)
Had to change due to provider coverage, patient asking for refill of Androgel until he can be seen at his next appt. Please Advise. Thanks.

## 2018-04-13 NOTE — Addendum Note (Signed)
Addended by: Abbie Sons on: 04/13/2018 01:16 PM   Modules accepted: Orders

## 2018-04-13 NOTE — Telephone Encounter (Signed)
Rx was sent  

## 2018-04-15 ENCOUNTER — Ambulatory Visit: Payer: No Typology Code available for payment source

## 2018-04-16 ENCOUNTER — Other Ambulatory Visit: Payer: Self-pay

## 2018-04-16 ENCOUNTER — Ambulatory Visit (INDEPENDENT_AMBULATORY_CARE_PROVIDER_SITE_OTHER): Payer: No Typology Code available for payment source | Admitting: Family Medicine

## 2018-04-16 ENCOUNTER — Ambulatory Visit (INDEPENDENT_AMBULATORY_CARE_PROVIDER_SITE_OTHER): Payer: No Typology Code available for payment source

## 2018-04-16 ENCOUNTER — Encounter: Payer: Self-pay | Admitting: Family Medicine

## 2018-04-16 VITALS — BP 118/70 | HR 80 | Temp 97.8°F | Ht 74.0 in | Wt 215.4 lb

## 2018-04-16 DIAGNOSIS — L409 Psoriasis, unspecified: Secondary | ICD-10-CM

## 2018-04-16 DIAGNOSIS — R600 Localized edema: Secondary | ICD-10-CM

## 2018-04-16 DIAGNOSIS — F4323 Adjustment disorder with mixed anxiety and depressed mood: Secondary | ICD-10-CM

## 2018-04-16 DIAGNOSIS — M25562 Pain in left knee: Secondary | ICD-10-CM

## 2018-04-16 DIAGNOSIS — L918 Other hypertrophic disorders of the skin: Secondary | ICD-10-CM

## 2018-04-16 NOTE — Patient Instructions (Signed)
Nice to see you. We will get you to see dermatology. Please elevate your legs. We will do an x-ray of her left knee as well as have you see Ortho.

## 2018-04-18 DIAGNOSIS — R600 Localized edema: Secondary | ICD-10-CM | POA: Insufficient documentation

## 2018-04-18 DIAGNOSIS — M25562 Pain in left knee: Secondary | ICD-10-CM | POA: Insufficient documentation

## 2018-04-18 DIAGNOSIS — L918 Other hypertrophic disorders of the skin: Secondary | ICD-10-CM | POA: Insufficient documentation

## 2018-04-18 NOTE — Assessment & Plan Note (Signed)
Currently asymptomatic.  Continue Zoloft.

## 2018-04-18 NOTE — Assessment & Plan Note (Signed)
Refer to dermatology 

## 2018-04-18 NOTE — Assessment & Plan Note (Signed)
Concerning for meniscal injury.  Will obtain an x-ray.  Consider referral to orthopedics.

## 2018-04-18 NOTE — Progress Notes (Signed)
Tommi Rumps, MD Phone: [phone]  Daniel Calderon is a 52 y.o. male who presents today for f/u.  Skin tags: Patient notes a skin tag right inner thigh near his groin as well as a couple over his face with one on his right eyelid and one right below his right eyelid.  Those are irritating.  He also has psoriasis with a plaque on his right knee.  Left knee pain: He notes starting 2 weeks ago he developed this.  No swelling.  Does not give out.  Occasionally locks.  No popping.  Is fairly severe at times.  Has gotten worse.  He has iced it.  He stopped exercising.  Some ibuprofen.  Bilateral ankle edema: Minimal trace lower extremity edema.  He props his legs up.  He notes this has been chronically going on since prior to his cardiac evaluation last year.  He notes no PND.  He has chronic stable dyspnea that has been evaluated.  He sleeps on 2 pillows and has not had to increase that.  He notes no anxiety or depression.  He remains on Zoloft.  He does note he feels scatterbrained at times.  Artie follows with neurology and has seen neuropsychology.  Social History   Tobacco Use  Smoking Status Never Smoker  Smokeless Tobacco Never Used     ROS see history of present illness  Objective  Physical Exam Vitals:   04/16/18 1517  BP: 118/70  Pulse: 80  Temp: 97.8 F (36.6 C)  SpO2: 96%    BP Readings from Last 3 Encounters:  04/16/18 118/70  02/19/18 134/85  02/11/18 122/84   Wt Readings from Last 3 Encounters:  04/16/18 215 lb 6.4 oz (97.7 kg)  02/19/18 211 lb (95.7 kg)  02/11/18 214 lb 2 oz (97.1 kg)    Physical Exam  Constitutional: No distress.  Cardiovascular: Normal rate, regular rhythm and normal heart sounds.  Pulmonary/Chest: Effort normal and breath sounds normal.  Musculoskeletal: He exhibits edema (Minimal pedal).  Left knee with medial joint line tenderness, no swelling or effusion, no warmth or erythema, no ligamentous laxity, negative McMurray's    Neurological: He is alert.  Skin: Skin is warm and dry. He is not diaphoretic.  Skin tag right inner thigh near groin, nontender, no signs of infection, small skin tag on right upper eyelid and right below his left lower eyelid psoriatic plaque on his right knee     Assessment/Plan: Please see individual problem list.  Psoriasis Refer to dermatology.  Cutaneous skin tags Refer to dermatology.  Acute pain of left knee Concerning for meniscal injury.  Will obtain an x-ray.  Consider referral to orthopedics.  Lower extremity edema Lateral in nature.  Trace edema.  He had cardiac evaluation last year which did not reveal a cause that would contribute to edema.  The edema has been going on since prior to then.  He is on his feet all day and I suspect there is some measure of dependency contributing.  He will prop his legs up as much as possible.  Consider compression stockings.  He will monitor.  Adjustment disorder with mixed anxiety and depressed mood Currently asymptomatic.  Continue Zoloft.   Orders Placed This Encounter  Procedures  . DG Knee Complete 4 Views Left    Standing Status:   Future    Number of Occurrences:   1    Standing Expiration Date:   06/17/2019    Order Specific Question:   Reason for Exam (  SYMPTOM  OR DIAGNOSIS REQUIRED)    Answer:   acute left knee pain, no injury, medial joint line tenderness    Order Specific Question:   Preferred imaging location?    Answer:   [facility] Specific Question:   Radiology Contrast Protocol - do NOT remove file path    Answer:   [email or web address]  . Ambulatory referral to Orthopedic Surgery    Referral Priority:   Routine    Referral Type:   Surgical    Referral Reason:   Specialty Services Required    Requested Specialty:   Orthopedic Surgery    Number of Visits Requested:   1  . Ambulatory referral to Dermatology    Referral Priority:   Routine     Referral Type:   Consultation    Referral Reason:   Specialty Services Required    Requested Specialty:   Dermatology    Number of Visits Requested:   1    No orders of the defined types were placed in this encounter.    Tommi Rumps, MD [place]

## 2018-04-18 NOTE — Assessment & Plan Note (Signed)
Lateral in nature.  Trace edema.  He had cardiac evaluation last year which did not reveal a cause that would contribute to edema.  The edema has been going on since prior to then.  He is on his feet all day and I suspect there is some measure of dependency contributing.  He will prop his legs up as much as possible.  Consider compression stockings.  He will monitor.

## 2018-04-20 ENCOUNTER — Encounter: Payer: Self-pay | Admitting: Family Medicine

## 2018-04-20 DIAGNOSIS — M25562 Pain in left knee: Secondary | ICD-10-CM

## 2018-04-21 ENCOUNTER — Encounter (INDEPENDENT_AMBULATORY_CARE_PROVIDER_SITE_OTHER): Payer: Self-pay

## 2018-04-23 ENCOUNTER — Ambulatory Visit: Payer: Self-pay | Admitting: Neurology

## 2018-04-29 ENCOUNTER — Telehealth: Payer: No Typology Code available for payment source | Admitting: Physician Assistant

## 2018-04-29 ENCOUNTER — Encounter: Payer: Self-pay | Admitting: Family Medicine

## 2018-04-29 DIAGNOSIS — J208 Acute bronchitis due to other specified organisms: Secondary | ICD-10-CM

## 2018-04-29 MED ORDER — BENZONATATE 100 MG PO CAPS: 100.0000 mg | ORAL_CAPSULE | Freq: Three times a day (TID) | ORAL | 0 refills | Status: DC | PRN

## 2018-04-29 NOTE — Progress Notes (Signed)

## 2018-05-02 ENCOUNTER — Telehealth: Payer: No Typology Code available for payment source | Admitting: Family

## 2018-05-02 DIAGNOSIS — B9689 Other specified bacterial agents as the cause of diseases classified elsewhere: Secondary | ICD-10-CM

## 2018-05-02 DIAGNOSIS — J208 Acute bronchitis due to other specified organisms: Secondary | ICD-10-CM | POA: Diagnosis not present

## 2018-05-02 MED ORDER — AZITHROMYCIN 250 MG PO TABS
ORAL_TABLET | ORAL | 0 refills | Status: DC
Start: 2018-05-02 — End: 2018-07-03

## 2018-05-02 NOTE — Progress Notes (Signed)
We are sorry that you are not feeling well.  Here is how we plan to help!  Based on your presentation I believe you most likely have A cough due to bacteria.  When patients have a fever and a productive cough with a change in color or increased sputum production, we are concerned about bacterial bronchitis.  If left untreated it can progress to pneumonia.  If your symptoms do not improve with your treatment plan it is important that you contact your provider.   I have prescribed Azithromyin 250 mg: two tablets now and then one tablet daily for 4 additonal days    In addition you may use A non-prescription cough medication called Robitussin DAC. Take 2 teaspoons every 8 hours or Delsym: take 2 teaspoons every 12 hours. and A non-prescription cough medication called Mucinex DM: take 2 tablets every 12 hours.    From your responses in the eVisit questionnaire you describe inflammation in the upper respiratory tract which is causing a significant cough.  This is commonly called Bronchitis and has four common causes:    Allergies  Viral Infections  Acid Reflux  Bacterial Infection Allergies, viruses and acid reflux are treated by controlling symptoms or eliminating the cause. An example might be a cough caused by taking certain blood pressure medications. You stop the cough by changing the medication. Another example might be a cough caused by acid reflux. Controlling the reflux helps control the cough.  USE OF BRONCHODILATOR ("RESCUE") INHALERS: There is a risk from using your bronchodilator too frequently.  The risk is that over-reliance on a medication which only relaxes the muscles surrounding the breathing tubes can reduce the effectiveness of medications prescribed to reduce swelling and congestion of the tubes themselves.  Although you feel brief relief from the bronchodilator inhaler, your asthma may actually be worsening with the tubes becoming more swollen and filled with mucus.  This can  delay other crucial treatments, such as oral steroid medications. If you need to use a bronchodilator inhaler daily, several times per day, you should discuss this with your provider.  There are probably better treatments that could be used to keep your asthma under control.     HOME CARE . Only take medications as instructed by your medical team. . Complete the entire course of an antibiotic. . Drink plenty of fluids and get plenty of rest. . Avoid close contacts especially the very young and the elderly . Cover your mouth if you cough or cough into your sleeve. . Always remember to wash your hands . A steam or ultrasonic humidifier can help congestion.   GET HELP RIGHT AWAY IF: . You develop worsening fever. . You become short of breath . You cough up blood. . Your symptoms persist after you have completed your treatment plan MAKE SURE YOU   Understand these instructions.  Will watch your condition.  Will get help right away if you are not doing well or get worse.  Your e-visit answers were reviewed by a board certified advanced clinical practitioner to complete your personal care plan.  Depending on the condition, your plan could have included both over the counter or prescription medications. If there is a problem please reply  once you have received a response from your provider. Your safety is important to [name].  If you have drug allergies check your prescription carefully.    You can use MyChart to ask questions about today's visit, request a non-urgent call back, or ask for a  work or school excuse for 24 hours related to this e-Visit. If it has been greater than 24 hours you will need to follow up with your provider, or enter a new e-Visit to address those concerns. You will get an e-mail in the next two days asking about your experience.  I hope that your e-visit has been valuable and will speed your recovery. Thank you for using e-visits.   

## 2018-05-04 ENCOUNTER — Other Ambulatory Visit: Payer: Self-pay | Admitting: Family Medicine

## 2018-05-11 ENCOUNTER — Encounter: Payer: Self-pay | Admitting: Family Medicine

## 2018-05-18 ENCOUNTER — Encounter: Payer: Self-pay | Admitting: Urology

## 2018-05-18 ENCOUNTER — Ambulatory Visit (INDEPENDENT_AMBULATORY_CARE_PROVIDER_SITE_OTHER): Payer: No Typology Code available for payment source | Admitting: Urology

## 2018-05-18 VITALS — BP 129/86 | HR 73 | Ht 74.0 in | Wt 211.0 lb

## 2018-05-18 DIAGNOSIS — E291 Testicular hypofunction: Secondary | ICD-10-CM | POA: Diagnosis not present

## 2018-05-18 NOTE — Progress Notes (Signed)
05/18/2018 9:31 AM   Daniel Calderon 1966/06/17 [id number]  Referring provider: [name], MD [address] [place], [place] [address]  Chief Complaint  Patient presents with  . Hypogonadism    HPI: 52 year old male presents for follow-up of hypogonadism.  He has been on various forms of testosterone replacement therapy including patches, chills, injections and Testopel.  He saw Dr. Pilar Jarvis in January 2019 and was restarted on AndroGel.  There were some problems with prior authorization and he has only been using this medication continuously for the past 6 weeks.  He has noted significant improvement in his energy level and also sees improvement in his libido.  He thinks it works much better than when he was previously on a topical gel.  He denies breast tenderness/enlargement or lower extremity edema.  He has no voiding complaints.   PMH: Past Medical History:  Diagnosis Date  . Allergy    Seasonal  . Anxiety   . Benign enlargement of prostate   . Depression   . Elevated BP   . Elevated transaminase level   . Failure of erection   . Fatigue   . Frequent headaches   . Hypogonadism in male   . Kidney stones    History of kidney stones  . Migraine   . Sleep apnea    Currently uses cpap  . Thoracic outlet syndrome    Left Arm    Surgical History: Past Surgical History:  Procedure Laterality Date  . COLONOSCOPY WITH PROPOFOL N/A 11/11/2017   Procedure: COLONOSCOPY WITH PROPOFOL;  Surgeon: Lin Landsman, MD;  Location: [facility];  Service: Endoscopy;  Laterality: N/A;  . Left Shoulder Surgery  2011  . nerve block     2 in neck. 5 in back.  . NOSE SURGERY    . POLYPECTOMY  11/11/2017   Procedure: POLYPECTOMY;  Surgeon: Lin Landsman, MD;  Location: [facility];  Service: Endoscopy;;  . SCALENE NODE BIOPSY / EXCISION    . VASECTOMY      Home Medications:  Allergies as of 05/18/2018      Reactions   Erythromycin  Nausea And Vomiting   Cephalexin    Other reaction(s): Other (See Comments) Other Reaction: Other reaction   Dexamethasone    Other reaction(s): Other (See Comments) Other Reaction: Other reaction   Oxycodone-acetaminophen    Other reaction(s): Other (See Comments) Other Reaction: Other reaction   Oxycodone-acetaminophen    Other reaction(s): Other (See Comments) Other Reaction: Other reaction   Prednisone    Other reaction(s): Other (See Comments) Other Reaction: Other reaction   Gabapentin Other (See Comments)   Aggressive   Ibuprofen Itching   Can take in low doses per pt      Medication List        Accurate as of 05/18/18  9:31 AM. Always use your most recent med list.          amLODipine 10 MG tablet Commonly known as:  NORVASC Take 1 tablet (10 mg total) by mouth daily.   azithromycin 250 MG tablet Commonly known as:  ZITHROMAX Take 500 mg once, then 250 mg for four days   benzonatate 100 MG capsule Commonly known as:  TESSALON Take 1 capsule (100 mg total) by mouth 3 (three) times daily as needed for cough.   fluticasone 50 MCG/ACT nasal spray Commonly known as:  FLONASE Place 2 sprays into both nostrils daily.   ibuprofen 600 MG tablet Commonly  known as:  ADVIL,MOTRIN Take 1 tablet (600 mg total) by mouth every 6 (six) hours as needed.   IRON (FERROUS SULFATE) PO Take by mouth.   magnesium oxide 400 MG tablet Commonly known as:  MAG-OX Take 400 mg by mouth daily.   multivitamin tablet Take 1 tablet by mouth daily.   omeprazole 20 MG capsule Commonly known as:  PRILOSEC TAKE 1 CAPSULE BY MOUTH DAILY   sertraline 100 MG tablet Commonly known as:  ZOLOFT TAKE 1 TABLET BY MOUTH ONCE DAILY   testosterone 50 MG/5GM (1%) Gel Commonly known as:  ANDROGEL Place 5 g onto the skin daily.   Testosterone 12.5 MG/ACT (1%) Gel   VITAMIN B 12 PO Take by mouth.       Allergies:  Allergies  Allergen Reactions  . Erythromycin Nausea And Vomiting    . Cephalexin     Other reaction(s): Other (See Comments) Other Reaction: Other reaction  . Dexamethasone     Other reaction(s): Other (See Comments) Other Reaction: Other reaction  . Oxycodone-Acetaminophen     Other reaction(s): Other (See Comments) Other Reaction: Other reaction  . Oxycodone-Acetaminophen     Other reaction(s): Other (See Comments) Other Reaction: Other reaction  . Prednisone     Other reaction(s): Other (See Comments) Other Reaction: Other reaction  . Gabapentin Other (See Comments)    Aggressive  . Ibuprofen Itching    Can take in low doses per pt    Family History: Family History  Problem Relation Age of Onset  . Hypertension Mother        Living  . Hearing loss Mother   . Heart disease Other   . Healthy Father        Living  . Diabetes Brother   . Stroke Paternal Uncle   . Heart attack Paternal Uncle   . Hearing loss Maternal Grandmother   . Healthy Son   . Healthy Daughter   . Kidney cancer Neg Hx   . Bladder Cancer Neg Hx   . Prostate cancer Neg Hx     Social History:  reports that he has never smoked. He has never used smokeless tobacco. He reports that he drinks about 1.8 oz of alcohol per week. He reports that he does not use drugs.  ROS: UROLOGY Frequent Urination?: No Hard to postpone urination?: No Burning/pain with urination?: No Get up at night to urinate?: No Leakage of urine?: No Urine stream starts and stops?: No Trouble starting stream?: No Do you have to strain to urinate?: No Blood in urine?: No Urinary tract infection?: No Sexually transmitted disease?: No Injury to kidneys or bladder?: No Painful intercourse?: No Weak stream?: No Erection problems?: Yes Penile pain?: No  Gastrointestinal Nausea?: No Vomiting?: No Indigestion/heartburn?: No Diarrhea?: No Constipation?: No  Constitutional Fever: No Night sweats?: Yes Weight loss?: No Fatigue?: Yes  Skin Skin rash/lesions?: No Itching?:  No  Eyes Blurred vision?: No Double vision?: No  Ears/Nose/Throat Sore throat?: No Sinus problems?: No  Hematologic/Lymphatic Swollen glands?: No Easy bruising?: No  Cardiovascular Leg swelling?: No Chest pain?: No  Respiratory Cough?: No Shortness of breath?: No  Endocrine Excessive thirst?: No  Musculoskeletal Back pain?: No Joint pain?: No  Neurological Headaches?: No Dizziness?: No  Psychologic Depression?: No Anxiety?: No  Physical Exam: BP 129/86 (BP Location: Left Arm, Patient Position: Sitting, Cuff Size: Large)   Pulse 73   Ht 6\' 2"  (1.88 m)   Wt 211 lb (95.7 kg)   SpO2 99%  BMI 27.09 kg/m   Constitutional:  Alert and oriented, No acute distress.   Assessment & Plan:   Doing well on TRT with AndroGel.  A testosterone level was ordered today and he will be notified with results.  Follow-up in 3-4 months for monitoring lab work including PSA, hematocrit and DRE.   Abbie Sons, [place] [address] [place], [place] [address] [phone]

## 2018-05-19 ENCOUNTER — Telehealth: Payer: Self-pay

## 2018-05-19 LAB — TESTOSTERONE: Testosterone: 335 ng/dL (ref 264–916)

## 2018-05-19 NOTE — Telephone Encounter (Signed)
-----   Message from Abbie Sons, MD sent at 05/19/2018  7:31 AM EDT ----- Testosterone level was 335.  This is within the normal range and since his symptoms have improved would continue the present dose.

## 2018-05-19 NOTE — Telephone Encounter (Signed)
Pt informed

## 2018-05-25 ENCOUNTER — Ambulatory Visit (INDEPENDENT_AMBULATORY_CARE_PROVIDER_SITE_OTHER): Payer: Self-pay | Admitting: Family Medicine

## 2018-05-25 ENCOUNTER — Emergency Department: Payer: No Typology Code available for payment source

## 2018-05-25 ENCOUNTER — Encounter: Payer: Self-pay | Admitting: Family Medicine

## 2018-05-25 ENCOUNTER — Other Ambulatory Visit: Payer: Self-pay

## 2018-05-25 ENCOUNTER — Emergency Department
Admission: EM | Admit: 2018-05-25 | Discharge: 2018-05-25 | Disposition: A | Discharge status: Home / Self Care | Payer: No Typology Code available for payment source | Attending: Emergency Medicine | Admitting: Emergency Medicine

## 2018-05-25 VITALS — BP 140/96 | HR 70 | Temp 98.2°F | Wt 210.0 lb

## 2018-05-25 DIAGNOSIS — R200 Anesthesia of skin: Secondary | ICD-10-CM | POA: Diagnosis not present

## 2018-05-25 DIAGNOSIS — I10 Essential (primary) hypertension: Secondary | ICD-10-CM | POA: Insufficient documentation

## 2018-05-25 DIAGNOSIS — J0100 Acute maxillary sinusitis, unspecified: Secondary | ICD-10-CM | POA: Diagnosis not present

## 2018-05-25 DIAGNOSIS — Z79899 Other long term (current) drug therapy: Secondary | ICD-10-CM | POA: Diagnosis not present

## 2018-05-25 DIAGNOSIS — R42 Dizziness and giddiness: Secondary | ICD-10-CM | POA: Diagnosis not present

## 2018-05-25 DIAGNOSIS — H539 Unspecified visual disturbance: Secondary | ICD-10-CM

## 2018-05-25 DIAGNOSIS — H5789 Other specified disorders of eye and adnexa: Secondary | ICD-10-CM

## 2018-05-25 LAB — COMPREHENSIVE METABOLIC PANEL
ALK PHOS: 80 U/L (ref 38–126)
ALT: 132 U/L — AB (ref 17–63)
AST: 84 U/L — AB (ref 15–41)
Albumin: 5.1 g/dL — ABNORMAL HIGH (ref 3.5–5.0)
Anion gap: 8 (ref 5–15)
BUN: 18 mg/dL (ref 6–20)
CALCIUM: 9.3 mg/dL (ref 8.9–10.3)
CHLORIDE: 106 mmol/L (ref 101–111)
CO2: 24 mmol/L (ref 22–32)
CREATININE: 1.11 mg/dL (ref 0.61–1.24)
GFR calc Af Amer: >60 mL/min (ref >60)
GFR calc non Af Amer: >60 mL/min (ref >60)
Glucose, Bld: 102 mg/dL — ABNORMAL HIGH (ref 65–99)
Potassium: 3.8 mmol/L (ref 3.5–5.1)
SODIUM: 138 mmol/L (ref 135–145)
TOTAL PROTEIN: 7.5 g/dL (ref 6.5–8.1)
Total Bilirubin: 0.8 mg/dL (ref 0.3–1.2)

## 2018-05-25 LAB — CBC
HEMATOCRIT: 43.5 % (ref 40.0–52.0)
Hemoglobin: 15.2 g/dL (ref 13.0–18.0)
MCH: 30.8 pg (ref 26.0–34.0)
MCHC: 34.9 g/dL (ref 32.0–36.0)
MCV: 88.1 fL (ref 80.0–100.0)
Platelets: 159 10*3/uL (ref 150–440)
RBC: 4.94 MIL/uL (ref 4.40–5.90)
RDW: 13.6 % (ref 11.5–14.5)
WBC: 5.6 10*3/uL (ref 3.8–10.6)

## 2018-05-25 LAB — ETHANOL

## 2018-05-25 LAB — DIFFERENTIAL
BASOS ABS: 0.1 10*3/uL (ref 0–0.1)
BASOS PCT: 1 %
Eosinophils Absolute: 0.2 10*3/uL (ref 0–0.7)
Eosinophils Relative: 4 %
LYMPHS PCT: 21 %
Lymphs Abs: 1.2 10*3/uL (ref 1.0–3.6)
MONOS PCT: 8 %
Monocytes Absolute: 0.4 10*3/uL (ref 0.2–1.0)
Neutro Abs: 3.7 10*3/uL (ref 1.4–6.5)
Neutrophils Relative %: 66 %

## 2018-05-25 LAB — PROTIME-INR
INR: 0.97
Prothrombin Time: 12.8 seconds (ref 11.4–15.2)

## 2018-05-25 LAB — APTT: APTT: 28 s (ref 24–36)

## 2018-05-25 LAB — GLUCOSE, CAPILLARY: GLUCOSE-CAPILLARY: 86 mg/dL (ref 65–99)

## 2018-05-25 LAB — TROPONIN I

## 2018-05-25 MED ORDER — AMOXICILLIN-POT CLAVULANATE 875-125 MG PO TABS: 1.0000 | ORAL_TABLET | Freq: Two times a day (BID) | ORAL | 0 refills | Status: AC

## 2018-05-25 NOTE — Patient Instructions (Addendum)
Your symptoms today are not related to any form of upper respiratory or eye problems. Dizziness and visual problems are concerning and could be the initial sign of an underlying neurological problem. In past you have had significant elevated liver enzymes which could also be contributing to your symptoms today.  I recommend immediately follow-up at [facility] Emergency Department.   Dizziness Dizziness is a common problem. It is a feeling of unsteadiness or light-headedness. You may feel like you are about to faint. Dizziness can lead to injury if you stumble or fall. Anyone can become dizzy, but dizziness is more common in older adults. This condition can be caused by a number of things, including medicines, dehydration, or illness. Follow these instructions at home: Eating and drinking  Drink enough fluid to keep your urine clear or pale yellow. This helps to keep you from becoming dehydrated. Try to drink more clear fluids, such as water.  Do not drink alcohol.  Limit your caffeine intake if told to do so by your health care provider. Check ingredients and nutrition facts to see if a food or beverage contains caffeine.  Limit your salt (sodium) intake if told to do so by your health care provider. Check ingredients and nutrition facts to see if a food or beverage contains sodium. Activity  Avoid making quick movements. ? Rise slowly from chairs and steady yourself until you feel okay. ? In the morning, first sit up on the side of the bed. When you feel okay, stand slowly while you hold onto something until you know that your balance is fine.  If you need to stand in one place for a long time, move your legs often. Tighten and relax the muscles in your legs while you are standing.  Do not drive or use heavy machinery if you feel dizzy.  Avoid bending down if you feel dizzy. Place items in your home so that they are easy for you to reach without leaning over. Lifestyle  Do not  use any products that contain nicotine or tobacco, such as cigarettes and e-cigarettes. If you need help quitting, ask your health care provider.  Try to reduce your stress level by using methods such as yoga or meditation. Talk with your health care provider if you need help to manage your stress. General instructions  Watch your dizziness for any changes.  Take over-the-counter and prescription medicines only as told by your health care provider. Talk with your health care provider if you think that your dizziness is caused by a medicine that you are taking.  Tell a friend or a family member that you are feeling dizzy. If he or she notices any changes in your behavior, have this person call your health care provider.  Keep all follow-up visits as told by your health care provider. This is important. Contact a health care provider if:  Your dizziness does not go away.  Your dizziness or light-headedness gets worse.  You feel nauseous.  You have reduced hearing.  You have new symptoms.  You are unsteady on your feet or you feel like the room is spinning. Get help right away if:  You vomit or have diarrhea and are unable to eat or drink anything.  You have problems talking, walking, swallowing, or using your arms, hands, or legs.  You feel generally weak.  You are not thinking clearly or you have trouble forming sentences. It may take a friend or family member to notice this.  You have chest  pain, abdominal pain, shortness of breath, or sweating.  Your vision changes.  You have any bleeding.  You have a severe headache.  You have neck pain or a stiff neck.  You have a fever. These symptoms may represent a serious problem that is an emergency. Do not wait to see if the symptoms will go away. Get medical help right away. Call your local emergency services (911 in the [place] Do not drive yourself to the hospital. Summary  Dizziness is a feeling of unsteadiness or  light-headedness. This condition can be caused by a number of things, including medicines, dehydration, or illness.  Anyone can become dizzy, but dizziness is more common in older adults.  Drink enough fluid to keep your urine clear or pale yellow. Do not drink alcohol.  Avoid making quick movements if you feel dizzy. Monitor your dizziness for any changes. This information is not intended to replace advice given to you by your health care provider. Make sure you discuss any questions you have with your health care provider. Document Released: 06/03/2001 Document Revised: 01/10/2017 Document Reviewed: 01/10/2017 Elsevier Interactive Patient Education  [employer].

## 2018-05-25 NOTE — Code Documentation (Signed)
Pt arrives POV with complaints of feeling as if he is "floating" with left facial numbness and left arm numbness, per pt yesterday 6/3 he had an episode of dizziness which resolved, this AM he went to urgent care for follow up and while at urgent care at 0900 he developed left facial numbness and left arm numbness, code stroke activated in triage, pt taken from triage to CT, cleared for CT by Dr. Corky Downs and then to room 14, NIHSS 1 with left facial numbness, pt states hx of thoracic outlet syndrome with a nerve ablation, no tPA due to improving symptoms, report off to [employer]

## 2018-05-25 NOTE — Progress Notes (Signed)
   05/25/18 1028  Clinical Encounter Type  Visited With Patient and family together  Visit Type Code  Spiritual Encounters  Spiritual Needs Emotional;Prayer   Chaplain responded to code stroke; maintained pastoral presence with patient and spouse, also offered spouse emotional support.  Spouse requested prayer, chaplain offered prayer.  Chaplain escorted spouse to lobby to meet daughter and escorted them back to patient's room.  Ministry of presence and hospitality.  Patient and family open to ongoing chaplain support; chaplain encouraged patient/family to also page chaplain as needed.

## 2018-05-25 NOTE — ED Provider Notes (Signed)
[facility] Emergency Department Provider Note   ____________________________________________    I have reviewed the triage vital signs and the nursing notes.   HISTORY  Chief Complaint Code Stroke     HPI Daniel Calderon is a 52 y.o. male who presents with complaints of left-sided facial numbness as well as intermittent left arm tingling.  Apparently the patient went to [facility] today to have his ears checked for some mild vertigo and they felt that he was out of it and recommended that he come to the emergency department.  Patient states that he feels "like he is floating ".  Apparently he means that he may be dizzy.  No nausea or vomiting.  No chest pain.  Wife reports that he has had the symptoms in the past and has had normal work-ups before   Past Medical History:  Diagnosis Date  . Allergy    Seasonal  . Anxiety   . Benign enlargement of prostate   . Depression   . Elevated BP   . Elevated transaminase level   . Failure of erection   . Fatigue   . Frequent headaches   . Hypogonadism in male   . Kidney stones    History of kidney stones  . Migraine   . Sleep apnea    Currently uses cpap  . Thoracic outlet syndrome    Left Arm    Patient Active Problem List   Diagnosis Date Noted  . Acute pain of left knee 04/18/2018  . Cutaneous skin tags 04/18/2018  . Lower extremity edema 04/18/2018  . Thoracic outlet syndrome 02/19/2018  . Pain in limb 02/19/2018  . Rectal bleeding   . Sinus congestion 09/01/2017  . Muscle strain 09/01/2017  . Chest pain 03/21/2017  . Dyspnea on exertion 02/19/2017  . Language difficulty 02/19/2017  . BPH with obstruction/lower urinary tract symptoms 12/03/2015  . Medication refill 09/13/2015  . Erectile disorder, generalized, mild 07/26/2015  . Hypogonadism in male 05/24/2015  . Environmental allergies 04/13/2015  . Sleep apnea 04/13/2015  . Migraines 04/13/2015  . Neck pain 04/13/2015  .  Adjustment disorder with mixed anxiety and depressed mood 04/13/2015  . Psoriasis 04/13/2015  . History of kidney stones 04/13/2015  . Neuropathy 04/13/2015  . Benign fibroma of prostate 03/24/2015  . Hypertension 03/24/2015  . Elevation of level of transaminase or lactic acid dehydrogenase (LDH) 03/24/2015  . Fatigue 03/24/2015  . 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 COLONOSCOPY WITH PROPOFOL N/A 11/11/2017   Procedure: COLONOSCOPY WITH PROPOFOL;  Surgeon: Lin Landsman, MD;  Location: [place];  Service: Endoscopy;  Laterality: N/A;  . Left Shoulder Surgery  2011  . nerve block     2 in neck. 5 in back.  . NOSE SURGERY    . POLYPECTOMY  11/11/2017   Procedure: POLYPECTOMY;  Surgeon: Lin Landsman, MD;  Location: [place];  Service: Endoscopy;;  . SCALENE NODE BIOPSY / EXCISION    . VASECTOMY      Prior to Admission medications   Medication Sig Start Date End Date Taking? Authorizing Provider  amLODipine (NORVASC) 10 MG tablet Take 1 tablet (10 mg total) by mouth daily. 09/01/17 05/25/18 Yes [name], MD  Cyanocobalamin (VITAMIN B 12 PO) Take by mouth.   Yes [provider]  ibuprofen (ADVIL,MOTRIN) 600 MG tablet Take 1 tablet (600 mg total) by mouth every 6 (six) hours as  needed. 01/17/18  Yes Melynda Ripple, MD  IRON, FERROUS SULFATE, PO Take by mouth.   Yes [provider]  magnesium oxide (MAG-OX) 400 MG tablet Take 400 mg by mouth daily.   Yes [provider]  Multiple Vitamin (MULTIVITAMIN) tablet Take 1 tablet by mouth daily.   Yes [provider]  omeprazole (PRILOSEC) 20 MG capsule TAKE 1 CAPSULE BY MOUTH DAILY 11/05/17  Yes [name], MD  sertraline (ZOLOFT) 100 MG tablet TAKE 1 TABLET BY MOUTH ONCE DAILY 05/05/18  Yes [name], MD  testosterone (ANDROGEL) 50 MG/5GM (1%) GEL Place 5 g onto the skin daily. 04/13/18  Yes Stoioff, Ronda Fairly, MD  amoxicillin-clavulanate (AUGMENTIN) 875-125 MG tablet Take 1 tablet by mouth 2 (two) times daily for 7 days. 05/25/18 06/01/18  Lavonia Drafts, MD  azithromycin (ZITHROMAX) 250 MG tablet Take 500 mg once, then 250 mg for four days Patient not taking: Reported on 05/25/2018 05/02/18   Sharion Balloon, FNP  benzonatate (TESSALON) 100 MG capsule Take 1 capsule (100 mg total) by mouth 3 (three) times daily as needed for cough. Patient not taking: Reported on 05/25/2018 04/29/18   Brunetta Jeans, PA-C  fluticasone [facility]) 50 MCG/ACT nasal spray Place 2 sprays into both nostrils daily. Patient not taking: Reported on 05/25/2018 07/16/15   Rubbie Battiest, RN     Allergies Erythromycin; Cephalexin; Dexamethasone; Oxycodone-acetaminophen; Oxycodone-acetaminophen; Prednisone; Gabapentin; and Ibuprofen  Family History  Problem Relation Age of Onset  . Hypertension Mother        Living  . Hearing loss Mother   . Heart disease Other   . Healthy Father        Living  . Diabetes Brother   . Stroke Paternal Uncle   . Heart attack Paternal Uncle   . Hearing loss Maternal Grandmother   . Healthy Son   . Healthy Daughter   . Kidney cancer Neg Hx   . Bladder Cancer Neg Hx   . Prostate cancer Neg Hx     Social History Social History   Tobacco Use  . Smoking status: Never Smoker  . Smokeless tobacco: Never Used  Substance Use Topics  . Alcohol use: Yes    Alcohol/week: 1.8 oz    Types: 3 Cans of beer per week  . Drug use: No    Review of Systems  Constitutional: No fever/chills Eyes: No visual changes.  ENT: No sore throat. Cardiovascular: Denies chest pain. Respiratory: Denies shortness of breath. Gastrointestinal: No abdominal pain.  Genitourinary: Negative for dysuria. Musculoskeletal: Negative for back pain. Skin: Negative for rash. Neurological: Negative for headaches    ____________________________________________   PHYSICAL EXAM:  VITAL SIGNS: ED Triage Vitals  Enc  Vitals Group     BP 05/25/18 1008 137/78     Pulse Rate 05/25/18 1008 74     Resp 05/25/18 1008 18     Temp 05/25/18 1008 98.2 F (36.8 C)     Temp Source 05/25/18 1008 Oral     SpO2 05/25/18 1008 98 %     Weight --      Height --      Head Circumference --      Peak Flow --      Pain Score 05/25/18 1234 0     Pain Loc --      Pain Edu? --      Excl. in [place]? --     Constitutional: Alert and oriented. No acute distress. Pleasant and  interactive Eyes: Conjunctivae are normal.   Nose: No congestion/rhinnorhea. Mouth/Throat: Mucous membranes are moist.    Cardiovascular: Normal rate, regular rhythm. Grossly normal heart sounds.  Good peripheral circulation. Respiratory: Normal respiratory effort.  No retractions. Lungs CTAB. Gastrointestinal: Soft and nontender. No distention.  No CVA tenderness. Genitourinary: deferred Musculoskeletal: No lower extremity tenderness nor edema.  Warm and well perfused Neurologic:  Normal speech and language. No gross focal neurologic deficits are appreciated.  Skin:  Skin is warm, dry and intact. No rash noted. Psychiatric: Mood and affect are normal. Speech and behavior are normal.  ____________________________________________   LABS (all labs ordered are listed, but only abnormal results are displayed)  Labs Reviewed  COMPREHENSIVE METABOLIC PANEL - Abnormal; Notable for the following components:      Result Value   Glucose, Bld 102 (*)    Albumin 5.1 (*)    AST 84 (*)    ALT 132 (*)    All other components within normal limits  ETHANOL  PROTIME-INR  APTT  CBC  DIFFERENTIAL  TROPONIN I  GLUCOSE, CAPILLARY  URINE DRUG SCREEN, QUALITATIVE ([facility] ONLY)  URINALYSIS, ROUTINE W REFLEX MICROSCOPIC   ____________________________________________  EKG  ED ECG REPORT I, Lavonia Drafts, the attending physician, personally viewed and interpreted this ECG.  Date: 05/25/2018  Rhythm: normal sinus rhythm QRS Axis: normal Intervals:  normal ST/T Wave abnormalities: normal Narrative Interpretation: no evidence of acute ischemia  ____________________________________________  RADIOLOGY  CT head normal MRI brain unremarkable, maxillary sinusitis noted ____________________________________________   PROCEDURES  Procedure(s) performed: No  Procedures   Critical Care performed: No ____________________________________________   INITIAL IMPRESSION / ASSESSMENT AND PLAN / ED COURSE  Pertinent labs & imaging results that were available during my care of the patient were reviewed by me and considered in my medical decision making (see chart for details).  Patient seen by Dr. Doy Mince of neurology as part of code stroke protocol.  She feels symptoms are likely related to CVA, has ordered MRI brain if unremarkable and the patient feels improved okay for discharge  MRI normal, patient is back to baseline.  Will treat for sinusitis as this may have caused some mild vertigo.  Wife and patient are in agreement with plan for discharge    ____________________________________________   FINAL CLINICAL IMPRESSION(S) / ED DIAGNOSES  Final diagnoses:  Dizziness  Acute maxillary sinusitis, recurrence not specified        Note:  This document was prepared using Dragon voice recognition software and may include unintentional dictation errors.    Lavonia Drafts, MD 05/25/18 1332

## 2018-05-25 NOTE — Progress Notes (Signed)
CODE STROKE- PHARMACY COMMUNICATION   Time CODE STROKE called/page received:1028  Time response to CODE STROKE was made (in person or via phone): 1035  Time Stroke Kit retrieved from [place] (only if needed):not indicated  Name of Provider/Nurse contacted: Jana Half  Past Medical History:  Diagnosis Date  . Allergy    Seasonal  . Anxiety   . Benign enlargement of prostate   . Depression   . Elevated BP   . Elevated transaminase level   . Failure of erection   . Fatigue   . Frequent headaches   . Hypogonadism in male   . Kidney stones    History of kidney stones  . Migraine   . Sleep apnea    Currently uses cpap  . Thoracic outlet syndrome    Left Arm   Prior to Admission medications   Medication Sig Start Date End Date Taking? Authorizing Provider  amLODipine (NORVASC) 10 MG tablet Take 1 tablet (10 mg total) by mouth daily. 09/01/17 05/25/18 Yes [name], MD  Cyanocobalamin (VITAMIN B 12 PO) Take by mouth.   Yes [provider]  ibuprofen (ADVIL,MOTRIN) 600 MG tablet Take 1 tablet (600 mg total) by mouth every 6 (six) hours as needed. 01/17/18  Yes Melynda Ripple, MD  IRON, FERROUS SULFATE, PO Take by mouth.   Yes [provider]  magnesium oxide (MAG-OX) 400 MG tablet Take 400 mg by mouth daily.   Yes [provider]  Multiple Vitamin (MULTIVITAMIN) tablet Take 1 tablet by mouth daily.   Yes [provider]  omeprazole (PRILOSEC) 20 MG capsule TAKE 1 CAPSULE BY MOUTH DAILY 11/05/17  Yes [name], MD  sertraline (ZOLOFT) 100 MG tablet TAKE 1 TABLET BY MOUTH ONCE DAILY 05/05/18  Yes [name], MD  testosterone (ANDROGEL) 50 MG/5GM (1%) GEL Place 5 g onto the skin daily. 04/13/18  Yes Stoioff, Ronda Fairly, MD  azithromycin (ZITHROMAX) 250 MG tablet Take 500 mg once, then 250 mg for four days Patient not taking: Reported on 05/25/2018 05/02/18   Sharion Balloon, FNP  benzonatate (TESSALON) 100 MG capsule Take 1  capsule (100 mg total) by mouth 3 (three) times daily as needed for cough. Patient not taking: Reported on 05/25/2018 04/29/18   Brunetta Jeans, PA-C  fluticasone [facility]) 50 MCG/ACT nasal spray Place 2 sprays into both nostrils daily. Patient not taking: Reported on 05/25/2018 07/16/15   Rubbie Battiest, RN  Testosterone 12.5 MG/ACT (1%) GEL  04/29/18 05/25/18  [provider]    Charlett Nose ,PharmD Clinical Pharmacist  05/25/2018  12:13 PM

## 2018-05-25 NOTE — Consult Note (Addendum)
Referring Physician: Corky Downs    Chief Complaint: Left sided numbness, difficulty with vision  HPI: Daniel Calderon is an 52 y.o. male who had an episode of dizziness yesterday that resolved.  Was going to the doctor today for follow up of that episode and again became dizzy.  Also developed left facial and left upper extremity paresthesias. Also reports blurred vision in both eyes.     Date last known well: Date: 05/25/2018 Time last known well: Time: 09:00 tPA Given: No: Symptoms improving  Past Medical History:  Diagnosis Date  . Allergy    Seasonal  . Anxiety   . Benign enlargement of prostate   . Depression   . Elevated BP   . Elevated transaminase level   . Failure of erection   . Fatigue   . Frequent headaches   . Hypogonadism in male   . Kidney stones    History of kidney stones  . Migraine   . Sleep apnea    Currently uses cpap  . Thoracic outlet syndrome    Left Arm    Past Surgical History:  Procedure Laterality Date  . COLONOSCOPY WITH PROPOFOL N/A 11/11/2017   Procedure: COLONOSCOPY WITH PROPOFOL;  Surgeon: Lin Landsman, MD;  Location: [place];  Service: Endoscopy;  Laterality: N/A;  . Left Shoulder Surgery  2011  . nerve block     2 in neck. 5 in back.  . NOSE SURGERY    . POLYPECTOMY  11/11/2017   Procedure: POLYPECTOMY;  Surgeon: Lin Landsman, MD;  Location: [place];  Service: Endoscopy;;  . SCALENE NODE BIOPSY / EXCISION    . VASECTOMY      Family History  Problem Relation Age of Onset  . Hypertension Mother        Living  . Hearing loss Mother   . Heart disease Other   . Healthy Father        Living  . Diabetes Brother   . Stroke Paternal Uncle   . Heart attack Paternal Uncle   . Hearing loss Maternal Grandmother   . Healthy Son   . Healthy Daughter   . Kidney cancer Neg Hx   . Bladder Cancer Neg Hx   . Prostate cancer Neg Hx    Social History:  reports that he has never smoked. He has never used  smokeless tobacco. He reports that he drinks about 1.8 oz of alcohol per week. He reports that he does not use drugs.  Allergies:  Allergies  Allergen Reactions  . Erythromycin Nausea And Vomiting  . Cephalexin     Other reaction(s): Other (See Comments) Other Reaction: Other reaction  . Dexamethasone     Other reaction(s): Other (See Comments) Other Reaction: Other reaction  . Oxycodone-Acetaminophen     Other reaction(s): Other (See Comments) Other Reaction: Other reaction  . Oxycodone-Acetaminophen     Other reaction(s): Other (See Comments) Other Reaction: Other reaction  . Prednisone     Other reaction(s): Other (See Comments) Other Reaction: Other reaction  . Gabapentin Other (See Comments)    Aggressive  . Ibuprofen Itching    Can take in low doses per pt    Medications: I have reviewed the patient's current medications. Prior to Admission:  Prior to Admission medications   Medication Sig Start Date End Date Taking? Authorizing Provider  amLODipine (NORVASC) 10 MG tablet Take 1 tablet (10 mg total) by mouth daily. 09/01/17 05/25/18 Yes [name], MD  Cyanocobalamin (  VITAMIN B 12 PO) Take by mouth.   Yes [provider]  ibuprofen (ADVIL,MOTRIN) 600 MG tablet Take 1 tablet (600 mg total) by mouth every 6 (six) hours as needed. 01/17/18  Yes Melynda Ripple, MD  IRON, FERROUS SULFATE, PO Take by mouth.   Yes [provider]  magnesium oxide (MAG-OX) 400 MG tablet Take 400 mg by mouth daily.   Yes [provider]  Multiple Vitamin (MULTIVITAMIN) tablet Take 1 tablet by mouth daily.   Yes [provider]  omeprazole (PRILOSEC) 20 MG capsule TAKE 1 CAPSULE BY MOUTH DAILY 11/05/17  Yes [name], MD  sertraline (ZOLOFT) 100 MG tablet TAKE 1 TABLET BY MOUTH ONCE DAILY 05/05/18  Yes [name], MD  testosterone (ANDROGEL) 50 MG/5GM (1%) GEL Place 5 g onto the skin daily. 04/13/18  Yes Stoioff, Ronda Fairly, MD  azithromycin  (ZITHROMAX) 250 MG tablet Take 500 mg once, then 250 mg for four days Patient not taking: Reported on 05/25/2018 05/02/18   Sharion Balloon, FNP  benzonatate (TESSALON) 100 MG capsule Take 1 capsule (100 mg total) by mouth 3 (three) times daily as needed for cough. Patient not taking: Reported on 05/25/2018 04/29/18   Brunetta Jeans, PA-C  fluticasone [facility]) 50 MCG/ACT nasal spray Place 2 sprays into both nostrils daily. Patient not taking: Reported on 05/25/2018 07/16/15   Rubbie Battiest, RN     ROS: History obtained from the patient  General ROS: negative for - chills, fatigue, fever, night sweats, weight gain or weight loss Psychological ROS: negative for - behavioral disorder, hallucinations, memory difficulties, mood swings or suicidal ideation Ophthalmic ROS: blurry vision ENT ROS: dizziness Allergy and Immunology ROS: negative for - hives or itchy/watery eyes Hematological and Lymphatic ROS: negative for - bleeding problems, bruising or swollen lymph nodes Endocrine ROS: negative for - galactorrhea, hair pattern changes, polydipsia/polyuria or temperature intolerance Respiratory ROS: negative for - cough, hemoptysis, shortness of breath or wheezing Cardiovascular ROS: negative for - chest pain, dyspnea on exertion, edema or irregular heartbeat Gastrointestinal ROS: negative for - abdominal pain, diarrhea, hematemesis, nausea/vomiting or stool incontinence Genito-Urinary ROS: negative for - dysuria, hematuria, incontinence or urinary frequency/urgency Musculoskeletal ROS: negative for - joint swelling or muscular weakness Neurological ROS: as noted in HPI Dermatological ROS: negative for rash and skin lesion changes  Physical Examination: Blood pressure 137/78, pulse 74, temperature 98.2 F (36.8 C), temperature source Oral, resp. rate 18, SpO2 98 %.  HEENT-  Normocephalic, no lesions, without obvious abnormality.  Normal external eye and conjunctiva.  Normal TM's bilaterally.   Normal auditory canals and external ears. Normal external nose, mucus membranes and septum.  Normal pharynx. Cardiovascular- S1, S2 normal, pulses palpable throughout   Lungs- chest clear, no wheezing, rales, normal symmetric air entry Abdomen- soft, non-tender; bowel sounds normal; no masses,  no organomegaly Extremities- no edema Lymph-no adenopathy palpable Musculoskeletal-no joint tenderness, deformity or swelling Skin-warm and dry, no hyperpigmentation, vitiligo, or suspicious lesions  Neurological Examination   Mental Status: Alert, oriented, thought content appropriate.  Speech fluent without evidence of aphasia although slow in answering questions.  Able to follow 3 step commands without difficulty. Cranial Nerves: II: Discs flat bilaterally; Visual fields grossly normal, pupils equal, round, reactive to light and accommodation III,IV, VI: ptosis not present, extra-ocular motions intact bilaterally V,VII: smile symmetric, facial light touch sensation decreased on the left VIII: hearing normal bilaterally IX,X: gag reflex present XI: bilateral shoulder shrug XII: midline tongue extension Motor:  Right : Upper extremity   5/5    Left:     Upper extremity   5/5  Lower extremity   5/5     Lower extremity   5/5 Tone and bulk:normal tone throughout; no atrophy noted Sensory: Pinprick and light touch decreased on the left upper extremity on on the left chest to below the breast line excluding sensory changes on the back Deep Tendon Reflexes: 2+ and symmetric throughout Plantars: Right: downgoing   Left: downgoing Cerebellar: Normal finger-to-nose and normal heel-to-shin testing bilaterally Gait: not tested due to safety concerns    Laboratory Studies:  Basic Metabolic Panel: No results for input(s): NA, K, CL, CO2, GLUCOSE, BUN, CREATININE, CALCIUM, MG, PHOS in the last 168 hours.  Liver Function Tests: No results for input(s): AST, ALT, ALKPHOS, BILITOT, PROT, ALBUMIN in the  last 168 hours. No results for input(s): LIPASE, AMYLASE in the last 168 hours. No results for input(s): AMMONIA in the last 168 hours.  CBC: Recent Labs  Lab 05/25/18 1036  WBC 5.6  NEUTROABS 3.7  HGB 15.2  HCT 43.5  MCV 88.1  PLT 159    Cardiac Enzymes: No results for input(s): CKTOTAL, CKMB, CKMBINDEX, TROPONINI in the last 168 hours.  BNP: Invalid input(s): POCBNP  CBG: Recent Labs  Lab 05/25/18 1027  GLUCAP 86    Microbiology: Results for orders placed or performed during the hospital encounter of 01/17/18  Rapid Influenza A&B Antigens ([place] only)     Status: None   Collection Time: 01/17/18 11:38 AM  Result Value Ref Range Status   Influenza A ([facility]) NEGATIVE NEGATIVE Final   Influenza B [facility]) NEGATIVE NEGATIVE Final    Comment: Performed at [facility], [address], [place], [place] [address]    Coagulation Studies: No results for input(s): LABPROT, INR in the last 72 hours.  Urinalysis: No results for input(s): COLORURINE, LABSPEC, PHURINE, GLUCOSEU, HGBUR, BILIRUBINUR, KETONESUR, PROTEINUR, UROBILINOGEN, NITRITE, LEUKOCYTESUR in the last 168 hours.  Invalid input(s): APPERANCEUR  Lipid Panel:    Component Value Date/Time   CHOL 198 10/02/2017   CHOL 195 12/03/2015 1444   TRIG 167 (A) 10/02/2017   HDL 46 10/02/2017   HDL 38 (L) 12/03/2015 1444   CHOLHDL 5.1 02/20/2017 0815   VLDL 40 02/20/2017 0815   LDLCALC 119 10/02/2017   LDLCALC 104 (H) 12/03/2015 1444    HgbA1C:  Lab Results  Component Value Date   HGBA1C 5.3 02/20/2017    Urine Drug Screen:      Component Value Date/Time   LABOPIA NONE DETECTED 02/19/2017 1020   COCAINSCRNUR NONE DETECTED 02/19/2017 1020   LABBENZ NONE DETECTED 02/19/2017 1020   AMPHETMU NONE DETECTED 02/19/2017 1020   THCU NONE DETECTED 02/19/2017 1020   LABBARB NONE DETECTED 02/19/2017 1020    Alcohol Level: No results for input(s): ETH in the last 168 hours.  Other results: EKG: normal  sinus rhythm at 78 bpm.  Imaging: Ct Head Code Stroke Wo Contrast  Result Date: 05/25/2018 CLINICAL DATA:  Code stroke. 52 year old male with left side numbness, right face droop. Dizziness. EXAM: CT HEAD WITHOUT CONTRAST TECHNIQUE: Contiguous axial images were obtained from the base of the skull through the vertex without intravenous contrast. COMPARISON:  Brain MRI the and head CT without contrast 02/19/2017. FINDINGS: Brain: Cerebral volume is stable and within normal limits. No midline shift, ventriculomegaly, mass effect, evidence of mass lesion, intracranial hemorrhage or evidence of cortically based acute infarction. Gray-white matter differentiation is within  normal limits throughout the brain. Vascular: No suspicious intracranial vascular hyperdensity. Skull: No acute osseous abnormality identified. Sinuses/Orbits: Stable with mild to moderate generalized frontal and ethmoid mucosal thickening and retention cyst in the left maxillary sinus. Tympanic cavities and mastoids remain clear. Other: Stable and negative orbit and scalp soft tissues. ASPECTS ([facility] Stroke Program Early CT Score) - Ganglionic level infarction (caudate, lentiform nuclei, internal capsule, insula, M1-M3 cortex): 7 - Supraganglionic infarction (M4-M6 cortex): 3 Total score (0-10 with 10 being normal): 10 IMPRESSION: 1. Stable and normal noncontrast CT appearance of the brain. 2. ASPECTS is 10. Electronically Signed   By: Genevie Ann M.D.   On: 05/25/2018 10:29    Assessment: 52 y.o. male presenting with dizziness, blurred vision and left sided numbness. Patient has had these episodes intermittently since his thoracic outlet surgery a few years ago.  Last was in March of 2018.  Work up at that time was unremarkable.    Stroke Risk Factors - hypertension  Plan: 1. HgbA1c, fasting lipid panel, TSH 2. MRI of the brain without contrast.  If unremarkable, further stroke work up not indicated at this time.   3. ASA 81mg  daily 4.  NPO until RN stroke swallow screen 5. Telemetry monitoring 6. Frequent neuro checks   Case discussed with Dr. Carma Lair, MD Neurology [phone] 05/25/2018, 10:54 AM

## 2018-05-25 NOTE — Progress Notes (Signed)
Patient ID: Daniel Calderon, male    DOB: 04/16/1966, 52 y.o.   MRN: [id number]  PCP: [name], MD  Chief Complaint  Patient presents with  . focus-dizziness/nasal cong    Subjective:  HPI Daniel Calderon is a 52 y.o. male presents for evaluation with dizziness x 2 days. History of TIA, hypogonadism, memory loss, and elevated LFT secondary to possible hepatocellular disease. Tan reports after bending over to brush his teeth, he experienced profound dizziness yesterday morning to the extend of needing to hold on to spouse to regain stability. The dizziness devloped spontaneously. Dizziness, is exacerbated by positional changes. He denies any URI symptoms although reports that his Daniel Calderon told him he sounded "congested" and recommended that he come here today. He reports associated visual changes over the course of last 24 hours with problems seeing clearing at a distance. Denies headache, chest pain, shortness of breath or ocular pressure. On arrival, patient's blood pressure elevated, however he notes that he has taken his Norvasc today. Social History   Socioeconomic History  . Marital status: Married    Spouse name: Not on file  . Number of children: Not on file  . Years of education: Not on file  . Highest education level: Not on file  Occupational History  . Not on file  Social Needs  . Financial resource strain: Not on file  . Food insecurity:    Worry: Not on file    Inability: Not on file  . Transportation needs:    Medical: Not on file    Non-medical: Not on file  Tobacco Use  . Smoking status: Never Smoker  . Smokeless tobacco: Never Used  Substance and Sexual Activity  . Alcohol use: Yes    Alcohol/week: 1.8 oz    Types: 3 Cans of beer per week  . Drug use: No  . Sexual activity: Yes    Partners: Female    Comment: Daniel Calderon  Lifestyle  . Physical activity:    Days per week: Not on file    Minutes per session: Not on file  . Stress: Not on file   Relationships  . Social connections:    Talks on phone: Not on file    Gets together: Not on file    Attends religious service: Not on file    Active member of club or organization: Not on file    Attends meetings of clubs or organizations: Not on file    Relationship status: Not on file  . Intimate partner violence:    Fear of current or ex partner: Not on file    Emotionally abused: Not on file    Physically abused: Not on file    Forced sexual activity: Not on file  Other Topics Concern  . Not on file  Social History Narrative   [employer]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    Lives with Daniel Calderon and daughter (60)   High school and tech school   Caffeine- 2-3 coffee    Enjoys- yard work, Location manager- 2 dogs     Family History  Problem Relation Age of Onset  . Hypertension Mother        Living  . Hearing loss Mother   . Heart disease Other   . Healthy Father        Living  . Diabetes Brother   . Stroke Paternal Uncle   . Heart attack Paternal Uncle   . Hearing loss Maternal Grandmother   . Healthy Son   .  Healthy Daughter   . Kidney cancer Neg Hx   . Bladder Cancer Neg Hx   . Prostate cancer Neg Hx     Review of Systems Pertinent negatives listed in HPI   Patient Active Problem List   Diagnosis Date Noted  . Acute pain of left knee 04/18/2018  . Cutaneous skin tags 04/18/2018  . Lower extremity edema 04/18/2018  . Thoracic outlet syndrome 02/19/2018  . Pain in limb 02/19/2018  . Rectal bleeding   . Sinus congestion 09/01/2017  . Muscle strain 09/01/2017  . Chest pain 03/21/2017  . Dyspnea on exertion 02/19/2017  . Language difficulty 02/19/2017  . BPH with obstruction/lower urinary tract symptoms 12/03/2015  . Medication refill 09/13/2015  . Erectile disorder, generalized, mild 07/26/2015  . Hypogonadism in male 05/24/2015  . Environmental allergies 04/13/2015  . Sleep apnea 04/13/2015  . Migraines 04/13/2015  . Neck pain 04/13/2015  . Adjustment disorder with mixed  anxiety and depressed mood 04/13/2015  . Psoriasis 04/13/2015  . History of kidney stones 04/13/2015  . Neuropathy 04/13/2015  . Benign fibroma of prostate 03/24/2015  . Hypertension 03/24/2015  . Elevation of level of transaminase or lactic acid dehydrogenase (LDH) 03/24/2015  . Fatigue 03/24/2015  . 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 Erythromycin Nausea And Vomiting  . Cephalexin     Other reaction(s): Other (See Comments) Other Reaction: Other reaction  . Dexamethasone     Other reaction(s): Other (See Comments) Other Reaction: Other reaction  . Oxycodone-Acetaminophen     Other reaction(s): Other (See Comments) Other Reaction: Other reaction  . Oxycodone-Acetaminophen     Other reaction(s): Other (See Comments) Other Reaction: Other reaction  . Prednisone     Other reaction(s): Other (See Comments) Other Reaction: Other reaction  . Gabapentin Other (See Comments)    Aggressive  . Ibuprofen Itching    Can take in low doses per pt    Prior to Admission medications   Medication Sig Start Date End Date Taking? Authorizing Provider  Cyanocobalamin (VITAMIN B 12 PO) Take by mouth.   Yes [provider]  fluticasone (FLONASE) 50 MCG/ACT nasal spray Place 2 sprays into both nostrils daily. 07/16/15  Yes Doss, Velora Heckler, RN  ibuprofen (ADVIL,MOTRIN) 600 MG tablet Take 1 tablet (600 mg total) by mouth every 6 (six) hours as needed. 01/17/18  Yes Melynda Ripple, MD  IRON, FERROUS SULFATE, PO Take by mouth.   Yes [provider]  magnesium oxide (MAG-OX) 400 MG tablet Take 400 mg by mouth daily.   Yes [provider]  omeprazole (PRILOSEC) 20 MG capsule TAKE 1 CAPSULE BY MOUTH DAILY 11/05/17  Yes [name], MD  sertraline (ZOLOFT) 100 MG tablet TAKE 1 TABLET BY MOUTH ONCE DAILY 05/05/18  Yes [name], MD  testosterone (ANDROGEL) 50 MG/5GM (1%) GEL Place 5 g onto the skin daily. 04/13/18  Yes Stoioff, Ronda Fairly, MD   Testosterone 12.5 MG/ACT (1%) GEL  04/29/18  Yes [provider]  amLODipine (NORVASC) 10 MG tablet Take 1 tablet (10 mg total) by mouth daily. 09/01/17 04/16/18  [name], MD  azithromycin (ZITHROMAX) 250 MG tablet Take 500 mg once, then 250 mg for four days Patient not taking: Reported on 05/25/2018 05/02/18   Sharion Balloon, FNP  benzonatate (TESSALON) 100 MG capsule Take 1 capsule (100 mg total) by mouth 3 (three) times daily as needed for cough. Patient not taking: Reported on 05/25/2018 04/29/18  Brunetta Jeans, PA-C  Multiple Vitamin (MULTIVITAMIN) tablet Take 1 tablet by mouth daily.    [provider]    Past Medical, Surgical Family and Social History reviewed and updated.    Objective:   Today's Vitals   05/25/18 0849  BP: (!) 140/96  Pulse: 70  Temp: 98.2 F (36.8 C)  SpO2: 98%  Weight: 210 lb (95.3 kg)    Wt Readings from Last 3 Encounters:  05/25/18 210 lb (95.3 kg)  05/18/18 211 lb (95.7 kg)  04/16/18 215 lb 6.4 oz (97.7 kg)    Physical Exam  Constitutional: He is active and cooperative. He appears ill.  HENT:  Head: Normocephalic and atraumatic.  Eyes: Pupils are equal, round, and reactive to light.  Fundoscopic exam:      The right eye shows no arteriolar narrowing, no AV nicking, no exudate and no hemorrhage.       The left eye shows no arteriolar narrowing, no AV nicking, no exudate and no hemorrhage.  Erythema of sclera noted on exam   Cardiovascular: Normal rate and regular rhythm.  Pulmonary/Chest: Effort normal and breath sounds normal.  Neurological: He is alert. He exhibits normal muscle tone. Coordination abnormal.  Imbalance noted with positional changes  Skin: Skin is warm and dry.  Psychiatric: He has a normal mood and affect. His behavior is normal. Judgment and thought content normal.    Assessment & Plan:  1. Dizzinesses 2. Spell of visual disturbance 3. Eye redness  Given patient's history, and a negative  evaluation for URI symptoms and bilateral ears are within normal limits, I recommend immediate follow-up at [facility] Emergency Department. He has had persistently elevated LFTS, with prior imaging suggestive of hepatocellular disease.   Immediate referral to ED recommended for further work-up.   Carroll Sage. Kenton Kingfisher, MSN, FNP-C [facility]  [address] [place], [place] [address] [phone]

## 2018-05-25 NOTE — ED Notes (Signed)
First Nurse Note:  Patient complaining dizziness, feels like he is "floating".  Complaining of blurry vision in both eyes.  Wife states he has one episode yesterday.  Patient is alert but slow to respond.  Follows commands.  Grip strength equal.  Facial movements symmetrical.  To Triage 2.

## 2018-05-25 NOTE — ED Notes (Signed)
Pt alert and oriented X4, active, cooperative, pt in NAD. RR even and unlabored, color WNL.  Pt informed to return if any life threatening symptoms occur.  Discharge and followup instructions reviewed.  

## 2018-05-25 NOTE — ED Triage Notes (Signed)
Pts wife states bad headache and a slight moment of dizziness yesterday am.  This am, while at Dr around 0900 pt began feeling dizzy, states he feels "weird," some numbness on the left side of his face, speech clear, grips equal.

## 2018-05-25 NOTE — ED Notes (Signed)
Wife with patient  

## 2018-06-16 ENCOUNTER — Ambulatory Visit: Payer: Self-pay | Admitting: Family Medicine

## 2018-06-29 NOTE — Progress Notes (Signed)
Erroneous

## 2018-07-02 ENCOUNTER — Other Ambulatory Visit: Payer: Self-pay | Admitting: Orthopedic Surgery

## 2018-07-02 DIAGNOSIS — M25562 Pain in left knee: Principal | ICD-10-CM

## 2018-07-02 DIAGNOSIS — G8929 Other chronic pain: Secondary | ICD-10-CM

## 2018-07-03 ENCOUNTER — Emergency Department
Admission: EM | Admit: 2018-07-03 | Discharge: 2018-07-03 | Disposition: A | Discharge status: Home / Self Care | Payer: PRIVATE HEALTH INSURANCE | Attending: Emergency Medicine | Admitting: Emergency Medicine

## 2018-07-03 ENCOUNTER — Other Ambulatory Visit: Payer: Self-pay

## 2018-07-03 DIAGNOSIS — I10 Essential (primary) hypertension: Secondary | ICD-10-CM | POA: Insufficient documentation

## 2018-07-03 DIAGNOSIS — Z79899 Other long term (current) drug therapy: Secondary | ICD-10-CM | POA: Diagnosis not present

## 2018-07-03 DIAGNOSIS — H5713 Ocular pain, bilateral: Secondary | ICD-10-CM | POA: Diagnosis present

## 2018-07-03 DIAGNOSIS — H16133 Photokeratitis, bilateral: Secondary | ICD-10-CM | POA: Diagnosis not present

## 2018-07-03 MED ORDER — TETRACAINE HCL 0.5 % OP SOLN
1.0000 [drp] | Freq: Once | OPHTHALMIC | Status: AC
Start: 2018-07-03 — End: 2018-07-03
  Administered 2018-07-03: 1 [drp] via OPHTHALMIC

## 2018-07-03 MED ORDER — TOBRAMYCIN 0.3 % OP SOLN
2.0000 [drp] | OPHTHALMIC | Status: DC
  Administered 2018-07-03: 2 [drp] via OPHTHALMIC
  Filled 2018-07-03: qty 5

## 2018-07-03 MED ORDER — FLUORESCEIN SODIUM 1 MG OP STRP
ORAL_STRIP | OPHTHALMIC | Status: AC
  Filled 2018-07-03: qty 1

## 2018-07-03 MED ORDER — HYDROCODONE-ACETAMINOPHEN 5-325 MG PO TABS: 1.0000 | ORAL_TABLET | Freq: Four times a day (QID) | ORAL | 0 refills | Status: DC | PRN

## 2018-07-03 MED ORDER — FLUORESCEIN SODIUM 1 MG OP STRP
1.0000 | ORAL_STRIP | Freq: Once | OPHTHALMIC | Status: AC
  Administered 2018-07-03: 1 via OPHTHALMIC

## 2018-07-03 MED ORDER — HYDROCODONE-ACETAMINOPHEN 5-325 MG PO TABS
1.0000 | ORAL_TABLET | Freq: Once | ORAL | Status: AC
  Administered 2018-07-03: 1 via ORAL
  Filled 2018-07-03: qty 1

## 2018-07-03 MED ORDER — TETRACAINE HCL 0.5 % OP SOLN
OPHTHALMIC | Status: AC
  Filled 2018-07-03: qty 4

## 2018-07-03 MED ORDER — KETOROLAC TROMETHAMINE 0.5 % OP SOLN
1.0000 [drp] | Freq: Four times a day (QID) | OPHTHALMIC | Status: DC
  Administered 2018-07-03: 1 [drp] via OPHTHALMIC
  Filled 2018-07-03: qty 3

## 2018-07-03 NOTE — ED Triage Notes (Signed)
Pt arrives to ED stating he was exposed to UV light yesterday while working. States blurred vision, sensitivity to light. Also c/o drainage from eyes. Pt states he put ice on eyes, took benadryl, washed eyes with water.   States exposure was about 2 minutes.   Alert, oriented, ambulatory.

## 2018-07-03 NOTE — Discharge Instructions (Addendum)
Begin using eyedrops as directed.  Acular is 1 drop each eye 4 times a day.  This medication is for inflammation.  Tobrex ophthalmic solution is 2 drops each eye every 4 hours while awake.  Take Norco every 6 hours as needed for pain. Follow-up with Daniel Calderon Monday morning.  If any worsening return to the emergency department immediately.

## 2018-07-03 NOTE — ED Notes (Signed)
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

## 2018-07-03 NOTE — ED Provider Notes (Signed)
[facility] Emergency Department Provider Note   ____________________________________________   First MD Initiated Contact with Patient 07/03/18 1144     (approximate)  I have reviewed the triage vital signs and the nursing notes.   HISTORY  Chief Complaint Eye Problem   HPI Daniel Calderon is a 52 y.o. male is here with a Workmen's Comp. injury.  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  Since that time he has developed photophobia.  Patient denies any prior visual problems.  Patient has been wearing his sunglasses since he woke this morning.  Currently rates his pain as a 10/10.   Past Medical History:  Diagnosis Date  . Allergy    Seasonal  . Anxiety   . Benign enlargement of prostate   . Depression   . Elevated BP   . Elevated transaminase level   . Failure of erection   . Fatigue   . Frequent headaches   . Hypogonadism in male   . Kidney stones    History of kidney stones  . Migraine   . Sleep apnea    Currently uses cpap  . Thoracic outlet syndrome    Left Arm    Patient Active Problem List   Diagnosis Date Noted  . Acute pain of left knee 04/18/2018  . Cutaneous skin tags 04/18/2018  . Lower extremity edema 04/18/2018  . Thoracic outlet syndrome 02/19/2018  . Pain in limb 02/19/2018  . Rectal bleeding   . Sinus congestion 09/01/2017  . Muscle strain 09/01/2017  . Chest pain 03/21/2017  . Dyspnea on exertion 02/19/2017  . Language difficulty 02/19/2017  . BPH with obstruction/lower urinary tract symptoms 12/03/2015  . Medication refill 09/13/2015  . Erectile disorder, generalized, mild 07/26/2015  . Hypogonadism in male 05/24/2015  . Environmental allergies 04/13/2015  . Sleep apnea 04/13/2015  . Migraines 04/13/2015  . Neck pain 04/13/2015  . Adjustment disorder with mixed anxiety and depressed mood 04/13/2015  . Psoriasis 04/13/2015  .  History of kidney stones 04/13/2015  . Neuropathy 04/13/2015  . Benign fibroma of prostate 03/24/2015  . Hypertension 03/24/2015  . Elevation of level of transaminase or lactic acid dehydrogenase (LDH) 03/24/2015  . Fatigue 03/24/2015  . 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 COLONOSCOPY WITH PROPOFOL N/A 11/11/2017   Procedure: COLONOSCOPY WITH PROPOFOL;  Surgeon: Lin Landsman, MD;  Location: [place];  Service: Endoscopy;  Laterality: N/A;  . Left Shoulder Surgery  2011  . nerve block     2 in neck. 5 in back.  . NOSE SURGERY    . POLYPECTOMY  11/11/2017   Procedure: POLYPECTOMY;  Surgeon: Lin Landsman, MD;  Location: [place];  Service: Endoscopy;;  . SCALENE NODE BIOPSY / EXCISION    . VASECTOMY      Prior to Admission medications   Medication Sig Start Date End Date Taking? Authorizing Provider  amLODipine (NORVASC) 10 MG tablet Take 1 tablet (10 mg total) by mouth daily. 09/01/17 05/25/18  [name], MD  Cyanocobalamin (VITAMIN B 12 PO) Take by mouth.    [provider]  fluticasone (FLONASE) 50 MCG/ACT nasal spray Place 2 sprays into both nostrils daily. 07/16/15   Rubbie Battiest, RN  HYDROcodone-acetaminophen (NORCO/VICODIN) 5-325 MG tablet Take 1 tablet by mouth every 6 (six) hours as needed for moderate pain.  07/03/18   Johnn Hai, PA-C  ibuprofen (ADVIL,MOTRIN) 600 MG tablet Take 1 tablet (600 mg total) by mouth every 6 (six) hours as needed. 01/17/18   Melynda Ripple, MD  IRON, FERROUS SULFATE, PO Take by mouth.    [provider]  magnesium oxide (MAG-OX) 400 MG tablet Take 400 mg by mouth daily.    [provider]  Multiple Vitamin (MULTIVITAMIN) tablet Take 1 tablet by mouth daily.    [provider]  omeprazole (PRILOSEC) 20 MG capsule TAKE 1 CAPSULE BY MOUTH DAILY 11/05/17   [name], MD  sertraline (ZOLOFT) 100 MG tablet TAKE 1  TABLET BY MOUTH ONCE DAILY 05/05/18   [name], MD  testosterone (ANDROGEL) 50 MG/5GM (1%) GEL Place 5 g onto the skin daily. 04/13/18   Stoioff, Ronda Fairly, MD    Allergies Erythromycin; Cephalexin; Dexamethasone; Oxycodone-acetaminophen; Oxycodone-acetaminophen; Prednisone; Gabapentin; and Ibuprofen  Family History  Problem Relation Age of Onset  . Hypertension Mother        Living  . Hearing loss Mother   . Heart disease Other   . Healthy Father        Living  . Diabetes Brother   . Stroke Paternal Uncle   . Heart attack Paternal Uncle   . Hearing loss Maternal Grandmother   . Healthy Son   . Healthy Daughter   . Kidney cancer Neg Hx   . Bladder Cancer Neg Hx   . Prostate cancer Neg Hx     Social History Social History   Tobacco Use  . Smoking status: Never Smoker  . Smokeless tobacco: Never Used  Substance Use Topics  . Alcohol use: Yes    Alcohol/week: 1.8 oz    Types: 3 Cans of beer per week  . Drug use: No    Review of Systems Constitutional: No fever/chills Eyes: Positive photophobia. Cardiovascular: Denies chest pain. Respiratory: Denies shortness of breath. Skin: Negative for rash. Neurological: Positive for headaches ____________________________________________   PHYSICAL EXAM:  VITAL SIGNS: ED Triage Vitals  Enc Vitals Group     BP 07/03/18 1116 (!) 142/89     Pulse Rate 07/03/18 1116 83     Resp 07/03/18 1116 18     Temp 07/03/18 1116 98.2 F (36.8 C)     Temp Source 07/03/18 1116 Oral     SpO2 07/03/18 1116 98 %     Weight 07/03/18 1117 210 lb (95.3 kg)     Height 07/03/18 1117 6\' 2"  (1.88 m)     Head Circumference --      Peak Flow --      Pain Score 07/03/18 1116 10     Pain Loc --      Pain Edu? --      Excl. in [place]? --    Constitutional: Alert and oriented. Well appearing and in no acute distress.  Patient is extremely photophobic. Eyes: Conjunctivae are inflamed bilaterally.  PERRL. EOMI.  Head: Atraumatic. Nose: No  congestion/rhinnorhea. Neck: No stridor.   Respiratory: Normal respiratory effort.  No retractions.  Musculoskeletal: Moves upper and lower extremities without any difficulty and normal gait was noted. Neurologic:  Normal speech and language. No gross focal neurologic deficits are appreciated.  Skin:  Skin is warm, dry and intact. No rash noted. Psychiatric: Mood and affect are normal. Speech and behavior are normal.  ____________________________________________   LABS (all labs ordered are listed, but only abnormal results are displayed)  Labs Reviewed - No data  to display  PROCEDURES  Procedure(s) performed: Tetracaine was placed in the patient's eyes.  Fluorescein dye was placed to each eye.  No corneal abrasions were noted.  Visual acuity was noted.  Procedures  Critical Care performed: No  ____________________________________________   INITIAL IMPRESSION / ASSESSMENT AND PLAN / ED COURSE  As part of my medical decision making, I reviewed the following data within the electronic MEDICAL RECORD NUMBER Notes from prior ED visits and [place] Controlled Substance Database  Patient was given Norco while in the department for pain.  He was also discharged with prescription for the same.  Because this is Workmen's Comp. injury that occurred here at the hospital he was given the Acular 1 drop to each eye 4 times a day for inflammation and Tobrex ophthalmic solution 2 drops both eyes every 4 hours while awake.  He is return to the emergency department for any severe worsening of his symptoms.  He is encouraged to use his sunglasses for light exposure.  He will not be cleared for work until seen by the ophthalmologist.  ____________________________________________   FINAL CLINICAL IMPRESSION(S) / ED DIAGNOSES  Final diagnoses:  Photokeratoconjunctivitis of both eyes     ED Discharge Orders        Ordered    HYDROcodone-acetaminophen (NORCO/VICODIN) 5-325 MG tablet  Every 6 hours PRN      07/03/18 1302       Note:  This document was prepared using Dragon voice recognition software and may include unintentional dictation errors.    Johnn Hai, PA-C 07/03/18 1555    Harvest Dark, MD 07/04/18 1244

## 2018-07-15 ENCOUNTER — Ambulatory Visit
Admission: RE | Admit: 2018-07-15 | Discharge: 2018-07-15 | Disposition: A | Discharge status: Home / Self Care | Payer: No Typology Code available for payment source | Source: Ambulatory Visit | Attending: Orthopedic Surgery | Admitting: Orthopedic Surgery

## 2018-07-15 DIAGNOSIS — S83242A Other tear of medial meniscus, current injury, left knee, initial encounter: Secondary | ICD-10-CM | POA: Diagnosis not present

## 2018-07-15 DIAGNOSIS — X58XXXA Exposure to other specified factors, initial encounter: Secondary | ICD-10-CM | POA: Diagnosis not present

## 2018-07-15 DIAGNOSIS — G8929 Other chronic pain: Secondary | ICD-10-CM | POA: Diagnosis present

## 2018-07-15 DIAGNOSIS — M25562 Pain in left knee: Secondary | ICD-10-CM

## 2018-07-16 ENCOUNTER — Ambulatory Visit: Payer: Self-pay | Admitting: Family Medicine

## 2018-07-16 ENCOUNTER — Telehealth: Payer: Self-pay | Admitting: Urology

## 2018-07-16 ENCOUNTER — Other Ambulatory Visit: Payer: Self-pay | Admitting: Family Medicine

## 2018-07-16 NOTE — Telephone Encounter (Signed)
Pt needs refills on testoterone pump at [facility] out patient pharmacy.

## 2018-07-16 NOTE — Telephone Encounter (Signed)
Please print and sign Rx. Thank you

## 2018-07-18 MED ORDER — TESTOSTERONE 50 MG/5GM (1%) TD GEL: 5.0000 g | Freq: Every day | TRANSDERMAL | 2 refills | Status: DC

## 2018-07-23 ENCOUNTER — Other Ambulatory Visit: Payer: Self-pay

## 2018-07-23 ENCOUNTER — Encounter
Admission: RE | Admit: 2018-07-23 | Discharge: 2018-07-23 | Disposition: A | Discharge status: Home / Self Care | Payer: No Typology Code available for payment source | Source: Ambulatory Visit | Attending: Orthopedic Surgery | Admitting: Orthopedic Surgery

## 2018-07-23 DIAGNOSIS — F419 Anxiety disorder, unspecified: Secondary | ICD-10-CM | POA: Diagnosis not present

## 2018-07-23 DIAGNOSIS — Y929 Unspecified place or not applicable: Secondary | ICD-10-CM | POA: Diagnosis not present

## 2018-07-23 DIAGNOSIS — N400 Enlarged prostate without lower urinary tract symptoms: Secondary | ICD-10-CM | POA: Diagnosis not present

## 2018-07-23 DIAGNOSIS — S83242A Other tear of medial meniscus, current injury, left knee, initial encounter: Secondary | ICD-10-CM | POA: Diagnosis not present

## 2018-07-23 DIAGNOSIS — X58XXXA Exposure to other specified factors, initial encounter: Secondary | ICD-10-CM | POA: Diagnosis not present

## 2018-07-23 DIAGNOSIS — M1712 Unilateral primary osteoarthritis, left knee: Secondary | ICD-10-CM | POA: Diagnosis not present

## 2018-07-23 DIAGNOSIS — Z79899 Other long term (current) drug therapy: Secondary | ICD-10-CM | POA: Diagnosis not present

## 2018-07-23 DIAGNOSIS — I10 Essential (primary) hypertension: Secondary | ICD-10-CM | POA: Diagnosis not present

## 2018-07-23 DIAGNOSIS — F329 Major depressive disorder, single episode, unspecified: Secondary | ICD-10-CM | POA: Diagnosis not present

## 2018-07-23 HISTORY — DX: Gastro-esophageal reflux disease without esophagitis: K21.9

## 2018-07-23 NOTE — Patient Instructions (Signed)
  Your procedure is scheduled on: Monday July 26, 2018 @ 10:30 am Report to Same Day Surgery 2nd floor medical mall [facility] Entrance-take elevator on left to 2nd floor.  Check in with surgery information desk.)  Remember: Instructions that are not followed completely may result in serious medical risk, up to and including death, or upon the discretion of your surgeon and anesthesiologist your surgery may need to be rescheduled.    _x___ 1. Do not eat food (including mints, candies, chewing gum) after midnight the night before your procedure. You may drink clear liquids up to 2 hours before you are scheduled to arrive at the hospital for your procedure.  Do not drink clear liquids within 2 hours of your scheduled arrival to the hospital.  Clear liquids include  --Water or Apple juice without pulp  --Clear carbohydrate beverage such as Gatorade  --Black Coffee or Clear Tea (No milk, no creamers, do not add anything to the coffee or tea)    __x__ 2. No Alcohol for 24 hours before or after surgery.   __x__ 3. No Smoking or e-cigarettes for 24 prior to surgery.  Do not use any chewable tobacco products for at least 6 hour prior to surgery   __x__ 4. Notify your doctor if there is any change in your medical condition (cold, fever, infections).   __x__ 5. On the morning of surgery brush your teeth with toothpaste and water.  You may rinse your mouth with mouth wash if you wish.  Do not swallow any toothpaste or mouthwash.  Please read over the following fact sheets that you were given:   [facility] Preparing for Surgery and or MRSA Information    __x__ Use CHG Soap or sage wipes as directed on instruction sheet    Do not wear jewelry.  Do not wear lotions, powders, deodorant, or perfumes.   Do not shave below the face/neck 48 hours prior to surgery.   Do not bring valuables to the hospital.    [facility] is not responsible for any belongings or valuables.               Contacts,  dentures or bridgework may not be worn into surgery.  For patients discharged on the day of surgery, you will NOT be permitted to drive yourself home.   _x___ Take anti-hypertensive listed below, cardiac, seizure, asthma, anti-reflux and psychiatric medicines. These include:  1. Omeprazole/Prilosec  2. Amlodipine/Norvasc  3. Sertraline/Zoloft  4. Testosterone/Androgel  _x___ Bring C-Pap to the hospital.   _x___ Follow recommendations from Cardiologist, Pulmonologist or PCP regarding stopping Aspirin, Coumadin, Plavix ,Eliquis, Effient, or Pradaxa, and Pletal.  _x___ Stop Anti-inflammatories such as Advil, Aleve, Ibuprofen, Motrin, Naproxen, Naprosyn, Goodies powders or aspirin products. OK to take Tylenol and Celebrex.   _x___ NOW: Stop supplements until after surgery.  But may continue Vitamin D, Vitamin B, and multivitamin.

## 2018-07-25 MED ORDER — CEFAZOLIN SODIUM-DEXTROSE 2-4 GM/100ML-% IV SOLN
2.0000 g | Freq: Once | INTRAVENOUS | Status: AC
  Administered 2018-07-26: 2 g via INTRAVENOUS

## 2018-07-26 ENCOUNTER — Encounter: Payer: Self-pay | Admitting: Anesthesiology

## 2018-07-26 ENCOUNTER — Ambulatory Visit
Admission: RE | Admit: 2018-07-26 | Discharge: 2018-07-26 | Disposition: A | Discharge status: Home / Self Care | Payer: No Typology Code available for payment source | Source: Ambulatory Visit | Attending: Orthopedic Surgery | Admitting: Orthopedic Surgery

## 2018-07-26 ENCOUNTER — Ambulatory Visit: Payer: No Typology Code available for payment source | Admitting: Anesthesiology

## 2018-07-26 ENCOUNTER — Encounter
Admission: RE | Disposition: A | Discharge status: Home / Self Care | Payer: Self-pay | Source: Ambulatory Visit | Attending: Orthopedic Surgery

## 2018-07-26 DIAGNOSIS — S83242A Other tear of medial meniscus, current injury, left knee, initial encounter: Secondary | ICD-10-CM | POA: Insufficient documentation

## 2018-07-26 DIAGNOSIS — I10 Essential (primary) hypertension: Secondary | ICD-10-CM | POA: Insufficient documentation

## 2018-07-26 DIAGNOSIS — Y929 Unspecified place or not applicable: Secondary | ICD-10-CM | POA: Insufficient documentation

## 2018-07-26 DIAGNOSIS — Z79899 Other long term (current) drug therapy: Secondary | ICD-10-CM | POA: Insufficient documentation

## 2018-07-26 DIAGNOSIS — M1712 Unilateral primary osteoarthritis, left knee: Secondary | ICD-10-CM | POA: Insufficient documentation

## 2018-07-26 DIAGNOSIS — F329 Major depressive disorder, single episode, unspecified: Secondary | ICD-10-CM | POA: Insufficient documentation

## 2018-07-26 DIAGNOSIS — N400 Enlarged prostate without lower urinary tract symptoms: Secondary | ICD-10-CM | POA: Insufficient documentation

## 2018-07-26 DIAGNOSIS — F419 Anxiety disorder, unspecified: Secondary | ICD-10-CM | POA: Insufficient documentation

## 2018-07-26 DIAGNOSIS — X58XXXA Exposure to other specified factors, initial encounter: Secondary | ICD-10-CM | POA: Insufficient documentation

## 2018-07-26 HISTORY — PX: KNEE ARTHROSCOPY WITH MEDIAL MENISECTOMY: SHX5651

## 2018-07-26 SURGERY — ARTHROSCOPY, KNEE, WITH MEDIAL MENISCECTOMY
Anesthesia: General | Laterality: Left

## 2018-07-26 MED ORDER — LACTATED RINGERS IV SOLN
INTRAVENOUS | Status: DC | PRN
  Administered 2018-07-26: 4 mL

## 2018-07-26 MED ORDER — ACETAMINOPHEN 10 MG/ML IV SOLN
INTRAVENOUS | Status: AC
  Filled 2018-07-26: qty 100

## 2018-07-26 MED ORDER — ACETAMINOPHEN 500 MG PO TABS: 1000.0000 mg | ORAL_TABLET | Freq: Three times a day (TID) | ORAL | 2 refills | Status: DC

## 2018-07-26 MED ORDER — LIDOCAINE HCL (CARDIAC) PF 100 MG/5ML IV SOSY
PREFILLED_SYRINGE | INTRAVENOUS | Status: DC | PRN
  Administered 2018-07-26: 100 mg via INTRAVENOUS

## 2018-07-26 MED ORDER — HYDROCODONE-ACETAMINOPHEN 5-325 MG PO TABS: 1.0000 | ORAL_TABLET | ORAL | 0 refills | Status: DC | PRN

## 2018-07-26 MED ORDER — SEVOFLURANE IN SOLN
RESPIRATORY_TRACT | Status: AC
  Filled 2018-07-26: qty 250

## 2018-07-26 MED ORDER — BUPIVACAINE HCL (PF) 0.5 % IJ SOLN
INTRAMUSCULAR | Status: DC | PRN
  Administered 2018-07-26: 7 mL

## 2018-07-26 MED ORDER — ONDANSETRON HCL 4 MG/2ML IJ SOLN: 4.0000 mg | Freq: Once | INTRAMUSCULAR | Status: DC | PRN

## 2018-07-26 MED ORDER — LACTATED RINGERS IV SOLN
INTRAVENOUS | Status: DC
  Administered 2018-07-26: 13:00:00 via INTRAVENOUS

## 2018-07-26 MED ORDER — ONDANSETRON HCL 4 MG/2ML IJ SOLN
INTRAMUSCULAR | Status: AC
  Filled 2018-07-26: qty 2

## 2018-07-26 MED ORDER — ACETAMINOPHEN 10 MG/ML IV SOLN
INTRAVENOUS | Status: DC | PRN
  Administered 2018-07-26: 1000 mg via INTRAVENOUS

## 2018-07-26 MED ORDER — PROPOFOL 10 MG/ML IV BOLUS
INTRAVENOUS | Status: DC | PRN
  Administered 2018-07-26 (×2): 20 mg via INTRAVENOUS
  Administered 2018-07-26: 100 mg via INTRAVENOUS

## 2018-07-26 MED ORDER — LIDOCAINE HCL URETHRAL/MUCOSAL 2 % EX GEL
CUTANEOUS | Status: AC
  Filled 2018-07-26: qty 5

## 2018-07-26 MED ORDER — GLYCOPYRROLATE 0.2 MG/ML IJ SOLN
INTRAMUSCULAR | Status: DC | PRN
  Administered 2018-07-26: 0.2 mg via INTRAVENOUS

## 2018-07-26 MED ORDER — HYDROCODONE-ACETAMINOPHEN 5-325 MG PO TABS
ORAL_TABLET | ORAL | Status: AC
  Filled 2018-07-26: qty 1

## 2018-07-26 MED ORDER — FENTANYL CITRATE (PF) 100 MCG/2ML IJ SOLN: 25.0000 ug | INTRAMUSCULAR | Status: DC | PRN

## 2018-07-26 MED ORDER — FENTANYL CITRATE (PF) 100 MCG/2ML IJ SOLN
INTRAMUSCULAR | Status: AC
  Filled 2018-07-26: qty 2

## 2018-07-26 MED ORDER — GLYCOPYRROLATE 0.2 MG/ML IJ SOLN: INTRAMUSCULAR | Status: DC | PRN

## 2018-07-26 MED ORDER — MIDAZOLAM HCL 2 MG/2ML IJ SOLN
INTRAMUSCULAR | Status: AC
  Filled 2018-07-26: qty 2

## 2018-07-26 MED ORDER — LIDOCAINE-EPINEPHRINE 1 %-1:100000 IJ SOLN
INTRAMUSCULAR | Status: DC | PRN
  Administered 2018-07-26: 10 mL

## 2018-07-26 MED ORDER — PROPOFOL 10 MG/ML IV BOLUS
INTRAVENOUS | Status: AC
  Filled 2018-07-26: qty 20

## 2018-07-26 MED ORDER — ONDANSETRON HCL 4 MG/2ML IJ SOLN
INTRAMUSCULAR | Status: DC | PRN
  Administered 2018-07-26: 4 mg via INTRAVENOUS

## 2018-07-26 MED ORDER — ONDANSETRON 4 MG PO TBDP: 4.0000 mg | ORAL_TABLET | Freq: Three times a day (TID) | ORAL | 0 refills | Status: DC | PRN

## 2018-07-26 MED ORDER — CEFAZOLIN SODIUM-DEXTROSE 2-4 GM/100ML-% IV SOLN
INTRAVENOUS | Status: AC
Start: 2018-07-26 — End: 2018-07-26
  Filled 2018-07-26: qty 100

## 2018-07-26 MED ORDER — FENTANYL CITRATE (PF) 100 MCG/2ML IJ SOLN
INTRAMUSCULAR | Status: DC | PRN
  Administered 2018-07-26: 25 ug via INTRAVENOUS
  Administered 2018-07-26: 50 ug via INTRAVENOUS
  Administered 2018-07-26: 25 ug via INTRAVENOUS

## 2018-07-26 MED ORDER — HYDROCODONE-ACETAMINOPHEN 5-325 MG PO TABS
1.0000 | ORAL_TABLET | Freq: Four times a day (QID) | ORAL | Status: DC | PRN
  Administered 2018-07-26 (×2): 1 via ORAL

## 2018-07-26 MED ORDER — MIDAZOLAM HCL 2 MG/2ML IJ SOLN
INTRAMUSCULAR | Status: DC | PRN
  Administered 2018-07-26: 2 mg via INTRAVENOUS

## 2018-07-26 MED ORDER — IBUPROFEN 800 MG PO TABS: 800.0000 mg | ORAL_TABLET | Freq: Three times a day (TID) | ORAL | 0 refills | Status: AC

## 2018-07-26 SURGICAL SUPPLY — 56 items
ADAPTER IRRIG TUBE 2 SPIKE SOL (ADAPTER) ×2 IMPLANT
ADPR TBG 2 SPK PMP STRL ASCP (ADAPTER)
ADPR TBG Y 2 SPK STRL DISP CNT (ADAPTER) ×1
BANDAGE ACE 6X5 VEL STRL LF (GAUZE/BANDAGES/DRESSINGS) ×2 IMPLANT
BLADE SURG SZ11 CARB STEEL (BLADE) ×2 IMPLANT
BNDG COHESIVE 6X5 TAN STRL LF (GAUZE/BANDAGES/DRESSINGS) ×2 IMPLANT
BNDG ESMARK 6X12 TAN STRL LF (GAUZE/BANDAGES/DRESSINGS) ×2 IMPLANT
BUR RADIUS 3.5 (BURR) IMPLANT
BUR RADIUS 4.0X18.5 (BURR) IMPLANT
CAST PADDING 6X4YD ST 30248 (SOFTGOODS) ×1
CHLORAPREP W/TINT 26ML (MISCELLANEOUS) ×2 IMPLANT
COOLER POLAR GLACIER W/PUMP (MISCELLANEOUS) ×2 IMPLANT
CUFF TOURN 24 STER (MISCELLANEOUS) IMPLANT
CUFF TOURN 30 STER DUAL PORT (MISCELLANEOUS) ×1 IMPLANT
DEVICE SUCT BLK HOLE OR FLOOR (MISCELLANEOUS) ×1 IMPLANT
DRAPE IMP U-DRAPE 54X76 (DRAPES) ×2 IMPLANT
DRAPE LEGGINS SURG 28X43 STRL (DRAPES) IMPLANT
ELECT REM PT RETURN 9FT ADLT (ELECTROSURGICAL)
ELECTRODE REM PT RTRN 9FT ADLT (ELECTROSURGICAL) IMPLANT
GAUZE SPONGE 4X4 12PLY STRL (GAUZE/BANDAGES/DRESSINGS) ×2 IMPLANT
GLOVE BIOGEL PI IND STRL 8 (GLOVE) ×1 IMPLANT
GLOVE BIOGEL PI INDICATOR 8 (GLOVE) ×1
GLOVE SURG ORTHO 8.0 STRL STRW (GLOVE) ×4 IMPLANT
GOWN STRL REUS W/ TWL LRG LVL3 (GOWN DISPOSABLE) ×1 IMPLANT
GOWN STRL REUS W/ TWL XL LVL3 (GOWN DISPOSABLE) ×1 IMPLANT
GOWN STRL REUS W/TWL LRG LVL3 (GOWN DISPOSABLE) ×2
GOWN STRL REUS W/TWL XL LVL3 (GOWN DISPOSABLE) ×2
IV LACTATED RINGER IRRG 3000ML (IV SOLUTION) ×8
IV LR IRRIG 3000ML ARTHROMATIC (IV SOLUTION) ×4 IMPLANT
KIT TURNOVER KIT A (KITS) ×2 IMPLANT
MANIFOLD NEPTUNE II (INSTRUMENTS) ×2 IMPLANT
MAT BLUE FLOOR 46X72 FLO (MISCELLANEOUS) ×4 IMPLANT
NDL MAYO CATGUT SZ5 (NEEDLE)
NDL SUT 5 .5 CRC TPR PNT MAYO (NEEDLE) IMPLANT
NEEDLE HYPO 22GX1.5 SAFETY (NEEDLE) ×3 IMPLANT
PACK ARTHROSCOPY KNEE (MISCELLANEOUS) ×2 IMPLANT
PAD ABD DERMACEA PRESS 5X9 (GAUZE/BANDAGES/DRESSINGS) ×4 IMPLANT
PAD WRAPON POLAR KNEE (MISCELLANEOUS) ×1 IMPLANT
PADDING CAST COTTON 6X4 ST (SOFTGOODS) ×1 IMPLANT
PENCIL ELECTRO HAND CTR (MISCELLANEOUS) IMPLANT
PORT APPOLLO RF 90DEGREE MULTI (SURGICAL WAND) ×1 IMPLANT
SET ADAPTER IRRIG ARTHO 72IN Y (ADAPTER) ×1 IMPLANT
SET TUBE SUCT SHAVER OUTFL 24K (TUBING) ×1 IMPLANT
SET TUBE TIP INTRA-ARTICULAR (MISCELLANEOUS) ×1 IMPLANT
STRIP CLOSURE SKIN 1/2X4 (GAUZE/BANDAGES/DRESSINGS) IMPLANT
SUT ETHILON 3-0 FS-10 30 BLK (SUTURE) ×2
SUT MNCRL AB 4-0 PS2 18 (SUTURE) IMPLANT
SUT VIC AB 0 CT2 27 (SUTURE) IMPLANT
SUT VIC AB 2-0 CT2 27 (SUTURE) IMPLANT
SUTURE EHLN 3-0 FS-10 30 BLK (SUTURE) ×1 IMPLANT
TOWEL OR 17X26 4PK STRL BLUE (TOWEL DISPOSABLE) ×4 IMPLANT
TUBING ARTHRO INFLOW-ONLY STRL (TUBING) ×1 IMPLANT
TUBING REDEUCE PUMP W/CON 8IN (MISCELLANEOUS) ×1 IMPLANT
WAND HAND CNTRL MULTIVAC 50 (MISCELLANEOUS) IMPLANT
WAND HAND CNTRL MULTIVAC 90 (MISCELLANEOUS) IMPLANT
WRAPON POLAR PAD KNEE (MISCELLANEOUS) ×2

## 2018-07-26 NOTE — Anesthesia Post-op Follow-up Note (Signed)
Anesthesia QCDR form completed.        

## 2018-07-26 NOTE — Anesthesia Procedure Notes (Addendum)
Procedure Name: LMA Insertion Date/Time: 07/26/2018 12:44 PM Performed by: Allean Found, CRNA Pre-anesthesia Checklist: Patient identified, Patient being monitored, Timeout performed, Emergency Drugs available and Suction available Patient Re-evaluated:Patient Re-evaluated prior to induction Oxygen Delivery Method: Circle system utilized Preoxygenation: Pre-oxygenation with 100% oxygen Induction Type: IV induction Ventilation: Mask ventilation without difficulty LMA: LMA inserted LMA Size: 5.0 Tube type: Oral Number of attempts: 1 Placement Confirmation: positive ETCO2 and breath sounds checked- equal and bilateral Tube secured with: Tape Dental Injury: Teeth and Oropharynx as per pre-operative assessment

## 2018-07-26 NOTE — Op Note (Signed)
Operative Note    SURGERY DATE: 07/26/2018   PRE-OP DIAGNOSIS:  1. Left medial meniscus tear 2. Left patella and medial compartment degenerative changes   POST-OP DIAGNOSIS:  1. Left medial meniscus tear 2. Left patella and medial compartment degenerative changes   PROCEDURES:  1. Left knee arthroscopy, partial medial meniscectomy 2. Left knee chondroplasty of chondroplasty of patellofemoral and medial compartments   SURGEON: Cato Mulligan, MD   ANESTHESIA: Gen   ESTIMATED BLOOD LOSS: minimal   TOTAL IV FLUIDS: per anesthesia   INDICATION(S):  Daniel Calderon is a 52 y.o. male with signs and symptoms as well as MRI finding of medial meniscus tear. There were minimal degenerative changes noted on imaging. After discussion of risks, benefits, and alternatives to surgery, the patient elected to proceed.   OPERATIVE FINDINGS:    Examination under anesthesia: A careful examination under anesthesia was performed.  Passive range of motion was: Hyperextension: 1.  Extension: 0.  Flexion: 130.  Lachman: normal. Pivot Shift: normal.  Posterior drawer: normal.  Varus stability in full extension: normal.  Varus stability in 30 degrees of flexion: normal.  Valgus stability in full extension: normal.  Valgus stability in 30 degrees of flexion: normal.   Intra-operative findings: A thorough arthroscopic examination of the knee was performed.  The findings are: 1. Suprapatellar pouch: Normal 2. Undersurface of median ridge: Grade 1 degenerative changes 3. Medial patellar facet: Grade 1 degenerative changes 4. Lateral patellar facet: Grade 1 degenerative changes 5. Trochlea: Normal 6. Lateral gutter/popliteus tendon: Normal 7. Hoffa's fat pad: Inflamed 8. Medial gutter/plica: Normal 9. ACL: Normal 10. PCL: Normal 11. Medial meniscus: Horizontal tear of the posterior horn extending to the body affecting ~80% of the meniscus width 12. Medial compartment cartilage: Grade 1-2 degenerative  changes over [place] and tibial plateau 13. Lateral meniscus: normal 14. Lateral compartment cartilage: Grade 1 changes of the tibial plateau   OPERATIVE REPORT:     I identified [name] in the pre-operative holding area. I marked the operative knee with my initials. I reviewed the risks and benefits of the proposed surgical intervention and the patient (and/or patient's guardian) wished to proceed. The patient was transferred to the operative suite and placed in the supine position with all bony prominences padded.  Anesthesia was administered. Appropriate IV antibiotics were administered prior to incision. The extremity was then prepped and draped in standard fashion. A time out was performed confirming the correct extremity, correct patient, and correct procedure.   Arthroscopy portals were marked. Local anesthetic was injected to the planned portal sites. The anterolateral portal was established with an 11 blade.      The arthroscope was placed in the anterolateral portal and then into the suprapatellar pouch.  A diagnostic knee scope was completed with the above findings. The medial meniscus tear was identified.   Next the medial portal was established under needle localization. The MCL was pie-crusted to improve visualization of the posterior horn. The meniscal tear was debrided using an arthroscopic biter and an oscillating shaver until the meniscus had stable borders. There was a small ~3-73mm rim of posterior horn menicscus width remaining after debridement. A chondroplasty was performed of the medial compartment and patellofemoral compartment such that there were stable cartilage edges without any loose fragments of cartilage. Arthroscopic fluid was removed from the joint.   The portals were closed with 3-0 Nylon suture. Sterile dressings included Xeroform, 4x4s, Sof-Rol, and Bias wrap. A Polarcare was placed.  The patient  was then awakened and taken to the PACU hemodynamically stable  without complication.      POSTOPERATIVE PLAN: The patient will be discharged home today once they meet PACU criteria. Aspirin 325 mg daily was prescribed for 2 weeks for DVT prophylaxis.  Physical therapy will start on POD#3-4. Weight-bearing as tolerated. They will follow up in 2 weeks per protocol.

## 2018-07-26 NOTE — Anesthesia Postprocedure Evaluation (Signed)
Anesthesia Post Note  Patient: Daniel Calderon  Procedure(s) Performed: KNEE ARTHROSCOPY WITH PARTIAL MEDIAL MENISECTOMY (Left )  Patient location during evaluation: PACU Anesthesia Type: General Level of consciousness: awake and alert Pain management: pain level controlled Vital Signs Assessment: post-procedure vital signs reviewed and stable Respiratory status: spontaneous breathing, nonlabored ventilation, respiratory function stable and patient connected to nasal cannula oxygen Cardiovascular status: blood pressure returned to baseline and stable Postop Assessment: no apparent nausea or vomiting Anesthetic complications: no     Last Vitals:  Vitals:   07/26/18 1516 07/26/18 1603  BP: 137/87 127/79  Pulse: 76 65  Resp: 16 16  Temp: (!) 36.3 C (!) 36.2 C  SpO2: 97% 98%    Last Pain:  Vitals:   07/26/18 1603  TempSrc: Temporal  PainSc: [place]

## 2018-07-26 NOTE — H&P (Signed)
Paper H&P to be scanned into permanent record. H&P reviewed. No significant changes noted.  

## 2018-07-26 NOTE — Anesthesia Preprocedure Evaluation (Signed)
Anesthesia Evaluation  Patient identified by MRN, date of birth, ID band Patient awake    Reviewed: Allergy & Precautions, NPO status , Patient's Chart, lab work & pertinent test results, reviewed documented beta blocker date and time   Airway Mallampati: III  TM Distance: >3 FB     Dental  (+) Chipped   Pulmonary sleep apnea ,           Cardiovascular hypertension,      Neuro/Psych  Headaches, PSYCHIATRIC DISORDERS Anxiety Depression  Neuromuscular disease    GI/Hepatic GERD  Controlled,  Endo/Other    Renal/GU Renal disease     Musculoskeletal   Abdominal   Peds  Hematology   Anesthesia Other Findings Obese.  Reproductive/Obstetrics                             Anesthesia Physical Anesthesia Plan  ASA: III  Anesthesia Plan: General   Post-op Pain Management:    Induction: Intravenous  PONV Risk Score and Plan:   Airway Management Planned: LMA  Additional Equipment:   Intra-op Plan:   Post-operative Plan:   Informed Consent: I have reviewed the patients History and Physical, chart, labs and discussed the procedure including the risks, benefits and alternatives for the proposed anesthesia with the patient or authorized representative who has indicated his/her understanding and acceptance.     Plan Discussed with: CRNA  Anesthesia Plan Comments:         Anesthesia Quick Evaluation

## 2018-07-26 NOTE — Transfer of Care (Signed)
Immediate Anesthesia Transfer of Care Note  Patient: Daniel Calderon  Procedure(s) Performed: KNEE ARTHROSCOPY WITH PARTIAL MEDIAL MENISECTOMY (Left )  Patient Location: PACU  Anesthesia Type:General  Level of Consciousness: sedated  Airway & Oxygen Therapy: Patient Spontanous Breathing and Patient connected to face mask oxygen  Post-op Assessment: Report given to RN and Post -op Vital signs reviewed and stable  Post vital signs: Reviewed and stable  Last Vitals:  Vitals Value Taken Time  BP 125/87 07/26/2018  2:16 PM  Temp 36.1 C 07/26/2018  2:16 PM  Pulse 68 07/26/2018  2:18 PM  Resp 14 07/26/2018  2:18 PM  SpO2 98 % 07/26/2018  2:18 PM  Vitals shown include unvalidated device data.  Last Pain:  Vitals:   07/26/18 1041  TempSrc: Oral  PainSc: 0-No pain         Complications: No apparent anesthesia complications

## 2018-07-26 NOTE — Discharge Instructions (Addendum)
Arthroscopic Knee Surgery - Partial Meniscectomy   Post-Op Instructions   1. Bracing or crutches: Crutches will be provided at the time of discharge from the surgery center if you do not already have them.   2. Ice: You may be provided with a device (Polar Care) that allows you to ice the affected area effectively. Otherwise you can ice manually.    3. Driving:  Plan on not driving for at least two weeks. Please note that you are advised NOT to drive while taking narcotic pain medications as you may be impaired and unsafe to drive.   4. Activity: Ankle pumps several times an hour while awake to prevent blood clots. Weight bearing: as tolerated. Use crutches for as needed (usually ~1 week or less) until pain allows you to ambulate without a limp. Bending and straightening the knee is unlimited. Elevate knee above heart level as much as possible for one week. Avoid standing more than 5 minutes (consecutively) for the first week.  Avoid long distance travel for 2 weeks.  5. Medications:  - You have been provided a prescription for narcotic pain medicine. After surgery, take 1-2 narcotic tablets every 4 hours if needed for severe pain.  - You may take up to 3000mg/day of tylenol (acetaminophen). You can take 1000mg 3x/day. Please check your narcotic. If you have acetaminophen in your narcotic (each tablet will be 325mg), be careful not to exceed a total of 3000mg/day of acetaminophen.  - A prescription for anti-nausea medication will be provided in case the narcotic medicine or anesthesia causes nausea - take 1 tablet every 6 hours only if nauseated.  - Take ibuprofen 800 mg every 8 hours WITH food to reduce post-operative knee swelling. DO NOT STOP IBUPROFEN POST-OP UNTIL INSTRUCTED TO DO SO at first post-op office visit (10-14 days after surgery). However, please discontinue if you have any abdominal discomfort after taking this.  - Take enteric coated aspirin 325 mg once daily for 2 weeks to prevent  blood clots.    6. Bandages: The physical therapist should change the bandages at the first post-op appointment. If needed, the dressing supplies have been provided to you.   7. Physical Therapy: 1-2 times per week for 6 weeks. Therapy typically starts on post operative Day 3 or 4. You have been provided an order for physical therapy. The therapist will provide home exercises.   8. Work: May return to full work usually around 2 weeks after 1st post-operative visit. May do light duty/desk job in approximately 1-2 weeks when off of narcotics, pain is well-controlled, and swelling has decreased. Labor intensive jobs may require 4-6 weeks to return.      9. Post-Op Appointments: Your first post-op appointment will be with Dr. Patel in approximately 2 weeks time.    If you find that they have not been scheduled please call the Orthopaedic Appointment front desk at [phone].  AMBULATORY SURGERY  DISCHARGE INSTRUCTIONS   1) The drugs that you were given will stay in your system until tomorrow so for the next 24 hours you should not:  A) Drive an automobile B) Make any legal decisions C) Drink any alcoholic beverage   2) You may resume regular meals tomorrow.  Today it is better to start with liquids and gradually work up to solid foods.  You may eat anything you prefer, but it is better to start with liquids, then soup and crackers, and gradually work up to solid foods.   3) Please notify your   doctor immediately if you have any unusual bleeding, trouble breathing, redness and pain at the surgery site, drainage, fever, or pain not relieved by medication.    4) Additional Instructions:        Please contact your physician with any problems or Same Day Surgery at [phone], Monday through Friday 6 am to 4 pm, or [place] [facility] number at [phone].  

## 2018-07-27 ENCOUNTER — Encounter: Payer: Self-pay | Admitting: Orthopedic Surgery

## 2018-08-02 ENCOUNTER — Other Ambulatory Visit: Payer: Self-pay | Admitting: Family Medicine

## 2018-09-09 ENCOUNTER — Other Ambulatory Visit: Payer: No Typology Code available for payment source

## 2018-09-09 DIAGNOSIS — E291 Testicular hypofunction: Secondary | ICD-10-CM

## 2018-09-10 LAB — PSA: Prostate Specific Ag, Serum: 0.3 ng/mL (ref 0.0–4.0)

## 2018-09-10 LAB — HEMATOCRIT: HEMATOCRIT: 43.1 % (ref 37.5–51.0)

## 2018-09-13 ENCOUNTER — Other Ambulatory Visit: Payer: Self-pay | Admitting: Family Medicine

## 2018-09-13 ENCOUNTER — Telehealth: Payer: Self-pay

## 2018-09-13 NOTE — Telephone Encounter (Signed)
-----   Message from Abbie Sons, MD sent at 09/10/2018  7:26 AM EDT ----- Please add a testosterone level to these labs drawn yesterday. ----- Message ----- From: Lavone Neri Lab Results In Sent: 09/10/2018   5:41 AM EDT To: Abbie Sons, MD

## 2018-09-13 NOTE — Telephone Encounter (Signed)
Lab added on

## 2018-09-15 ENCOUNTER — Ambulatory Visit (INDEPENDENT_AMBULATORY_CARE_PROVIDER_SITE_OTHER): Payer: No Typology Code available for payment source | Admitting: Urology

## 2018-09-15 ENCOUNTER — Other Ambulatory Visit: Payer: Self-pay

## 2018-09-15 ENCOUNTER — Encounter: Payer: Self-pay | Admitting: Urology

## 2018-09-15 VITALS — BP 121/80 | HR 96 | Ht 74.0 in | Wt 210.0 lb

## 2018-09-15 DIAGNOSIS — E291 Testicular hypofunction: Secondary | ICD-10-CM | POA: Diagnosis not present

## 2018-09-15 MED ORDER — TESTOSTERONE 50 MG/5GM (1%) TD GEL: 5.0000 g | Freq: Every day | TRANSDERMAL | 2 refills | Status: DC

## 2018-09-15 NOTE — Progress Notes (Signed)
09/15/2018 9:34 AM   Daniel Calderon 02-Mar-1966 [id number]  Referring provider: [name], MD [address] [place], [place] [address]  Chief Complaint  Patient presents with  . Hypogonadism    HPI: 52 year old male presents for follow-up of hypogonadism.  He remains on AndroGel.  He notes good energy level and libido.  He had blood work drawn on 9/19.  His testosterone was 742 ng/dL.  PSA low at 0.3 and hematocrit normal at 43.1.  He has no bothersome lower urinary tract symptoms.  Denies breast tenderness/enlargement or lower extremity edema.   PMH: Past Medical History:  Diagnosis Date  . Allergy    Seasonal  . Anxiety   . Benign enlargement of prostate   . Depression   . Elevated BP   . Elevated transaminase level   . Failure of erection   . Fatigue   . Frequent headaches   . GERD (gastroesophageal reflux disease)   . Hypogonadism in male   . Kidney stones    History of kidney stones  . Migraine   . Sleep apnea    Currently uses cpap  . Thoracic outlet syndrome    Left Arm    Surgical History: Past Surgical History:  Procedure Laterality Date  . COLONOSCOPY WITH PROPOFOL N/A 11/11/2017   Procedure: COLONOSCOPY WITH PROPOFOL;  Surgeon: Lin Landsman, MD;  Location: [facility];  Service: Endoscopy;  Laterality: N/A;  . KNEE ARTHROSCOPY WITH MEDIAL MENISECTOMY Left 07/26/2018   Procedure: KNEE ARTHROSCOPY WITH PARTIAL MEDIAL MENISECTOMY;  Surgeon: Leim Fabry, MD;  Location: [facility];  Service: Orthopedics;  Laterality: Left;  . Left Shoulder Surgery  2011  . nerve block     2 in neck. 5 in back.  . NOSE SURGERY    . POLYPECTOMY  11/11/2017   Procedure: POLYPECTOMY;  Surgeon: Lin Landsman, MD;  Location: [facility];  Service: Endoscopy;;  . SCALENE NODE BIOPSY / EXCISION    . VASECTOMY      Home Medications:  Allergies as of 09/15/2018      Reactions   Erythromycin Nausea And Vomiting   Cephalexin  Other (See Comments)   Unknown   Dexamethasone Other (See Comments)   Unknown   Oxycodone-acetaminophen Other (See Comments)   Unknown   Prednisone Other (See Comments)   Unknown   Gabapentin Other (See Comments)   Aggressive   Ibuprofen Itching, Other (See Comments)   Can take in low doses per pt      Medication List        Accurate as of 09/15/18  9:34 AM. Always use your most recent med list.          acetaminophen 500 MG tablet Commonly known as:  TYLENOL Take 2 tablets (1,000 mg total) by mouth every 8 (eight) hours.   amLODipine 10 MG tablet Commonly known as:  NORVASC TAKE 1 TABLET BY MOUTH DAILY.   calcipotriene 0.005 % cream Commonly known as:  DOVONOX Apply 1 application topically daily as needed (for psoriasis).   fluticasone 50 MCG/ACT nasal spray Commonly known as:  FLONASE Place 2 sprays into both nostrils daily.   HYDROcodone-acetaminophen 5-325 MG tablet Commonly known as:  NORCO/VICODIN Take 1-2 tablets by mouth every 4 (four) hours as needed for moderate pain or severe pain.   multivitamin tablet Take 1 tablet by mouth daily.   omeprazole 20 MG capsule Commonly known as:  PRILOSEC TAKE 1 CAPSULE BY MOUTH DAILY  ondansetron 4 MG disintegrating tablet Commonly known as:  ZOFRAN-ODT Take 1 tablet (4 mg total) by mouth every 8 (eight) hours as needed for nausea or vomiting.   sertraline 100 MG tablet Commonly known as:  ZOLOFT TAKE 1 TABLET BY MOUTH ONCE DAILY   testosterone 50 MG/5GM (1%) Gel Commonly known as:  ANDROGEL Place 5 g onto the skin daily.       Allergies:  Allergies  Allergen Reactions  . Erythromycin Nausea And Vomiting  . Cephalexin Other (See Comments)    Unknown  . Dexamethasone Other (See Comments)    Unknown  . Oxycodone-Acetaminophen Other (See Comments)    Unknown  . Prednisone Other (See Comments)    Unknown  . Gabapentin Other (See Comments)    Aggressive  . Ibuprofen Itching and Other (See Comments)      Can take in low doses per pt    Family History: Family History  Problem Relation Age of Onset  . Hypertension Mother        Living  . Hearing loss Mother   . Heart disease Other   . Healthy Father        Living  . Diabetes Brother   . Stroke Paternal Uncle   . Heart attack Paternal Uncle   . Hearing loss Maternal Grandmother   . Healthy Son   . Healthy Daughter   . Kidney cancer Neg Hx   . Bladder Cancer Neg Hx   . Prostate cancer Neg Hx     Social History:  reports that he has never smoked. He has never used smokeless tobacco. He reports that he drinks about 3.0 standard drinks of alcohol per week. He reports that he does not use drugs.  ROS: UROLOGY Frequent Urination?: No Hard to postpone urination?: No Burning/pain with urination?: No Get up at night to urinate?: Yes Leakage of urine?: No Urine stream starts and stops?: No Trouble starting stream?: No Do you have to strain to urinate?: No Blood in urine?: No Urinary tract infection?: No Sexually transmitted disease?: No Injury to kidneys or bladder?: No Painful intercourse?: No Weak stream?: No Erection problems?: Yes Penile pain?: No  Gastrointestinal Nausea?: No Vomiting?: No Indigestion/heartburn?: No Diarrhea?: No Constipation?: No  Constitutional Fever: No Night sweats?: Yes Weight loss?: No Fatigue?: Yes  Skin Skin rash/lesions?: No Itching?: No  Eyes Blurred vision?: No Double vision?: No  Ears/Nose/Throat Sore throat?: No Sinus problems?: No  Hematologic/Lymphatic Swollen glands?: No Easy bruising?: No  Cardiovascular Leg swelling?: No Chest pain?: No  Respiratory Cough?: No Shortness of breath?: No  Endocrine Excessive thirst?: No  Musculoskeletal Back pain?: No Joint pain?: No  Neurological Headaches?: No Dizziness?: No  Psychologic Depression?: No Anxiety?: No  Physical Exam: BP 121/80   Pulse 96   Ht 6\' 2"  (1.88 m)   Wt 210 lb (95.3 kg)   BMI  26.96 kg/m   Constitutional:  Alert and oriented, No acute distress. HEENT: [place] AT, moist mucus membranes.  Trachea midline, no masses. Cardiovascular: No clubbing, cyanosis, or edema. Respiratory: Normal respiratory effort, no increased work of breathing. GI: Abdomen is soft, nontender, nondistended, no abdominal masses GU: No CVA tenderness.  Prostate 35 g, smooth without nodules Lymph: No cervical or inguinal lymphadenopathy. Skin: No rashes, bruises or suspicious lesions. Neurologic: Grossly intact, no focal deficits, moving all 4 extremities. Psychiatric: Normal mood and affect.   Assessment & Plan:   Hypogonadism doing well on TRT.  Blood work today looks good.  Follow-up  6 months for a testosterone level/hematocrit in 1 year for labs/exam.  AndroGel was refilled.  Return in about 1 year (around 09/16/2019) for Recheck.   Abbie Sons, [place] [address], [place], [place] [address] [phone]

## 2018-09-21 LAB — SPECIMEN STATUS REPORT

## 2018-09-21 LAB — TESTOSTERONE: Testosterone: 742 ng/dL (ref 264–916)

## 2018-10-17 IMAGING — CT CT HEAD CODE STROKE
3 series · 14 of 47 positions shown, 16 images · non-contrast
Comparison: CT head 02/23/2016

CLINICAL DATA: Code stroke. Blurred vision. Facial numbness and
left arm numbness.

EXAM:
CT HEAD WITHOUT CONTRAST
TECHNIQUE: Contiguous axial images were obtained from the base of the skull
through the vertex without intravenous contrast.

[Series 2: head wo · axial · 0.47mm/px · z∈[-93,+32]mm · 8 of 31 slices shown, 10 images]
[im 3/31  brain]
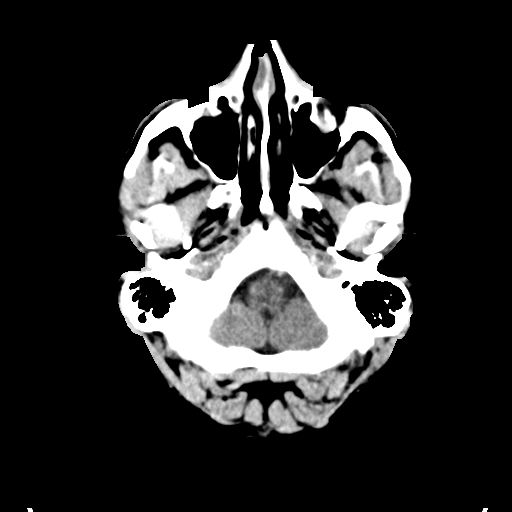
[im 3/31  bone]
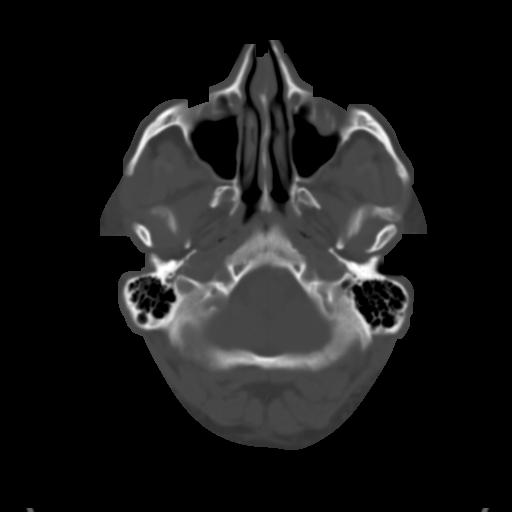
[im 7/31  brain]
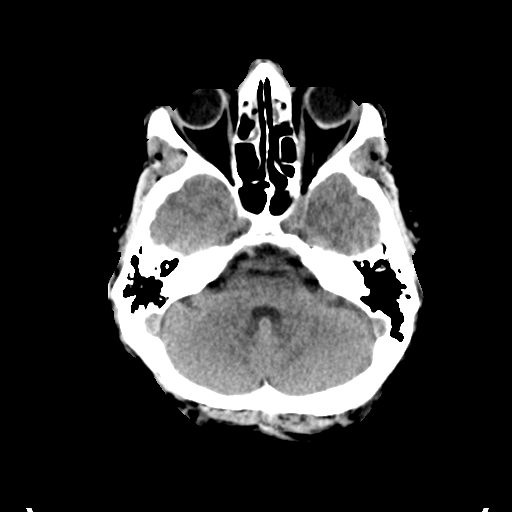
[im 10/31  brain]
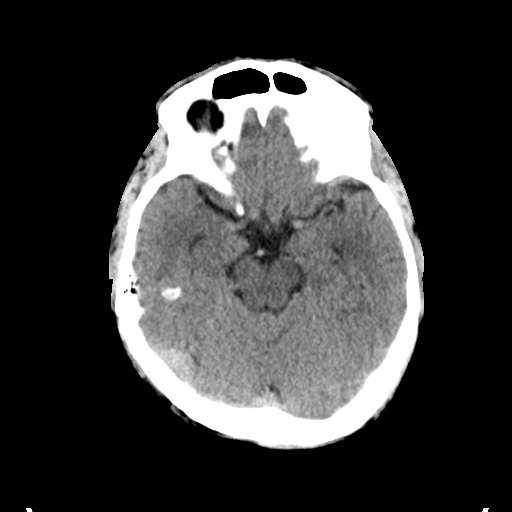
[im 14/31  brain]
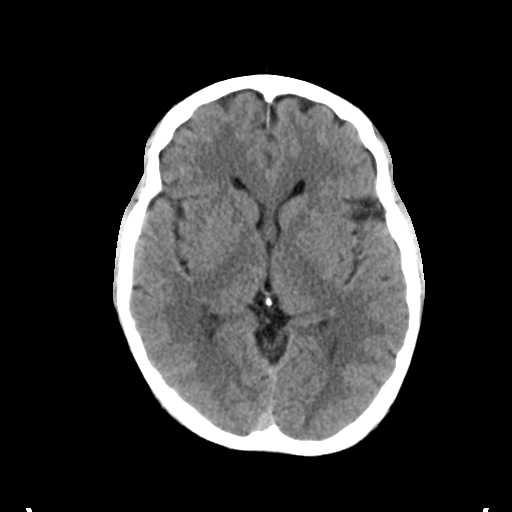
[im 17/31  brain]
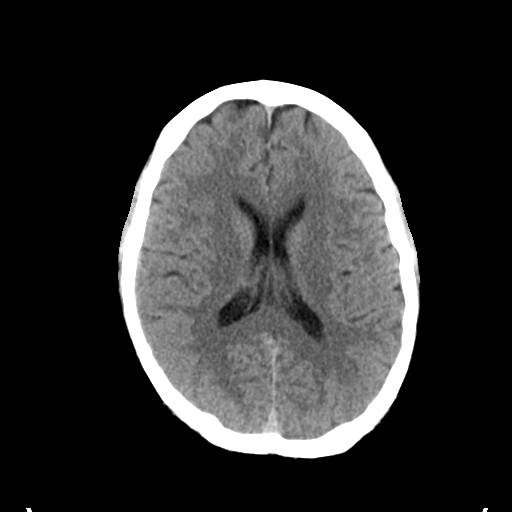
[im 17/31  bone]
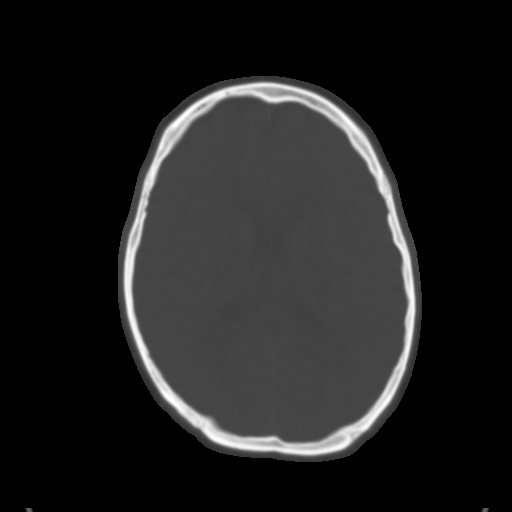
[im 21/31  brain]
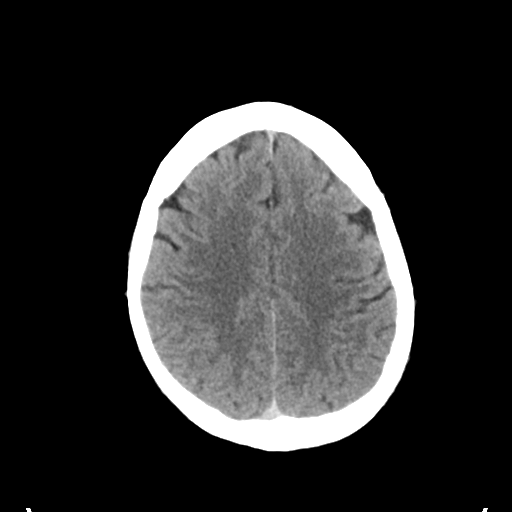
[im 24/31  brain]
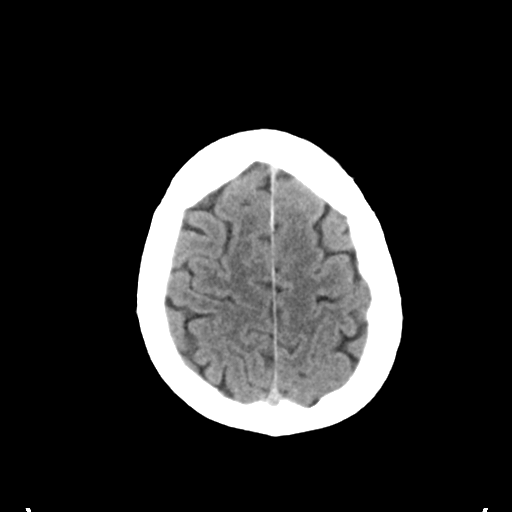
[im 28/31  brain]
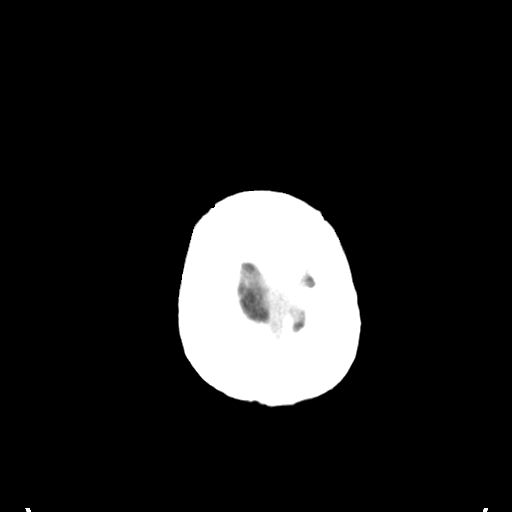

[Series 4: coronal soft tissue · coronal · 0.30mm/px · 3 of 63 slices shown]
[im 21/63  brain]
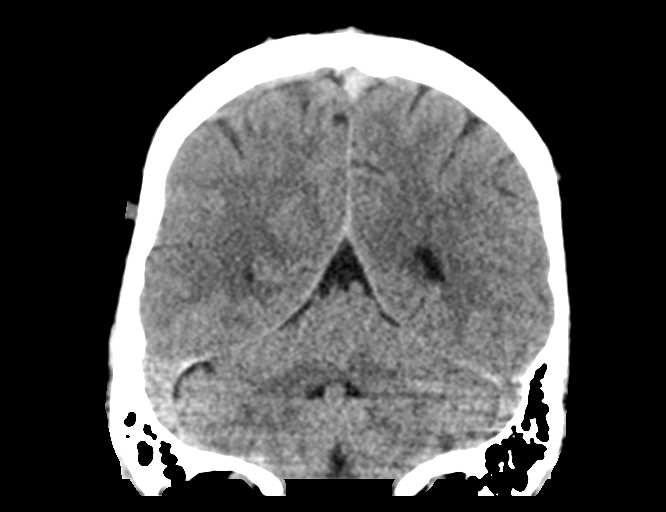
[im 28/63  brain]
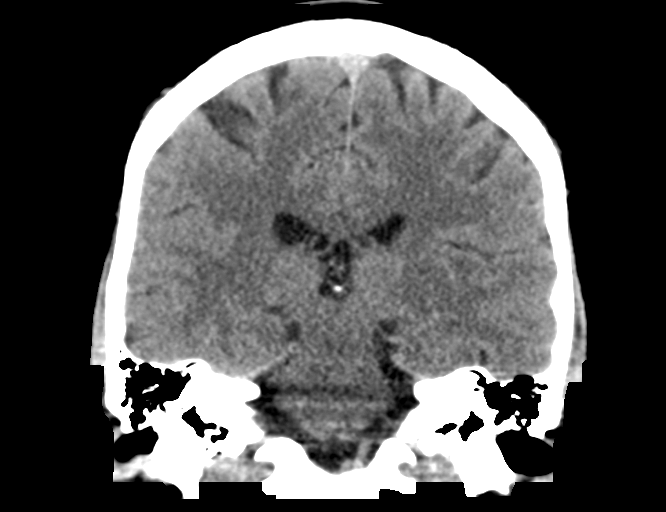
[im 35/63  brain]
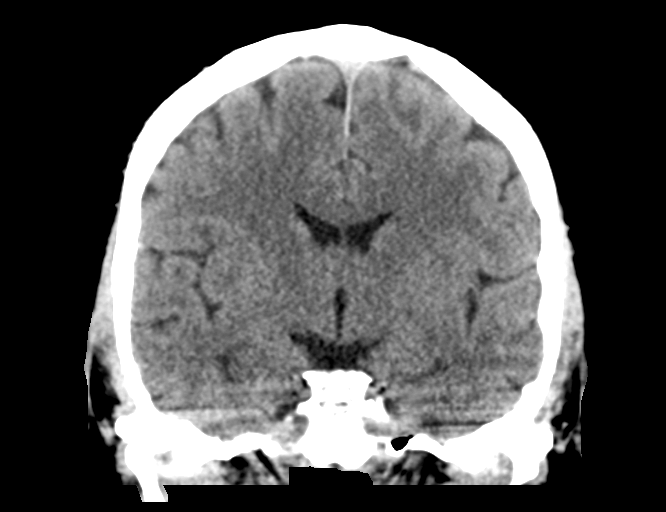

[Series 5: sagittal soft tissue · sagittal · 0.30mm/px · 3 of 50 slices shown]
[im 17/50  brain]
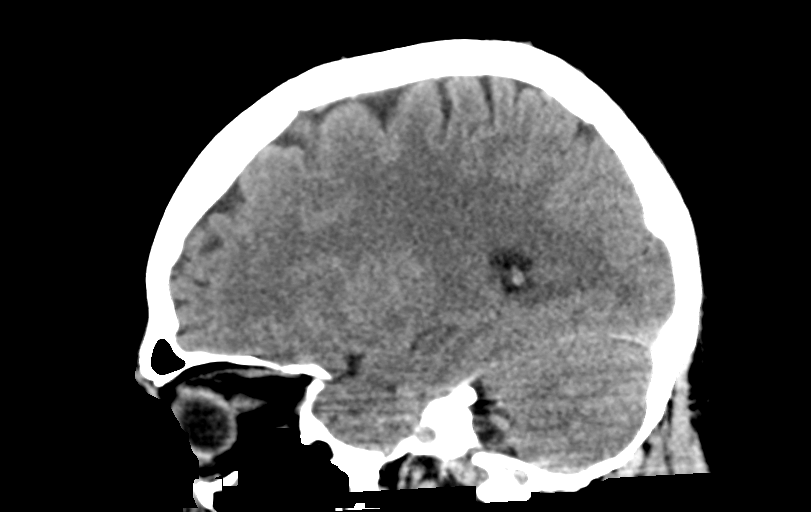
[im 25/50  brain]
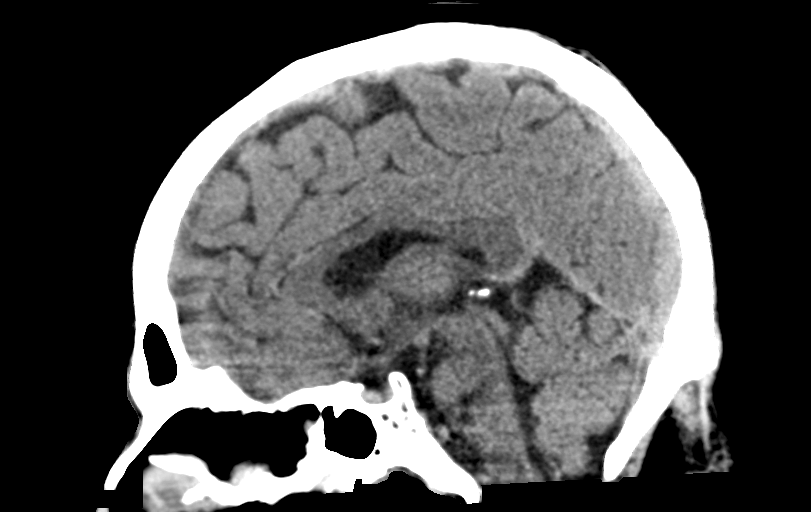
[im 33/50  brain]
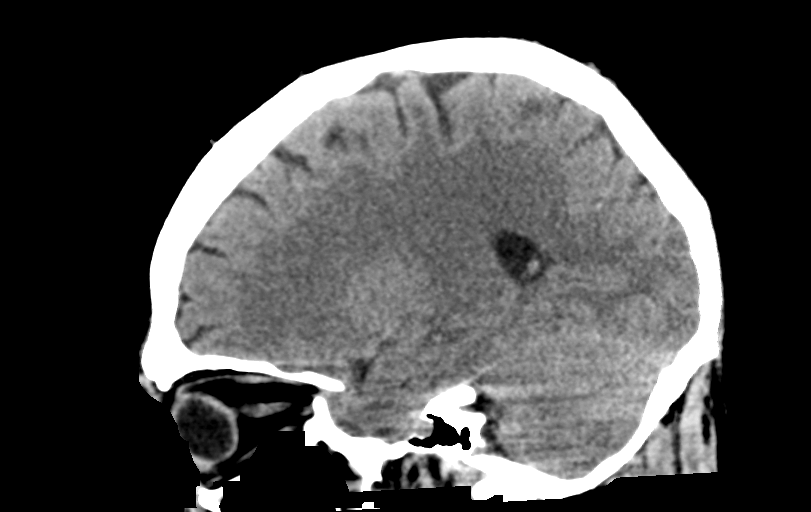

[14 of 47 positions shown; findings below may reference images not displayed]

FINDINGS: Brain: No evidence of acute infarction, hemorrhage, hydrocephalus,
extra-axial collection or mass lesion/mass effect.

Vascular: Negative for hypervascular vessel.

Skull: Negative

Sinuses/Orbits: Mucosal edema in the paranasal sinuses bilaterally.
Negative orbit.

Other: None

ASPECTS (Alberta Stroke Program Early CT Score)

- Ganglionic level infarction (caudate, lentiform nuclei, internal
capsule, insula, M1-M3 cortex): 7

- Supraganglionic infarction (M4-M6 cortex): 3

Total score (0-10 with 10 being normal): 10
IMPRESSION: 1. Negative CT head.  No acute abnormality.
2. ASPECTS is 10

These results were called by telephone at the time of interpretation
on 02/19/2017 at [DATE] to Dr. REGGIE LEBER , who verbally
acknowledged these results.

## 2018-10-25 ENCOUNTER — Other Ambulatory Visit: Payer: Self-pay | Admitting: Family Medicine

## 2018-10-28 ENCOUNTER — Other Ambulatory Visit: Payer: Self-pay | Admitting: Orthopedic Surgery

## 2018-10-28 DIAGNOSIS — G8929 Other chronic pain: Secondary | ICD-10-CM

## 2018-10-28 DIAGNOSIS — M25561 Pain in right knee: Principal | ICD-10-CM

## 2018-11-12 ENCOUNTER — Ambulatory Visit
Admission: RE | Admit: 2018-11-12 | Discharge: 2018-11-12 | Disposition: A | Discharge status: Home / Self Care | Payer: No Typology Code available for payment source | Source: Ambulatory Visit | Attending: Orthopedic Surgery | Admitting: Orthopedic Surgery

## 2018-11-12 DIAGNOSIS — M25561 Pain in right knee: Secondary | ICD-10-CM | POA: Diagnosis not present

## 2018-11-12 DIAGNOSIS — R936 Abnormal findings on diagnostic imaging of limbs: Secondary | ICD-10-CM | POA: Diagnosis not present

## 2018-11-12 DIAGNOSIS — G8929 Other chronic pain: Secondary | ICD-10-CM | POA: Insufficient documentation

## 2018-11-29 ENCOUNTER — Encounter: Payer: Self-pay | Admitting: Family Medicine

## 2018-11-29 DIAGNOSIS — G479 Sleep disorder, unspecified: Secondary | ICD-10-CM

## 2018-11-30 ENCOUNTER — Telehealth: Payer: Self-pay

## 2018-11-30 DIAGNOSIS — I739 Peripheral vascular disease, unspecified: Secondary | ICD-10-CM

## 2018-11-30 NOTE — Telephone Encounter (Signed)
Copied from [facility] [id number]. Topic: Referral - Request for Referral >> Nov 30, 2018  9:51 AM Virl Axe D wrote: Has patient seen PCP for this complaint? Yes *If NO, is insurance requiring patient see PCP for this issue before PCP can refer them? Referral for which specialty: Cardiologist Preferred provider/office:  Pulmonology Dr. Merton Border Reason for referral: Fatigue in calves and legs / Pt has appt scheduled for 12/01/18

## 2018-11-30 NOTE — Telephone Encounter (Signed)
Sent to PCP for referrals to be placed

## 2018-11-30 NOTE — Telephone Encounter (Signed)
I am happy to place a referral to cardiology though Dr Alva Garnet is a pulmonologist and does not treat the type of symptoms the patient was previously having. One of the cardiologists in the same office might be able to help him with that issue.

## 2018-11-30 NOTE — Addendum Note (Signed)
Addended by: [name] on: 11/30/2018 07:41 PM   Modules accepted: Orders

## 2018-12-01 NOTE — Telephone Encounter (Signed)
Called and spoke with patient. Pt advised. He would like referrals placed for both cardiologist and the sleep doctor.   Sent to PCP

## 2018-12-01 NOTE — Telephone Encounter (Signed)
Noted. We would need to have him see a sleep specialist if he has been dealing with narcolepsy.

## 2018-12-01 NOTE — Telephone Encounter (Signed)
Called and spoke with patient. Pt stated that he is fine with seeing someone else if Dr.Simonds would be the best one to help him with his current symptoms. Pt stated that he was Dx with narcolepsy a while back ago and he really feels that his current symptoms are do to this. Pt is c/o being very fatigue, loss a feeling in his leg when angery or excited.   Sent to PCP

## 2018-12-02 ENCOUNTER — Encounter: Payer: Self-pay | Admitting: Physician Assistant

## 2018-12-02 ENCOUNTER — Ambulatory Visit (INDEPENDENT_AMBULATORY_CARE_PROVIDER_SITE_OTHER): Payer: Self-pay | Admitting: Physician Assistant

## 2018-12-02 VITALS — BP 140/90 | HR 85 | Temp 98.2°F | Wt 211.0 lb

## 2018-12-02 DIAGNOSIS — K145 Plicated tongue: Secondary | ICD-10-CM

## 2018-12-02 DIAGNOSIS — K140 Glossitis: Secondary | ICD-10-CM

## 2018-12-02 MED ORDER — MAGIC MOUTHWASH W/LIDOCAINE: 5.0000 mL | Freq: Four times a day (QID) | ORAL | 0 refills | Status: DC

## 2018-12-02 NOTE — Patient Instructions (Signed)
Thank you for choosing [facility] for your health care needs.  You have been diagnosed with tongue fissues / glossitis (inflammation of the tongue).  Recommend you gargle with salt water. You have been prescribed an oral rinse that will help with symptoms.  Avoid acidic foods; tomato sauce, fruit juices, hot liquids, or foods with rough/sharp edges (such as tortilla chips).  Follow-up with family physician, [facility], or ENT in one week if symptoms not improving. Follow-up sooner if symptoms worsen.  Hope you feel better soon!  Glossitis Glossitis is inflammation of the tongue. This may be a stand-alone condition or it may be a symptom of a different condition that you have. Generally, glossitis goes away when its cause is identified and treated. Glossitis can be dangerous if it causes difficulty breathing. What are the causes? This condition can be caused by many different things. In some cases, the cause may not be known. Certain underlying conditions may cause glossitis, such as:  Viral, bacterial, or yeast infections.  Allergies.  Dysfunction of the skin and mucous membranes. This can happen with certain autoimmune disorders.  Abnormal tissue growths (tumors).  Lack of healthy red blood cells (anemia).  Movement of stomach acid into the tube that connects the mouth and the stomach (gastroesophageal reflux).  Lack of proper nutrition or certain vitamins.  Certain lifelong (chronic) medical conditions, such as diabetes.  Sometimes, glossitis may not be caused by an underlying condition. In these cases, glossitis may be caused by:  Use of tobacco products, such as cigarettes, chewing tobacco, or e-cigarettes.  Excessive alcohol use.  Tongue injury or irritation.  Certain medicines, such as medicines to treat cancer.  What increases the risk? This condition is more likely to develop in:  People who are 50 years or older.  Men.  People who are taking antibiotics or  steroids, such as asthma medicines.  People who drink alcohol excessively.  People who use tobacco products, including cigarettes, chewing tobacco, or e-cigarettes.  People with chronic medical conditions, such as immune diseases or cancer.  People who do not brush or floss their teeth regularly.  People who lack proper nutrition or are anemic.  What are the signs or symptoms? Symptoms of this condition vary depending on the cause. Symptoms may include:  Swelling of the tongue.  Pain and tenderness in the tongue. Sometimes, this condition is painless.  Changes in tongue color. The tongue may be pale or bright red.  Smooth areas on the tongue's surface.  A small mass of tissue (node) or white patch on the tongue.  Difficulty chewing, swallowing, or talking.  Difficulty breathing.  How is this diagnosed? This condition is diagnosed based on a physical exam and medical history. Your health care provider may ask you about your eating and drinking habits. You may have tests, including:  Blood tests.  Removal of a small amount of cells from the tongue that are examined under a microscope (biopsy).  You may be given the name of a dentist or a health care provider who specializes in ear, nose, and throat (ENT) problems (otolaryngologist). How is this treated? Treatment for this condition depends on the underlying cause and may include:  Following instructions from your health care provider about keeping your mouth clean and avoiding irritants that may have caused your condition or made it worse.  Nutritional therapy. Your health care provider may tell you to change your eating and drinking habits or take a nutritional supplement.  Managing underlying conditions that may have  caused your glossitis.  Medicines, such as: ? Corticosteroids to reduce inflammation. ? Antibiotics if your condition was caused by an infection. ? Local anestheticsthat numb your tongue or mouth (local  anesthetics).  Follow these instructions at home:  Keep your teeth and mouth clean. This includes brushing and flossing frequently and having regular dental checkups.  If you wear dentures or dental braces, work with your dentist to make sure they fit correctly.  Eat healthy foods. Follow instructions from your health care provider about eating or drinking restrictions.  Avoid tobacco products, including cigarettes, chewing tobacco, or e-cigarettes. If you need help quitting, ask your health care provider.  Avoid excessive alcohol use.  Avoid any irritants that may have caused your condition or made it worse, such as chemicals or certain foods.  Keep all follow-up visits as told by your health care provider. This is important. Contact a health care provider if:  You have a fever.  You develop new symptoms.  You have symptoms that do not get better with medicine or get worse.  You have symptoms that do not go away after 10 days.  You cannot eat or drink because of your pain. Get help right away if:  You have severe pain or swelling.  You have difficulty breathing, swallowing, or talking. This information is not intended to replace advice given to you by your health care provider. Make sure you discuss any questions you have with your health care provider. Document Released: 11/28/2002 Document Revised: 05/15/2016 Document Reviewed: 04/25/2015 Elsevier Interactive Patient Education  2018 [employer].

## 2018-12-02 NOTE — Telephone Encounter (Signed)
Referrals placed 

## 2018-12-02 NOTE — Progress Notes (Signed)
Patient ID: Daniel Calderon DOB: 06/05/1966 AGE: 52 y.o. MRN: [id number]   PCP: [name], MD   Chief Complaint:  Chief Complaint  Patient presents with  . focus-burning/sores on tongue x a.m     Subjective:    HPI:  Daniel Calderon is a 52 y.o. male presents for evaluation  Chief Complaint  Patient presents with  . focus-burning/sores on tongue x a.m    52 year old male presents to [facility] with one week history of tongue burning sensation. Entire tongue; worse along lateral edges. Began after drinking new fruit juice, [employer] [facility] brand) of raspberry/cranberry fruit juice. Patient has drank [employer] juice before, but never this flavor. States is not diet formulation. Caused burning irritation. Has been constant; however, aggravated by continuing to drink fruit juice. This morning noticed bumps on tongue. Mild symptom relief with drinking cold water. Denies fever, chills, headache, URI symptoms, sore throat, pain with swallowing, difficulty swallowing, burning sensation/bumps on inferior aspect of tongue or buccal mucosa. Patient reports previous history of tongue irritation with vinegar and balsalmic vinegar. However, no previous history of bumps. Patient does drink alcohol; beer. Does not irritate tongue. No history of smoking or chewing tobacco.  A review of symptoms was performed, pertinent positives and negatives as mentioned in HPI.  The following portions of the patient's history were reviewed and updated as appropriate: allergies, current medications and past medical history.  Patient Active Problem List   Diagnosis Date Noted  . Acute pain of left knee 04/18/2018  . Cutaneous skin tags 04/18/2018  . Lower extremity edema 04/18/2018  . Thoracic outlet syndrome 02/19/2018  . Pain in limb 02/19/2018  . Rectal bleeding   . Sinus congestion 09/01/2017  . Muscle strain 09/01/2017  . Chest pain 03/21/2017  . Dyspnea on exertion  02/19/2017  . Language difficulty 02/19/2017  . BPH with obstruction/lower urinary tract symptoms 12/03/2015  . Medication refill 09/13/2015  . Erectile disorder, generalized, mild 07/26/2015  . Hypogonadism in male 05/24/2015  . Environmental allergies 04/13/2015  . Sleep apnea 04/13/2015  . Migraines 04/13/2015  . Neck pain 04/13/2015  . Adjustment disorder with mixed anxiety and depressed mood 04/13/2015  . Psoriasis 04/13/2015  . History of kidney stones 04/13/2015  . Neuropathy 04/13/2015  . Benign fibroma of prostate 03/24/2015  . Hypertension 03/24/2015  . Elevation of level of transaminase or lactic acid dehydrogenase (LDH) 03/24/2015  . Fatigue 03/24/2015    Allergies  Allergen Reactions  . Erythromycin Nausea And Vomiting  . Cephalexin Other (See Comments)    Unknown  . Dexamethasone Other (See Comments)    Unknown  . Oxycodone-Acetaminophen Other (See Comments)    Unknown  . Prednisone Other (See Comments)    Unknown  . Gabapentin Other (See Comments)    Aggressive  . Ibuprofen Itching and Other (See Comments)    Can take in low doses per pt    Current Outpatient Medications on File Prior to Visit  Medication Sig Dispense Refill  . amLODipine (NORVASC) 10 MG tablet TAKE 1 TABLET BY MOUTH DAILY. 90 tablet 2  . calcipotriene (DOVONOX) 0.005 % cream Apply 1 application topically daily as needed (for psoriasis).    . fluticasone (FLONASE) 50 MCG/ACT nasal spray Place 2 sprays into both nostrils daily. (Patient taking differently: Place 2 sprays into both nostrils daily as needed for allergies. ) 16 g 2  . Multiple Vitamin (MULTIVITAMIN) tablet Take 1 tablet by mouth daily.    Marland Kitchen  omeprazole (PRILOSEC) 20 MG capsule TAKE 1 CAPSULE BY MOUTH DAILY 90 capsule 3  . sertraline (ZOLOFT) 100 MG tablet TAKE 1 TABLET BY MOUTH ONCE DAILY 30 tablet 2  . testosterone (ANDROGEL) 50 MG/5GM (1%) GEL Place 5 g onto the skin daily. 30 Tube 2   No current facility-administered  medications on file prior to visit.        Objective:   Vitals:   12/02/18 1614  BP: 140/90  Pulse: 85  Temp: 98.2 F (36.8 C)  SpO2: 97%     Wt Readings from Last 3 Encounters:  12/02/18 211 lb (95.7 kg)  09/15/18 210 lb (95.3 kg)  07/26/18 210 lb (95.3 kg)    Physical Exam:   General Appearance:  Patient sitting comfortably on examination table. In no acute distress. Afebrile.  Head:  Normocephalic, without obvious abnormality, atraumatic. No facial swelling.  Eyes:  PERRL, conjunctiva/corneas clear, EOM's intact  Ears:  Normal TM's and external ear canals, both ears  Nose: Nares normal, septum midline. No discharge. Normal mucosa. No sinus tenderness with percussion/palpation.  Throat: Lips WNL. No lesions. No aphthous ulcers. No swelling/edema. Buccal mucosa WNL. Dentition and gums healthy. Throat with no erythema. No exudate. No cobblestoning. Uvula longer than average; patient states per his normal. No grimacing with swallowing. No drooling. No stridor. Tongue reveals diffuse fissuring appearance. No evidence of bleeding. No visible bumps. Tenderness with palpation along fissuring. Tongue with no discoloration. No leukoplakia on tongue or buccal mucosa. No indication of swelling of tongue.  Neck: Supple, symmetrical, trachea midline, no adenopathy  Lungs:   Clear to auscultation bilaterally, respirations unlabored  Heart:  Regular rate and rhythm  Extremities: Extremities normal, atraumatic, no cyanosis or edema  Pulses: 2+ and symmetric  Skin: Skin color, texture, turgor normal, no rashes or lesions  Lymph nodes: Cervical, supraclavicular, and axillary nodes normal  Neurologic: Normal    Assessment & Plan:    Exam findings, diagnosis etiology and medication use and indications reviewed with patient. Follow-Up and discharge instructions provided. No emergent/urgent issues found on exam.  Patient education was provided.   Patient verbalized understanding of  information provided and agrees with plan of care (POC), all questions answered. The patient is advised to call or return to clinic if condition does not see an improvement in symptoms, or to seek the care of the closest emergency department if condition worsens with the below plan.    1. Glossitis  2. Tongue fissure  - magic mouthwash w/lidocaine SOLN; Take 5 mLs by mouth 4 (four) times daily.  Dispense: 100 mL; Refill: 0  Patient with one week history of tongue burning sensation and new onset bumps (only fissuring visible to provider). Patient believes secondary to new fruit juice drink.  Patient with recent blood work performed by ED on 05/25/2018; no indication of anemia. Blood work from 12/30/2017 revealed slightly elevated B12 (939, patient on B12 supplements), normal iron, slightly elevated TIBC, and low normal ferritin.  Patient's Hgb A1C from 2018; 5.3%.  Patient with no recent antibiotic usage.  At this time, will treat symptomatically with Magic Mouthwash. Advised no further use of raspberry/cranberry juice. Discussed avoiding acidic foods, spicy foods, rough/sharp edges foods, and hot liquids/foods. Patient may use OTC Tylenol for pain. Advised patient follow-up with Leodis Binet, PCP, urgent care, or ENT if not improving in one week, sooner with worsening symptoms.    Darlin Priestly, MHS, PA-C Montey Hora, MHS, PA-C Advanced Practice Provider [facility]  [address] [facility], [place], [place] [address] (p):  [phone] [email or web address]

## 2018-12-05 NOTE — Progress Notes (Signed)
Cardiology Office Note Date:  12/06/2018  Patient ID:  Daniel Calderon, Daniel Calderon Aug 08, 1966, MRN [id number] PCP:  [name], MD  Cardiologist:  Dr. Saunders Revel, MD    Chief Complaint: Leg pain  History of Present Illness: Daniel Calderon is a 52 y.o. male with history of thoracic outlet syndrome s/p remote surgical intervention, renal stones, migraine disorder, anxiety, depression, sleep apnea, HTN, and HLD who presents for evaluation of leg pain.   He was previously admitted in 02/2017 for TIA-like symptoms with extensive work up being unrevealing.  Patient was seen by Dr. Saunders Revel in 02/2017 for evaluation of DOE at the request of his PCP. At that time he reported a history of chest pain and SOB for many years. Prior cardiac work up showed a patient reported stress test in 2011 that was reported as normal. Echo in 02/2017 showed an EF of 55-60%, normal wall motion, normal diastolic function, no significant valvular abnormalities, normal RV size and function. He underwent ETT that was normal and his symptoms were not felt to be cardiac in etiology.   Patient has recently been evaluated by [facility] in 02/2018 for evaluation of leg pain and weakness with ambulation of short to medium distances. Lower extremity arterial ultrasound and ABIs on 02/19/2018 showed brisk triphasic waveforms with normal bilateral ABIs (right 1.33, left 1.36), and normal digital pressures bilaterally consistent with no arterial insufficiency. Vascular surgery recommended possible MRI of the back or other nerve conduction studies be performed for possible neurogenic claudication. MRI of the lumbar spineshowed left foraminal disc protrusion at L3-4 contacting and possibly irritating the L3 nerve root.   Patient was referred to cardiology today by Dr. Caryl Bis for repeat evaluation of leg weakness.   He comes in accompanied by his daughter today.  He has continued to note significant bilateral lower extremity  aching and weakness that is associated with exertion.  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  He initially felt like this may be related to some bilateral knee issues and subsequently had a meniscal repair of the left knee which did help his knee pain though did not help the left lower extremity cramping and weakness.  He also had a cortisone injection in the right knee which has helped his knee pain though did not help the lower extremity weakness and aching.  He also reports exertional shortness of breath without chest pain.  Indicates throughout all his life he has been able to fall asleep easily.  He is compliant with his CPAP and recently underwent titration and upgrading CPAP machines approximately 2 years prior.  He indicates he has previously been diagnosed with narcolepsy.   Past Medical History:  Diagnosis Date  . Allergy    Seasonal  . Anxiety   . Benign enlargement of prostate   . Depression   . Elevated BP   . Elevated transaminase level   . Failure of erection   . Fatigue   . Frequent headaches   . GERD (gastroesophageal reflux disease)   . Hypogonadism in male   . Kidney stones    History of kidney stones  . Migraine   . Sleep apnea    Currently uses cpap  . Thoracic outlet syndrome    Left Arm  Past Surgical History:  Procedure Laterality Date  . COLONOSCOPY WITH PROPOFOL N/A 11/11/2017   Procedure: COLONOSCOPY WITH PROPOFOL;  Surgeon: Lin Landsman, MD;  Location: [facility];  Service: Endoscopy;  Laterality: N/A;  . KNEE ARTHROSCOPY WITH MEDIAL MENISECTOMY Left 07/26/2018   Procedure: KNEE ARTHROSCOPY WITH PARTIAL MEDIAL MENISECTOMY;  Surgeon: Leim Fabry, MD;  Location: [facility];  Service: Orthopedics;  Laterality: Left;  . Left Shoulder  Surgery  2011  . nerve block     2 in neck. 5 in back.  . NOSE SURGERY    . POLYPECTOMY  11/11/2017   Procedure: POLYPECTOMY;  Surgeon: Lin Landsman, MD;  Location: [facility];  Service: Endoscopy;;  . SCALENE NODE BIOPSY / EXCISION    . VASECTOMY      Current Meds  Medication Sig  . amLODipine (NORVASC) 10 MG tablet TAKE 1 TABLET BY MOUTH DAILY.  . calcipotriene (DOVONOX) 0.005 % cream Apply 1 application topically daily as needed (for psoriasis).  . fluticasone (FLONASE) 50 MCG/ACT nasal spray Place 2 sprays into both nostrils daily. (Patient taking differently: Place 2 sprays into both nostrils daily as needed for allergies. )  . magic mouthwash w/lidocaine SOLN Take 5 mLs by mouth 4 (four) times daily.  . Multiple Vitamin (MULTIVITAMIN) tablet Take 1 tablet by mouth daily.  Marland Kitchen omeprazole (PRILOSEC) 20 MG capsule TAKE 1 CAPSULE BY MOUTH DAILY  . sertraline (ZOLOFT) 100 MG tablet TAKE 1 TABLET BY MOUTH ONCE DAILY  . testosterone (ANDROGEL) 50 MG/5GM (1%) GEL Place 5 g onto the skin daily.    Allergies:   Erythromycin; Cephalexin; Dexamethasone; Oxycodone-acetaminophen; Prednisone; Gabapentin; and Ibuprofen   Social History:  The patient  reports that he has never smoked. He has never used smokeless tobacco. He reports current alcohol use of about 3.0 standard drinks of alcohol per week. He reports that he does not use drugs.   Family History:  The patient's family history includes Diabetes in his brother; Healthy in his daughter, father, and son; Hearing loss in his maternal grandmother and mother; Heart attack in his paternal uncle; Heart disease in an other family member; Hypertension in his mother; Stroke in his paternal uncle.  ROS:   Review of Systems  Constitutional: Positive for malaise/fatigue. Negative for chills, diaphoresis, fever and weight loss.  HENT: Negative for congestion.   Eyes: Negative for discharge and redness.  Respiratory: Negative for cough,  hemoptysis, sputum production, shortness of breath and wheezing.   Cardiovascular: Positive for claudication. Negative for chest pain, palpitations, orthopnea, leg swelling and PND.  Gastrointestinal: Negative for abdominal pain, blood in stool, heartburn, melena, nausea and vomiting.  Genitourinary: Negative for hematuria.  Musculoskeletal: Negative for falls and myalgias.  Skin: Negative for rash.  Neurological: Positive for weakness and headaches. Negative for dizziness, tingling, tremors, sensory change, speech change, focal weakness and loss of consciousness.  Endo/Heme/Allergies: Does not bruise/bleed easily.  Psychiatric/Behavioral: Negative for substance abuse. The patient is not nervous/anxious.   All other systems reviewed and are negative.    PHYSICAL EXAM:  VS:  BP 130/90 (BP Location: Left Arm, Patient Position: Sitting, Cuff Size: Normal)   Pulse 62   Ht 6\' 2"  (1.88 m)   Wt 212 lb 12 oz (96.5 kg)   BMI 27.32 kg/m  BMI: Body mass index is 27.32 kg/m.  Physical Exam  Constitutional: He is oriented to person, place, and time. He appears well-developed and well-nourished.  HENT:  Head: Normocephalic and atraumatic.  Eyes:  Right eye exhibits no discharge. Left eye exhibits no discharge.  Neck: Normal range of motion. No JVD present.  Cardiovascular: Normal rate, regular rhythm, S1 normal, S2 normal and normal heart sounds. Exam reveals no distant heart sounds, no friction rub, no midsystolic click and no opening snap.  No murmur heard. Pulses:      Dorsalis pedis pulses are 2+ on the right side and 2+ on the left side.       Posterior tibial pulses are 2+ on the right side and 2+ on the left side.  Pulmonary/Chest: Effort normal and breath sounds normal. No respiratory distress. He has no decreased breath sounds. He has no wheezes. He has no rales. He exhibits no tenderness.  Abdominal: Soft. He exhibits no distension. There is no abdominal tenderness.  Musculoskeletal:          General: No edema.  Neurological: He is alert and oriented to person, place, and time.  Skin: Skin is warm and dry. No cyanosis. Nails show no clubbing.  Psychiatric: He has a normal mood and affect. His speech is normal and behavior is normal. Judgment and thought content normal.     EKG:  Was ordered and interpreted by me today. Shows NSR, 62 bpm, no acute ST-T changes  Recent Labs: 05/25/2018: ALT 132; BUN 18; Creatinine, Ser 1.11; Hemoglobin 15.2; Platelets 159; Potassium 3.8; Sodium 138  No results found for requested labs within last 8760 hours.   CrCl cannot be calculated (Patient's most recent lab result is older than the maximum 21 days allowed.).   Wt Readings from Last 3 Encounters:  12/06/18 212 lb 12 oz (96.5 kg)  12/02/18 211 lb (95.7 kg)  09/15/18 210 lb (95.3 kg)     Other studies reviewed: Additional studies/records reviewed today include: summarized above  ASSESSMENT AND PLAN:  1. Lower extremity pain consistent with claudication: He continues to have exertional leg pain.  Resting ABIs were unrevealing as above.  We will proceed with exercise ABIs at this time.  If these are abnormal I recommend follow-up with Dr. Saunders Revel or Fletcher Anon for [facility] consult.  He does not think he ever saw the neurosurgeon with regards to his abnormal lumbar MRI.  I have recommended he talk with his PCP about getting this scheduled.  If his exercise ABIs are normal he will need to be further evaluated for non-vascular etiology of his symptoms by his PCP.  2. Exertional dyspnea: Schedule treadmill Myoview to evaluate for high risk ischemia.  At follow-up appointment with [name] or PCP recommend fasting lipid panel for further risk stratification.  3. Sleep disordered breathing/fatigue/somnolence: Reports compliance with CPAP.  This is managed by PCP.  Further work-up per PCP.  Patient indicates he has been referred to a sleep specialist.  4. Hypertension: Blood pressure is reasonably controlled.   Remains on amlodipine 10 mg daily.  Followed by PCP.  Disposition: F/u with Dr. Saunders Revel or an APP in 2 to 3 weeks after his exercise ABIs are performed.  Current medicines are reviewed at length with the patient today.  The patient did not have any concerns regarding medicines.  [name] Banker PA-C 12/06/2018 10:16 AM     [address] [place], [place] [address] [phone]

## 2018-12-06 ENCOUNTER — Telehealth: Payer: Self-pay | Admitting: Emergency Medicine

## 2018-12-06 ENCOUNTER — Ambulatory Visit: Payer: No Typology Code available for payment source | Admitting: Physician Assistant

## 2018-12-06 ENCOUNTER — Encounter: Payer: Self-pay | Admitting: Physician Assistant

## 2018-12-06 VITALS — BP 130/90 | HR 62 | Ht 74.0 in | Wt 212.8 lb

## 2018-12-06 DIAGNOSIS — I739 Peripheral vascular disease, unspecified: Secondary | ICD-10-CM | POA: Diagnosis not present

## 2018-12-06 DIAGNOSIS — R0609 Other forms of dyspnea: Secondary | ICD-10-CM

## 2018-12-06 DIAGNOSIS — G4730 Sleep apnea, unspecified: Secondary | ICD-10-CM | POA: Diagnosis not present

## 2018-12-06 DIAGNOSIS — I10 Essential (primary) hypertension: Secondary | ICD-10-CM | POA: Diagnosis not present

## 2018-12-06 DIAGNOSIS — R5383 Other fatigue: Secondary | ICD-10-CM

## 2018-12-06 NOTE — Telephone Encounter (Signed)
Spoke with patient informed him of recommendations. Patient acknowledge understanding

## 2018-12-06 NOTE — Patient Instructions (Signed)
Medication Instructions:  Your physician recommends that you continue on your current medications as directed. Please refer to the Current Medication list given to you today.  If you need a refill on your cardiac medications before your next appointment, please call your pharmacy.   Lab work: None ordered   If you have labs (blood work) drawn today and your tests are completely normal, you will receive your results only by: Marland Kitchen MyChart Message (if you have MyChart) OR . A paper copy in the mail If you have any lab test that is abnormal or we need to change your treatment, we will call you to review the results.  Testing/Procedures: 1- Exercise ABIs  Your physician has requested that you have an ankle brachial index (ABI). During this test an ultrasound and blood pressure cuff are used to evaluate the arteries that supply the arms and legs with blood. Allow thirty minutes for this exam. There are no restrictions or special instructions.  2- Treadmill Myoview  [facility] MYOVIEW  Your caregiver has ordered a Stress Test with nuclear imaging. The purpose of this test is to evaluate the blood supply to your heart muscle. This procedure is referred to as a "Non-Invasive Stress Test." This is because other than having an IV started in your vein, nothing is inserted or "invades" your body. Cardiac stress tests are done to find areas of poor blood flow to the heart by determining the extent of coronary artery disease (CAD). Some patients exercise on a treadmill, which naturally increases the blood flow to your heart, while others who are  unable to walk on a treadmill due to physical limitations have a pharmacologic/chemical stress agent called Lexiscan . This medicine will mimic walking on a treadmill by temporarily increasing your coronary blood flow.   Please note: these test may take anywhere between 2-4 hours to complete  PLEASE REPORT TO [place] AT THE FIRST DESK  WILL DIRECT YOU WHERE TO GO  Date of Procedure:_____________________________________  Arrival Time for Procedure:______________________________  PLEASE NOTIFY THE OFFICE AT LEAST 24 HOURS IN ADVANCE IF YOU ARE UNABLE TO KEEP YOUR APPOINTMENT.  [phone] AND  PLEASE NOTIFY NUCLEAR MEDICINE AT [facility] AT LEAST 24 HOURS IN ADVANCE IF YOU ARE UNABLE TO KEEP YOUR APPOINTMENT. [phone]  How to prepare for your Myoview test:  1. Do not eat or drink after midnight 2. No caffeine for 24 hours prior to test 3. No smoking 24 hours prior to test. 4. Your medication may be taken with water.  If your doctor stopped a medication because of this test, do not take that medication. 5. Skirts or pants are appropriate. Please wear a short sleeve shirt. 6. No perfume, cologne or lotion. 7. Wear comfortable walking shoes.      Follow-Up: At [facility], you and your health needs are our priority.  As part of our continuing mission to provide you with exceptional heart care, we have created designated Provider Care Teams.  These Care Teams include your primary Cardiologist (physician) and Advanced Practice Providers (APPs -  Physician Assistants and Nurse Practitioners) who all work together to provide you with the care you need, when you need it. You will need a follow up appointment in 1 weeks- 2 weeks following testing. You may see Christell Faith, PA.

## 2018-12-06 NOTE — Telephone Encounter (Signed)
Spoke to patient whom stated that his issue(tongue) is no better still swelling and pain.  Is there anything else [facility] recommends for this issue.

## 2018-12-06 NOTE — Telephone Encounter (Signed)
Please tell pt I recommend he follow up with an ear nose and throat specialist for further evaluation and management. He may need to see his primary care provider for a referral depending on insurance situation. Let me know if he has any questions or concerns. Thanks!

## 2018-12-13 ENCOUNTER — Encounter
Admission: RE | Admit: 2018-12-13 | Discharge: 2018-12-13 | Disposition: A | Discharge status: Home / Self Care | Payer: No Typology Code available for payment source | Source: Ambulatory Visit | Attending: Physician Assistant | Admitting: Physician Assistant

## 2018-12-13 DIAGNOSIS — R0609 Other forms of dyspnea: Secondary | ICD-10-CM | POA: Diagnosis not present

## 2018-12-13 LAB — NM MYOCAR MULTI W/SPECT W/WALL MOTION / EF
CHL CUP RESTING HR STRESS: 61 {beats}/min
CSEPED: 9 min
CSEPEDS: 1 s
CSEPEW: 10.1 METS
CSEPHR: 85 %
CSEPPHR: 146 {beats}/min
LV dias vol: 92 mL (ref 62–150)
LV sys vol: 39 mL
NUC STRESS TID: 0.94
SDS: 0
SRS: 0
SSS: 0

## 2018-12-13 MED ORDER — TECHNETIUM TC 99M TETROFOSMIN IV KIT
30.0000 | PACK | Freq: Once | INTRAVENOUS | Status: AC | PRN
  Administered 2018-12-13: 30.764 via INTRAVENOUS

## 2018-12-13 MED ORDER — TECHNETIUM TC 99M TETROFOSMIN IV KIT
10.0000 | PACK | Freq: Once | INTRAVENOUS | Status: AC | PRN
  Administered 2018-12-13: 10.71 via INTRAVENOUS

## 2018-12-17 ENCOUNTER — Ambulatory Visit (INDEPENDENT_AMBULATORY_CARE_PROVIDER_SITE_OTHER): Payer: Self-pay | Admitting: Physician Assistant

## 2018-12-17 VITALS — BP 145/90 | HR 88 | Temp 97.8°F | Resp 18 | Wt 213.0 lb

## 2018-12-17 DIAGNOSIS — R0981 Nasal congestion: Secondary | ICD-10-CM

## 2018-12-17 DIAGNOSIS — H6983 Other specified disorders of Eustachian tube, bilateral: Secondary | ICD-10-CM

## 2018-12-17 MED ORDER — LORATADINE 10 MG PO TABS: 10.0000 mg | ORAL_TABLET | Freq: Every day | ORAL | 0 refills | Status: DC

## 2018-12-17 MED ORDER — PSEUDOEPHEDRINE HCL ER 120 MG PO TB12: 120.0000 mg | ORAL_TABLET | Freq: Two times a day (BID) | ORAL | 0 refills | Status: AC

## 2018-12-17 NOTE — Progress Notes (Signed)
Patient ID: Daniel Calderon DOB: Jul 21, 1966 AGE: 51 y.o. MRN: [id number]   PCP: Daniel Haven, MD   Chief Complaint:  Chief Complaint  Patient presents with  . Ear Pain    X 2 DAYS   . Nasal Congestion    X 2 DAYS      Subjective:    HPI:  Daniel Calderon is a 52 y.o. male presents for evaluation  Chief Complaint  Patient presents with  . Ear Pain    X 2 DAYS   . Nasal Congestion    X 2 DAYS     52 year old male presents to [facility] with three day history of URI symptoms; began on Wednesday 12/15/2018, Christmas day. Began with bilateral ear pain. One side not worse than the other. Describes as ache. Associated rhinorrhea and nasal congestion. Associated bilateral frontal and maxillary sinus pressure/congestion. Uses Flonase regularly/daily. Has not taken any OTC medication for symptom relief. States no family members or coworkers sick with similar symptoms. Denies fever, chills, dizziness/lightheadedness, ear discharge/drainage, tinnitus, dental pain, sore throat, cough, chest pain, SOB, wheezing. Patient with seasonal allergies; used to be on daily antihistamine and Singulair. Does not take any longer. No smoking history. No asthma history.  Patient seen at [facility] on 12/02/2018 with glossitis. Prescribed Magic Mouthwash. Advised to follow-up with ENT if not improving. Patient states has significantly improved, but has not resolved. Describes as healing burn on tongue sensation.  A limited review of symptoms was performed, pertinent positives and negatives as mentioned in HPI.  The following portions of the patient's history were reviewed and updated as appropriate: allergies, current medications and past medical history.  Patient Active Problem List   Diagnosis Date Noted  . Acute pain of left knee 04/18/2018  . Cutaneous skin tags 04/18/2018  . Lower extremity edema 04/18/2018  . Thoracic outlet syndrome 02/19/2018  . Pain in limb  02/19/2018  . Rectal bleeding   . Sinus congestion 09/01/2017  . Muscle strain 09/01/2017  . Chest pain 03/21/2017  . Dyspnea on exertion 02/19/2017  . Language difficulty 02/19/2017  . BPH with obstruction/lower urinary tract symptoms 12/03/2015  . Medication refill 09/13/2015  . Erectile disorder, generalized, mild 07/26/2015  . Hypogonadism in male 05/24/2015  . Environmental allergies 04/13/2015  . Sleep apnea 04/13/2015  . Migraines 04/13/2015  . Neck pain 04/13/2015  . Adjustment disorder with mixed anxiety and depressed mood 04/13/2015  . Psoriasis 04/13/2015  . History of kidney stones 04/13/2015  . Neuropathy 04/13/2015  . Benign fibroma of prostate 03/24/2015  . Hypertension 03/24/2015  . Elevation of level of transaminase or lactic acid dehydrogenase (LDH) 03/24/2015  . Fatigue 03/24/2015    Allergies  Allergen Reactions  . Erythromycin Nausea And Vomiting  . Cephalexin Other (See Comments)    Unknown  . Dexamethasone Other (See Comments)    Unknown  . Oxycodone-Acetaminophen Other (See Comments)    Unknown  . Prednisone Other (See Comments)    Unknown  . Gabapentin Other (See Comments)    Aggressive  . Ibuprofen Itching and Other (See Comments)    Can take in low doses per pt    Current Outpatient Medications on File Prior to Visit  Medication Sig Dispense Refill  . amLODipine (NORVASC) 10 MG tablet TAKE 1 TABLET BY MOUTH DAILY. 90 tablet 2  . calcipotriene (DOVONOX) 0.005 % cream Apply 1 application topically daily as needed (for psoriasis).    . Multiple Vitamin (MULTIVITAMIN) tablet Take  1 tablet by mouth daily.    Marland Kitchen omeprazole (PRILOSEC) 20 MG capsule TAKE 1 CAPSULE BY MOUTH DAILY 90 capsule 3  . sertraline (ZOLOFT) 100 MG tablet TAKE 1 TABLET BY MOUTH ONCE DAILY 30 tablet 2  . testosterone (ANDROGEL) 50 MG/5GM (1%) GEL Place 5 g onto the skin daily. 30 Tube 2   No current facility-administered medications on file prior to visit.         Objective:   Vitals:   12/17/18 0842  BP: (!) 145/90  Pulse: 88  Resp: 18  Temp: 97.8 F (36.6 C)  SpO2: 96%     Wt Readings from Last 3 Encounters:  12/17/18 213 lb (96.6 kg)  12/06/18 212 lb 12 oz (96.5 kg)  12/02/18 211 lb (95.7 kg)    Physical Exam:   General Appearance:  Patient sitting comfortably on examination table. Conversational. [name] self-historian. In no acute distress. Afebrile.   Head:  Normocephalic, without obvious abnormality, atraumatic  Eyes:  PERRL, conjunctiva/corneas clear, EOM's intact  Ears:  Bilateral ear canals WNL. No erythema or edema. No discharge/drainage. Bilateral TMs WNL. Shiny. Good light reflex. Possible mild effusion bilaterally. No erythema. No injection. No scar tissue.  Nose: Nares normal, septum midline. Nasal mucosa with bilateral edema. Scant clear rhinorrhea. Patient able to inhale through both nostrils. No sinus tenderness with percussion/palpation. Subjective bilateral maxillary and frontal sinus pressure/congestion.  Throat: Lips, mucosa, and tongue normal; teeth and gums normal. Throat reveals no erythema. Tonsils with no enlargement or exudate.  Neck: Supple, symmetrical, trachea midline, no adenopathy  Lungs:   Clear to auscultation bilaterally, respirations unlabored  Heart:  Regular rate and rhythm  Extremities: Extremities normal, atraumatic, no cyanosis or edema  Pulses: 2+ and symmetric  Skin: Skin color, texture, turgor normal, no rashes or lesions  Lymph nodes: Cervical, supraclavicular, and axillary nodes normal  Neurologic: Normal    Assessment & Plan:    Exam findings, diagnosis etiology and medication use and indications reviewed with patient. Follow-Up and discharge instructions provided. No emergent/urgent issues found on exam.  Patient education was provided.   Patient verbalized understanding of information provided and agrees with plan of care (POC), all questions answered. The patient is advised to call or  return to clinic if condition does not see an improvement in symptoms, or to seek the care of the closest emergency department if condition worsens with the below plan.    1. Dysfunction of both eustachian tubes  - pseudoephedrine (SUDAFED 12 HOUR) 120 MG 12 hr tablet; Take 1 tablet (120 mg total) by mouth 2 (two) times daily for 7 days.  Dispense: 14 tablet; Refill: 0 - loratadine (CLARITIN) 10 MG tablet; Take 1 tablet (10 mg total) by mouth daily for 7 days.  Dispense: 7 tablet; Refill: 0  2. Nasal congestion  Patient with 3 day history of URI symptoms. Rhinorrhea, nasal congestion, sinus pressure, and bilateral ear pain. Afebrile. VSS. TMs without erythema, injection, bulging, or dull coloration on PE. HPI and PE consistent with viral URI with eustachian tube dysfunction. Prescribed Claritin and Sudafed. Advised continuation of Flonase. Discussed increasing fluids and using warm compress over sinuses. Advised re-evaluation by Leodis Binet, PCP, or urgent care in one week if not improving - sooner with any worsening symptoms. Patient agreed with plan.    Darlin Priestly, MHS, PA-C Montey Hora, MHS, PA-C Advanced Practice Provider [facility]  [address] [facility], [place], [place] [address] (p):  [phone]  [email or web address]

## 2018-12-17 NOTE — Patient Instructions (Signed)
Thank you for choosing [facility] for your health care needs.  You have been diagnosed with an upper respiratory infection (a cold) - which has caused runny nose, nasal congestion, sinus pressure, and bilateral ear pain.  Recommend increase fluids. Rest. Continue to use Flonase nasal spray. May also use saline nasal spray. Apply hot/warm compress over sinuses.  You have been prescribed a decongestant (Sudafed) and an antihistamine (Claritin).  Return to [place] (or follow-up with family physician or urgent care) in one week if symptoms not improving, sooner with any worsening symptoms.  Hope you feel better soon!  Eustachian Tube Dysfunction  Eustachian tube dysfunction refers to a condition in which a blockage develops in the narrow passage that connects the middle ear to the back of the nose (eustachian tube). The eustachian tube regulates air pressure in the middle ear by letting air move between the ear and nose. It also helps to drain fluid from the middle ear space. Eustachian tube dysfunction can affect one or both ears. When the eustachian tube does not function properly, air pressure, fluid, or both can build up in the middle ear. What are the causes? This condition occurs when the eustachian tube becomes blocked or cannot open normally. Common causes of this condition include:  Ear infections.  Colds and other infections that affect the nose, mouth, and throat (upper respiratory tract).  Allergies.  Irritation from cigarette smoke.  Irritation from stomach acid coming up into the esophagus (gastroesophageal reflux). The esophagus is the tube that carries food from the mouth to the stomach.  Sudden changes in air pressure, such as from descending in an airplane or scuba diving.  Abnormal growths in the nose or throat, such as: ? Growths that line the nose (nasal polyps). ? Abnormal growth of cells (tumors). ? Enlarged tissue at the back of the throat (adenoids). What  increases the risk? You are more likely to develop this condition if:  You smoke.  You are overweight.  You are a child who has: ? Certain birth defects of the mouth, such as cleft palate. ? Large tonsils or adenoids. What are the signs or symptoms? Common symptoms of this condition include:  A feeling of fullness in the ear.  Ear pain.  Clicking or popping noises in the ear.  Ringing in the ear.  Hearing loss.  Loss of balance.  Dizziness. Symptoms may get worse when the air pressure around you changes, such as when you travel to an area of high elevation, fly on an airplane, or go scuba diving. How is this diagnosed? This condition may be diagnosed based on:  Your symptoms.  A physical exam of your ears, nose, and throat.  Tests, such as those that measure: ? The movement of your eardrum (tympanogram). ? Your hearing (audiometry). How is this treated? Treatment depends on the cause and severity of your condition.  In mild cases, you may relieve your symptoms by moving air into your ears. This is called "popping the ears."  In more severe cases, or if you have symptoms of fluid in your ears, treatment may include: ? Medicines to relieve congestion (decongestants). ? Medicines that treat allergies (antihistamines). ? Nasal sprays or ear drops that contain medicines that reduce swelling (steroids). ? A procedure to drain the fluid in your eardrum (myringotomy). In this procedure, a small tube is placed in the eardrum to:  Drain the fluid.  Restore the air in the middle ear space. ? A procedure to insert a balloon device  through the nose to inflate the opening of the eustachian tube (balloon dilation). Follow these instructions at home: Lifestyle  Do not do any of the following until your health care provider approves: ? Travel to high altitudes. ? Fly in airplanes. ? Work in a Pension scheme manager or room. ? Scuba dive.  Do not use any products that contain  nicotine or tobacco, such as cigarettes and e-cigarettes. If you need help quitting, ask your health care provider.  Keep your ears dry. Wear fitted earplugs during showering and bathing. Dry your ears completely after. General instructions  Take over-the-counter and prescription medicines only as told by your health care provider.  Use techniques to help pop your ears as recommended by your health care provider. These may include: ? Chewing gum. ? Yawning. ? Frequent, forceful swallowing. ? Closing your mouth, holding your nose closed, and gently blowing as if you are trying to blow air out of your nose.  Keep all follow-up visits as told by your health care provider. This is important. Contact a health care provider if:  Your symptoms do not go away after treatment.  Your symptoms come back after treatment.  You are unable to pop your ears.  You have: ? A fever. ? Pain in your ear. ? Pain in your head or neck. ? Fluid draining from your ear.  Your hearing suddenly changes.  You become very dizzy.  You lose your balance. Summary  Eustachian tube dysfunction refers to a condition in which a blockage develops in the eustachian tube.  It can be caused by ear infections, allergies, inhaled irritants, or abnormal growths in the nose or throat.  Symptoms include ear pain, hearing loss, or ringing in the ears.  Mild cases are treated with maneuvers to unblock the ears, such as yawning or ear popping.  Severe cases are treated with medicines. Surgery may also be done (rare). This information is not intended to replace advice given to you by your health care provider. Make sure you discuss any questions you have with your health care provider. Document Released: 01/04/2016 Document Revised: 03/30/2018 Document Reviewed: 03/30/2018 Elsevier Interactive Patient Education  2019 [employer].

## 2018-12-21 ENCOUNTER — Telehealth: Payer: Self-pay | Admitting: Emergency Medicine

## 2018-12-21 NOTE — Telephone Encounter (Signed)
Left message following up on visit with [facility] 

## 2018-12-27 ENCOUNTER — Ambulatory Visit: Payer: Self-pay | Admitting: Physician Assistant

## 2019-01-03 ENCOUNTER — Ambulatory Visit (INDEPENDENT_AMBULATORY_CARE_PROVIDER_SITE_OTHER): Payer: No Typology Code available for payment source

## 2019-01-03 DIAGNOSIS — R0609 Other forms of dyspnea: Principal | ICD-10-CM

## 2019-01-03 DIAGNOSIS — I739 Peripheral vascular disease, unspecified: Secondary | ICD-10-CM | POA: Diagnosis not present

## 2019-01-10 ENCOUNTER — Ambulatory Visit: Payer: Self-pay | Admitting: Physician Assistant

## 2019-01-10 NOTE — Progress Notes (Deleted)
Cardiology Office Note Date:  01/10/2019  Patient ID:  Daniel, Calderon 10/29/1966, MRN [id number] PCP:  [name], MD  Cardiologist:  Dr. Saunders Revel, MD  ***refresh   Chief Complaint: Follow-up  History of Present Illness: Daniel Calderon is a 53 y.o. male with history of thoracic outlet syndrome s/p remote surgical intervention, lumbar disc disease as outlined below, renal stones, migraine disorder, anxiety, depression, sleep apnea, reported narcolepsy, HTN, and HLD who presents for follow-up of leg pain.   He was previously admitted in 02/2017 for TIA-like symptoms with extensive work up being unrevealing.  Patient was seen by Dr. Saunders Revel in 02/2017 for evaluation of DOE at the request of his PCP. At that time he reported a history of chest pain and SOB for many years. Prior cardiac work up showed a patient reported stress test in 2011 that was reported as normal. Echo in 02/2017 showed an EF of 55-60%, normal wall motion, normal diastolic function, no significant valvular abnormalities, normal RV size and function. He underwent ETT that was normal and his symptoms were not felt to be cardiac in etiology.   Patient has recently been evaluated by [facility] in 02/2018 for evaluation of leg pain and weakness with ambulation of short to medium distances. Lower extremity arterial ultrasound and ABIs on 02/19/2018 showed brisk triphasic waveforms with normal bilateral ABIs (right 1.33, left 1.36), and normal digital pressures bilaterally consistent with no arterial insufficiency. Vascular surgery recommended possible MRI of the back or other nerve conduction studies be performed for possible neurogenic claudication. MRI of the lumbar spine showed left foraminal disc protrusion at L3-4 contacting and possibly irritating the L3 nerve root.   Patient was seen by cardiology on 12/06/2018 for repeat evaluation of lower extremity weakness/cramping as well as chest pain.  In this  setting, he underwent exercise ABIs on 01/03/2019 that were normal.  He also underwent treadmill Myoview on 12/13/2018 that was normal with an EF of 55 to 65%.  The patient demonstrated good exercise capacity with a duration of 9 minutes and a workload of 10.10 METs.  It was recommended the patient follow-up with PCP/neurosurgery for his lower extremity symptoms, in the setting of the above MRI.  Patient reported to the vascular tach during his exercise ABIs that he had never been told of his abnormal MRI findings.  I discussed this with the patient in detail at his office visit on 12/06/2018.  It was also noted in PCP notes that the patient deferred neurosurgery evaluation on 03/12/2018.  ***   Past Medical History:  Diagnosis Date  . Allergy    Seasonal  . Anxiety   . Benign enlargement of prostate   . Depression   . Elevated BP   . Elevated transaminase level   . Failure of erection   . Fatigue   . Frequent headaches   . GERD (gastroesophageal reflux disease)   . Hypogonadism in male   . Kidney stones    History of kidney stones  . Migraine   . Sleep apnea    Currently uses cpap  . Thoracic outlet syndrome    Left Arm    Past Surgical History:  Procedure Laterality Date  . COLONOSCOPY WITH PROPOFOL N/A 11/11/2017   Procedure: COLONOSCOPY WITH PROPOFOL;  Surgeon: Lin Landsman, MD;  Location: [facility];  Service: Endoscopy;  Laterality: N/A;  . KNEE ARTHROSCOPY WITH MEDIAL MENISECTOMY Left 07/26/2018   Procedure: KNEE ARTHROSCOPY WITH PARTIAL MEDIAL  MENISECTOMY;  Surgeon: Leim Fabry, MD;  Location: [facility];  Service: Orthopedics;  Laterality: Left;  . Left Shoulder Surgery  2011  . nerve block     2 in neck. 5 in back.  . NOSE SURGERY    . POLYPECTOMY  11/11/2017   Procedure: POLYPECTOMY;  Surgeon: Lin Landsman, MD;  Location: [facility];  Service: Endoscopy;;  . SCALENE NODE BIOPSY / EXCISION    . VASECTOMY      No outpatient medications  have been marked as taking for the 01/11/19 encounter (Appointment) with Rise Mu, PA-C.    Allergies:   Erythromycin; Cephalexin; Dexamethasone; Oxycodone-acetaminophen; Prednisone; Gabapentin; and Ibuprofen   Social History:  The patient  reports that he has never smoked. He has never used smokeless tobacco. He reports current alcohol use of about 3.0 standard drinks of alcohol per week. He reports that he does not use drugs.   Family History:  The patient's family history includes Diabetes in his brother; Healthy in his daughter, father, and Daniel; Hearing loss in his maternal grandmother and mother; Heart attack in his paternal uncle; Heart disease in an other family member; Hypertension in his mother; Stroke in his paternal uncle.  ROS:   ROS   PHYSICAL EXAM: *** VS:  There were no vitals taken for this visit. BMI: There is no height or weight on file to calculate BMI.  Physical Exam  Well nourished, well developed, in no acute distress  HEENT: normocephalic, atraumatic  Neck: no JVD, carotid bruits or masses Cardiac: normal S1, S2; RRR; no murmurs, rubs, or gallops Lungs: clear to auscultation bilaterally, no wheezing, rhonchi or rales  Abd: soft, nontender, no hepatomegaly, + BS MS: no deformity or atrophy Ext: no edema  Skin: warm and dry, no rash Neuro:  moves all extremities spontaneously, no focal abnormalities noted, follows commands Psych: euthymic mood, full affect   EKG:  Was ordered and interpreted by me today. Shows ***  Recent Labs: 05/25/2018: ALT 132; BUN 18; Creatinine, Ser 1.11; Hemoglobin 15.2; Platelets 159; Potassium 3.8; Sodium 138  No results found for requested labs within last 8760 hours.   CrCl cannot be calculated (Patient's most recent lab result is older than the maximum 21 days allowed.).   Wt Readings from Last 3 Encounters:  12/17/18 213 lb (96.6 kg)  12/06/18 212 lb 12 oz (96.5 kg)  12/02/18 211 lb (95.7 kg)     Other studies  reviewed: Additional studies/records reviewed today include: summarized above  ASSESSMENT AND PLAN:  1. ***  Disposition: F/u with *** in   Current medicines are reviewed at length with the patient today.  The patient did not have any concerns regarding medicines.  [name] Banker PA-C 01/10/2019 7:44 AM     [place] [address] [place] [place], [place] [address] [phone]

## 2019-01-11 ENCOUNTER — Ambulatory Visit: Payer: Self-pay | Admitting: Physician Assistant

## 2019-01-12 NOTE — Progress Notes (Signed)
Cardiology Office Note Date:  01/13/2019  Patient ID:  Daniel Calderon 03-May-1966, MRN [id number] PCP:  [name], MD  Cardiologist:  Dr. Saunders Revel, MD    Chief Complaint: Follow up   History of Present Illness: Daniel Calderon is a 53 y.o. male with history of thoracic outlet syndrome s/p remote surgical intervention, lumbar disc disease as outlined below, renal stones, migraine disorder, anxiety, depression, sleep apnea, reported narcolepsy, HTN, and HLD who presents for follow-up of leg pain.   He was previously admitted in 02/2017 for TIA-like symptoms with extensive work up being unrevealing.  Patient was seen by Dr. Saunders Revel in 02/2017 for evaluation of DOE at the request of his PCP. At that time he reported a history of chest pain and SOB for many years. Prior cardiac work up showed a patient reported stress test in 2011 that was reported as normal. Echo in 02/2017 showed an EF of 55-60%, normal wall motion, normal diastolic function, no significant valvular abnormalities, normal RV size and function. He underwent ETT that was normal and his symptoms were not felt to be cardiac in etiology.   Patient has recently been evaluated by [facility] in 02/2018 for evaluation of leg pain and weakness with ambulation of short to medium distances. Lower extremity arterial ultrasound and ABIs on 02/19/2018 showed brisk triphasic waveforms with normal bilateral ABIs (right 1.33, left 1.36), and normal digital pressures bilaterally consistent with no arterial insufficiency. Vascular surgery recommended possible MRI of the back or other nerve conduction studies be performed for possible neurogenic claudication. MRI of the lumbar spine showed left foraminal disc protrusion at L3-4 contacting and possibly irritating the L3 nerve root.   Patient was seen by cardiology on 12/06/2018 for repeat evaluation of lower extremity weakness/cramping as well as chest pain.  In this setting, he  underwent exercise ABIs on 01/03/2019 that were normal.  He also underwent treadmill Myoview on 12/13/2018 that was normal with an EF of 55 to 65%.  The patient demonstrated good exercise capacity with a duration of 9 minutes and a workload of 10.10 METs.  It was recommended the patient follow-up with PCP/neurosurgery for his lower extremity symptoms, in the setting of the above MRI.  Patient reported to the vascular tach during his exercise ABIs that he had never been told of his abnormal MRI findings.  I discussed this with the patient in detail at his office visit on 12/06/2018.  It was also noted in PCP notes that the patient deferred neurosurgery evaluation on 03/12/2018.  Patient comes in with his wife today. His lower extremity cramping and numbness remains unchanged. He is now willing to proceed with neurosurgeon evaluation. His exertional dyspnea has resolved. No dizziness, presyncope, or syncope. NO lower extremity swelling, abdominal distension, orthopnea, PND, or early satiety. He is trying to exercise to help with his BP, though this is limited secondary to lower extremity cramps and numbness. BP typically runs in the 725D systolic at home. No cardiac complaints today.    Past Medical History:  Diagnosis Date  . Allergy    Seasonal  . Anxiety   . Benign enlargement of prostate   . Depression   . Elevated BP   . Elevated transaminase level   . Failure of erection   . Fatigue   . Frequent headaches   . GERD (gastroesophageal reflux disease)   . Hypogonadism in male   . Kidney stones    History of kidney stones  .  Migraine   . Sleep apnea    Currently uses cpap  . Thoracic outlet syndrome    Left Arm    Past Surgical History:  Procedure Laterality Date  . COLONOSCOPY WITH PROPOFOL N/A 11/11/2017   Procedure: COLONOSCOPY WITH PROPOFOL;  Surgeon: Lin Landsman, MD;  Location: [facility];  Service: Endoscopy;  Laterality: N/A;  . KNEE ARTHROSCOPY WITH MEDIAL  MENISECTOMY Left 07/26/2018   Procedure: KNEE ARTHROSCOPY WITH PARTIAL MEDIAL MENISECTOMY;  Surgeon: Leim Fabry, MD;  Location: [facility];  Service: Orthopedics;  Laterality: Left;  . Left Shoulder Surgery  2011  . nerve block     2 in neck. 5 in back.  . NOSE SURGERY    . POLYPECTOMY  11/11/2017   Procedure: POLYPECTOMY;  Surgeon: Lin Landsman, MD;  Location: [facility];  Service: Endoscopy;;  . SCALENE NODE BIOPSY / EXCISION    . VASECTOMY      Current Meds  Medication Sig  . amLODipine (NORVASC) 10 MG tablet TAKE 1 TABLET BY MOUTH DAILY.  . calcipotriene (DOVONOX) 0.005 % cream Apply 1 application topically daily as needed (for psoriasis).  . Multiple Vitamin (MULTIVITAMIN) tablet Take 1 tablet by mouth daily.  Marland Kitchen omeprazole (PRILOSEC) 20 MG capsule TAKE 1 CAPSULE BY MOUTH DAILY  . sertraline (ZOLOFT) 100 MG tablet TAKE 1 TABLET BY MOUTH ONCE DAILY  . testosterone (ANDROGEL) 50 MG/5GM (1%) GEL Place 5 g onto the skin daily.    Allergies:   Erythromycin; Cephalexin; Dexamethasone; Oxycodone-acetaminophen; Prednisone; Gabapentin; and Ibuprofen   Social History:  The patient  reports that he has never smoked. He has never used smokeless tobacco. He reports current alcohol use of about 3.0 standard drinks of alcohol per week. He reports that he does not use drugs.   Family History:  The patient's family history includes Diabetes in his brother; Healthy in his daughter, father, and son; Hearing loss in his maternal grandmother and mother; Heart attack in his paternal uncle; Heart disease in an other family member; Hypertension in his mother; Stroke in his paternal uncle.  ROS:   Review of Systems  Constitutional: Positive for malaise/fatigue. Negative for chills, diaphoresis, fever and weight loss.  HENT: Negative for congestion.   Eyes: Negative for discharge and redness.  Respiratory: Negative for cough, hemoptysis, sputum production, shortness of breath and wheezing.     Cardiovascular: Positive for claudication. Negative for chest pain, palpitations, orthopnea, leg swelling and PND.  Gastrointestinal: Negative for abdominal pain, blood in stool, heartburn, melena, nausea and vomiting.  Genitourinary: Negative for hematuria.  Musculoskeletal: Negative for falls and myalgias.  Skin: Negative for rash.  Neurological: Positive for weakness. Negative for dizziness, tingling, tremors, sensory change, speech change, focal weakness and loss of consciousness.  Endo/Heme/Allergies: Does not bruise/bleed easily.  Psychiatric/Behavioral: Negative for substance abuse. The patient is not nervous/anxious.   All other systems reviewed and are negative.    PHYSICAL EXAM:  VS:  BP 138/82 (BP Location: Left Arm, Patient Position: Sitting, Cuff Size: Normal)   Pulse 85   Ht 6\' 2"  (1.88 m)   Wt 217 lb (98.4 kg)   BMI 27.86 kg/m  BMI: Body mass index is 27.86 kg/m.  Physical Exam  Constitutional: He is oriented to person, place, and time. He appears well-developed and well-nourished.  HENT:  Head: Normocephalic and atraumatic.  Eyes: Right eye exhibits no discharge. Left eye exhibits no discharge.  Neck: Normal range of motion. No JVD present.  Cardiovascular: Normal rate,  regular rhythm, S1 normal, S2 normal and normal heart sounds. Exam reveals no distant heart sounds, no friction rub, no midsystolic click and no opening snap.  No murmur heard. Pulmonary/Chest: Effort normal and breath sounds normal. No respiratory distress. He has no decreased breath sounds. He has no wheezes. He has no rales. He exhibits no tenderness.  Abdominal: Soft. He exhibits no distension. There is no abdominal tenderness.  Musculoskeletal:        General: No edema.  Neurological: He is alert and oriented to person, place, and time.  Skin: Skin is warm and dry. No cyanosis. Nails show no clubbing.  Psychiatric: He has a normal mood and affect. His speech is normal and behavior is normal.  Judgment and thought content normal.     EKG:  Was ordered and interpreted by me today. Shows NSR, 85 bpm, no acute st/t changes   Recent Labs: 05/25/2018: ALT 132; BUN 18; Creatinine, Ser 1.11; Hemoglobin 15.2; Platelets 159; Potassium 3.8; Sodium 138  No results found for requested labs within last 8760 hours.   CrCl cannot be calculated (Patient's most recent lab result is older than the maximum 21 days allowed.).   Wt Readings from Last 3 Encounters:  01/13/19 217 lb (98.4 kg)  12/17/18 213 lb (96.6 kg)  12/06/18 212 lb 12 oz (96.5 kg)     Other studies reviewed: Additional studies/records reviewed today include: summarized above  ASSESSMENT AND PLAN:  1. Lower extremity pain: Extensive PAD work up is unrevealing. Symptoms possibly consistent neurogenic claudication. He is noted to have left foraminal disc protrusion at L3-4 contacting and possibly irritating the L3 nerve root on recent MRI. He was again advised to follow up with his PCP for neurosurgeon referral as previously directed by cardiology and PCP. No further cardiac workup is needed.   2. Exertional dyspnea: Improved. Myoview normal. Continue with risk factor modification and primary prevention. No further cardiac testing needed.   3. Sleep disordered breathing/fatigue/somnolence: Compliant with CPAP. He has been referred to a sleep specialist. Laboratory evaluation unrevealing including HCT, testosterone, and chemistries.  4. HTN: Blood pressure is slightly elevated today. Add losartan 25 mg daily. Continue amlodipine 10 mg daily. Low sodium diet.    Disposition: F/u with Dr. Saunders Revel or an APP in 3 months.   Current medicines are reviewed at length with the patient today.  The patient did not have any concerns regarding medicines.  Signed, Christell Faith, PA-C 01/13/2019 3:07 PM     [address] [place], [place] [address] [phone]

## 2019-01-13 ENCOUNTER — Ambulatory Visit: Payer: No Typology Code available for payment source | Admitting: Physician Assistant

## 2019-01-13 ENCOUNTER — Encounter: Payer: Self-pay | Admitting: Physician Assistant

## 2019-01-13 ENCOUNTER — Telehealth: Payer: Self-pay | Admitting: Family Medicine

## 2019-01-13 VITALS — BP 138/82 | HR 85 | Ht 74.0 in | Wt 217.0 lb

## 2019-01-13 DIAGNOSIS — I10 Essential (primary) hypertension: Secondary | ICD-10-CM

## 2019-01-13 DIAGNOSIS — M79604 Pain in right leg: Secondary | ICD-10-CM

## 2019-01-13 DIAGNOSIS — R0609 Other forms of dyspnea: Secondary | ICD-10-CM | POA: Diagnosis not present

## 2019-01-13 DIAGNOSIS — G4730 Sleep apnea, unspecified: Secondary | ICD-10-CM

## 2019-01-13 DIAGNOSIS — M5136 Other intervertebral disc degeneration, lumbar region: Secondary | ICD-10-CM

## 2019-01-13 DIAGNOSIS — M79605 Pain in left leg: Secondary | ICD-10-CM

## 2019-01-13 MED ORDER — LOSARTAN POTASSIUM 25 MG PO TABS: 25.0000 mg | ORAL_TABLET | Freq: Every day | ORAL | 3 refills | Status: DC

## 2019-01-13 NOTE — Telephone Encounter (Signed)
Please let the patient know that I received a message from Christell Faith stating that the patient is now willing to proceed with a neurosurgery evaluation for his leg symptoms.  If he is willing to do this I can place a referral.  Thanks.

## 2019-01-13 NOTE — Patient Instructions (Addendum)
Medication Instructions: Your physician has recommended you make the following change in your medication:  1- START Losartan 1 tablet (25 mg total) once daily   If you need a refill on your cardiac medications before your next appointment, please call your pharmacy.   Lab work: Please return for lab work in one week, you can make clinic appointment at check out. (fasting lipid, CMET)  If you have labs (blood work) drawn today and your tests are completely normal, you will receive your results only by: Marland Kitchen MyChart Message (if you have MyChart) OR . A paper copy in the mail If you have any lab test that is abnormal or we need to change your treatment, we will call you to review the results.  Testing/Procedures: None ordered   Follow-Up: At [facility], you and your health needs are our priority.  As part of our continuing mission to provide you with exceptional heart care, we have created designated Provider Care Teams.  These Care Teams include your primary Cardiologist (physician) and Advanced Practice Providers (APPs -  Physician Assistants and Nurse Practitioners) who all work together to provide you with the care you need, when you need it. You will need a follow up appointment in 3 months. You may see Dr. Saunders Revel  or one of the following Advanced Practice Providers on your designated Care Team:   Murray Hodgkins, NP Christell Faith, PA-C . Marrianne Mood, PA-C

## 2019-01-13 NOTE — Telephone Encounter (Signed)
-----   Message from Rise Mu, PA-C sent at 01/13/2019  3:37 PM EST ----- Dr. Caryl Bis, Mr. Yeatts is now ok with proceeding with neurosurgeon evaluation for his symptoms. Just an FYI.

## 2019-01-14 NOTE — Telephone Encounter (Signed)
Called and spoke with patient. Pt advised and he would like to have referral placed.

## 2019-01-14 NOTE — Telephone Encounter (Signed)
Referral placed.

## 2019-01-14 NOTE — Addendum Note (Signed)
Addended by: Caryl Bis, Malee Grays G on: 01/14/2019 12:04 PM   Modules accepted: Orders

## 2019-01-20 ENCOUNTER — Other Ambulatory Visit: Payer: Self-pay

## 2019-01-31 ENCOUNTER — Other Ambulatory Visit: Payer: Self-pay | Admitting: Family Medicine

## 2019-01-31 NOTE — Telephone Encounter (Signed)
Last OV 05/11/2018   Last refilled 10/25/2018 disp 30 with 2 refills

## 2019-02-01 ENCOUNTER — Institutional Professional Consult (permissible substitution): Payer: Self-pay | Admitting: Neurology

## 2019-02-07 ENCOUNTER — Telehealth: Payer: Self-pay | Admitting: Physician Assistant

## 2019-02-07 NOTE — Telephone Encounter (Signed)
Attempted to call patient. LMTCB 02/07/2019

## 2019-02-07 NOTE — Telephone Encounter (Signed)
Pt c/o medication issue:  1. Name of Medication: unknown med ryan started patient on recently for issues with legs   2. How are you currently taking this medication (dosage and times per day)? Unknown   3. Are you having a reaction (difficulty breathing--STAT)? No  4. What is your medication issue? Patient c/o increased weakness in legs states medication has made issues worse and waking bp is 130-140/100

## 2019-02-08 NOTE — Telephone Encounter (Signed)
Rarely, some people will have arthralgias/myalgias with ARB use.  If patient needs, can discontinue losartan and see if leg symptoms improve.  However, I do not think his leg symptoms will resolve with discontinuing ARB.  I continue to recommend the patient follow-up with neurosurgery as was discussed at his visit if he has not already done so.  With the discontinuation of losartan he should monitor his blood pressure and let us know if he consistently gets readings greater than 140/90.

## 2019-02-08 NOTE — Telephone Encounter (Signed)
Called patient. States he's already stopped the losartan for about 1 week and the pain/ discomfort has improved. He still has the initial symptoms and is in the process of following up with neurosurgery. He verbalized understanding to let us know if BP consistently >140/90. Losartan removed from med list.

## 2019-02-08 NOTE — Telephone Encounter (Signed)
Patient returning call.

## 2019-02-08 NOTE — Telephone Encounter (Signed)
I spoke with the patient. He states that the issues with his legs have gotten worse since starting losartan. Patient seen on 01/13/19 and started losartan 25 mg once daily per Christell Faith, PA. The patient states his BP has been running 143-152/72-103. He has checked this at various times throughout the day.  His main issue is a "cramping"/ "squeezing" feeling in his legs. He has also been having a headache for the last 3 days.   He feels he cannot take the current dose of losartan.  I advised we will need to review with Thurmond Butts, PA and call him back with any further recommendations. The patient voices understanding and is agreeable.

## 2019-02-09 ENCOUNTER — Ambulatory Visit (INDEPENDENT_AMBULATORY_CARE_PROVIDER_SITE_OTHER): Payer: No Typology Code available for payment source | Admitting: Family Medicine

## 2019-02-09 ENCOUNTER — Encounter: Payer: Self-pay | Admitting: Family Medicine

## 2019-02-09 VITALS — BP 121/67 | HR 99 | Temp 98.2°F | Ht 74.0 in | Wt 208.8 lb

## 2019-02-09 DIAGNOSIS — K146 Glossodynia: Secondary | ICD-10-CM | POA: Diagnosis not present

## 2019-02-09 HISTORY — DX: Glossodynia: K14.6

## 2019-02-09 LAB — VITAMIN B12: Vitamin B-12: 480 pg/mL (ref 211–911)

## 2019-02-09 LAB — FOLATE: Folate: 21.7 ng/mL

## 2019-02-09 NOTE — Assessment & Plan Note (Signed)
Undetermined cause.  Does not appear consistent with glossitis.  Potentially could be fissured tongue.  We will check for B12 and folate deficiency.  He will contact his dentist for evaluation.  If his dentist is unsure of a cause the patient will let us know and we will refer to ENT.

## 2019-02-09 NOTE — Progress Notes (Signed)
  Tommi Rumps, MD Phone: [phone]  Daniel Calderon is a 53 y.o. male who presents today for follow-up.  CC: Tongue cuts and pain  Patient notes symptoms started 2 weeks ago.  He has developed cuts on the bilateral edges of his tongue extending towards the center though not down the center.  Notes no history of this.  No tongue swelling.  Notes the cuts hurt.  He initially felt this was related to balsamic vinegar though he thinks it is not now.  He has had no throat swelling or lip swelling.  No rash elsewhere.  No new medications.  He has tried switching toothpaste and the liquids he takes in.  He notes no dry mouth.  He has not seen his dentist yet.  Social History   Tobacco Use  Smoking Status Never Smoker  Smokeless Tobacco Never Used     ROS see history of present illness  Objective  Physical Exam Vitals:   02/09/19 1135  BP: 121/67  Pulse: 99  Temp: 98.2 F (36.8 C)  SpO2: 90%    BP Readings from Last 3 Encounters:  02/09/19 121/67  01/13/19 138/82  12/17/18 (!) 145/90   Wt Readings from Last 3 Encounters:  02/09/19 208 lb 12.8 oz (94.7 kg)  01/13/19 217 lb (98.4 kg)  12/17/18 213 lb (96.6 kg)    Physical Exam Constitutional:      General: He is not in acute distress.    Appearance: He is not diaphoretic.  HENT:     Mouth/Throat:      Comments: Cuts on the tongue surface in the distribution as outlined above, there is no erythema, papillary architecture remains intact, no tongue tenderness, no palpable abnormalities inferior to his tongue Cardiovascular:     Rate and Rhythm: Normal rate and regular rhythm.     Heart sounds: Normal heart sounds.  Pulmonary:     Effort: Pulmonary effort is normal.     Breath sounds: Normal breath sounds.  Lymphadenopathy:     Cervical: No cervical adenopathy.  Skin:    General: Skin is warm and dry.  Neurological:     Mental Status: He is alert.      Assessment/Plan: Please see individual problem  list.  Tongue pain Undetermined cause.  Does not appear consistent with glossitis.  Potentially could be fissured tongue.  We will check for B12 and folate deficiency.  He will contact his dentist for evaluation.  If his dentist is unsure of a cause the patient will let us know and we will refer to ENT.    Orders Placed This Encounter  Procedures  . B12  . Folate    No orders of the defined types were placed in this encounter.    Tommi Rumps, MD [place]

## 2019-02-09 NOTE — Patient Instructions (Signed)
Nice to see you. We will check lab work. Please contact your dentist to see if they can evaluate your tongue as well.  If they do not have any ideas regarding cause please let us know and we can have you see an ear nose and throat physician.

## 2019-02-18 ENCOUNTER — Encounter: Payer: Self-pay | Admitting: Family Medicine

## 2019-02-18 DIAGNOSIS — R5382 Chronic fatigue, unspecified: Secondary | ICD-10-CM

## 2019-02-18 DIAGNOSIS — S1096XA Insect bite of unspecified part of neck, initial encounter: Secondary | ICD-10-CM

## 2019-02-18 DIAGNOSIS — W57XXXA Bitten or stung by nonvenomous insect and other nonvenomous arthropods, initial encounter: Secondary | ICD-10-CM

## 2019-02-21 ENCOUNTER — Telehealth: Payer: Self-pay

## 2019-02-21 NOTE — Telephone Encounter (Signed)
Copied from [place] [id number]. Topic: General - Other >> Feb 21, 2019 11:01 AM Jeri Cos wrote: Reason for CRM: Pt called wanting Dr. Josephina Gip to know that he contacted [facility] and they informed pt that as long as the office codes his Lyme disease test as blood work or diagnostic, they would cover the cost of the test.

## 2019-02-22 NOTE — Telephone Encounter (Signed)
Yes he would need to come in to be seen by me or margeret for use to evaluate him and then order labs  If he would prefer to wait until Dr Caryl Bis is back that is OK also  But if he needs to come in sooner, we are available to him  LG

## 2019-02-22 NOTE — Telephone Encounter (Signed)
I am not seeing any lyme disease tests ordered or pending at this time. Will leave for PCP to address.

## 2019-02-22 NOTE — Telephone Encounter (Signed)
Sent to covering PA if able to change orders. If not will hold until PCP is back in the office. Thanks

## 2019-02-27 NOTE — Telephone Encounter (Signed)
My chart message sent to the patient to clarify if there has been tick exposure. Will place order once he responds.

## 2019-02-28 NOTE — Telephone Encounter (Signed)
Sent to PCP to review information

## 2019-03-03 ENCOUNTER — Other Ambulatory Visit (INDEPENDENT_AMBULATORY_CARE_PROVIDER_SITE_OTHER): Payer: No Typology Code available for payment source

## 2019-03-03 ENCOUNTER — Other Ambulatory Visit: Payer: Self-pay

## 2019-03-03 DIAGNOSIS — W57XXXA Bitten or stung by nonvenomous insect and other nonvenomous arthropods, initial encounter: Secondary | ICD-10-CM

## 2019-03-03 DIAGNOSIS — R5382 Chronic fatigue, unspecified: Secondary | ICD-10-CM

## 2019-03-03 DIAGNOSIS — S1096XA Insect bite of unspecified part of neck, initial encounter: Secondary | ICD-10-CM

## 2019-03-04 ENCOUNTER — Other Ambulatory Visit: Payer: Self-pay | Admitting: Family Medicine

## 2019-03-04 LAB — B. BURGDORFI ANTIBODIES: B burgdorferi Ab IgG+IgM: <0.9 index

## 2019-03-06 ENCOUNTER — Telehealth: Payer: Self-pay | Admitting: *Deleted

## 2019-03-06 NOTE — Telephone Encounter (Signed)
Left patient message on voicemail that our office will be closed, on 03/07/2019, to disinfect.  Informed him that we will call back to reschedule his appointment.

## 2019-03-07 ENCOUNTER — Institutional Professional Consult (permissible substitution): Payer: Self-pay | Admitting: Neurology

## 2019-03-07 ENCOUNTER — Encounter: Payer: Self-pay | Admitting: Family Medicine

## 2019-03-07 NOTE — Telephone Encounter (Signed)
Sent to PCP to advise 

## 2019-03-09 ENCOUNTER — Telehealth: Payer: Self-pay | Admitting: *Deleted

## 2019-03-09 DIAGNOSIS — E291 Testicular hypofunction: Secondary | ICD-10-CM

## 2019-03-09 NOTE — Telephone Encounter (Signed)
Spoke with patient-he verbalized he would run out of Testosterone. Lab appt r/s for 03/10/2019-patient aware.

## 2019-03-09 NOTE — Telephone Encounter (Signed)
Left voicemail regarding testosterone refill-has an appointment for labs 03/16/2019.

## 2019-03-10 ENCOUNTER — Other Ambulatory Visit: Payer: No Typology Code available for payment source

## 2019-03-10 ENCOUNTER — Other Ambulatory Visit: Payer: Self-pay

## 2019-03-10 DIAGNOSIS — I10 Essential (primary) hypertension: Secondary | ICD-10-CM

## 2019-03-11 LAB — LIPID PANEL
CHOL/HDL RATIO: 4.6 ratio (ref 0.0–5.0)
CHOLESTEROL TOTAL: 184 mg/dL (ref 100–199)
HDL: 40 mg/dL (ref >39)
LDL CALC: 95 mg/dL (ref 0–99)
Triglycerides: 245 mg/dL — ABNORMAL HIGH (ref 0–149)
VLDL Cholesterol Cal: 49 mg/dL — ABNORMAL HIGH (ref 5–40)

## 2019-03-11 LAB — COMPREHENSIVE METABOLIC PANEL
ALK PHOS: 90 IU/L (ref 39–117)
ALT: 111 IU/L — ABNORMAL HIGH (ref 0–44)
AST: 67 IU/L — ABNORMAL HIGH (ref 0–40)
Albumin/Globulin Ratio: 2.8 — ABNORMAL HIGH (ref 1.2–2.2)
Albumin: 4.8 g/dL (ref 4.0–5.0)
BUN/Creatinine Ratio: 15 (ref 9–20)
BUN: 17 mg/dL (ref 6–24)
Bilirubin Total: 0.4 mg/dL (ref 0.0–1.2)
CO2: 24 mmol/L (ref 20–29)
Calcium: 9.5 mg/dL (ref 8.7–10.2)
Chloride: 103 mmol/L (ref 96–106)
Creatinine, Ser: 1.16 mg/dL (ref 0.76–1.27)
GFR calc Af Amer: 84 mL/min/{1.73_m2} (ref >59)
GFR calc non Af Amer: 73 mL/min/{1.73_m2} (ref >59)
Globulin, Total: 1.7 g/dL (ref 1.5–4.5)
Glucose: 123 mg/dL — ABNORMAL HIGH (ref 65–99)
Potassium: 4.4 mmol/L (ref 3.5–5.2)
Sodium: 141 mmol/L (ref 134–144)
Total Protein: 6.5 g/dL (ref 6.0–8.5)

## 2019-03-14 ENCOUNTER — Telehealth: Payer: Self-pay | Admitting: Urology

## 2019-03-14 MED ORDER — TESTOSTERONE 50 MG/5GM (1%) TD GEL: 5.0000 g | Freq: Every day | TRANSDERMAL | 0 refills | Status: DC

## 2019-03-14 NOTE — Telephone Encounter (Signed)
Notified patient RX was sent in for one month-will contact him when his results come in-verbalized understanding.

## 2019-03-14 NOTE — Addendum Note (Signed)
Addended by: Verlene Mayer A on: 03/14/2019 11:40 AM   Modules accepted: Orders

## 2019-03-14 NOTE — Addendum Note (Signed)
Addended by: Abbie Sons on: 03/14/2019 01:13 PM   Modules accepted: Orders

## 2019-03-14 NOTE — Telephone Encounter (Signed)
I went ahead and sent in a 1 month supply medication.  We will contact him with his level.

## 2019-03-14 NOTE — Telephone Encounter (Signed)
Spoke with patient, testosterone results not resulted yet. Patient is out of Androgel.

## 2019-03-14 NOTE — Telephone Encounter (Signed)
Pt LMOM and states he is returning a call for test results. Please advise.

## 2019-03-15 ENCOUNTER — Telehealth: Payer: Self-pay | Admitting: Physician Assistant

## 2019-03-15 NOTE — Telephone Encounter (Signed)
Notes recorded by Rise Mu, PA-C on 03/11/2019 at 10:33 AM EDT Random glucose is ok.  Renal function is normal.  Potassium is at goal.  AST/ALT remain elevated, though are improved. Follow up with PCP.  LDL is improved.  TG is elevated. Recommend he decrease sugary foods and drinks.

## 2019-03-15 NOTE — Telephone Encounter (Signed)
I left a message for the patient to call back to discuss results.

## 2019-03-16 ENCOUNTER — Other Ambulatory Visit: Payer: No Typology Code available for payment source

## 2019-03-17 NOTE — Telephone Encounter (Signed)
Patient returning call.

## 2019-03-17 NOTE — Telephone Encounter (Signed)
I left a message of the patient to call back for results.

## 2019-03-17 NOTE — Telephone Encounter (Signed)
Patient showed up here at the office regarding results and wanted to know if everything was good. He did not recognize the name of the provider and was wanting some answers. Reviewed that during this time due to the virus and all of the changes that are going on that the APPs are assisting providers with various results and information. Reviewed his results in detail with him and he verbalized understanding with no further questions at this time.

## 2019-03-21 ENCOUNTER — Encounter: Payer: Self-pay | Admitting: Emergency Medicine

## 2019-03-21 ENCOUNTER — Emergency Department
Admission: EM | Admit: 2019-03-21 | Discharge: 2019-03-21 | Disposition: A | Discharge status: Home / Self Care | Payer: No Typology Code available for payment source | Attending: Emergency Medicine | Admitting: Emergency Medicine

## 2019-03-21 ENCOUNTER — Telehealth: Payer: Self-pay

## 2019-03-21 ENCOUNTER — Other Ambulatory Visit: Payer: Self-pay

## 2019-03-21 ENCOUNTER — Emergency Department: Payer: No Typology Code available for payment source

## 2019-03-21 DIAGNOSIS — R05 Cough: Secondary | ICD-10-CM | POA: Diagnosis present

## 2019-03-21 DIAGNOSIS — Z79899 Other long term (current) drug therapy: Secondary | ICD-10-CM | POA: Diagnosis not present

## 2019-03-21 DIAGNOSIS — I10 Essential (primary) hypertension: Secondary | ICD-10-CM | POA: Diagnosis not present

## 2019-03-21 DIAGNOSIS — J189 Pneumonia, unspecified organism: Secondary | ICD-10-CM | POA: Diagnosis not present

## 2019-03-21 LAB — BASIC METABOLIC PANEL
Anion gap: 9 (ref 5–15)
BUN: 16 mg/dL (ref 6–20)
CO2: 26 mmol/L (ref 22–32)
Calcium: 9.7 mg/dL (ref 8.9–10.3)
Chloride: 107 mmol/L (ref 98–111)
Creatinine, Ser: 0.85 mg/dL (ref 0.61–1.24)
GFR calc Af Amer: >60 mL/min (ref >60)
GFR calc non Af Amer: >60 mL/min (ref >60)
Glucose, Bld: 105 mg/dL — ABNORMAL HIGH (ref 70–99)
Potassium: 4.2 mmol/L (ref 3.5–5.1)
Sodium: 142 mmol/L (ref 135–145)

## 2019-03-21 LAB — CBC WITH DIFFERENTIAL/PLATELET
Abs Immature Granulocytes: 0.03 10*3/uL (ref 0.00–0.07)
Basophils Absolute: 0.1 10*3/uL (ref 0.0–0.1)
Basophils Relative: 1 %
Eosinophils Absolute: 0.3 10*3/uL (ref 0.0–0.5)
Eosinophils Relative: 4 %
HCT: 38.8 % — ABNORMAL LOW (ref 39.0–52.0)
HEMOGLOBIN: 12.4 g/dL — AB (ref 13.0–17.0)
Immature Granulocytes: 1 %
LYMPHS PCT: 19 %
Lymphs Abs: 1.2 10*3/uL (ref 0.7–4.0)
MCH: 26.1 pg (ref 26.0–34.0)
MCHC: 32 g/dL (ref 30.0–36.0)
MCV: 81.7 fL (ref 80.0–100.0)
MONO ABS: 0.5 10*3/uL (ref 0.1–1.0)
Monocytes Relative: 7 %
Neutro Abs: 4.3 10*3/uL (ref 1.7–7.7)
Neutrophils Relative %: 68 %
Platelets: 200 10*3/uL (ref 150–400)
RBC: 4.75 MIL/uL (ref 4.22–5.81)
RDW: 12 % (ref 11.5–15.5)
WBC: 6.2 10*3/uL (ref 4.0–10.5)
nRBC: 0 % (ref 0.0–0.2)

## 2019-03-21 LAB — INFLUENZA PANEL BY PCR (TYPE A & B)
Influenza A By PCR: NEGATIVE
Influenza B By PCR: NEGATIVE

## 2019-03-21 LAB — TROPONIN I: Troponin I: <0.03 ng/mL (ref <0.03)

## 2019-03-21 MED ORDER — AZITHROMYCIN 250 MG PO TABS: ORAL_TABLET | ORAL | 0 refills | Status: AC

## 2019-03-21 MED ORDER — SODIUM CHLORIDE 0.9 % IV BOLUS
1000.0000 mL | Freq: Once | INTRAVENOUS | Status: AC
  Administered 2019-03-21: 1000 mL via INTRAVENOUS

## 2019-03-21 MED ORDER — ALBUTEROL SULFATE HFA 108 (90 BASE) MCG/ACT IN AERS: 2.0000 | INHALATION_SPRAY | Freq: Four times a day (QID) | RESPIRATORY_TRACT | 0 refills | Status: DC | PRN

## 2019-03-21 NOTE — Telephone Encounter (Signed)
Called pt and left a VM to call back and advised that I called Malachy Mood as well and was unable to leave a VM due to mailbox being full. It appears  pt has went to the ED today.

## 2019-03-21 NOTE — ED Provider Notes (Signed)
[facility] Emergency Department Provider Note ____________________________________________   First MD Initiated Contact with Patient 03/21/19 1145     (approximate)  I have reviewed the triage vital signs and the nursing notes.   HISTORY  Chief Complaint Generalized Body Aches; Fever; and Cough    HPI PRATT BRESS is a 53 y.o. male with PMH as noted below who presents with body aches over the last 2 days, associated with generalized malaise and weakness, as well as with nonproductive cough, shortness of breath, headache, and chills.  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  Past Medical History:  Diagnosis Date  . Allergy    Seasonal  . Anxiety   . Benign enlargement of prostate   . Depression   . Elevated BP   . Elevated transaminase level   . Failure of erection   . Fatigue   . Frequent headaches   . GERD (gastroesophageal reflux disease)   . Hypogonadism in male   . Kidney stones    History of kidney stones  . Migraine   . Sleep apnea    Currently uses cpap  . Thoracic outlet syndrome    Left Arm    Patient Active Problem List   Diagnosis Date Noted  . Tongue pain 02/09/2019  . Acute pain of left knee 04/18/2018  . Cutaneous skin tags 04/18/2018  . Lower extremity edema 04/18/2018  . Thoracic outlet syndrome 02/19/2018  . Pain in limb 02/19/2018  . Rectal bleeding   . Sinus congestion 09/01/2017  . Muscle strain 09/01/2017  . Chest pain 03/21/2017  . Dyspnea on exertion 02/19/2017  . Language difficulty 02/19/2017  . BPH with obstruction/lower urinary tract symptoms 12/03/2015  . Medication refill 09/13/2015  . Erectile disorder, generalized, mild 07/26/2015  . Hypogonadism in male 05/24/2015  . Environmental allergies 04/13/2015  . Sleep apnea 04/13/2015  . Migraines 04/13/2015  . Neck pain 04/13/2015  .  Adjustment disorder with mixed anxiety and depressed mood 04/13/2015  . Psoriasis 04/13/2015  . History of kidney stones 04/13/2015  . Neuropathy 04/13/2015  . Benign fibroma of prostate 03/24/2015  . Hypertension 03/24/2015  . Elevation of level of transaminase or lactic acid dehydrogenase (LDH) 03/24/2015  . Fatigue 03/24/2015    Past Surgical History:  Procedure Laterality Date  . COLONOSCOPY WITH PROPOFOL N/A 11/11/2017   Procedure: COLONOSCOPY WITH PROPOFOL;  Surgeon: Lin Landsman, MD;  Location: [facility];  Service: Endoscopy;  Laterality: N/A;  . KNEE ARTHROSCOPY WITH MEDIAL MENISECTOMY Left 07/26/2018   Procedure: KNEE ARTHROSCOPY WITH PARTIAL MEDIAL MENISECTOMY;  Surgeon: Leim Fabry, MD;  Location: [facility];  Service: Orthopedics;  Laterality: Left;  . Left Shoulder Surgery  2011  . nerve block     2 in neck. 5 in back.  . NOSE SURGERY    . POLYPECTOMY  11/11/2017   Procedure: POLYPECTOMY;  Surgeon: Lin Landsman, MD;  Location: [facility];  Service: Endoscopy;;  . SCALENE NODE BIOPSY / EXCISION    . VASECTOMY      Prior to Admission medications   Medication Sig Start Date End Date Taking? Authorizing Provider  albuterol (PROVENTIL HFA;VENTOLIN HFA) 108 (90 Base) MCG/ACT inhaler Inhale 2 puffs into the lungs every 6 (six) hours as needed for shortness of breath. 03/21/19   Arta Silence, MD  amLODipine (NORVASC) 10 MG tablet  TAKE 1 TABLET BY MOUTH DAILY. 09/13/18   [name], MD  azithromycin (ZITHROMAX Z-PAK) 250 MG tablet Take 2 tablets (500 mg) on  Day 1,  followed by 1 tablet (250 mg) once daily on Days 2 through 5. 03/21/19 03/26/19  Arta Silence, MD  calcipotriene (DOVONOX) 0.005 % cream Apply 1 application topically daily as needed (for psoriasis).    [provider]  loratadine (CLARITIN) 10 MG tablet Take 1 tablet (10 mg total) by mouth daily for 7 days. 12/17/18 12/24/18  Darlin Priestly, PA-C  Multiple  Vitamin (MULTIVITAMIN) tablet Take 1 tablet by mouth daily.    [provider]  omeprazole (PRILOSEC) 20 MG capsule TAKE 1 CAPSULE BY MOUTH DAILY 10/25/18   [name], MD  sertraline (ZOLOFT) 100 MG tablet TAKE 1 TABLET BY MOUTH ONCE DAILY. WILL NEED OFFICE VISIT FOR MORE REFILLS. 03/04/19   [name], MD  testosterone (ANDROGEL) 50 MG/5GM (1%) GEL Place 5 g onto the skin daily. 03/14/19   Stoioff, Ronda Fairly, MD    Allergies Erythromycin; Cephalexin; Dexamethasone; Oxycodone-acetaminophen; Prednisone; Gabapentin; and Ibuprofen  Family History  Problem Relation Age of Onset  . Hypertension Mother        Living  . Hearing loss Mother   . Heart disease Other   . Healthy Father        Living  . Diabetes Brother   . Stroke Paternal Uncle   . Heart attack Paternal Uncle   . Hearing loss Maternal Grandmother   . Healthy Son   . Healthy Daughter   . Kidney cancer Neg Hx   . Bladder Cancer Neg Hx   . Prostate cancer Neg Hx     Social History Social History   Tobacco Use  . Smoking status: Never Smoker  . Smokeless tobacco: Never Used  Substance Use Topics  . Alcohol use: Yes    Alcohol/week: 3.0 standard drinks    Types: 3 Cans of beer per week  . Drug use: No    Review of Systems  Constitutional: No fever.  Positive for malaise. Eyes: No visual changes. ENT: No sore throat. Cardiovascular: Positive for chest pain. Respiratory: Positive for mild shortness of breath. Gastrointestinal: No vomiting or diarrhea.  Genitourinary: Negative for flank pain. Musculoskeletal: Negative for back pain. Skin: Negative for rash. Neurological: Positive for headache..   ____________________________________________   PHYSICAL EXAM:  VITAL SIGNS: ED Triage Vitals  Enc Vitals Group     BP 03/21/19 1055 134/90     Pulse Rate 03/21/19 1055 84     Resp 03/21/19 1055 18     Temp 03/21/19 1055 98.2 F (36.8 C)     Temp Source 03/21/19 1055 Oral     SpO2  03/21/19 1055 100 %     Weight 03/21/19 1048 (!) 640 lb 14 oz (290.7 kg)     Height 03/21/19 1048 6\' 2"  (1.88 m)     Head Circumference --      Peak Flow --      Pain Score --      Pain Loc --      Pain Edu? --      Excl. in [place]? --     Constitutional: Alert and oriented.  Relatively well appearing and in no acute distress. Eyes: Conjunctivae are normal.  Head: Atraumatic. Nose: No congestion/rhinnorhea. Mouth/Throat: Mucous membranes are moist.   Neck: Normal range of motion.  Cardiovascular: Normal rate, regular rhythm. Grossly normal heart sounds.  Good  peripheral circulation. Respiratory: Normal respiratory effort.  No retractions. Lungs CTAB. Gastrointestinal: No distention.  Musculoskeletal: Extremities warm and well perfused.  Neurologic:  Normal speech and language. No gross focal neurologic deficits are appreciated.  Skin:  Skin is warm and dry. No rash noted. Psychiatric: Mood and affect are normal. Speech and behavior are normal.  ____________________________________________   LABS (all labs ordered are listed, but only abnormal results are displayed)  Labs Reviewed  BASIC METABOLIC PANEL - Abnormal; Notable for the following components:      Result Value   Glucose, Bld 105 (*)    All other components within normal limits  CBC WITH DIFFERENTIAL/PLATELET - Abnormal; Notable for the following components:   Hemoglobin 12.4 (*)    HCT 38.8 (*)    All other components within normal limits  TROPONIN I  INFLUENZA PANEL BY PCR (TYPE A & B)   ____________________________________________  EKG  ED ECG REPORT I, Arta Silence, the attending physician, personally viewed and interpreted this ECG.  Date: 03/21/2019 EKG Time: 1302 Rate: 59 Rhythm: normal sinus rhythm QRS Axis: normal Intervals: normal ST/T Wave abnormalities: normal Narrative Interpretation: no evidence of acute ischemia  ____________________________________________  RADIOLOGY  CXR:  Bibasilar reticular opacities  ____________________________________________   PROCEDURES  Procedure(s) performed: No  Procedures  Critical Care performed: No ____________________________________________   INITIAL IMPRESSION / ASSESSMENT AND PLAN / ED COURSE  Pertinent labs & imaging results that were available during my care of the patient were reviewed by me and considered in my medical decision making (see chart for details).  53 year old male with PMH as noted above presents with body aches, chest pain, malaise, weakness, and some cough over the last few days.  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  He has no other risk factors for COVID-19 exposure.  On exam he is relatively well-appearing.  His vital signs are normal and his O2 saturation is 100% on room air.  The remainder of the exam is as described above.  Differential includes viral syndrome, influenza, acute bronchitis, pneumonia, or possibly COVID-19.  However, he does not meet COVID-19 testing criteria at this time.  We will obtain chest x-ray, basic labs, influenza swab, and reassess.  ----------------------------------------- 1:44 PM on 03/21/2019 -----------------------------------------  Chest x-ray shows findings consistent with possible atypical pneumonia.  This also does not rule out COVID-19.  I counseled the patient on the results of the work-up.  At this time he is stable for discharge home.  He agrees with this plan and feels well to go home.  I have given him strict self-isolation instructions.  However, given that he very well may have an atypical bacterial pneumonia I have also prescribed azithromycin and an inhaler.  Strict return precautions given, and he expressed understanding.  ____  [name] was evaluated in Emergency Department on 03/21/2019 for the symptoms described in the history of present illness. He was  evaluated in the context of the global COVID-19 pandemic, which necessitated consideration that the patient might be at risk for infection with the SARS-CoV-2 virus that causes COVID-19. Institutional protocols and algorithms that pertain to the evaluation of patients at risk for COVID-19 are in a state of rapid change based on information released by regulatory bodies including the CDC and federal and state organizations. These policies and algorithms were followed during the patient's care in the ED.  ____________________________________________   FINAL  CLINICAL IMPRESSION(S) / ED DIAGNOSES  Final diagnoses:  Atypical pneumonia      NEW MEDICATIONS STARTED DURING THIS VISIT:  New Prescriptions   ALBUTEROL (PROVENTIL HFA;VENTOLIN HFA) 108 (90 BASE) MCG/ACT INHALER    Inhale 2 puffs into the lungs every 6 (six) hours as needed for shortness of breath.   AZITHROMYCIN (ZITHROMAX Z-PAK) 250 MG TABLET    Take 2 tablets (500 mg) on  Day 1,  followed by 1 tablet (250 mg) once daily on Days 2 through 5.     Note:  This document was prepared using Dragon voice recognition software and may include unintentional dictation errors.    Arta Silence, MD 03/21/19 1345

## 2019-03-21 NOTE — ED Notes (Signed)
Pt ambulatory to toilet with steady gait noted.  

## 2019-03-21 NOTE — ED Notes (Signed)
MD at bedside. Given cup of water.

## 2019-03-21 NOTE — Telephone Encounter (Signed)
Called pt twice and left a VM to call back CRM created and sent to [facility] pool.  

## 2019-03-21 NOTE — Telephone Encounter (Signed)
Received call from pt.  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  Pt said that his voice was raspy, has chest pain, chills, muscle aches, runny nose, sore throat, headache, some abdominal pain and shortness of breath.  No diarrhea and no vomiting.  Pt said that he is not sure if he has a fever and did not take his tempature when he reported to work this morning.  Pt said that he was just told to leave and to contact his doctor to see about COVID testing and could not return to work w/o a doctor's note.  Pt denies any travel or being around anyone who has traveled.  Pt denies being around anyone who has been diagnosed w/ COVID that he is aware of.  Pt stated that he was in and out of quarantine and negative rooms over the weekend at work. Pt stated that he was wearing his PPE while in rooms.  Pt also stated that he has seasonal allergies and was being treated for muscle pain and sob breath from walking up and down steps by PCP.  Therefore, pt said that it is hard to determine whether his symptoms are COVID related or his usual allergies.  Advised pt to contact [employer] since that is the protocol for employees.  Gave pt number for [facility].  Informed pt that notes will be forwarded to PCP for advisement as well.  Best call back number for pt is [phone].

## 2019-03-21 NOTE — ED Notes (Signed)
EDP at bedside  

## 2019-03-21 NOTE — ED Notes (Signed)
E-signature pad broken in room. Pt attempted to sign twice without pad taking signature.

## 2019-03-21 NOTE — Telephone Encounter (Signed)
He needs to follow the quarantine directions given his reported symptoms. I can not give him a letter to go back to work with the symptoms that he described. We are not doing COVID testing in the outpatient setting and he would need to go to the ED for evaluation given reported chest pain and shortness of breath.

## 2019-03-21 NOTE — Telephone Encounter (Signed)
Pt called back stating that [employer] instructed him to go to the ER since he was having chest pains.  Pt stated that he refused to go to ER since he knows that the chest pain is not related to a heart attack and he refuses to pay a $500 bill.  Pt said that [facility] [employer] also told him that he would need to get a prescription from his doctor in order to test for [place].  Pt said that he was waiting to receive instructions on what to do in the parking lot at work before he left.  Pt was put on hold for a moment while consulting with PCP's CMA.  Pt's call was disconnected when returned to speak w/ pt.  Will forward updated notes to PCP for advisement.

## 2019-03-21 NOTE — Telephone Encounter (Signed)
With the symptoms he is describing I would advise an ED evaluation as we are not testing for COVID19 in the office and there is not an order for me to give for him to be tested. The only way to get tested is to present to the ED and then they will triage him to determine if he needs to be tested. If he declines going to the ED he needs to self isolate at home for 14 days given that his symptoms could be related to [place]

## 2019-03-21 NOTE — ED Triage Notes (Signed)
Presents with body aches   Unsure fever and cough  States he worked all weekend

## 2019-03-21 NOTE — Telephone Encounter (Signed)
Malachy Mood called back to say that patient is being cared for in a place where he can not call out at this time. But she did return the call for him. Any questions please call Malachy Mood at Ph# [phone]

## 2019-03-21 NOTE — ED Notes (Signed)
Pt states he's been in and out of r/o covid pts rooms all weekend. States his chest was hurting all weekend, but didn't know if it was just from stress, thirty min ago began feeling weak, headache, coughing and slight shob. NAD at this time, oriented to room and call bell.

## 2019-03-21 NOTE — Discharge Instructions (Signed)
Your chest x-ray showing a possible pneumonia.  We have prescribed an antibiotic.  You can use the albuterol inhaler as needed every 4-6 hours for shortness of breath or chest tightness.  At this time you do not meet testing criteria for COVID-19.  However it is possible that you have it.  Therefore you should self isolate as if you do have it.  Instructions are provided below.  Return to the ER for new or worsening shortness of breath, weakness, high fevers, or any other new or worsening symptoms that concern you.

## 2019-03-21 NOTE — Telephone Encounter (Signed)
Sent to PCP to advise what pt needs to do next

## 2019-03-21 NOTE — Telephone Encounter (Signed)
Called the number listed to speak with Daniel Calderon and was unable to leave a VM due to mailbox being full.

## 2019-03-22 ENCOUNTER — Telehealth: Payer: Self-pay | Admitting: Neurology

## 2019-03-22 NOTE — Telephone Encounter (Signed)
Called and spoke with pt. Pt did go to the ED and he was Dx with pneumonia and NOT COVID-19 asked pt if he needed anything from [name] at this time pt stated NO. I advised pt to let us know if he does and to make sure he is getting plenty of rest.   Sent to PCP would you like for me to later reach out to pt for hospital f/u for Webex or phone visit?

## 2019-03-22 NOTE — Telephone Encounter (Signed)
LVM to schedule virtual visit for 4/6 appointment, requested patient call us back.

## 2019-03-22 NOTE — Telephone Encounter (Signed)
It would be nice to have him follow-up through a WebEx visit.  Please ensure that he is following the self-isolation instructions are given to him by the ED physician.

## 2019-03-22 NOTE — Telephone Encounter (Signed)
Pt returned Shelby's VM. Unavailable at the time. Please reach back out to pt.

## 2019-03-23 NOTE — Telephone Encounter (Signed)
Called pt again and left a VM to call back.

## 2019-03-23 NOTE — Telephone Encounter (Signed)
Called pt and left a VM to call back. CRM created and sent to [facility] pool. 

## 2019-03-23 NOTE — Telephone Encounter (Signed)
Patient calling and states that he is following isolation protocols. He would like to know when it is safe to come in contact with the other people in his household? Please advise. States that if he does not answer a detailed message could be left on his voicemail.

## 2019-03-23 NOTE — Telephone Encounter (Signed)
He needs to keep a 6 foot distance between him and his house members. His house members should also be following self isolation as they have been exposed to the patient.

## 2019-03-23 NOTE — Telephone Encounter (Signed)
Called to make sure the patient's chart was up to date and there were no issues or concerns.There was no answer. LVM for the patient to call back when he has time.

## 2019-03-23 NOTE — Telephone Encounter (Signed)
Sent to PCP to advise 

## 2019-03-24 ENCOUNTER — Encounter: Payer: Self-pay | Admitting: Neurology

## 2019-03-24 NOTE — Telephone Encounter (Signed)
Called and spoke with pt. Pt advised and stated that his wife actually has the pneumonia too and is currently being treated.

## 2019-03-24 NOTE — Telephone Encounter (Signed)
Patient returned call and I was able to review his chart with him and make sure everything was updated. Patient states he received my email, I resent it on the phone to [email or web address] pt confirmed that he has the email and app is downloaded and he will complete the questions ahead of time on the apt.

## 2019-03-28 ENCOUNTER — Other Ambulatory Visit: Payer: Self-pay

## 2019-03-28 ENCOUNTER — Encounter: Payer: Self-pay | Admitting: Neurology

## 2019-03-28 ENCOUNTER — Ambulatory Visit (INDEPENDENT_AMBULATORY_CARE_PROVIDER_SITE_OTHER): Payer: No Typology Code available for payment source | Admitting: Neurology

## 2019-03-28 DIAGNOSIS — G47411 Narcolepsy with cataplexy: Secondary | ICD-10-CM | POA: Diagnosis not present

## 2019-03-28 DIAGNOSIS — M2619 Other specified anomalies of jaw-cranial base relationship: Secondary | ICD-10-CM

## 2019-03-28 DIAGNOSIS — Z9989 Dependence on other enabling machines and devices: Secondary | ICD-10-CM

## 2019-03-28 DIAGNOSIS — G4719 Other hypersomnia: Secondary | ICD-10-CM

## 2019-03-28 DIAGNOSIS — G4733 Obstructive sleep apnea (adult) (pediatric): Secondary | ICD-10-CM

## 2019-03-28 DIAGNOSIS — R5383 Other fatigue: Secondary | ICD-10-CM

## 2019-03-28 DIAGNOSIS — G4710 Hypersomnia, unspecified: Secondary | ICD-10-CM

## 2019-03-28 DIAGNOSIS — F518 Other sleep disorders not due to a substance or known physiological condition: Secondary | ICD-10-CM

## 2019-03-28 NOTE — Progress Notes (Signed)
Virtual Visit via Video Note  I connected with [name] on 03/28/19 at  9:00 AM EDT by a video enabled telemedicine application and verified that I am speaking with the correct person using two identifiers.   I discussed the limitations of evaluation and management by telemedicine and the availability of in person appointments. The patient expressed understanding and agreed to proceed.  Larey Seat, MD   [facility]   Provider:  Larey Seat, MD  Primary Care Physician:  [name], MD   Referring Provider: [name], MD      HPI:  Daniel Calderon is a 53 y.o. male  Patient , seen here on Video link upon a referral from Dr. Caryl Bis for an evaluation of EDS/ excessive daytime sleepiness.     Chief complaint according to patient : Mr. Bellin reported that by early February 2020 he had developed tongue swelling and a feeling of burning blisters at the edge of his tongue.  He had not noted throat swelling or lip swelling and there was no rash or thrush everywhere.  In the same visit he stated that he had increasingly experienced more fatigue and daytime sleepiness, his glossitis was undetermined and cause, he had a slightly fissured tongue but the trip could not be would have had this for many years.  He is also going to see the dentist.  His primary care physician also ordered vitamin S17 levels on folic acid levels and asked for a referral to ENT.   The patient talks to me today because he feels that his sleep disorder is longstanding - worsening and progressing now. He mentions that he was diagnosed with narcolepsy in the past, I do not have any access to records previous sleep studies, CSF or blood test for narcolepsy.  Sleep habits are as follows: The patient is work at [employer] usually at 4 PM and he will be home by about 5 and dinnertime is between 530 and 6.  His bedtime is early around 9 PM and he describes  his bedroom as cool, quiet and dark he prefers to sleep on his back as he uses a CPAP and uses one pillow for head support.  Every night he wakes up feeling clammy and diaphoretic sometimes following a vivid dream.  He states that in his dreams he tries to fight sometimes he cannot remember what he dreamt but his wife has told him that he acted out.  He does not have palpitations when he is sweaty and clammy which is unusual.  He also the bathroom twice at night and he rises at 6 AM but feels neither refreshed no restored.  He does not have headaches in the morning there is no nausea or dizziness but throughout his adult life he has felt more more fatigued and sleepy.  Interestingly when he takes a nap the nap will last an hour or longer he seems not to be able to reduce his naps to power nap duration of 15 to 30 minutes.  And he feels that the power naps are not more refreshing.   Sleep medical history: He has been a sleepy individual for almost 40 years, OSA diagnosed in 2000, now using CPAP compliantly for over 20 years- still he wakes up tired he has started testosterone supplements and these have not helped.  he also just overcame bilateral pneumonia early this year and he still feels excessively fatigued may be because of the viral illness.  He reports that he sometimes experiences cataplectic attacks with emotions and he endorsed the Epworth sleepiness score at 17 out of 24 points today.  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  His paternal uncles would joke that he would fall asleep during a golf game.  Hypersomnia however seems not to have affected other family members.   Social history: The patient has been a lifelong non-smoker and does not use tobacco of any kind he drinks caffeine in form of coffee in the morning and soda or tea when  eating out sometimes, alcohol 1 or 2 glasses on Friday and Saturday nights not during the week.  The patient is married with 2 adult or young adult daughters in college, his younger daughter is currently at home during the corona crisis.  He is of [place] ancestry,  Review of Systems: Out of a complete 14 system review, the patient complains of only the following symptoms, and all other reviewed systems are negative. Excessive fatigue, excessive daytime sleepiness with an Epworth of 17 out of 24 points, he is constantly fighting the irresistible urge to fall asleep, he also reported recently tongue blisters and tongue swelling as noted above, also tightness in his calves when walking or climbing stairs his hips and thighs also feels very tight but he had his blood flow checked and there was no circulation issue.  He endorsed cataplexy he endorsed some vivid dreams but rarely acts those out.  Testosterone deficiency.  How likely are you to doze in the following situations: 0 = not likely, 1 = slight chance, 2 = moderate chance, 3 = high chance  Sitting and Reading? Watching Television? Sitting inactive in a public place (theater or meeting)? Lying down in the afternoon when circumstances permit? Sitting and talking to someone? Sitting quietly after lunch without alcohol? In a car, while stopped for a few minutes in traffic? As a passenger in a car for an hour without a break?  Total = 17/ 24      Social History   Socioeconomic History  . Marital status: Married    Spouse name: Not on file  . Number of children: Not on file  . Years of education: Not on file  . Highest education level: Not on file  Occupational History  . Not on file  Social Needs  . Financial resource strain: Not on file  . Food insecurity:    Worry: Not on file    Inability: Not on file  . Transportation needs:    Medical: Not on file    Non-medical: Not on file  Tobacco Use  . Smoking status:  Never Smoker  . Smokeless tobacco: Never Used  Substance and Sexual Activity  . Alcohol use: Yes    Alcohol/week: 3.0 standard drinks    Types: 3 Cans of beer per week  . Drug use: No  . Sexual activity: Yes    Partners: Female    Comment: Wife  Lifestyle  . Physical activity:    Days per week: Not on file    Minutes per session: Not on file  . Stress: Not on file  Relationships  . Social connections:    Talks on phone: Not on file    Gets together: Not on file    Attends religious service: Not on file  Active member of club or organization: Not on file    Attends meetings of clubs or organizations: Not on file    Relationship status: Not on file  . Intimate partner violence:    Fear of current or ex partner: Not on file    Emotionally abused: Not on file    Physically abused: Not on file    Forced sexual activity: Not on file  Other Topics Concern  . Not on file  Social History Narrative   [employer]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    Lives with wife and daughter (75)   High school and tech school   Caffeine- 2-3 coffee    Enjoys- yard work, Location manager- 2 dogs     Family History  Problem Relation Age of Onset  . Hypertension Mother        Living  . Hearing loss Mother   . Heart disease Other   . Healthy Father        Living  . Diabetes Brother   . Stroke Paternal Uncle   . Heart attack Paternal Uncle   . Hearing loss Maternal Grandmother   . Healthy Son   . Healthy Daughter   . Kidney cancer Neg Hx   . Bladder Cancer Neg Hx   . Prostate cancer Neg Hx     Past Medical History:  Diagnosis Date  . Allergy    Seasonal  . Anxiety   . Benign enlargement of prostate   . Depression   . Elevated BP   . Elevated transaminase level   . Failure of erection   . Fatigue   . Frequent headaches   . GERD (gastroesophageal reflux disease)   . Hypogonadism in male   . Kidney stones    History of kidney stones  . Migraine   . Sleep apnea    Currently uses cpap  .  Thoracic outlet syndrome    Left Arm    Past Surgical History:  Procedure Laterality Date  . COLONOSCOPY WITH PROPOFOL N/A 11/11/2017   Procedure: COLONOSCOPY WITH PROPOFOL;  Surgeon: Lin Landsman, MD;  Location: [place];  Service: Endoscopy;  Laterality: N/A;  . KNEE ARTHROSCOPY WITH MEDIAL MENISECTOMY Left 07/26/2018   Procedure: KNEE ARTHROSCOPY WITH PARTIAL MEDIAL MENISECTOMY;  Surgeon: Leim Fabry, MD;  Location: [facility];  Service: Orthopedics;  Laterality: Left;  . Left Shoulder Surgery  2011  . nerve block     2 in neck. 5 in back.  . NOSE SURGERY    . POLYPECTOMY  11/11/2017   Procedure: POLYPECTOMY;  Surgeon: Lin Landsman, MD;  Location: [place];  Service: Endoscopy;;  . SCALENE NODE BIOPSY / EXCISION    . VASECTOMY      Current Outpatient Medications  Medication Sig Dispense Refill  . albuterol (PROVENTIL HFA;VENTOLIN HFA) 108 (90 Base) MCG/ACT inhaler Inhale 2 puffs into the lungs every 6 (six) hours as needed for shortness of breath. 1 Inhaler 0  . amLODipine (NORVASC) 10 MG tablet TAKE 1 TABLET BY MOUTH DAILY. 90 tablet 2  . calcipotriene (DOVONOX) 0.005 % cream Apply 1 application topically daily as needed (for psoriasis).    Marland Kitchen loratadine (CLARITIN) 10 MG tablet Take 1 tablet (10 mg total) by mouth daily for 7 days. 7 tablet 0  . Multiple Vitamin (MULTIVITAMIN) tablet Take 1 tablet by mouth daily.    Marland Kitchen omeprazole (PRILOSEC) 20 MG capsule TAKE 1 CAPSULE BY MOUTH DAILY 90 capsule 3  . sertraline (  ZOLOFT) 100 MG tablet TAKE 1 TABLET BY MOUTH ONCE DAILY. WILL NEED OFFICE VISIT FOR MORE REFILLS. 30 tablet 0  . testosterone (ANDROGEL) 50 MG/5GM (1%) GEL Place 5 g onto the skin daily. 30 Tube 0   No current facility-administered medications for this visit.     Allergies as of 03/28/2019 - Review Complete 03/21/2019  Allergen Reaction Noted  . Erythromycin Nausea And Vomiting 03/24/2015  . Cephalexin Other (See Comments) 05/07/2015  .  Dexamethasone Other (See Comments) 05/07/2015  . Oxycodone-acetaminophen Other (See Comments) 05/16/2016  . Prednisone Other (See Comments) 05/07/2015  . Gabapentin Other (See Comments) 06/26/2015  . Ibuprofen Itching and Other (See Comments) 03/24/2015    Vitals: There were no vitals taken for this visit. Last Weight:  Wt Readings from Last 1 Encounters:  03/21/19 200 lb (90.7 kg)   UXN:ATFTD is no height or weight on file to calculate BMI.     Last Height:   Ht Readings from Last 1 Encounters:  03/21/19 6' 2"  (1.88 m)    Physical exam:  General: The patient is awake, alert and appears not in acute distress. The patient is well groomed. Head: Normocephalic, atraumatic. Neck is supple. Mallampati 4. neck circumference:19" . Nasal airflow demonstrated as patent,  Retrognathia is seen.  Trunk: BMI is 27. The patient's posture is erect.   Neurologic exam : The patient is awake and alert, oriented to place and time.   Memory subjective described as intact.   MOCA:No flowsheet data found. MMSE: MMSE - Mini Mental State Exam 05/01/2017  Orientation to time 5  Orientation to Place 5  Registration 3  Attention/ Calculation 4  Recall 2  Language- name 2 objects 2  Language- repeat 1  Language- follow 3 step command 3  Language- read & follow direction 1  Write a sentence 1  Copy design 1  Total score 28    Attention span & concentration ability appears normal.  Speech is fluent,  without  dysarthria, dysphonia or aphasia.  Mood and affect are appropriate.  Cranial nerves: Pupils are equal.  Extraocular movements  in vertical and horizontal planes intact and without nystagmus. Visual fields by finger perimetry are intact. Facial motor strength is symmetric and tongue and uvula move midline. Shoulder shrug was symmetrical.  Motor exam:  tone, muscle bulk and strength were symmetric in upper extremities, deferred demonsrations  In the lower extremities. Rapid alternating  movements in the fingers/hands was normal. Gait and station: Patient walks without assistive device   Assessment:  After physical and neurologic examination, review of laboratory studies,  Personal review of imaging studies, reports of other /same  Imaging studies, results of polysomnography and / or neurophysiology testing and pre-existing records as far as provided in visit., my assessment is:  1) Mr. Loughner carries the diagnosis of obstructive sleep apnea for almost 20 years and is using CPAP compliantly but needs new supplies.  In addition he has for many years now felt unrefreshed and unrestored after using CPAP.    He mentioned that a diagnosis of narcolepsy was made but I do not find any objective testing documented anywhere.  I think it is time to look at his baseline apnea again currently this can only be done in a home sleep test.  In addition I will invite him to [facility] and he can have his blood drawn for an HLA narcolepsy test, a genetic marker.    I reviewed his medications I do not think there is any medication  listed here that would give him this excessive degree of daytime sleepiness.  He checked out normal for vitamin B12 levels, a vitamin D level was not taken.    Once a home sleep test is read I can hopefully supply him with CPAP supplies, his current CPAP is only 53-1/51 years old and hopefully it is auto titration capable.  [facility] [place].  Once we have adjusted his CPAP I like to have more night off a sleep test on CPAP as pulse oximetry may suffice.   PS :  If his Epworth score remains at high as currently in spite of good control of his apnea/ or hypoxemia,l  I would definitely follow-up with a narcolepsy work-up which would include a PSG study followed by an M SLT in daytime.   This cannot be done by home study set up.   The patient was advised of the nature of the diagnosed disorder , the treatment options and the  risks for general  health and wellness arising from not treating the condition.   I spent more than 31 minutes of face to face time with the patient.  Greater than 50% of time was spent in counseling and coordination of care. We have discussed the diagnosis and differential and I answered the patient's questions.    Plan:  Treatment plan and additional workup :   Orders Placed This Encounter  Procedures  . NARCOLEPSY EVALUATION  . Home sleep test   RV face to face after 60-90 days on CPAP.   Take 2000 units of Vit D daily. OTC.   Will consider MSLT then.  Also check for anxiety/ depression related fatigue. Zoloft will need to be weaned before an MSLT can be validly performed.    Larey Seat, MD 05/27/8158, 4:70 PM  Certified in Neurology by ABPN Certified in [place] by [facility] [address], [place], [place] [address]

## 2019-03-28 NOTE — Patient Instructions (Signed)
Hypersomnia Hypersomnia is a condition in which a person feels very tired during the day even though he or she gets plenty of sleep at night. A person with this condition may take naps during the day and may find it very difficult to wake up from sleep. Hypersomnia may affect a person's ability to think, concentrate, drive, or remember things. What are the causes? The cause of this condition may not be known. Possible causes include:  Certain medicines.  Sleep disorders, such as narcolepsy and sleep apnea.  Injury to the head, brain, or spinal cord.  Drug or alcohol use.  Gastroesophageal reflux disease (GERD).  Tumors.  Certain medical conditions, such as depression, diabetes, or an underactive thyroid gland (hypothyroidism). What are the signs or symptoms? The main symptoms of hypersomnia include:  Feeling very tired throughout the day, regardless of how much sleep you got the night before.  Having trouble waking up. Others may find it difficult to wake you up when you are sleeping.  Sleeping for longer and longer periods at a time.  Taking naps throughout the day. Other symptoms may include:  Feeling restless, anxious, or annoyed.  Lacking energy.  Having trouble with: ? Remembering. ? Speaking. ? Thinking.  Loss of appetite.  Seeing, hearing, tasting, smelling, or feeling things that are not real (hallucinations). How is this diagnosed? This condition may be diagnosed based on:  Your symptoms and medical history.  Your sleeping habits. Your health care provider may ask you to write down your sleeping habits in a daily sleep log, along with any symptoms you have.  A series of tests that are done while you sleep (sleep study or polysomnogram).  A test that measures how quickly you can fall asleep during the day (daytime nap study or multiple sleep latency test). How is this treated? Treatment can help you manage your condition. Treatment may include:   Following a regular sleep routine.  Lifestyle changes, such as changing your eating habits, getting regular exercise, and avoiding alcohol or caffeinated beverages.  Taking medicines to make you more alert (stimulants) during the day.  Treating any underlying medical causes of hypersomnia. Follow these instructions at home: Sleep routine   Schedule the same bedtime and wake-up time each day.  Practice a relaxing bedtime routine. This may include reading, meditation, deep breathing, or taking a warm bath before going to sleep.  Get regular exercise each day. Avoid strenuous exercise in the evening hours.  Keep your sleep environment at a cooler temperature, darkened, and quiet.  Sleep with pillows and a mattress that are comfortable and supportive.  Schedule short 20-minute naps for when you feel sleepiest during the day.  Talk with your employer or teachers about your hypersomnia. If possible, adjust your schedule so that: ? You have a regular daytime work schedule. ? You can take a scheduled nap during the day. ? You do not have to work or be active at night.  Do not eat a heavy meal for a few hours before bedtime. Eat your meals at about the same times every day.  Avoid drinking alcohol or caffeinated beverages. Safety   Do not drive or use heavy machinery if you are sleepy. Ask your health care provider if it is safe for you to drive.  Wear a life jacket when swimming or spending time near water. General instructions  Take supplements and over-the-counter and prescription medicines only as told by your health care provider.  Keep a sleep log that will help  your doctor manage your condition. This may include information about: ? What time you go to bed each night. ? How often you wake up at night. ? How many hours you sleep at night. ? How often and for how long you nap during the day. ? Any observations from others, such as leg movements during sleep, sleep walking,  or snoring.  Keep all follow-up visits as told by your health care provider. This is important. Contact a health care provider if:  You have new symptoms.  Your symptoms get worse. Get help right away if:  You have serious thoughts about hurting yourself or someone else. If you ever feel like you may hurt yourself or others, or have thoughts about taking your own life, get help right away. You can go to your nearest emergency department or call:  Your local emergency services (911 in the [place]  A suicide crisis helpline, such as the [facility] at 727-597-2421. This is open 24 hours a day. Summary  Hypersomnia refers to a condition in which you feel very tired during the day even though you get plenty of sleep at night.  A person with this condition may take naps during the day and may find it very difficult to wake up from sleep.  Hypersomnia may affect a person's ability to think, concentrate, drive, or remember things.  Treatment, such as following a regular sleep routine and making some lifestyle changes, can help you manage your condition. This information is not intended to replace advice given to you by your health care provider.  12/10/2017 Elsevier Interactive Patient Education  2019 [place] mentioned that you may have a condition called narcolepsy: This would mean:, 1) you have a sleep disorder that manifests with at times severe excessive sleepiness during the day and often with problems with sleep at night.t you for a genetic marker associated with the disorder( HLA test     Some patients with narcolepsy report episodes of weakness, such as jaw or facial weakness, legs giving out, feeling wobbly or like "Jell-o", etc. in situations of anxiety, stress, laughter, sudden sadness, surprise, etc., which is called cataplexy.  You can also experience episodes of sleep paralysis during which you may feel unable to move upon awakening.  Some  people experience dreamlike sequences upon awakening or upon drifting off to sleep, called hypnopompic or hypnagogic hallucinations.

## 2019-03-29 ENCOUNTER — Other Ambulatory Visit
Admission: RE | Admit: 2019-03-29 | Discharge: 2019-03-29 | Disposition: A | Discharge status: Home / Self Care | Payer: No Typology Code available for payment source | Attending: Neurology | Admitting: Neurology

## 2019-03-29 DIAGNOSIS — Z9989 Dependence on other enabling machines and devices: Secondary | ICD-10-CM | POA: Diagnosis not present

## 2019-03-29 DIAGNOSIS — G47411 Narcolepsy with cataplexy: Secondary | ICD-10-CM | POA: Diagnosis not present

## 2019-03-29 DIAGNOSIS — R5383 Other fatigue: Secondary | ICD-10-CM | POA: Insufficient documentation

## 2019-03-29 DIAGNOSIS — G4719 Other hypersomnia: Secondary | ICD-10-CM | POA: Diagnosis not present

## 2019-03-29 DIAGNOSIS — G4733 Obstructive sleep apnea (adult) (pediatric): Secondary | ICD-10-CM | POA: Insufficient documentation

## 2019-03-30 ENCOUNTER — Telehealth: Payer: Self-pay

## 2019-03-30 NOTE — Telephone Encounter (Signed)
Attempted call to reschedule 04/14/2019 face to face visit with Dr. Saunders Revel to a telephone or video call due to current clinic policies related to [place] [facility] precautions. Left voicemail message requesting for patient to call back to discuss.

## 2019-04-04 ENCOUNTER — Other Ambulatory Visit: Payer: Self-pay | Admitting: Family Medicine

## 2019-04-04 ENCOUNTER — Telehealth: Payer: Self-pay | Admitting: *Deleted

## 2019-04-04 NOTE — Telephone Encounter (Signed)
I spoke with pt today concerning upcoming appointment. Pt refused Evisit at this time and mentioned that won't be necessary until he is seen by his urologist and other providers concerning his current health issues. He is willing to wait until further notice at this time.

## 2019-04-05 ENCOUNTER — Encounter: Payer: Self-pay | Admitting: Neurology

## 2019-04-05 ENCOUNTER — Telehealth: Payer: Self-pay | Admitting: Neurology

## 2019-04-05 LAB — MISC LABCORP TEST (SEND OUT): Labcorp test code: 167139

## 2019-04-05 NOTE — Telephone Encounter (Signed)
Pt called wanting to know the update on his lab results. Please advise.

## 2019-04-05 NOTE — Telephone Encounter (Signed)
As of this moment the lab work has not come back. Its still pending. We will notify one the lab has posted and Dr Brett Fairy has reviewed it.

## 2019-04-15 ENCOUNTER — Ambulatory Visit: Payer: Self-pay | Admitting: Internal Medicine

## 2019-04-20 ENCOUNTER — Other Ambulatory Visit: Payer: Self-pay

## 2019-04-20 DIAGNOSIS — E291 Testicular hypofunction: Secondary | ICD-10-CM

## 2019-04-20 DIAGNOSIS — R7989 Other specified abnormal findings of blood chemistry: Secondary | ICD-10-CM

## 2019-04-20 MED ORDER — TESTOSTERONE 50 MG/5GM (1%) TD GEL: 5.0000 g | Freq: Every day | TRANSDERMAL | 0 refills | Status: DC

## 2019-04-20 NOTE — Telephone Encounter (Signed)
Looks like pt is due for labs

## 2019-04-20 NOTE — Telephone Encounter (Signed)
He is past due for a testosterone level and hematocrit.  I will send in 1 more month refill however cannot refill any more until he gets these labs drawn.  [facility] pharmacy [facility] so why does not he get his blood drawn when he picks up the Rx?

## 2019-04-21 NOTE — Telephone Encounter (Signed)
Pt informed of the need to have labs drawn for further refills.

## 2019-04-28 ENCOUNTER — Ambulatory Visit (INDEPENDENT_AMBULATORY_CARE_PROVIDER_SITE_OTHER): Payer: No Typology Code available for payment source | Admitting: Neurology

## 2019-04-28 ENCOUNTER — Other Ambulatory Visit: Payer: Self-pay

## 2019-04-28 ENCOUNTER — Ambulatory Visit: Payer: Self-pay | Admitting: Family Medicine

## 2019-04-28 ENCOUNTER — Telehealth: Payer: Self-pay | Admitting: Lab

## 2019-04-28 ENCOUNTER — Ambulatory Visit: Payer: No Typology Code available for payment source | Admitting: Family Medicine

## 2019-04-28 DIAGNOSIS — R5383 Other fatigue: Secondary | ICD-10-CM

## 2019-04-28 DIAGNOSIS — G4733 Obstructive sleep apnea (adult) (pediatric): Secondary | ICD-10-CM | POA: Diagnosis not present

## 2019-04-28 DIAGNOSIS — G4719 Other hypersomnia: Secondary | ICD-10-CM

## 2019-04-28 DIAGNOSIS — G4731 Primary central sleep apnea: Secondary | ICD-10-CM

## 2019-04-28 DIAGNOSIS — G47411 Narcolepsy with cataplexy: Secondary | ICD-10-CM

## 2019-04-28 DIAGNOSIS — Z9989 Dependence on other enabling machines and devices: Secondary | ICD-10-CM

## 2019-04-28 NOTE — Telephone Encounter (Signed)
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 Advised patient really needs to evaluated in ER if symptoms worsen or becomes more SOB. Patient refused again ER visit, advised again needs to call [email or web address]  immediately for their recommendations I would schedule virtual with NO next available 1 PM , but is imperative he call health at work.

## 2019-04-28 NOTE — Telephone Encounter (Signed)
Noted. I understand his frustration but it is our policy at this time that we are not seeing anyone in person in clinic for any kind of respiratory complaint. We do not have the appropriate protective equipment or the ability to test for COVID19 in the office and thus are not seeing respiratory complaints in person. I will respond to his mychart message once he sends this.

## 2019-04-28 NOTE — Telephone Encounter (Signed)
Tried to call patient to make sure he did not have some kind of technical difficulty no answer left message to call office if we can help in anyway, we could reschedule ETC... Cannot confirm call to [facility] [employer] this would be over stepping bounds.

## 2019-04-28 NOTE — Telephone Encounter (Signed)
Called Pt 3x's No answer, Left a Voicemail for  Pt to call office, stating that he has a  1:00pm Virtual visit and he needs to call the office.

## 2019-04-28 NOTE — Telephone Encounter (Signed)
Ok thank you 

## 2019-04-28 NOTE — Telephone Encounter (Signed)
Sharee Pimple attempted to call patient and connect with him via [place] 3 times for our 1 PM appointment, no answer.  Juliann Pulse -- I want to be sure he did call health at work like he said he did, but I am not sure if that is [name] over-stepping.  Also should we charge no show fee?

## 2019-04-28 NOTE — Telephone Encounter (Signed)
Patient called health at work they took him out of work is all they offered, patient still refused ER evaluation wanted CXR ordered did not want to do the virtual with NP advised him we cannot bring him to office with those symptoms for CXR, that he would have to do a virtual visit for NP to order CXR and see if could be performed at hospital. Patient stated he would be keeping the virtual visit, but " I will be sending Dr. Caryl Bis a note to let him know he is through with [name] due to not bringing him into the office."  Advised patient of PCP statement and patient still refused ER evaluation.

## 2019-04-28 NOTE — Telephone Encounter (Signed)
I agree that the patient should be seen in the ED and that he should contact [email or web address] . We can not see him in clinic for those complaints, though he could do a virtual visit but the only way to get a CXR is to go to the ED.

## 2019-04-28 NOTE — Telephone Encounter (Signed)
Pt called Daniel Calderon Pt called in while sitting in his car at work.   (Works [employer] in [employer]) c/o chest pain and shortness of breath.   He had these same symptoms 4 weeks ago when he was diagnosed with bacterial pneumonia.   He is requesting to see Dr. Caryl Bis and get a chest x-ray if possible.  See triage notes.  PCP triage done instead of sending to ED since these symptoms are same as he had 4 weeks ago.  I warm transferred his call to [facility] in Dr. Ellen Henri office for further disposition or virtual visit scheduling (COVID-19 pandemic in progress)

## 2019-04-28 NOTE — Telephone Encounter (Signed)
Pt called in while sitting in his car at work.   (Works [employer] in [employer]) c/o chest pain and shortness of breath.   He had these same symptoms 4 weeks ago when he was diagnosed with bacterial pneumonia.   He is requesting to see Dr. Caryl Bis and get a chest x-ray if possible.  See triage notes.  PCP triage done instead of sending to ED since these symptoms are same as he had 4 weeks ago.  I warm transferred his call to [facility] in Dr. Ellen Henri office for further disposition or virtual visit scheduling (COVID-19 pandemic in progress)   I sent my triage notes to the office.   Reason for Disposition . Patient sounds very sick or weak to the triager    Had bacterial pneumonia 4 weeks ago.   Having same symptoms again.  Shortness of breath and chest pain  Answer Assessment - Initial Assessment Questions 1. RESPIRATORY STATUS: "Describe your breathing?" (e.g., wheezing, shortness of breath, unable to speak, severe coughing)      I had bacterial pneumonia 4 wks ago    My voice is raspy and in the middle of my chest it feels like someone is poking it. I'm having shortness of breath.   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   I don't know if it's my chest muscles. 2. ONSET: "When did this breathing problem begin?"      3 days ago 3. PATTERN "Does the difficult breathing come and go, or has it been constant since it started?"      It's there 90% of the time. 4. SEVERITY: "How bad is your breathing?" (e.g., mild, moderate, severe)    - MILD: No SOB at rest, mild SOB with walking, speaks normally in sentences, can lay down, no retractions, pulse < 100.    - MODERATE: SOB at rest, SOB with minimal exertion and prefers to sit, cannot lie down flat, speaks in phrases, mild retractions, audible wheezing, pulse 100-120.    - SEVERE: Very SOB at rest, speaks in single words, struggling to breathe, sitting hunched forward, retractions, pulse > 120      I'm sitting  in the car now.   I'm breathing heavy and my chest hurts. 5. RECURRENT SYMPTOM: "Have you had difficulty breathing before?" If so, ask: "When was the last time?" and "What happened that time?"      Yes   4 weeks ago with bacterial pneumonia.    I need a chest x-ray 6. CARDIAC HISTORY: "Do you have any history of heart disease?" (e.g., heart attack, angina, bypass surgery, angioplasty)      *No Answer* 7. LUNG HISTORY: "Do you have any history of lung disease?"  (e.g., pulmonary embolus, asthma, emphysema)     *No Answer* 8. CAUSE: "What do you think is causing the breathing problem?"      *No Answer* 9. OTHER SYMPTOMS: "Do you have any other symptoms? (e.g., dizziness, runny nose, cough, chest pain, fever)     *No Answer* 10. PREGNANCY: "Is there any chance you are pregnant?" "When was your last menstrual period?"       *No Answer* 11. TRAVEL: "Have you traveled out of the country in the last month?" (e.g., travel history, exposures)       *No Answer*  Protocols used: BREATHING DIFFICULTY-A-AH

## 2019-05-06 ENCOUNTER — Ambulatory Visit (INDEPENDENT_AMBULATORY_CARE_PROVIDER_SITE_OTHER): Payer: No Typology Code available for payment source | Admitting: Family Medicine

## 2019-05-06 ENCOUNTER — Telehealth: Payer: Self-pay | Admitting: Family Medicine

## 2019-05-06 ENCOUNTER — Encounter: Payer: Self-pay | Admitting: Family Medicine

## 2019-05-06 ENCOUNTER — Other Ambulatory Visit: Payer: Self-pay | Admitting: Family Medicine

## 2019-05-06 ENCOUNTER — Other Ambulatory Visit: Payer: Self-pay

## 2019-05-06 DIAGNOSIS — R0609 Other forms of dyspnea: Secondary | ICD-10-CM | POA: Diagnosis not present

## 2019-05-06 DIAGNOSIS — R5383 Other fatigue: Secondary | ICD-10-CM

## 2019-05-06 DIAGNOSIS — F4323 Adjustment disorder with mixed anxiety and depressed mood: Secondary | ICD-10-CM

## 2019-05-06 NOTE — Progress Notes (Signed)
Virtual Visit via video note  This visit type was conducted due to national recommendations for restrictions regarding the COVID-19 pandemic (e.g. social distancing).  This format is felt to be most appropriate for this patient at this time.  All issues noted in this document were discussed and addressed.  No physical exam was performed (except for noted visual exam findings with Video Visits).   I connected with Daniel Calderon today at  4:00 PM EDT by a video enabled telemedicine application and verified that I am speaking with the correct person using two identifiers. Location patient: work Location provider: work Persons participating in the virtual visit: patient, provider  I discussed the limitations, risks, security and privacy concerns of performing an evaluation and management service by telephone and the availability of in person appointments. I also discussed with the patient that there may be a patient responsible charge related to this service. The patient expressed understanding and agreed to proceed.   Reason for visit: Follow-up  HPI: Fatigue: Patient notes he continues to have issues with this.  He notes he is well and tight in his muscles get tired.  He has seen neurology and reports that they have advised him that it may be an immune system issue.  He has had a lab evaluation through them.  He reports getting a B12 injection which was beneficial.  He does have lots of muscle cramps.  Lots of leg fatigue and arm fatigue as well.  He has had an extensive evaluation with cardiology and neurosurgery and vascular surgery for his symptoms.  There has been no cause identified.  Dyspnea: Patient was diagnosed and treated for pneumonia 4 weeks ago.  He finished his course of antibiotics and did use albuterol when he was being treated.  He notes last week he was sitting in his truck prior to going to work and developed chest pain, shortness of breath, and diaphoresis.  He was evaluated  at [facility] walk-in clinic.  He had a chest x-ray that did not have any definite acute cardiopulmonary disease.  He was covered with Levaquin for potential bacterial etiology.  His novel coronavirus test was negative.  He has had shortness of breath since having the pneumonia.  He notes at times he can walk a mile without any shortness of breath and then other times when he is exerting himself will be short of breath and at times he will just be sitting there and feels short of breath.  He has had no recurrent cough or fevers.  Anxiety and depression: Patient notes some anxiety though no depression.  He remains on Zoloft which has been beneficial.  ROS: See pertinent positives and negatives per HPI.  Past Medical History:  Diagnosis Date  . Allergy    Seasonal  . Anxiety   . Benign enlargement of prostate   . Depression   . Elevated BP   . Elevated transaminase level   . Failure of erection   . Fatigue   . Frequent headaches   . GERD (gastroesophageal reflux disease)   . Hypogonadism in male   . Kidney stones    History of kidney stones  . Migraine   . Sleep apnea    Currently uses cpap  . Thoracic outlet syndrome    Left Arm    Past Surgical History:  Procedure Laterality Date  . COLONOSCOPY WITH PROPOFOL N/A 11/11/2017   Procedure: COLONOSCOPY WITH PROPOFOL;  Surgeon: Lin Landsman, MD;  Location: [place];  Service: Endoscopy;  Laterality: N/A;  . KNEE ARTHROSCOPY WITH MEDIAL MENISECTOMY Left 07/26/2018   Procedure: KNEE ARTHROSCOPY WITH PARTIAL MEDIAL MENISECTOMY;  Surgeon: Leim Fabry, MD;  Location: [facility];  Service: Orthopedics;  Laterality: Left;  . Left Shoulder Surgery  2011  . nerve block     2 in neck. 5 in back.  . NOSE SURGERY    . POLYPECTOMY  11/11/2017   Procedure: POLYPECTOMY;  Surgeon: Lin Landsman, MD;  Location: [place];  Service: Endoscopy;;  . SCALENE NODE BIOPSY / EXCISION    . VASECTOMY      Family History   Problem Relation Age of Onset  . Hypertension Mother        Living  . Hearing loss Mother   . Heart disease Other   . Healthy Father        Living  . Diabetes Brother   . Stroke Paternal Uncle   . Heart attack Paternal Uncle   . Hearing loss Maternal Grandmother   . Healthy Son   . Healthy Daughter   . Kidney cancer Neg Hx   . Bladder Cancer Neg Hx   . Prostate cancer Neg Hx     SOCIAL HX: Non-smoker.   Current Outpatient Medications:  .  albuterol (PROVENTIL HFA;VENTOLIN HFA) 108 (90 Base) MCG/ACT inhaler, Inhale 2 puffs into the lungs every 6 (six) hours as needed for shortness of breath., Disp: 1 Inhaler, Rfl: 0 .  amLODipine (NORVASC) 10 MG tablet, TAKE 1 TABLET BY MOUTH DAILY., Disp: 90 tablet, Rfl: 2 .  calcipotriene (DOVONOX) 0.005 % cream, Apply 1 application topically daily as needed (for psoriasis)., Disp: , Rfl:  .  Multiple Vitamin (MULTIVITAMIN) tablet, Take 1 tablet by mouth daily., Disp: , Rfl:  .  omeprazole (PRILOSEC) 20 MG capsule, TAKE 1 CAPSULE BY MOUTH DAILY, Disp: 90 capsule, Rfl: 3 .  testosterone (ANDROGEL) 50 MG/5GM (1%) GEL, Place 5 g onto the skin daily., Disp: 30 Tube, Rfl: 0 .  loratadine (CLARITIN) 10 MG tablet, Take 1 tablet (10 mg total) by mouth daily for 7 days., Disp: 7 tablet, Rfl: 0 .  sertraline (ZOLOFT) 100 MG tablet, Take 1 tablet by mouth once daily., Disp: 90 tablet, Rfl: 1  EXAM:  VITALS per patient if applicable: None.  GENERAL: alert, oriented, appears well and in no acute distress  HEENT: atraumatic, conjunttiva clear, no obvious abnormalities on inspection of external nose and ears  NECK: normal movements of the head and neck  LUNGS: on inspection no signs of respiratory distress, breathing rate appears normal, no obvious gross SOB, gasping or wheezing  CV: no obvious cyanosis  MS: moves all visible extremities without noticeable abnormality  PSYCH/NEURO: pleasant and cooperative, no obvious depression or anxiety, speech  and thought processing grossly intact  ASSESSMENT AND PLAN:  Discussed the following assessment and plan:  Adjustment disorder with mixed anxiety and depressed mood  Dyspnea on exertion - Plan: Ambulatory referral to Pulmonology  Other fatigue  Adjustment disorder with mixed anxiety and depressed mood Stable. Continue zoloft.   Dyspnea on exertion This issue has been persistent since having PNA. He had a negative follow-up evaluation with negative COVID19 testing and a reassuring CXR. Undetermined cause at this time.  Potentially could be related to his muscular fatigue.  Prior cardiac evaluation unrevealing. I think he needs to complete evaluation through neurology.  He will need to see pulmonology as well.  Referral will be placed.  Fatigue This is a chronic  ongoing issue.  He continues to have issues with this.  I have reviewed the available notes in care everywhere from his neurologist.  I agree that he needs to complete evaluation through them and he will likely need a muscle biopsy.  CMA will contact patient to get him scheduled for follow-up in 3 months.   I discussed the assessment and treatment plan with the patient. The patient was provided an opportunity to ask questions and all were answered. The patient agreed with the plan and demonstrated an understanding of the instructions.   The patient was advised to call back or seek an in-person evaluation if the symptoms worsen or if the condition fails to improve as anticipated.   Tommi Rumps, MD

## 2019-05-06 NOTE — Telephone Encounter (Signed)
PT has a medication refill on

## 2019-05-10 ENCOUNTER — Telehealth: Payer: Self-pay | Admitting: Family Medicine

## 2019-05-10 ENCOUNTER — Encounter: Payer: Self-pay | Admitting: Neurology

## 2019-05-10 NOTE — Procedures (Signed)
Patient Information     First Name: Daniel Last Name: Calderon ID: [id number]  Birth Date: [birth year] Age: 53 Gender: Male  Referring Provider: [name], MD BMI: 25.7 (W=200 lb, H=6' 2'')  Neck Circ.:  19" Epworth:  17/24   Sleep Study Information    Study Date: Apr 28, 2019 S/H/A Version: [id number] / 4.1.1528 / 76   History: Video link visit for EDS Daniel Calderon reported that by early February 2020 he had developed tongue swelling and a feeling of burning blisters at the edge of his tongue.  He had not noted throat swelling or lip swelling and there was no rash or thrush anywhere.  In the same visit he stated that he had increasingly experienced more fatigue and daytime sleepiness.The patient has used CPAP for over 20 years -  his glossitis was undetermined as to its cause.  His primary care physician also ordered vitamin W58 levels on folic acid levels and asked for a referral to ENT aside from a new sleep evaluation.    Summary & Diagnosis:    Severe SA (Sleep Apnea) at an AHI of 68.4/h and with exacerbation during NREM sleep. This pattern of non REM accentuation speaks for a central or complex apnea.  Heart rate varied from 47 bpm to 113bpm.  Frequent, brief hypoxic episodes were noted throughout the study.   Recommendations:      This Severity and complicated type of sleep apnea needs an attended Sleep study and possible PAP titration to determine if central apnea is present and to make sure that CPAP is not influencing this type of sleep apnea negatively.   I ordered an attended Split night protocol.  Electronically Signed: Larey Seat, MD   05-09-2019             Sleep Summary  Oxygen Saturation Statistics   Start Study Time: End Study Time: Total Recording Time:    9:34:45 PM   7:18:09 AM 9 h, 43 min  Total Sleep Time % REM of Sleep Time:  8 h, 32 min 15.4    Mean: 95 Minimum: 84 Maximum: 100  Mean of Desaturations Nadirs (%):   91  Oxygen  Desaturation %:   4-9 10-20 >20 Total  Events Number Total   375  34 91.7 8.3  0 0.0  409 100.0  Oxygen Saturation: <90 <=88 <85 <80 <70  Duration (minutes): Sleep % 9.4 1.8  4.9 0.1  1.0 0.0 0.0 0.0 0.0 0.0     Respiratory Indices      Total Events REM NREM All Night  pRDI:  584  pAHI:  577 ODI:  409  pAHIc: 278  % CSR: 0.0 26.0 24.5 18.4 13.8 77.1 76.4 54.0 36.6 69.2 68.4 48.5 33.2       Pulse Rate Statistics during Sleep (BPM)      Mean: 67 Minimum: 47 Maximum: 113    Indices are calculated using technically valid sleep time of  8 hrs, 26 min. Central-Indices are calculated using technically valid sleep time of  8  hrs, 21 min. pRDI/pAHI are calculated using oxi desaturations ? 3%  Body Position Statistics  Position Supine Prone Right Left Non-Supine  Sleep (min) 168.5 98.0 190.0 55.9 343.9  Sleep % 32.9 19.1 37.1 10.9 67.1  pRDI 93.8 78.1 34.7 96.9 57.2  pAHI 93.8 76.3 33.5 96.9 55.9  ODI 79.0 50.8 16.3 62.0 33.5     Snoring Statistics Snoring Level (dB) >40 >50 >60 >70 >80 >Threshold (  45)  Sleep (min) 125.4 17.2 6.4 1.4 0.0 29.8  Sleep % 24.5 3.4 1.2 0.3 0.0 5.8    Mean: 41 dB Sleep Stages Chart                                                                             pAHI=68.4                                                                                              Mild              Moderate                    Severe                                                 5              15                    [id number]

## 2019-05-10 NOTE — Assessment & Plan Note (Signed)
Stable.  Continue zoloft.  

## 2019-05-10 NOTE — Addendum Note (Signed)
Addended by: Larey Seat on: 05/10/2019 05:19 PM   Modules accepted: Orders

## 2019-05-10 NOTE — Telephone Encounter (Signed)
Please call the patient and get him scheduled for follow-up in 3 months.  Please also let him know that I reviewed the available information from his neurologist.  I agree with them that he does need to complete the immune evaluation.  It looks like they have placed the referral for this.  It appears that they may have discussed a nerve conduction study as well.  I do think that the patient may need a muscle biopsy though it may be worthwhile having him complete these other evaluations prior to considering that.

## 2019-05-10 NOTE — Assessment & Plan Note (Signed)
This is a chronic ongoing issue.  He continues to have issues with this.  I have reviewed the available notes in care everywhere from his neurologist.  I agree that he needs to complete evaluation through them and he will likely need a muscle biopsy.

## 2019-05-10 NOTE — Telephone Encounter (Signed)
Called and spoke with pt. Pt scheduled for 3 month follow up. Pt advised and voiced understanding.

## 2019-05-10 NOTE — Assessment & Plan Note (Addendum)
This issue has been persistent since having PNA. He had a negative follow-up evaluation with negative COVID19 testing and a reassuring CXR. Undetermined cause at this time.  Potentially could be related to his muscular fatigue.  Prior cardiac evaluation unrevealing. I think he needs to complete evaluation through neurology.  He will need to see pulmonology as well.  Referral will be placed.

## 2019-05-11 ENCOUNTER — Telehealth: Payer: Self-pay | Admitting: Neurology

## 2019-05-11 NOTE — Telephone Encounter (Signed)
-----   Message from Larey Seat, MD sent at 05/10/2019  5:19 PM EDT ----- Patient with know history of OSA, now presenting with excessive daytime sleepiness. Previous PSGs were not accessible.   1)Severe SA (Sleep Apnea) at an AHI of 68.4/h and with exacerbation during NREM sleep. This pattern of non REM accentuation speaks for a central or complex apnea. 2)Heart rate varied widely from 47 bpm to 113bpm.  3) Frequent, brief hypoxic episodes were noted throughout the study.   Recommendations:     This Severity and complicated type of sleep apnea needs an attended Sleep study and possible other modalities of PAP titration to determine if central apnea is present and to make sure that CPAP is not influencing this type of sleep apnea negatively.   I ordered an attended Split night protocol.  Electronically Signed: Larey Seat, MD   05-09-2019

## 2019-05-11 NOTE — Telephone Encounter (Signed)
Called the patient and made him aware that Dr Brett Fairy reviewed his sleep study and lab work. Advised that Dr Brett Fairy did note the patient to have severe sleep apnea. Reviewed the findings. Informed the patient that Dr Brett Fairy would like to move forward with completing the CPAP titration to make sure the patient doesn't have worsening of central apnea under CPAP treatment. Advised the patient that I will touch base to see when we plan to start completing CPAP titrations. Informed the patient either the sleep lab will contact him about getting scheduled for the cpap titration test or I will call him with back up plan. Patient does currently have a CPAP but unsure what the settings for the current machine is. I reviewed the narcolepsy gene negative result with the patient also and advised him that we would need to treat the severe apnea first before moving forward with narcolepsy workup. Pt verbalized understanding. Pt had no questions at this time but was encouraged to call back if questions arise.

## 2019-05-17 ENCOUNTER — Other Ambulatory Visit: Payer: Self-pay

## 2019-05-17 ENCOUNTER — Inpatient Hospital Stay: Payer: No Typology Code available for payment source | Attending: Internal Medicine | Admitting: Internal Medicine

## 2019-05-17 DIAGNOSIS — D801 Nonfamilial hypogammaglobulinemia: Secondary | ICD-10-CM | POA: Diagnosis not present

## 2019-05-17 DIAGNOSIS — G4733 Obstructive sleep apnea (adult) (pediatric): Secondary | ICD-10-CM

## 2019-05-17 NOTE — Progress Notes (Signed)
I connected with Daniel Calderon on 05/17/19 at  9:30 AM EDT by video enabled telemedicine visit and verified that I am speaking with the correct person using two identifiers.  I discussed the limitations, risks, security and privacy concerns of performing an evaluation and management service by telemedicine and the availability of in-person appointments. I also discussed with the patient that there may be a patient responsible charge related to this service. The patient expressed understanding and agreed to proceed.    Other persons participating in the visit and their role in the encounter: none  Patient's location: work  Provider's location: home    No history exists.     Chief Complaint: Hypogammaglobinemia   History of present illness:Daniel Calderon 53 y.o.  male with history of obstructive sleep apnea/on CPAP has been referred to [name] for further evaluation recommendations for hypogammaglobinemia.   Patient states that he was recently evaluated by neurology Dr. Manuella Ghazi for muscle weakness leg cramping.  Patient complains of intermittent leg cramping/muscle weakness worsening by exertion however also happens at rest.  Started about 3 years ago progressively getting worse.  He has had extensive work-up with Dr. Deboraha Sprang formal diagnosis made.  Patient also had a recent sleep study-and is recommended titration of his current CPAP machine.  Patient admits to history of pneumonia approximately 2 months ago.  He was treated with antibiotics on outpatient basis.  This was the first episode pneumonia that remembers.  He denies any history of frequent sinopulmonary or gastrointestinal or skin infections.   Patient denies any weight loss.  His appetite is good.  However complains of significant episodes of sweats/for the last 2 to 3 years.  He is on testosterone supplementation.  Also complains of fatigue for shortness of breath-for which he is awaiting a pulmonary  evaluation.  Observation/objective:  Assessment and plan: Hypogammaglobulinemia ([place]) #Incidental mild hypogammaglobinemia [[facility] chart-low IgG/IgA/IgM]-however this seems to be clinically insignificant at this time-as patient denies any frequent or unusual infection episodes..  I would recommend reevaluation of immunoglobulins about 6 months  # Leg cramping/sweating/ extreme fatigue-unclear etiology.  Defer to neurology for their expertise  # Severe OSA- 20 years; repeat recent sleep study; awaiting titration of CPAP machine.  Thank you Dr.Shah for allowing me to participate in the care of your pleasant patient. Please do not hesitate to contact me with questions or concerns in the interim.  # DISPOSITION:  # follow up in 6 months-MD; labs [cbc/camp/Quantitative immunoglobulins]- 1 week prior- Dr.B   Follow-up instructions:  I discussed the assessment and treatment plan with the patient.  The patient was provided an opportunity to ask questions and all were answered.  The patient agreed with the plan and demonstrated understanding of instructions.  The patient was advised to call back or seek an in person evaluation if the symptoms worsen or if the condition fails to improve as anticipated.  Dr. Charlaine Dalton [place] at [facility] 05/17/2019 11:37 AM

## 2019-05-17 NOTE — Assessment & Plan Note (Addendum)
#  Incidental mild hypogammaglobinemia [[facility] chart-low IgG/IgA/IgM]-however this seems to be clinically insignificant at this time-as patient denies any frequent or unusual infection episodes..  I would recommend reevaluation of immunoglobulins about 6 months  # Leg cramping/sweating/ extreme fatigue-unclear etiology.  Defer to neurology for their expertise  # Severe OSA- 20 years; repeat recent sleep study; awaiting titration of CPAP machine.  Thank you Dr.Shah for allowing me to participate in the care of your pleasant patient. Please do not hesitate to contact me with questions or concerns in the interim.  # DISPOSITION:  # follow up in 6 months-MD; labs [cbc/camp/Quantitative immunoglobulins]- 1 week prior- Dr.B

## 2019-05-18 ENCOUNTER — Encounter: Payer: Self-pay | Admitting: Internal Medicine

## 2019-05-18 ENCOUNTER — Ambulatory Visit (INDEPENDENT_AMBULATORY_CARE_PROVIDER_SITE_OTHER): Payer: No Typology Code available for payment source | Admitting: Internal Medicine

## 2019-05-18 ENCOUNTER — Other Ambulatory Visit
Admission: RE | Admit: 2019-05-18 | Discharge: 2019-05-18 | Disposition: A | Discharge status: Home / Self Care | Payer: No Typology Code available for payment source | Source: Ambulatory Visit | Attending: Internal Medicine | Admitting: Internal Medicine

## 2019-05-18 VITALS — BP 124/76 | HR 78 | Temp 98.1°F | Ht 74.0 in | Wt 212.4 lb

## 2019-05-18 DIAGNOSIS — R61 Generalized hyperhidrosis: Secondary | ICD-10-CM

## 2019-05-18 DIAGNOSIS — R0602 Shortness of breath: Secondary | ICD-10-CM

## 2019-05-18 NOTE — Patient Instructions (Signed)
We will get a lung function test to see if you have any evidence of lung disease.  We will also check a TB blood test.  The lung function test may be several weeks, because they are delayed from [place].  After you have had the lung function test performed please call us back for results.

## 2019-05-18 NOTE — Progress Notes (Addendum)
[facility] Consultation      Assessment and Plan:  Dyspnea on exertion. - Uncertain etiology, patient has had extensive negative tests including cardiology work-up, neurosurgical evaluation of lower extremity claudication.  He is a lifelong non-smoker, does not have any evidence of current or former diagnoses of lung disease, other than a recent diagnosis of acute bronchitis/pneumonia which has since resolved. - I will send the patient for a full pulmonary function test to look for evidence of lung disease.  Obstructive sleep apnea. - Currently managed by Dr. Brigitte Pulse, patient remains sleepy he is being evaluated for possible narcolepsy.  Night sweats. - We will check TB QuantiFERON.  Greater than 50% of the 45 minute visit was spent in counseling/coordination of care regarding dyspnea.   Date: 05/18/2019  MRN# [id number] JANET DECESARE 1966-10-01    [name] is a 53 y.o. old male seen in consultation for chief complaint of:    Chief Complaint  Patient presents with  . pulmonary consult    per Dr. Nancy Nordmann reports of sob with exertion & night sweats x3y    HPI:   The patient is a 53 year old male, seen due to dyspnea.  Patient sees neurology due to a history of obstructive sleep apnea and lower extremity pain and weakness.  He is followed by oncology for possible hypogammaglobulinemia.  He presented to the ED on 03/21/2019 with body aches, malaise, fever, cough and shortness of breath.  He was diagnosed with atypical pneumonia, influenza testing was negative at that time. He works here at [employer] in [employer]. He has been on CPAP for 15  Years or more, but he continues to feel tired, he is going through a narcolepsy eval by Dr. Manuella Ghazi, his neurologist. He has noted that he has had issues with leg cramping when walking up a flight of steps, he had dyspnea with walking short distances, but this is variable.  He notes that his breathing can occasionally  become short even when sitting still. He has drenching night sweats. He has a history of migraines, anxiety, thoracic outlet syndrome. He was seen by cardiology and through to have possible neurogenic LE claudication, stress test negative. He was seen by neurosurgery, underwent an MRI spine which showed no central stenosis. There was concern about a primary myopathy.  He is a lifelong non-smoker. He has never been diagnosed with respiratory problems. He has not been on any maintenance inhaler. He has no known tb exposures.   Addendum 06/01/2019, PFT **PFT 05/31/2019>> tracings personally reviewed.  FVC is 98% predicted, FEV1 is 1 4% addicted, ratio is 82%.  There is no significant change of bronchodilator therapy.  Residual volume is normal, ratio is within normal limits.  Diffusion capacity 74% flow volume loop is unremarkable.  Overall this pulmonary function test shows normal pulmonary functions without evidence of obstructive lung disease.  **SARS cov2 04/28/19>> negative.  **Desat walk 05/18/2019>> At rest on RA sat was 97% and HR 78. Pt walked 360 feet at brisk pace, sat was 97% and HR 93.  **Stress Myoview 12/13/18>> negative.  **Chest x-ray 03/21/2019>> imaging personally reviewed, bibasilar atelectasis/opacities. **echo 3/18>> EF 55% PMHX:   Past Medical History:  Diagnosis Date  . Allergy    Seasonal  . Anxiety   . Benign enlargement of prostate   . Depression   . Elevated BP   . Elevated transaminase level   . Failure of erection   . Fatigue   . Frequent headaches   .  GERD (gastroesophageal reflux disease)   . Hypogonadism in male   . Kidney stones    History of kidney stones  . Migraine   . Sleep apnea    Currently uses cpap  . Thoracic outlet syndrome    Left Arm   Surgical Hx:  Past Surgical History:  Procedure Laterality Date  . COLONOSCOPY WITH PROPOFOL N/A 11/11/2017   Procedure: COLONOSCOPY WITH PROPOFOL;  Surgeon: Lin Landsman, MD;  Location: [place];  Service: Endoscopy;  Laterality: N/A;  . KNEE ARTHROSCOPY WITH MEDIAL MENISECTOMY Left 07/26/2018   Procedure: KNEE ARTHROSCOPY WITH PARTIAL MEDIAL MENISECTOMY;  Surgeon: Leim Fabry, MD;  Location: [facility];  Service: Orthopedics;  Laterality: Left;  . Left Shoulder Surgery  2011  . nerve block     2 in neck. 5 in back.  . NOSE SURGERY    . POLYPECTOMY  11/11/2017   Procedure: POLYPECTOMY;  Surgeon: Lin Landsman, MD;  Location: [place];  Service: Endoscopy;;  . SCALENE NODE BIOPSY / EXCISION    . VASECTOMY     Family Hx:  Family History  Problem Relation Age of Onset  . Hypertension Mother        Living  . Hearing loss Mother   . Heart disease Other   . Healthy Father        Living  . Diabetes Brother   . Stroke Paternal Uncle   . Heart attack Paternal Uncle   . Hearing loss Maternal Grandmother   . Healthy Son   . Healthy Daughter   . Kidney cancer Neg Hx   . Bladder Cancer Neg Hx   . Prostate cancer Neg Hx    Social Hx:   Social History   Tobacco Use  . Smoking status: Never Smoker  . Smokeless tobacco: Never Used  Substance Use Topics  . Alcohol use: Yes    Alcohol/week: 3.0 standard drinks    Types: 3 Cans of beer per week  . Drug use: No   Medication:    Current Outpatient Medications:  .  amLODipine (NORVASC) 10 MG tablet, TAKE 1 TABLET BY MOUTH DAILY., Disp: 90 tablet, Rfl: 2 .  calcipotriene (DOVONOX) 0.005 % cream, Apply 1 application topically daily as needed (for psoriasis)., Disp: , Rfl:  .  Multiple Vitamin (MULTIVITAMIN) tablet, Take 1 tablet by mouth daily., Disp: , Rfl:  .  omeprazole (PRILOSEC) 20 MG capsule, TAKE 1 CAPSULE BY MOUTH DAILY, Disp: 90 capsule, Rfl: 3 .  sertraline (ZOLOFT) 100 MG tablet, Take 1 tablet by mouth once daily., Disp: 90 tablet, Rfl: 1 .  testosterone (ANDROGEL) 50 MG/5GM (1%) GEL, Place 5 g onto the skin daily., Disp: 30 Tube, Rfl: 0   Allergies:  Erythromycin; Cephalexin; Dexamethasone;  Oxycodone-acetaminophen; Prednisone; Gabapentin; and Ibuprofen  Review of Systems: Gen:  Denies  fever, sweats, chills HEENT: Denies blurred vision, double vision. bleeds, sore throat Cvc:  No dizziness, chest pain. Resp:   Denies cough or sputum production, shortness of breath Gi: Denies swallowing difficulty, stomach pain. Gu:  Denies bladder incontinence, burning urine Ext:   No Joint pain, stiffness. Skin: No skin rash,  hives  Endoc:  No polyuria, polydipsia. Psych: No depression, insomnia. Other:  All other systems were reviewed with the patient and were negative other that what is mentioned in the HPI.   Physical Examination:   VS: BP 124/76 (BP Location: Left Arm, Cuff Size: Normal)   Pulse 78   Temp 98.1 F (36.7  C) (Oral)   Ht 6\' 2"  (1.88 m)   Wt 212 lb 6.4 oz (96.3 kg)   SpO2 99%   BMI 27.27 kg/m   General Appearance: No distress  Neuro:without focal findings,  speech normal,  HEENT: PERRLA, EOM intact.   Pulmonary: normal breath sounds, No wheezing.  CardiovascularNormal S1,S2.  No m/r/g.   Abdomen: Benign, Soft, non-tender. Renal:  No costovertebral tenderness  GU:  No performed at this time. Endoc: No evident thyromegaly, no signs of acromegaly. Skin:   warm, no rashes, no ecchymosis  Extremities: normal, no cyanosis, clubbing.  Other findings:    LABORATORY PANEL:   CBC No results for input(s): WBC, HGB, HCT, PLT in the last 168 hours. ------------------------------------------------------------------------------------------------------------------  Chemistries  No results for input(s): NA, K, CL, CO2, GLUCOSE, BUN, CREATININE, CALCIUM, MG, AST, ALT, ALKPHOS, BILITOT in the last 168 hours.  Invalid input(s): GFRCGP ------------------------------------------------------------------------------------------------------------------  Cardiac Enzymes No results for input(s): TROPONINI in the last 168 hours.  ------------------------------------------------------------  RADIOLOGY:  No results found.     Thank  you for the consultation and for allowing [facility] to assist in the care of your patient. Our recommendations are noted above.  Please contact us if we can be of further service.   Marda Stalker, M.D., F.C.C.P.  Board Certified in Internal Medicine, Pulmonary Medicine, [place], and Sleep Medicine.  [facility] Office Number: [phone]   05/18/2019

## 2019-05-20 LAB — QUANTIFERON-TB GOLD PLUS (RQFGPL)
QuantiFERON Mitogen Value: >10 IU/mL
QuantiFERON Nil Value: 0.02 IU/mL
QuantiFERON TB1 Ag Value: 0.02 IU/mL
QuantiFERON TB2 Ag Value: 0.01 IU/mL

## 2019-05-20 LAB — QUANTIFERON-TB GOLD PLUS: QuantiFERON-TB Gold Plus: NEGATIVE

## 2019-05-26 ENCOUNTER — Other Ambulatory Visit: Payer: Self-pay | Admitting: Family Medicine

## 2019-05-26 DIAGNOSIS — R7989 Other specified abnormal findings of blood chemistry: Secondary | ICD-10-CM

## 2019-05-26 DIAGNOSIS — E291 Testicular hypofunction: Secondary | ICD-10-CM

## 2019-05-26 NOTE — Telephone Encounter (Signed)
Pharmacy sent fax requesting a refill on Testosterone. . You saw him in [place] and he was suppose to have labs drawn in 6 months with a follow up in 1 year. He has not had labs drawn yet. Please advise

## 2019-05-27 ENCOUNTER — Other Ambulatory Visit: Payer: Self-pay | Admitting: *Deleted

## 2019-05-27 ENCOUNTER — Other Ambulatory Visit: Payer: Self-pay

## 2019-05-27 ENCOUNTER — Other Ambulatory Visit
Admission: RE | Admit: 2019-05-27 | Discharge: 2019-05-27 | Disposition: A | Discharge status: Home / Self Care | Payer: No Typology Code available for payment source | Source: Ambulatory Visit | Attending: Internal Medicine | Admitting: Internal Medicine

## 2019-05-27 DIAGNOSIS — Z1159 Encounter for screening for other viral diseases: Secondary | ICD-10-CM | POA: Diagnosis not present

## 2019-05-27 DIAGNOSIS — Z01812 Encounter for preprocedural laboratory examination: Secondary | ICD-10-CM | POA: Insufficient documentation

## 2019-05-27 DIAGNOSIS — E349 Endocrine disorder, unspecified: Secondary | ICD-10-CM

## 2019-05-28 LAB — NOVEL CORONAVIRUS, NAA (HOSP ORDER, SEND-OUT TO REF LAB; TAT 18-24 HRS): SARS-CoV-2, NAA: NOT DETECTED

## 2019-05-30 ENCOUNTER — Other Ambulatory Visit: Payer: No Typology Code available for payment source

## 2019-05-30 ENCOUNTER — Other Ambulatory Visit: Payer: Self-pay

## 2019-05-30 DIAGNOSIS — E349 Endocrine disorder, unspecified: Secondary | ICD-10-CM

## 2019-05-31 ENCOUNTER — Ambulatory Visit: Payer: No Typology Code available for payment source | Attending: Internal Medicine

## 2019-05-31 ENCOUNTER — Ambulatory Visit: Payer: No Typology Code available for payment source

## 2019-05-31 DIAGNOSIS — R0602 Shortness of breath: Secondary | ICD-10-CM | POA: Diagnosis present

## 2019-05-31 DIAGNOSIS — R61 Generalized hyperhidrosis: Secondary | ICD-10-CM | POA: Diagnosis not present

## 2019-05-31 LAB — TESTOSTERONE: Testosterone: 165 ng/dL — ABNORMAL LOW (ref 264–916)

## 2019-05-31 NOTE — Telephone Encounter (Signed)
Pt came in for labwork on Monday 6/8, he is completely out of meds.  Can this be sent over now that he had labwork?

## 2019-05-31 NOTE — Telephone Encounter (Signed)
Ok to refill now?

## 2019-06-01 ENCOUNTER — Telehealth: Payer: Self-pay | Admitting: Internal Medicine

## 2019-06-01 NOTE — Telephone Encounter (Signed)
Pt called back. Advised pt on below notes, as Erasmo Downer was not available and he understood. Let him know that I would have Erasmo Downer call him back with further details.

## 2019-06-01 NOTE — Telephone Encounter (Signed)
LM for patient to call for covid results.

## 2019-06-01 NOTE — Telephone Encounter (Signed)
Noted, thank you

## 2019-06-01 NOTE — Telephone Encounter (Signed)
Pulmonary function test was completely normal. Covid negative. He can follow up as needed if he is still having respiratory issues.

## 2019-06-02 MED ORDER — TESTOSTERONE 50 MG/5GM (1%) TD GEL: 5.0000 g | Freq: Every day | TRANSDERMAL | 3 refills | Status: DC

## 2019-06-02 NOTE — Telephone Encounter (Signed)
Patient requests refill of testosterone. He is out of medication.

## 2019-06-02 NOTE — Telephone Encounter (Signed)
rx sent

## 2019-06-14 ENCOUNTER — Other Ambulatory Visit: Payer: Self-pay | Admitting: Family Medicine

## 2019-07-04 ENCOUNTER — Other Ambulatory Visit (HOSPITAL_COMMUNITY)
Admission: RE | Admit: 2019-07-04 | Discharge: 2019-07-04 | Disposition: A | Discharge status: Home / Self Care | Payer: No Typology Code available for payment source | Source: Ambulatory Visit | Attending: Neurology | Admitting: Neurology

## 2019-07-04 DIAGNOSIS — Z1159 Encounter for screening for other viral diseases: Secondary | ICD-10-CM | POA: Diagnosis not present

## 2019-07-05 LAB — SARS CORONAVIRUS 2 (TAT 6-24 HRS): SARS Coronavirus 2: NEGATIVE

## 2019-07-06 ENCOUNTER — Telehealth: Payer: Self-pay

## 2019-07-07 ENCOUNTER — Other Ambulatory Visit: Payer: Self-pay

## 2019-07-07 ENCOUNTER — Ambulatory Visit (INDEPENDENT_AMBULATORY_CARE_PROVIDER_SITE_OTHER): Payer: No Typology Code available for payment source | Admitting: Neurology

## 2019-07-07 DIAGNOSIS — R5383 Other fatigue: Secondary | ICD-10-CM

## 2019-07-07 DIAGNOSIS — G4731 Primary central sleep apnea: Secondary | ICD-10-CM

## 2019-07-07 DIAGNOSIS — G4739 Other sleep apnea: Secondary | ICD-10-CM

## 2019-07-07 DIAGNOSIS — G47411 Narcolepsy with cataplexy: Secondary | ICD-10-CM

## 2019-07-07 DIAGNOSIS — G4719 Other hypersomnia: Secondary | ICD-10-CM

## 2019-07-08 ENCOUNTER — Other Ambulatory Visit (HOSPITAL_COMMUNITY): Payer: Self-pay

## 2019-07-13 NOTE — Addendum Note (Signed)
Addended by: Larey Seat on: 07/13/2019 05:53 PM   Modules accepted: Orders

## 2019-07-13 NOTE — Procedures (Signed)
PATIENT'S NAME:  Daniel Calderon, Daniel Calderon DOB:      [birth year]      MR#:    [id number]     DATE OF RECORDING: 07/07/2019 REFERRING M.D.:  Tommi Rumps, MD Study Performed:   Titration to positive airway pressure  HISTORY:  [name] is a 53 year old patient who underwent a HST on 04-28-2019 with a result of severe sleep apnea, complex type. There were a high number of central apneas noted, the patient who had been using CPAP for over 20 years, had difficulties initiating sleep without PAP therapy.   He returns today for an attended PAP titration. Baseline AHI was 68.7/h without REM dependence. the heart rate varied from 47-113 bpm.  The patient endorsed the Epworth Sleepiness Scale at 17 points.   The patient's weight 200 pounds with a height of 74 (inches), resulting in a BMI of 25.7 kg/m2. The patient's neck circumference measured 19 inches.  CURRENT MEDICATIONS: Proventil, Norvasc, Dovonox, Claritin, Multivitamin, Prilosec, Zoloft, Androgel    PROCEDURE:  This is a multichannel digital polysomnogram utilizing the SomnoStar 11.2 system.  Electrodes and sensors were applied and monitored per AASM Specifications.   EEG, EOG, Chin and Limb EMG, were sampled at 200 Hz.  ECG, Snore and Nasal Pressure, Thermal Airflow, Respiratory Effort, CPAP Flow and Pressure, Oximetry was sampled at 50 Hz. Digital video and audio were recorded.       CPAP was initiated at 7 cmH20 with heated humidity per AASM split night standards and pressure was advanced to 8 cmH20 because of hypopneas, apneas and desaturations.  At a PAP pressure of 8 cmH20, there was a reduction of the AHI to 3.4/h with improvement of obstructive sleep apnea but with a number of central apneas remaining. The patient slept 160 minutes at 7 cm water and 227 minutes at 8 cm water pressure.   Lights Out was at 21:55 and Lights On at 05:01. Total recording time (TRT) was 426 minutes, with a total sleep time (TST) of 387.5 minutes. The patient's  sleep latency was 41.5 minutes. REM latency was 181.5 minutes.  The sleep efficiency was 91. %.    SLEEP ARCHITECTURE: WASO (Wake after sleep onset) was 24.5 minutes.  There were 18.5 minutes in Stage N1, 273 minutes Stage N2, 27.5 minutes Stage N3 and 68.5 minutes in Stage REM.  The percentage of Stage N1 was 4.8%, Stage N2 was 70.5%, Stage N3 was 7.1% and Stage R (REM sleep) was 17.7%.   RESPIRATORY ANALYSIS:  There was a total of 32 respiratory events: 3 obstructive apneas, 20 central apneas and 4 mixed apneas with a total of 27 apneas and an apnea index (AI) of 4.2 /hour. There were 5 hypopneas with a hypopnea index of 0.8/hour. The patient also had 0 respiratory event related arousals (RERAs).      The total APNEA/HYPOPNEA INDEX (AHI) was 5. /hour and the total RESPIRATORY DISTURBANCE INDEX was 5.0 /hour.  2 events occurred in REM sleep and 30 events in NREM. The REM AHI was 1.8 /hour versus a non-REM AHI of 5.6 /hour.  The patient spent 384.5 minutes of total sleep time in the supine position and 3 minutes in non-supine. The supine AHI was 4.8, versus a non-supine AHI of 20.0.  OXYGEN SATURATION & C02:  The baseline 02 saturation was 96%, with the lowest being 91%. Time spent below 89% saturation equaled 0 minutes.  The arousals were noted as: 43 were spontaneous, 14 were associated with PLMs, and only  3 were associated with respiratory events. The patient had a total of 249 Periodic Limb Movements. The Periodic Limb Movement (PLM) index was 38.6 and the PLM Arousal index was 2.2 /hour. Audio and video analysis did not show any abnormal or unusual movements, behaviors, phonations or vocalizations.  No bathroom breaks were taken. Snoring was controlled/EKG was in keeping with normal sinus rhythm (NSR).  Post-study, the patient indicated that sleep was improved over his usual and that he liked the new headgear.  The patient was fitted with a N30 nasal mask in medium by [employer].    DIAGNOSIS 1. Complex Sleep Apnea syndrome at a severe degree ( according to HST)  responded well to low pressure CPAP at 8 cm water.  2. No Sleep Related Hypoxemia 3. Mild Periodic Limb Movement Disorder.   PLANS/RECOMMENDATIONS:  Auto titration capable CPAP device with a pressure setting from 7 through 9 cm water with 2 cm EPR, heated humidity and N 30 medium interface. 1. PAP therapy compliance is defined as 4 hours or more of nightly use. 2. The patient should avoid evening sedatives, hypnotics, and alcoholic beverage consumption at bedtime.  A follow up appointment will be scheduled in the Sleep Clinic at [facility].   Please call [phone] with any questions.      I certify that I have reviewed the entire raw data recording prior to the issuance of this report in accordance with the Standards of Accreditation of the American Academy of Sleep Medicine (AASM)   Larey Seat, M.D.  07-13-2019  Diplomat, American Board of Psychiatry and Neurology  Diplomat, [place] [employer] Medical Director, [facility]

## 2019-07-18 ENCOUNTER — Encounter: Payer: Self-pay | Admitting: Neurology

## 2019-08-10 ENCOUNTER — Other Ambulatory Visit: Payer: Self-pay

## 2019-08-10 ENCOUNTER — Encounter: Payer: Self-pay | Admitting: Family Medicine

## 2019-08-10 ENCOUNTER — Ambulatory Visit (INDEPENDENT_AMBULATORY_CARE_PROVIDER_SITE_OTHER): Payer: No Typology Code available for payment source | Admitting: Family Medicine

## 2019-08-10 DIAGNOSIS — R079 Chest pain, unspecified: Secondary | ICD-10-CM

## 2019-08-10 DIAGNOSIS — R945 Abnormal results of liver function studies: Secondary | ICD-10-CM

## 2019-08-10 DIAGNOSIS — R5383 Other fatigue: Secondary | ICD-10-CM | POA: Diagnosis not present

## 2019-08-10 DIAGNOSIS — G4730 Sleep apnea, unspecified: Secondary | ICD-10-CM

## 2019-08-10 DIAGNOSIS — I10 Essential (primary) hypertension: Secondary | ICD-10-CM | POA: Diagnosis not present

## 2019-08-10 DIAGNOSIS — R7989 Other specified abnormal findings of blood chemistry: Secondary | ICD-10-CM

## 2019-08-10 MED ORDER — AMLODIPINE BESYLATE 10 MG PO TABS: 5.0000 mg | ORAL_TABLET | Freq: Every day | ORAL | 2 refills | Status: DC

## 2019-08-10 NOTE — Assessment & Plan Note (Signed)
Atypical chest pain.  It is reassuring that he had a negative stress test less than a year ago.  Suspect musculoskeletal strain versus costochondritis.  He will trial ibuprofen the next time this occurs.  If not improving he will let us know.

## 2019-08-10 NOTE — Assessment & Plan Note (Signed)
- 

## 2019-08-10 NOTE — Progress Notes (Signed)
Virtual Visit via telephone Note  This visit type was conducted due to national recommendations for restrictions regarding the COVID-19 pandemic (e.g. social distancing).  This format is felt to be most appropriate for this patient at this time.  All issues noted in this document were discussed and addressed.  No physical exam was performed (except for noted visual exam findings with Video Visits).   I connected with Daniel Calderon today at  1:45 PM EDT by  telephone and verified that I am speaking with the correct person using two identifiers. Location patient: work Location provider: work Persons participating in the virtual visit: patient, provider  I discussed the limitations, risks, security and privacy concerns of performing an evaluation and management service by telephone and the availability of in person appointments. I also discussed with the patient that there may be a patient responsible charge related to this service. The patient expressed understanding and agreed to proceed.  Interactive audio and video telecommunications were attempted between this provider and patient, however failed, due to patient having technical difficulties OR patient did not have access to video capability.  We continued and completed visit with audio only.   Reason for visit: follow-up  HPI: Hypertension: Patient notes his blood pressure has been good.  He decreased his amlodipine dose to 5 mg as he was having some sweats at night and notes the sweats have improved significantly with the decreased dose.  No dyspnea.  No exertional chest pain.  Musculoskeletal chest pain: Patient notes for the last several weeks he has had issues with discomfort just to the left of his sternum that occurs when he is at home sitting down at the end of the day.  He has none during the day when he is physically active at work.  It is not exertional.  There is no shortness of breath.  There is no radiation.  There is no  diaphoresis.  It feels like it is in his left pectoral muscle.  It lasts for 45 minutes to an hour and then resolves on its own.  He had a negative stress test in December 2019.  OSA: He did the CPAP study and was fitted with nasal pillows.  He has been sleeping better.  Chronic fatigue: Patient saw neurology for this.  He notes they gave him a trial of a B12 injection which helped significantly with his symptoms despite his normal B12 level.  He wonders if he continue on this.  Elevated LFTs: Previously evaluated by GI.  Needs to be rechecked.  ROS: See pertinent positives and negatives per HPI.  Past Medical History:  Diagnosis Date  . Allergy    Seasonal  . Anxiety   . Benign enlargement of prostate   . Depression   . Elevated BP   . Elevated transaminase level   . Failure of erection   . Fatigue   . Frequent headaches   . GERD (gastroesophageal reflux disease)   . Hypogonadism in male   . Kidney stones    History of kidney stones  . Migraine   . Sleep apnea    Currently uses cpap  . Thoracic outlet syndrome    Left Arm    Past Surgical History:  Procedure Laterality Date  . COLONOSCOPY WITH PROPOFOL N/A 11/11/2017   Procedure: COLONOSCOPY WITH PROPOFOL;  Surgeon: Lin Landsman, MD;  Location: [place] [facility];  Service: Endoscopy;  Laterality: N/A;  . KNEE ARTHROSCOPY WITH MEDIAL MENISECTOMY Left 07/26/2018   Procedure: KNEE  ARTHROSCOPY WITH PARTIAL MEDIAL MENISECTOMY;  Surgeon: Leim Fabry, MD;  Location: [facility];  Service: Orthopedics;  Laterality: Left;  . Left Shoulder Surgery  2011  . nerve block     2 in neck. 5 in back.  . NOSE SURGERY    . POLYPECTOMY  11/11/2017   Procedure: POLYPECTOMY;  Surgeon: Lin Landsman, MD;  Location: [place];  Service: Endoscopy;;  . SCALENE NODE BIOPSY / EXCISION    . VASECTOMY      Family History  Problem Relation Age of Onset  . Hypertension Mother        Living  . Hearing loss Mother   . Heart  disease Other   . Healthy Father        Living  . Diabetes Brother   . Stroke Paternal Uncle   . Heart attack Paternal Uncle   . Hearing loss Maternal Grandmother   . Healthy Son   . Healthy Daughter   . Kidney cancer Neg Hx   . Bladder Cancer Neg Hx   . Prostate cancer Neg Hx     SOCIAL HX: Non-smoker.   Current Outpatient Medications:  .  amLODipine (NORVASC) 10 MG tablet, Take 0.5 tablets (5 mg total) by mouth daily., Disp: 45 tablet, Rfl: 2 .  calcipotriene (DOVONOX) 0.005 % cream, Apply 1 application topically daily as needed (for psoriasis)., Disp: , Rfl:  .  Multiple Vitamin (MULTIVITAMIN) tablet, Take 1 tablet by mouth daily., Disp: , Rfl:  .  omeprazole (PRILOSEC) 20 MG capsule, TAKE 1 CAPSULE BY MOUTH DAILY, Disp: 90 capsule, Rfl: 3 .  sertraline (ZOLOFT) 100 MG tablet, Take 1 tablet by mouth once daily., Disp: 90 tablet, Rfl: 1 .  testosterone (ANDROGEL) 50 MG/5GM (1%) GEL, Place 5 g onto the skin daily., Disp: 30 Tube, Rfl: 3  EXAM: This was a telehealth telephone visit and thus no physical exam was completed.  ASSESSMENT AND PLAN:  Discussed the following assessment and plan:  Hypertension He will send [name] a copy of his readings.  He will continue amlodipine.  Chest pain Atypical chest pain.  It is reassuring that he had a negative stress test less than a year ago.  Suspect musculoskeletal strain versus costochondritis.  He will trial ibuprofen the next time this occurs.  If not improving he will let us know.  Sleep apnea Improved with nasal pillows.  He will continue CPAP.  Fatigue Chronic issue. Improved with B12 injection. I advised that he contact neurology to see if they wanted him to continue with this. No other obvious cause has been found on extensive evaluation.   Elevated LFTs Recheck with labs.   Labs ordered.  Patient will go to the medical mall to have these completed.   I discussed the assessment and treatment plan with the patient. The  patient was provided an opportunity to ask questions and all were answered. The patient agreed with the plan and demonstrated an understanding of the instructions.   The patient was advised to call back or seek an in-person evaluation if the symptoms worsen or if the condition fails to improve as anticipated.  I provided 18 minutes of non-face-to-face time during this encounter.   Tommi Rumps, MD

## 2019-08-10 NOTE — Assessment & Plan Note (Signed)
Chronic issue. Improved with B12 injection. I advised that he contact neurology to see if they wanted him to continue with this. No other obvious cause has been found on extensive evaluation.

## 2019-08-10 NOTE — Assessment & Plan Note (Signed)
He will send [name] a copy of his readings.  He will continue amlodipine.

## 2019-08-10 NOTE — Assessment & Plan Note (Signed)
Improved with nasal pillows.  He will continue CPAP.

## 2019-08-15 ENCOUNTER — Other Ambulatory Visit
Admission: RE | Admit: 2019-08-15 | Discharge: 2019-08-15 | Disposition: A | Discharge status: Home / Self Care | Payer: No Typology Code available for payment source | Source: Ambulatory Visit | Attending: Family Medicine | Admitting: Family Medicine

## 2019-08-15 ENCOUNTER — Other Ambulatory Visit: Payer: Self-pay

## 2019-08-15 ENCOUNTER — Ambulatory Visit (INDEPENDENT_AMBULATORY_CARE_PROVIDER_SITE_OTHER): Payer: No Typology Code available for payment source | Admitting: Family Medicine

## 2019-08-15 ENCOUNTER — Encounter: Payer: Self-pay | Admitting: Family Medicine

## 2019-08-15 VITALS — BP 120/70 | HR 76 | Temp 97.8°F | Ht 74.0 in | Wt 218.2 lb

## 2019-08-15 DIAGNOSIS — R10814 Left lower quadrant abdominal tenderness: Secondary | ICD-10-CM | POA: Diagnosis not present

## 2019-08-15 DIAGNOSIS — M545 Low back pain, unspecified: Secondary | ICD-10-CM

## 2019-08-15 DIAGNOSIS — R109 Unspecified abdominal pain: Secondary | ICD-10-CM | POA: Diagnosis not present

## 2019-08-15 DIAGNOSIS — I10 Essential (primary) hypertension: Secondary | ICD-10-CM | POA: Insufficient documentation

## 2019-08-15 LAB — POCT URINALYSIS DIPSTICK
Bilirubin, UA: NEGATIVE
Blood, UA: NEGATIVE
Glucose, UA: NEGATIVE
Ketones, UA: NEGATIVE
Leukocytes, UA: NEGATIVE
Nitrite, UA: NEGATIVE
Protein, UA: NEGATIVE
Spec Grav, UA: 1.01 (ref 1.010–1.025)
Urobilinogen, UA: 0.2 E.U./dL
pH, UA: 6 (ref 5.0–8.0)

## 2019-08-15 LAB — COMPREHENSIVE METABOLIC PANEL
ALT: 83 U/L — ABNORMAL HIGH (ref 0–44)
AST: 53 U/L — ABNORMAL HIGH (ref 15–41)
Albumin: 5 g/dL (ref 3.5–5.0)
Alkaline Phosphatase: 85 U/L (ref 38–126)
Anion gap: 9 (ref 5–15)
BUN: 14 mg/dL (ref 6–20)
CO2: 24 mmol/L (ref 22–32)
Calcium: 9.7 mg/dL (ref 8.9–10.3)
Chloride: 107 mmol/L (ref 98–111)
Creatinine, Ser: 1.11 mg/dL (ref 0.61–1.24)
GFR calc Af Amer: >60 mL/min (ref >60)
GFR calc non Af Amer: >60 mL/min (ref >60)
Glucose, Bld: 87 mg/dL (ref 70–99)
Potassium: 3.8 mmol/L (ref 3.5–5.1)
Sodium: 140 mmol/L (ref 135–145)
Total Bilirubin: 0.8 mg/dL (ref 0.3–1.2)
Total Protein: 7.1 g/dL (ref 6.5–8.1)

## 2019-08-15 LAB — CBC WITH DIFFERENTIAL/PLATELET
Abs Immature Granulocytes: 0.02 10*3/uL (ref 0.00–0.07)
Basophils Absolute: 0.1 10*3/uL (ref 0.0–0.1)
Basophils Relative: 1 %
Eosinophils Absolute: 0.4 10*3/uL (ref 0.0–0.5)
Eosinophils Relative: 5 %
HCT: 38.4 % — ABNORMAL LOW (ref 39.0–52.0)
Hemoglobin: 12.6 g/dL — ABNORMAL LOW (ref 13.0–17.0)
Immature Granulocytes: 0 %
Lymphocytes Relative: 21 %
Lymphs Abs: 1.5 10*3/uL (ref 0.7–4.0)
MCH: 26.1 pg (ref 26.0–34.0)
MCHC: 32.8 g/dL (ref 30.0–36.0)
MCV: 79.5 fL — ABNORMAL LOW (ref 80.0–100.0)
Monocytes Absolute: 0.6 10*3/uL (ref 0.1–1.0)
Monocytes Relative: 8 %
Neutro Abs: 4.5 10*3/uL (ref 1.7–7.7)
Neutrophils Relative %: 65 %
Platelets: 186 10*3/uL (ref 150–400)
RBC: 4.83 MIL/uL (ref 4.22–5.81)
RDW: 15.2 % (ref 11.5–15.5)
WBC: 7.1 10*3/uL (ref 4.0–10.5)
nRBC: 0 % (ref 0.0–0.2)

## 2019-08-15 LAB — HEMOGLOBIN A1C
Hgb A1c MFr Bld: 5.4 % (ref 4.8–5.6)
Mean Plasma Glucose: 108.28 mg/dL

## 2019-08-15 MED ORDER — HYDROCODONE-ACETAMINOPHEN 5-325 MG PO TABS: 1.0000 | ORAL_TABLET | Freq: Four times a day (QID) | ORAL | 0 refills | Status: DC | PRN

## 2019-08-15 MED ORDER — TAMSULOSIN HCL 0.4 MG PO CAPS: 0.4000 mg | ORAL_CAPSULE | Freq: Every day | ORAL | 0 refills | Status: DC

## 2019-08-15 NOTE — Assessment & Plan Note (Signed)
History is consistent with kidney stone. He does have some abdominal tenderness and I dicussed that that is not typical of a kidney stone. Discussed completing a CT abd/pelvis to evaluate for a cause given his tenderness, though the patient was hesitant to do that given that he has to go back to work. We will obtain labs to evaluate for signs of infection and contact the patient today if he needs to be evaluated with a CT scan or in the ED. He will take flomax for the potential stone. Vicodin for pain. Discussed risk of drowsiness with this and he will not take this and go to work.  He knows that if his pain is uncontrolled by the Vicodin he would need to go to the emergency department.  He will contact us tomorrow if his pain is not improving and he would be willing to do a CT scan at that time.  He is given reasons to seek medical attention in the emergency department.

## 2019-08-15 NOTE — Patient Instructions (Signed)
Nice to see you. You may have a kidney stone or potentially some other intra-abdominal cause of your pain.  We will have you complete lab work to determine if there are any signs of infection.  We will call you with the results. Please try the Flomax to help you pass a kidney stone.  You can take the hydrocodone for pain.  If this does not control your pain you need to go to the emergency department.  If your pain worsens or you develop fever or any other symptoms please go to the emergency department. If your pain has not started to improve by tomorrow please let us know and we can obtain a CT scan.

## 2019-08-15 NOTE — Progress Notes (Addendum)
Tommi Rumps, MD Phone: [phone]  GRANTLEY SAVAGE is a 53 y.o. male who presents today for same-day visit.  Left flank pain: Patient notes onset of left flank pain last night. Feels consistent with a kidney stone. No hematuria. Some radiation in to left abdomen. There is constant pain though also some paroxysmal pain. No fevers. No other abdominal pain. 2/10 now, though has gotten up to 5/10. Some nausea. No vomiting. Feels consistent with a kidney stone. Patient has a history of kidney stone.   Social History   Tobacco Use  Smoking Status Never Smoker  Smokeless Tobacco Never Used     ROS see history of present illness  Objective  Physical Exam Vitals:   08/15/19 1524  BP: 120/70  Pulse: 76  Temp: 97.8 F (36.6 C)  SpO2: 98%    BP Readings from Last 3 Encounters:  08/15/19 120/70  05/18/19 124/76  03/21/19 130/86   Wt Readings from Last 3 Encounters:  08/15/19 218 lb 3.2 oz (99 kg)  08/10/19 210 lb (95.3 kg)  05/18/19 212 lb 6.4 oz (96.3 kg)    Physical Exam Constitutional:      Appearance: He is not diaphoretic.     Comments: Patient is uncomfortable appearing and does have difficulty getting comfortable on exam  Cardiovascular:     Rate and Rhythm: Normal rate and regular rhythm.     Heart sounds: Normal heart sounds.  Pulmonary:     Effort: Pulmonary effort is normal.     Breath sounds: Normal breath sounds.  Abdominal:     General: Bowel sounds are normal. There is no distension.     Palpations: Abdomen is soft. There is no mass.     Tenderness: There is abdominal tenderness (Left lower quadrant, left midabdomen). There is no guarding or rebound.  Skin:    General: Skin is warm and dry.  Neurological:     Mental Status: He is alert.      Assessment/Plan: Please see individual problem list.  Left flank pain History is consistent with kidney stone. He does have some abdominal tenderness and I dicussed that that is not typical of a kidney  stone. Discussed completing a CT abd/pelvis to evaluate for a cause given his tenderness, though the patient was hesitant to do that given that he has to go back to work. We will obtain labs to evaluate for signs of infection and contact the patient today if he needs to be evaluated with a CT scan or in the ED. He will take flomax for the potential stone. Vicodin for pain. Discussed risk of drowsiness with this and he will not take this and go to work.  He knows that if his pain is uncontrolled by the Vicodin he would need to go to the emergency department.  He will contact us tomorrow if his pain is not improving and he would be willing to do a CT scan at that time.  He is given reasons to seek medical attention in the emergency department.  Orders Placed This Encounter  Procedures  . Comp Met (CMET)    Standing Status:   Future    Standing Expiration Date:   08/14/2020  . CBC w/Diff    Standing Status:   Future    Standing Expiration Date:   08/14/2020  . POCT Urinalysis Dipstick    Meds ordered this encounter  Medications  . tamsulosin (FLOMAX) 0.4 MG CAPS capsule    Sig: Take 1 capsule (0.4 mg total)  by mouth daily.    Dispense:  30 capsule    Refill:  0  . HYDROcodone-acetaminophen (NORCO/VICODIN) 5-325 MG tablet    Sig: Take 1 tablet by mouth every 6 (six) hours as needed for moderate pain.    Dispense:  12 tablet    Refill:  0   Controlled substance database reviewed.   Tommi Rumps, MD [place]

## 2019-08-16 ENCOUNTER — Encounter: Payer: Self-pay | Admitting: Family Medicine

## 2019-08-16 ENCOUNTER — Telehealth: Payer: Self-pay

## 2019-08-16 DIAGNOSIS — R1032 Left lower quadrant pain: Secondary | ICD-10-CM

## 2019-08-16 LAB — MICROALBUMIN / CREATININE URINE RATIO
Creatinine, Urine: 46.3 mg/dL
Microalb Creat Ratio: <6 mg/g creat (ref 0–29)
Microalb, Ur: <3 ug/mL — ABNORMAL HIGH

## 2019-08-16 NOTE — Telephone Encounter (Signed)
Copied from [place] [id number]. Topic: General - Other >> Aug 16, 2019 10:48 AM Sheran Luz wrote: Patient calling to check status of mychart message sent today. Patient requesting call back from [place].

## 2019-08-17 ENCOUNTER — Other Ambulatory Visit: Payer: Self-pay | Admitting: Family Medicine

## 2019-08-17 ENCOUNTER — Telehealth: Payer: Self-pay

## 2019-08-17 ENCOUNTER — Other Ambulatory Visit: Payer: Self-pay

## 2019-08-17 ENCOUNTER — Ambulatory Visit
Admission: RE | Admit: 2019-08-17 | Discharge: 2019-08-17 | Disposition: A | Discharge status: Home / Self Care | Payer: No Typology Code available for payment source | Source: Ambulatory Visit | Attending: Family Medicine | Admitting: Family Medicine

## 2019-08-17 DIAGNOSIS — R7989 Other specified abnormal findings of blood chemistry: Secondary | ICD-10-CM

## 2019-08-17 DIAGNOSIS — R1032 Left lower quadrant pain: Secondary | ICD-10-CM | POA: Insufficient documentation

## 2019-08-17 NOTE — Telephone Encounter (Signed)
Tracey from [facility] called in CT scan results.  Results are in [place].  IMPRESSION: 1. There are mildly prominent bilateral extrarenal pelves, unchanged in appearance when compared to remote prior examination dated 09/08/2009 and of doubtful significance. No evidence of urinary tract calculus or hydronephrosis.

## 2019-08-17 NOTE — Telephone Encounter (Signed)
I spoke with patient & advised on below. He is scheduled with Dr. Caryl Bis 9/16.

## 2019-08-17 NOTE — Telephone Encounter (Signed)
Dr Caryl Bis Carlis Stable, Call pt  He was seen on 08/15/19 for left flank pain, concern for kidney stone  CT renal today was negative for acute stone.  It does show incidental findings including:  There are mildly prominent bilateral extrarenal pelves, unchanged in appearance when compared to remote prior examination dated 09/08/2009 and of doubtful significance.   It also shows Hepatic steatosis.  I have concern that he may have fatty liver disease which would like explain his elevated liver enzymes.   He needs to have a f/u appt with Dr Caryl Bis in the next couple of weeks to discuss CT renal and next steps.  How is patient feeling today? If he continues to have pain, he needs to be re-evaluated as no obvious cause on CT renal.

## 2019-08-18 ENCOUNTER — Ambulatory Visit: Payer: No Typology Code available for payment source | Admitting: Gastroenterology

## 2019-08-18 ENCOUNTER — Other Ambulatory Visit
Admission: RE | Admit: 2019-08-18 | Discharge: 2019-08-18 | Disposition: A | Discharge status: Home / Self Care | Payer: No Typology Code available for payment source | Source: Ambulatory Visit | Attending: Gastroenterology | Admitting: Gastroenterology

## 2019-08-18 ENCOUNTER — Encounter: Payer: Self-pay | Admitting: Gastroenterology

## 2019-08-18 ENCOUNTER — Ambulatory Visit
Admission: RE | Admit: 2019-08-18 | Discharge: 2019-08-18 | Disposition: A | Discharge status: Home / Self Care | Payer: No Typology Code available for payment source | Source: Ambulatory Visit | Attending: Gastroenterology | Admitting: Gastroenterology

## 2019-08-18 VITALS — BP 117/75 | HR 71 | Temp 98.6°F | Resp 17 | Ht 74.0 in | Wt 214.6 lb

## 2019-08-18 DIAGNOSIS — R945 Abnormal results of liver function studies: Secondary | ICD-10-CM | POA: Diagnosis not present

## 2019-08-18 DIAGNOSIS — R0789 Other chest pain: Secondary | ICD-10-CM

## 2019-08-18 DIAGNOSIS — R071 Chest pain on breathing: Secondary | ICD-10-CM

## 2019-08-18 DIAGNOSIS — R1013 Epigastric pain: Secondary | ICD-10-CM | POA: Diagnosis not present

## 2019-08-18 DIAGNOSIS — R7989 Other specified abnormal findings of blood chemistry: Secondary | ICD-10-CM

## 2019-08-18 HISTORY — DX: Essential (primary) hypertension: I10

## 2019-08-18 LAB — COMPREHENSIVE METABOLIC PANEL
ALT: 83 U/L — ABNORMAL HIGH (ref 0–44)
AST: 56 U/L — ABNORMAL HIGH (ref 15–41)
Albumin: 4.6 g/dL (ref 3.5–5.0)
Alkaline Phosphatase: 70 U/L (ref 38–126)
Anion gap: 10 (ref 5–15)
BUN: 19 mg/dL (ref 6–20)
CO2: 24 mmol/L (ref 22–32)
Calcium: 9.5 mg/dL (ref 8.9–10.3)
Chloride: 106 mmol/L (ref 98–111)
Creatinine, Ser: 1.05 mg/dL (ref 0.61–1.24)
GFR calc Af Amer: >60 mL/min (ref >60)
GFR calc non Af Amer: >60 mL/min (ref >60)
Glucose, Bld: 99 mg/dL (ref 70–99)
Potassium: 4 mmol/L (ref 3.5–5.1)
Sodium: 140 mmol/L (ref 135–145)
Total Bilirubin: 0.8 mg/dL (ref 0.3–1.2)
Total Protein: 7.2 g/dL (ref 6.5–8.1)

## 2019-08-18 LAB — CBC
HCT: 40.2 % (ref 39.0–52.0)
Hemoglobin: 13.1 g/dL (ref 13.0–17.0)
MCH: 25.7 pg — ABNORMAL LOW (ref 26.0–34.0)
MCHC: 32.6 g/dL (ref 30.0–36.0)
MCV: 79 fL — ABNORMAL LOW (ref 80.0–100.0)
Platelets: 195 10*3/uL (ref 150–400)
RBC: 5.09 MIL/uL (ref 4.22–5.81)
RDW: 15.3 % (ref 11.5–15.5)
WBC: 7 10*3/uL (ref 4.0–10.5)
nRBC: 0 % (ref 0.0–0.2)

## 2019-08-18 LAB — LIPASE, BLOOD: Lipase: 32 U/L (ref 11–51)

## 2019-08-18 MED ORDER — IOHEXOL 300 MG/ML  SOLN
100.0000 mL | Freq: Once | INTRAMUSCULAR | Status: AC | PRN
  Administered 2019-08-18: 100 mL via INTRAVENOUS

## 2019-08-18 NOTE — Progress Notes (Signed)
Daniel Darby, MD [address]  [place]  [name], [name] [address]  Main: [phone]  Fax: [phone]    Gastroenterology Consultation  Referring Provider:     [name], MD Primary Care Physician:  Daniel Haven, MD Primary Gastroenterologist:  Dr. Cephas Calderon Reason for Consultation:   Abdominal pain, anemia, elevated LFTs        HPI:   Daniel Calderon is a 53 y.o.  male referred by Dr. Caryl Calderon, Daniel Adam, MD  for consultation & management of rectal bleeding.  He has history of testosterone deficiency, erectile dysfunction and BPH with LUTS and chronic fatigue.  He was found to have mild normocytic anemia, hemoglobin 12.9 on 09/28/2017.  Patient started taking oral iron and B12 supplements, his repeat hemoglobin was normal at 4 days later, improved to 14.  He also has chronic fatigue probably secondary to testosterone deficiency.  He gets tired easily, unable to exercise.  He reports that he had a cardiology workup and it was unremarkable.  He is incidentally found to have mildly elevated transaminases.  He has been experiencing several years history of mild, nagging lower abdominal discomfort associated with intermittent rectal bleeding.  Symptoms have remained the same.  He reports bleeding per rectum about 3 times a week associated with stool.  He has 1-2 formed bowel movements daily.  His weight has been stable and he never had a colonoscopy.  He denies any other GI symptoms.  He drinks alcohol about 4 beers per week.  He does not smoke tobacco. He denies family history of colon cancer, other GI malignancies or inflammatory bowel disease He denies having any GI surgeries He is not on any blood thinners  New visit 08/18/2019 Patient was last seen by me in 2018.  Patient is referred to see me for elevated LFTs and abdominal pain. Abdominal pain: Patient woke up on Monday night with severe mid to low back pain, worse in left flank, radiating down  anteriorly to the mid and lower abdomen, associated with nausea as well as pain in epigastric region radiating up to bilateral chest which makes it difficulty breathing, frequent burping.  Patient was frequently burping in clinic today.  He takes omeprazole 20 mg in the morning.  Patient was seen by his PCP, who ordered CT renal protocol which was negative for nephrolithiasis.  He continues to have severe pain and he does not want to go to ER.  He has been managing to drink lots of fluids as well as cranberry juice.  He also drinks alcohol, primarily liquor during weekends about 4 shots per day.  He had 2 shots of tequila over the weekend.  He says he stopped drinking beer and cut down on liquor when he found out that his liver enzymes have been elevated.  He denies fever, vomiting, rectal bleeding, black stools.  He reports having regular bowel movements, last bowel movement was this morning.  He denies NSAID use, does not smoke cigarettes.  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  Labs from 8/24 revealed microcytic anemia, mildly elevated transaminases, normal hemoglobin A1c.  Patient also has new onset of microcytic anemia, hemoglobin 12.6, 3 days ago, last normal hemoglobin was 15.2 in 05/2018.  His ferritin levels were low at 24 in 12/2017.  Iron studies consistent with iron deficiency anemia.  Patient is not on iron supplementation.  GI Procedures:  Colonoscopy 11/11/2017 for rectal bleeding -  Non-thrombosed external hemorrhoids found on perianal exam. - The examined portion of the ileum was normal. - Normal mucosa in the entire examined colon. - External hemorrhoids.     Past Medical History:  Diagnosis Date  . Allergy    Seasonal  . Anxiety   . Benign enlargement of prostate   . Depression   . Elevated BP   . Elevated transaminase level   . Failure of erection   . Fatigue   . Frequent headaches   . GERD (gastroesophageal reflux disease)   .  Hypogonadism in male   . Kidney stones    History of kidney stones  . Migraine   . Sinus congestion 09/01/2017  . Sleep apnea    Currently uses cpap  . Thoracic outlet syndrome    Left Arm  . Tongue pain 02/09/2019    Past Surgical History:  Procedure Laterality Date  . COLONOSCOPY WITH PROPOFOL N/A 11/11/2017   Procedure: COLONOSCOPY WITH PROPOFOL;  Surgeon: Daniel Landsman, MD;  Location: [facility];  Service: Endoscopy;  Laterality: N/A;  . KNEE ARTHROSCOPY WITH MEDIAL MENISECTOMY Left 07/26/2018   Procedure: KNEE ARTHROSCOPY WITH PARTIAL MEDIAL MENISECTOMY;  Surgeon: Daniel Fabry, MD;  Location: [facility];  Service: Orthopedics;  Laterality: Left;  . Left Shoulder Surgery  2011  . nerve block     2 in neck. 5 in back.  . NOSE SURGERY    . POLYPECTOMY  11/11/2017   Procedure: POLYPECTOMY;  Surgeon: Daniel Landsman, MD;  Location: [facility];  Service: Endoscopy;;  . SCALENE NODE BIOPSY / EXCISION    . VASECTOMY      Current Outpatient Medications:  .  amLODipine (NORVASC) 10 MG tablet, Take 0.5 tablets (5 mg total) by mouth daily., Disp: 45 tablet, Rfl: 2 .  calcipotriene (DOVONOX) 0.005 % cream, Apply 1 application topically daily as needed (for psoriasis)., Disp: , Rfl:  .  Multiple Vitamin (MULTIVITAMIN) tablet, Take 1 tablet by mouth daily., Disp: , Rfl:  .  omeprazole (PRILOSEC) 20 MG capsule, TAKE 1 CAPSULE BY MOUTH DAILY, Disp: 90 capsule, Rfl: 3 .  sertraline (ZOLOFT) 100 MG tablet, Take 1 tablet by mouth once daily., Disp: 90 tablet, Rfl: 1 .  tamsulosin (FLOMAX) 0.4 MG CAPS capsule, Take 1 capsule (0.4 mg total) by mouth daily., Disp: 30 capsule, Rfl: 0 .  testosterone (ANDROGEL) 50 MG/5GM (1%) GEL, Place 5 g onto the skin daily., Disp: 30 Tube, Rfl: 3 .  HYDROcodone-acetaminophen (NORCO/VICODIN) 5-325 MG tablet, Take 1 tablet by mouth every 6 (six) hours as needed for moderate pain. (Patient not taking: Reported on 08/18/2019), Disp: 12 tablet,  Rfl: 0    Family History  Problem Relation Age of Onset  . Hypertension Mother        Living  . Hearing loss Mother   . Heart disease Other   . Healthy Father        Living  . Diabetes Brother   . Stroke Paternal Uncle   . Heart attack Paternal Uncle   . Hearing loss Maternal Grandmother   . Healthy Son   . Healthy Daughter   . Kidney cancer Neg Hx   . Bladder Cancer Neg Hx   . Prostate cancer Neg Hx      Social History   Tobacco Use  . Smoking status: Never Smoker  . Smokeless tobacco: Never Used  Substance Use Topics  . Alcohol use: Yes    Alcohol/week: 3.0 standard drinks  Types: 3 Cans of beer per week  . Drug use: No    Allergies as of 08/18/2019 - Review Complete 08/18/2019  Allergen Reaction Noted  . Erythromycin Nausea And Vomiting 03/24/2015  . Cephalexin Other (See Comments) 05/07/2015  . Dexamethasone Other (See Comments) 05/07/2015  . Oxycodone-acetaminophen Other (See Comments) 05/16/2016  . Prednisone Other (See Comments) 05/07/2015  . Gabapentin Other (See Comments) 06/26/2015  . Ibuprofen Itching and Other (See Comments) 03/24/2015    Review of Systems:    All systems reviewed and negative except where noted in HPI.   Physical Exam:  BP 117/75 (BP Location: Left Arm, Patient Position: Sitting, Cuff Size: Large)   Pulse 71   Temp 98.6 F (37 C)   Resp 17   Ht 6\' 2"  (1.88 m)   Wt 214 lb 9.6 oz (97.3 kg)   BMI 27.55 kg/m  No LMP for male patient.  General:   Alert,  Well-developed, well-nourished, pleasant and cooperative in NAD Head:  Normocephalic and atraumatic. Eyes:  Sclera clear, no icterus.   Conjunctiva pink. Ears:  Normal auditory acuity. Nose:  No deformity, discharge, or lesions. Mouth:  No deformity or lesions,oropharynx pink & moist. Neck:  Supple; no masses or thyromegaly. Lungs:  Respirations even and unlabored.  Clear throughout to auscultation.   No wheezes, crackles, or rhonchi. No acute distress. Heart:  Regular  rate and rhythm; no murmurs, clicks, rubs, or gallops. Abdomen:  Normal bowel sounds.  No bruits.  Soft, moderate epigastric tenderness with voluntary guarding and non-distended without masses, hepatosplenomegaly or hernias noted.   Rectal: Nor performed Msk:  Symmetrical without gross deformities. Good, equal movement & strength bilaterally. Pulses:  Normal pulses noted. Extremities:  No clubbing or edema.  No cyanosis. Neurologic:  Alert and oriented x3;  grossly normal neurologically. Skin:  Intact without significant lesions or rashes. No jaundice. Psych:  Alert and cooperative. Normal mood and affect.  Imaging Studies: Reviewed  Assessment and Plan:   SUHAAN JAQUES is a 53 y.o. Caucasian male with testosterone deficiency, erectile dysfunction and BPH with LUTS, chronic fatigue is seen in consultation for elevated LFTs, iron deficiency anemia as well as acute abdominal pain  Acute upper abdominal pain: Given his history of alcohol use, I am concerned about acute pancreatitis or acute cholecystitis or peptic ulcer disease I advised patient to go to ER but he preferred not to as he is worried about co-pay Recommend CT abdomen and pelvis with IV contrast Recheck CBC, CMP, serum lipase levels today Further recommendations based on the CT scan results.  Asked him to go to ER right away if his pain is getting worse Complete abstinence from alcohol Increase omeprazole to 40 mg daily  Bilateral chest pain, atypical associated with shortness of breath and radiating from epigastric region Recommend CT chest with IV contrast  Elevated LFTs: History of alcohol use, fatty liver on imaging, likely combination of both -acute viral hepatitis panel negative -We will perform secondary liver disease work-up after acute issues resolve -Complete abstinence from alcohol use  Iron deficiency anemia Further work-up after acute issues resolve   Follow up in 2 weeks or sooner if needed    Daniel Darby, MD

## 2019-08-19 ENCOUNTER — Telehealth: Payer: Self-pay | Admitting: Gastroenterology

## 2019-08-19 DIAGNOSIS — R1013 Epigastric pain: Secondary | ICD-10-CM

## 2019-08-19 MED ORDER — OMEPRAZOLE 40 MG PO CPDR: 40.0000 mg | DELAYED_RELEASE_CAPSULE | Freq: Two times a day (BID) | ORAL | 0 refills | Status: DC

## 2019-08-19 NOTE — Telephone Encounter (Signed)
Noted thank you

## 2019-08-19 NOTE — Telephone Encounter (Signed)
Call pt  Daniel Calderon spoke with him however I was to sure How is patient feeling today? Does he have flank pain?   If he continues to have pain, he needs to be re-evaluated as no obvious cause on CT renal.

## 2019-08-19 NOTE — Telephone Encounter (Signed)
Just to be sure he doesnt feel he needs to be seen today; he may would have to go to UC or ED depending on availability however I want to ensure is safe and has adequate medical care.

## 2019-08-19 NOTE — Telephone Encounter (Signed)
Called and spoke to patient.  Patient said that he had a phone appt this morning w/ Dr. Marius Ditch from [facility].  Pt said that Dr. Marius Ditch reviewed his CT results and is recommending that he has an endoscopy.  Patient said that he should be expecting a call to be scheduled today or Monday.    Patient said that he is still having lower back pain, stomach pain and chest tightness.  Pt said that he has pain when he moves around which is usually a 5 or 6 on pain scale.  Pt said that it feels uncomfortable and today his pain is rated between a 2 and 5.

## 2019-08-19 NOTE — Telephone Encounter (Signed)
Called patient to discuss the CT scan results.  There is no evidence of acute pancreatitis, acute cholecystitis.  No other acute pathology.  He does not have urinary symptoms and UA was -3 days ago.  I do not think he needs to be treated for UTI. He continues to feel bad, epigastric pain.  Trying to keep himself hydrated.  I recommended him to completely avoid alcohol.  Start omeprazole 40 mg twice daily in the meantime  Also, recommended upper endoscopy which he is agreeable  [name], MD [address]  [place], [place] [address]  Main: [phone]  Fax: [phone] Pager: [phone]

## 2019-08-19 NOTE — Telephone Encounter (Signed)
Called pt. No answer. LMTCB

## 2019-08-19 NOTE — Telephone Encounter (Signed)
Patient says that he feels fine and doesn't think that he needs to seen today.  Patient advised to got UC or ED if symptoms worsen.

## 2019-08-22 ENCOUNTER — Other Ambulatory Visit: Payer: Self-pay

## 2019-08-22 ENCOUNTER — Other Ambulatory Visit
Admission: RE | Admit: 2019-08-22 | Discharge: 2019-08-22 | Disposition: A | Discharge status: Home / Self Care | Payer: No Typology Code available for payment source | Source: Ambulatory Visit | Attending: Gastroenterology | Admitting: Gastroenterology

## 2019-08-22 DIAGNOSIS — Z01812 Encounter for preprocedural laboratory examination: Secondary | ICD-10-CM | POA: Insufficient documentation

## 2019-08-22 DIAGNOSIS — Z20828 Contact with and (suspected) exposure to other viral communicable diseases: Secondary | ICD-10-CM | POA: Diagnosis not present

## 2019-08-22 DIAGNOSIS — R1013 Epigastric pain: Secondary | ICD-10-CM

## 2019-08-22 LAB — SARS CORONAVIRUS 2 (TAT 6-24 HRS): SARS Coronavirus 2: NEGATIVE

## 2019-08-25 ENCOUNTER — Encounter: Payer: Self-pay | Admitting: Emergency Medicine

## 2019-08-25 ENCOUNTER — Ambulatory Visit: Payer: No Typology Code available for payment source | Admitting: Anesthesiology

## 2019-08-25 ENCOUNTER — Ambulatory Visit
Admission: RE | Admit: 2019-08-25 | Discharge: 2019-08-25 | Disposition: A | Discharge status: Home / Self Care | Payer: No Typology Code available for payment source | Attending: Gastroenterology | Admitting: Gastroenterology

## 2019-08-25 ENCOUNTER — Encounter
Admission: RE | Disposition: A | Discharge status: Home / Self Care | Payer: Self-pay | Source: Home / Self Care | Attending: Gastroenterology

## 2019-08-25 DIAGNOSIS — Z888 Allergy status to other drugs, medicaments and biological substances status: Secondary | ICD-10-CM | POA: Insufficient documentation

## 2019-08-25 DIAGNOSIS — R1013 Epigastric pain: Secondary | ICD-10-CM | POA: Diagnosis not present

## 2019-08-25 DIAGNOSIS — N400 Enlarged prostate without lower urinary tract symptoms: Secondary | ICD-10-CM | POA: Diagnosis not present

## 2019-08-25 DIAGNOSIS — F329 Major depressive disorder, single episode, unspecified: Secondary | ICD-10-CM | POA: Diagnosis not present

## 2019-08-25 DIAGNOSIS — Z881 Allergy status to other antibiotic agents status: Secondary | ICD-10-CM | POA: Insufficient documentation

## 2019-08-25 DIAGNOSIS — Z886 Allergy status to analgesic agent status: Secondary | ICD-10-CM | POA: Insufficient documentation

## 2019-08-25 DIAGNOSIS — K219 Gastro-esophageal reflux disease without esophagitis: Secondary | ICD-10-CM | POA: Diagnosis not present

## 2019-08-25 DIAGNOSIS — Z8249 Family history of ischemic heart disease and other diseases of the circulatory system: Secondary | ICD-10-CM | POA: Insufficient documentation

## 2019-08-25 DIAGNOSIS — G709 Myoneural disorder, unspecified: Secondary | ICD-10-CM | POA: Insufficient documentation

## 2019-08-25 DIAGNOSIS — L409 Psoriasis, unspecified: Secondary | ICD-10-CM | POA: Insufficient documentation

## 2019-08-25 DIAGNOSIS — F419 Anxiety disorder, unspecified: Secondary | ICD-10-CM | POA: Insufficient documentation

## 2019-08-25 DIAGNOSIS — Z885 Allergy status to narcotic agent status: Secondary | ICD-10-CM | POA: Insufficient documentation

## 2019-08-25 DIAGNOSIS — I10 Essential (primary) hypertension: Secondary | ICD-10-CM | POA: Insufficient documentation

## 2019-08-25 DIAGNOSIS — Z79899 Other long term (current) drug therapy: Secondary | ICD-10-CM | POA: Diagnosis not present

## 2019-08-25 DIAGNOSIS — G4730 Sleep apnea, unspecified: Secondary | ICD-10-CM | POA: Diagnosis not present

## 2019-08-25 HISTORY — DX: Personal history of urinary calculi: Z87.442

## 2019-08-25 HISTORY — PX: ESOPHAGOGASTRODUODENOSCOPY (EGD) WITH PROPOFOL: SHX5813

## 2019-08-25 SURGERY — ESOPHAGOGASTRODUODENOSCOPY (EGD) WITH PROPOFOL
Anesthesia: General

## 2019-08-25 MED ORDER — SODIUM CHLORIDE 0.9 % IV SOLN
INTRAVENOUS | Status: DC
  Administered 2019-08-25: 09:00:00 1000 mL via INTRAVENOUS

## 2019-08-25 MED ORDER — LIDOCAINE HCL (CARDIAC) PF 100 MG/5ML IV SOSY
PREFILLED_SYRINGE | INTRAVENOUS | Status: DC | PRN
  Administered 2019-08-25: 50 mg via INTRAVENOUS

## 2019-08-25 MED ORDER — PROPOFOL 10 MG/ML IV BOLUS
INTRAVENOUS | Status: DC | PRN
  Administered 2019-08-25: 20 mg via INTRAVENOUS
  Administered 2019-08-25: 30 mg via INTRAVENOUS
  Administered 2019-08-25: 100 mg via INTRAVENOUS
  Administered 2019-08-25: 30 mg via INTRAVENOUS

## 2019-08-25 MED ORDER — LIDOCAINE HCL (PF) 2 % IJ SOLN
INTRAMUSCULAR | Status: AC
  Filled 2019-08-25: qty 10

## 2019-08-25 NOTE — Op Note (Signed)
[facility] Gastroenterology Patient Name: Daniel Calderon Procedure Date: 08/25/2019 10:02 AM MRN: [id number] Account #: [id number] Date of Birth: 1966/12/14 Admit Type: Outpatient Age: 53 Room: [facility] ENDO ROOM 3 Gender: Male Note Status: Finalized Procedure:            Upper GI endoscopy Indications:          Epigastric abdominal pain Providers:            Lin Landsman MD, MD Referring MD:         Angela Adam. Caryl Bis (Referring MD) Medicines:            Monitored Anesthesia Care Complications:        No immediate complications. Estimated blood loss: None. Procedure:            Pre-Anesthesia Assessment:                       - Prior to the procedure, a History and Physical was                        performed, and patient medications and allergies were                        reviewed. The patient is competent. The risks and                        benefits of the procedure and the sedation options and                        risks were discussed with the patient. All questions                        were answered and informed consent was obtained.                        Patient identification and proposed procedure were                        verified by the physician, the nurse, the                        anesthesiologist, the anesthetist and the technician in                        the pre-procedure area in the procedure room in the                        endoscopy suite. Mental Status Examination: alert and                        oriented. Airway Examination: normal oropharyngeal                        airway and neck mobility. Respiratory Examination:                        clear to auscultation. CV Examination: normal.                        Prophylactic Antibiotics: The patient does not require  prophylactic antibiotics. Prior Anticoagulants: The                        patient has taken no previous anticoagulant or       antiplatelet agents. ASA Grade Assessment: II - A                        patient with mild systemic disease. After reviewing the                        risks and benefits, the patient was deemed in                        satisfactory condition to undergo the procedure. The                        anesthesia plan was to use monitored anesthesia care                        (MAC). Immediately prior to administration of                        medications, the patient was re-assessed for adequacy                        to receive sedatives. The heart rate, respiratory rate,                        oxygen saturations, blood pressure, adequacy of                        pulmonary ventilation, and response to care were                        monitored throughout the procedure. The physical status                        of the patient was re-assessed after the procedure.                       After obtaining informed consent, the endoscope was                        passed under direct vision. Throughout the procedure,                        the patient's blood pressure, pulse, and oxygen                        saturations were monitored continuously. The Endoscope                        was introduced through the mouth, and advanced to the                        second part of duodenum. The upper GI endoscopy was                        accomplished without difficulty. The patient tolerated  the procedure well. Findings:      The duodenal bulb and second portion of the duodenum were normal.      The entire examined stomach was normal. Biopsies were taken with a cold       forceps for Helicobacter pylori testing.      The gastroesophageal junction and examined esophagus were normal. Impression:           - Normal duodenal bulb and second portion of the                        duodenum.                       - Normal stomach. Biopsied.                       - Normal  gastroesophageal junction and esophagus. Recommendation:       - Await pathology results.                       - Discharge patient to home (with escort).                       - Resume previous diet today.                       - Return to my office as previously scheduled. Procedure Code(s):    --- Professional ---                       [phone], Esophagogastroduodenoscopy, flexible, transoral;                        with biopsy, single or multiple Diagnosis Code(s):    --- Professional ---                       R10.13, Epigastric pain CPT copyright 2019 American Medical Association. All rights reserved. The codes documented in this report are preliminary and upon coder review may  be revised to meet current compliance requirements. Dr. Ulyess Mort Lin Landsman MD, MD 08/25/2019 10:20:49 AM This report has been signed electronically. Number of Addenda: 0 Note Initiated On: 08/25/2019 10:02 AM Estimated Blood Loss: Estimated blood loss: none.      [facility]

## 2019-08-25 NOTE — Transfer of Care (Signed)
Immediate Anesthesia Transfer of Care Note  Patient: Daniel Calderon  Procedure(s) Performed: ESOPHAGOGASTRODUODENOSCOPY (EGD) WITH PROPOFOL (N/A )  Patient Location: Endoscopy Unit  Anesthesia Type:General  Level of Consciousness: drowsy and patient cooperative  Airway & Oxygen Therapy: Patient Spontanous Breathing and Patient connected to nasal cannula oxygen  Post-op Assessment: Report given to RN and Post -op Vital signs reviewed and stable  Post vital signs: Reviewed and stable  Last Vitals:  Vitals Value Taken Time  BP 113/76 08/25/19 1025  Temp    Pulse 64 08/25/19 1025  Resp 16 08/25/19 1025  SpO2 96 % 08/25/19 1025  Vitals shown include unvalidated device data.  Last Pain:  Vitals:   08/25/19 1024  TempSrc:   PainSc: Asleep         Complications: No apparent anesthesia complications

## 2019-08-25 NOTE — H&P (Signed)
Daniel Darby, MD [address]  [place], [place] [address]  Main: [phone]  Fax: [phone] Pager: [phone]  Primary Care Physician:  Daniel Haven, MD Primary Gastroenterologist:  Dr. Cephas Calderon  Pre-Procedure History & Physical: HPI:  Daniel Calderon is a 53 y.o. male is here for an endoscopy.   Past Medical History:  Diagnosis Date  . Allergy    Seasonal  . Anxiety   . Benign enlargement of prostate   . Depression   . Elevated BP   . Elevated transaminase level   . Failure of erection   . Fatigue   . Frequent headaches   . GERD (gastroesophageal reflux disease)   . History of kidney stones   . Hypertension   . Hypogonadism in male   . Migraine   . Sinus congestion 09/01/2017  . Sleep apnea    Currently uses cpap  . Thoracic outlet syndrome    Left Arm  . Tongue pain 02/09/2019    Past Surgical History:  Procedure Laterality Date  . COLONOSCOPY WITH PROPOFOL N/A 11/11/2017   Procedure: COLONOSCOPY WITH PROPOFOL;  Surgeon: Daniel Landsman, MD;  Location: [facility];  Service: Endoscopy;  Laterality: N/A;  . KNEE ARTHROSCOPY WITH MEDIAL MENISECTOMY Left 07/26/2018   Procedure: KNEE ARTHROSCOPY WITH PARTIAL MEDIAL MENISECTOMY;  Surgeon: Daniel Fabry, MD;  Location: [facility];  Service: Orthopedics;  Laterality: Left;  . Left Shoulder Surgery  2011  . nerve block     2 in neck. 5 in back.  . NOSE SURGERY    . POLYPECTOMY  11/11/2017   Procedure: POLYPECTOMY;  Surgeon: Daniel Landsman, MD;  Location: [facility];  Service: Endoscopy;;  . SCALENE NODE BIOPSY / EXCISION    . VASECTOMY      Prior to Admission medications   Medication Sig Start Date End Date Taking? Authorizing Provider  amLODipine (NORVASC) 10 MG tablet Take 0.5 tablets (5 mg total) by mouth daily. 08/10/19  Yes Daniel Haven, MD  calcipotriene (DOVONOX) 0.005 % cream Apply 1 application topically daily as needed (for psoriasis).   Yes  [provider]  HYDROcodone-acetaminophen (NORCO/VICODIN) 5-325 MG tablet Take 1 tablet by mouth every 6 (six) hours as needed for moderate pain. 08/15/19  Yes Daniel Haven, MD  Multiple Vitamin (MULTIVITAMIN) tablet Take 1 tablet by mouth daily.   Yes [provider]  omeprazole (PRILOSEC) 40 MG capsule Take 1 capsule (40 mg total) by mouth 2 (two) times daily before a meal. 08/19/19 09/18/19 Yes Daniel Calderon, Tally Due, MD  sertraline (ZOLOFT) 100 MG tablet Take 1 tablet by mouth once daily. 05/06/19  Yes Daniel Haven, MD  tamsulosin (FLOMAX) 0.4 MG CAPS capsule Take 1 capsule (0.4 mg total) by mouth daily. 08/15/19  Yes Daniel Haven, MD  testosterone (ANDROGEL) 50 MG/5GM (1%) GEL Place 5 g onto the skin daily. 06/02/19  Yes Daniel Calderon, Ronda Fairly, MD    Allergies as of 08/22/2019 - Review Complete 08/18/2019  Allergen Reaction Noted  . Erythromycin Nausea And Vomiting 03/24/2015  . Cephalexin Other (See Comments) 05/07/2015  . Dexamethasone Other (See Comments) 05/07/2015  . Oxycodone-acetaminophen Other (See Comments) 05/16/2016  . Prednisone Other (See Comments) 05/07/2015  . Gabapentin Other (See Comments) 06/26/2015  . Ibuprofen Itching and Other (See Comments) 03/24/2015    Family History  Problem Relation Age of Onset  . Hypertension Mother        Living  . Hearing  loss Mother   . Heart disease Other   . Healthy Father        Living  . Diabetes Brother   . Stroke Paternal Uncle   . Heart attack Paternal Uncle   . Hearing loss Maternal Grandmother   . Healthy Son   . Healthy Daughter   . Kidney cancer Neg Hx   . Bladder Cancer Neg Hx   . Prostate cancer Neg Hx     Social History   Socioeconomic History  . Marital status: Married    Spouse name: Not on file  . Number of children: Not on file  . Years of education: Not on file  . Highest education level: Not on file  Occupational History  . Not on file  Social Needs  . Financial resource  strain: Not on file  . Food insecurity    Worry: Not on file    Inability: Not on file  . Transportation needs    Medical: Not on file    Non-medical: Not on file  Tobacco Use  . Smoking status: Never Smoker  . Smokeless tobacco: Never Used  Substance and Sexual Activity  . Alcohol use: Yes    Alcohol/week: 3.0 standard drinks    Types: 3 Cans of beer per week  . Drug use: No  . Sexual activity: Yes    Partners: Female    Comment: Wife  Lifestyle  . Physical activity    Days per week: Not on file    Minutes per session: Not on file  . Stress: Not on file  Relationships  . Social Herbalist on phone: Not on file    Gets together: Not on file    Attends religious service: Not on file    Active member of club or organization: Not on file    Attends meetings of clubs or organizations: Not on file    Relationship status: Not on file  . Intimate partner violence    Fear of current or ex partner: Not on file    Emotionally abused: Not on file    Physically abused: Not on file    Forced sexual activity: Not on file  Other Topics Concern  . 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 Neck:  Supple; no masses or thyromegaly. Lungs:  Clear throughout to auscultation.    Heart:  Regular rate and rhythm. Abdomen:  Soft, nontender and nondistended. Normal bowel sounds, without guarding, and without rebound.   Neurologic:  Alert and  oriented x4;  grossly normal neurologically.  Impression/Plan: Daniel Calderon is here for an endoscopy to be performed for epigastric pain  Risks, benefits, limitations, and alternatives regarding  endoscopy have been reviewed with the patient.  Questions have been answered.  All parties agreeable.   Daniel Sear, MD  08/25/2019, 9:26 AM

## 2019-08-25 NOTE — Anesthesia Preprocedure Evaluation (Signed)
Anesthesia Evaluation  Patient identified by MRN, date of birth, ID band Patient awake    Reviewed: Allergy & Precautions, H&P , NPO status , Patient's Chart, lab work & pertinent test results, reviewed documented beta blocker date and time   Airway Mallampati: II   Neck ROM: full    Dental  (+) Poor Dentition   Pulmonary neg pulmonary ROS, sleep apnea ,    Pulmonary exam normal        Cardiovascular Exercise Tolerance: Good hypertension, On Medications negative cardio ROS Normal cardiovascular exam Rhythm:regular Rate:Normal     Neuro/Psych  Headaches, PSYCHIATRIC DISORDERS Anxiety Depression  Neuromuscular disease negative neurological ROS  negative psych ROS   GI/Hepatic negative GI ROS, Neg liver ROS, GERD  ,  Endo/Other  negative endocrine ROS  Renal/GU negative Renal ROS  negative genitourinary   Musculoskeletal   Abdominal   Peds  Hematology negative hematology ROS (+)   Anesthesia Other Findings Past Medical History: No date: Allergy     Comment:  Seasonal No date: Anxiety No date: Benign enlargement of prostate No date: Depression No date: Elevated BP No date: Elevated transaminase level No date: Failure of erection No date: Fatigue No date: Frequent headaches No date: GERD (gastroesophageal reflux disease) No date: History of kidney stones No date: Hypertension No date: Hypogonadism in male No date: Migraine 09/01/2017: Sinus congestion No date: Sleep apnea     Comment:  Currently uses cpap No date: Thoracic outlet syndrome     Comment:  Left Arm 02/09/2019: Tongue pain Past Surgical History: 11/11/2017: COLONOSCOPY WITH PROPOFOL; N/A     Comment:  Procedure: COLONOSCOPY WITH PROPOFOL;  Surgeon: Lin Landsman, MD;  Location: [facility];                Service: Endoscopy;  Laterality: N/A; 07/26/2018: KNEE ARTHROSCOPY WITH MEDIAL MENISECTOMY; Left     Comment:   Procedure: KNEE ARTHROSCOPY WITH PARTIAL MEDIAL               MENISECTOMY;  Surgeon: Leim Fabry, MD;  Location: [facility]               [facility];  Service: Orthopedics;  Laterality: Left; 2011: Left Shoulder Surgery No date: nerve block     Comment:  2 in neck. 5 in back. No date: NOSE SURGERY 11/11/2017: POLYPECTOMY     Comment:  Procedure: POLYPECTOMY;  Surgeon: Lin Landsman,               MD;  Location: [facility];  Service: Endoscopy;; No date: SCALENE NODE BIOPSY / EXCISION No date: VASECTOMY BMI    Body Mass Index: 26.96 kg/m     Reproductive/Obstetrics negative OB ROS                             Anesthesia Physical Anesthesia Plan  ASA: II  Anesthesia Plan: General   Post-op Pain Management:    Induction:   PONV Risk Score and Plan:   Airway Management Planned:   Additional Equipment:   Intra-op Plan:   Post-operative Plan:   Informed Consent: I have reviewed the patients History and Physical, chart, labs and discussed the procedure including the risks, benefits and alternatives for the proposed anesthesia with the patient or authorized representative who has indicated his/her understanding and acceptance.     Dental Advisory Given  Plan Discussed with: CRNA  Anesthesia Plan Comments:         Anesthesia Quick Evaluation

## 2019-08-25 NOTE — Anesthesia Post-op Follow-up Note (Signed)
Anesthesia QCDR form completed.        

## 2019-08-26 ENCOUNTER — Encounter: Payer: Self-pay | Admitting: Gastroenterology

## 2019-08-26 LAB — SURGICAL PATHOLOGY

## 2019-09-01 NOTE — Anesthesia Postprocedure Evaluation (Signed)
Anesthesia Post Note  Patient: Daniel Calderon  Procedure(s) Performed: ESOPHAGOGASTRODUODENOSCOPY (EGD) WITH PROPOFOL (N/A )  Patient location during evaluation: PACU Anesthesia Type: General Level of consciousness: awake and alert Pain management: pain level controlled Vital Signs Assessment: post-procedure vital signs reviewed and stable Respiratory status: spontaneous breathing, nonlabored ventilation, respiratory function stable and patient connected to nasal cannula oxygen Cardiovascular status: blood pressure returned to baseline and stable Postop Assessment: no apparent nausea or vomiting Anesthetic complications: no     Last Vitals:  Vitals:   08/25/19 1025 08/25/19 1054  BP: 113/76 (!) 126/92  Pulse:    Resp: 18   Temp:    SpO2: 96%     Last Pain:  Vitals:   08/26/19 0736  TempSrc:   PainSc: 0-No pain                 Molli Barrows

## 2019-09-06 ENCOUNTER — Other Ambulatory Visit: Payer: Self-pay

## 2019-09-07 ENCOUNTER — Ambulatory Visit: Payer: No Typology Code available for payment source | Admitting: Family Medicine

## 2019-09-07 DIAGNOSIS — Z0289 Encounter for other administrative examinations: Secondary | ICD-10-CM

## 2019-09-08 ENCOUNTER — Telehealth: Payer: Self-pay | Admitting: Family Medicine

## 2019-09-08 ENCOUNTER — Encounter: Payer: Self-pay | Admitting: Family Medicine

## 2019-09-08 NOTE — Telephone Encounter (Signed)
Testosterone has been approved. Approval: [id number] Dates: 09/05/2019-09/03/2020

## 2019-09-13 ENCOUNTER — Ambulatory Visit: Payer: No Typology Code available for payment source | Admitting: Urology

## 2019-09-14 ENCOUNTER — Ambulatory Visit: Payer: No Typology Code available for payment source | Admitting: Urology

## 2019-09-14 ENCOUNTER — Encounter: Payer: Self-pay | Admitting: Urology

## 2019-09-14 VITALS — BP 119/75 | HR 86 | Ht 74.0 in | Wt 214.0 lb

## 2019-09-14 DIAGNOSIS — E291 Testicular hypofunction: Secondary | ICD-10-CM

## 2019-09-14 DIAGNOSIS — N5201 Erectile dysfunction due to arterial insufficiency: Secondary | ICD-10-CM

## 2019-09-14 NOTE — Progress Notes (Signed)
09/14/2019 11:57 AM   Daniel Calderon [birth year] [id number]  Referring provider: [name], MD [address] [place],  [place] [address]  Chief Complaint  Patient presents with  . Hypogonadism    HPI: 53 y.o. male presents for follow-up of hypogonadism.  He is currently on AndroGel.  He has good energy and libido.  He is complaining of worsening erectile dysfunction.  He has tried both tadalafil and sildenafil in the past and states he could not tolerate secondary to side effects of flushing.  Denies bothersome lower urinary tract symptoms, breast tenderness/enlargement or lower extremity edema.  A testosterone level in June 2020 was 165 however he had been off the medication for 3 days.  He has been recently evaluated for left chest wall pain and had a CT which did not show genitourinary abnormalities.   PMH: Past Medical History:  Diagnosis Date  . Allergy    Seasonal  . Anxiety   . Benign enlargement of prostate   . Depression   . Elevated BP   . Elevated transaminase level   . Failure of erection   . Fatigue   . Frequent headaches   . GERD (gastroesophageal reflux disease)   . History of kidney stones   . Hypertension   . Hypogonadism in male   . Migraine   . Sinus congestion 09/01/2017  . Sleep apnea    Currently uses cpap  . Thoracic outlet syndrome    Left Arm  . Tongue pain 02/09/2019    Surgical History: Past Surgical History:  Procedure Laterality Date  . COLONOSCOPY WITH PROPOFOL N/A 11/11/2017   Procedure: COLONOSCOPY WITH PROPOFOL;  Surgeon: Lin Landsman, MD;  Location: [facility];  Service: Endoscopy;  Laterality: N/A;  . ESOPHAGOGASTRODUODENOSCOPY (EGD) WITH PROPOFOL N/A 08/25/2019   Procedure: ESOPHAGOGASTRODUODENOSCOPY (EGD) WITH PROPOFOL;  Surgeon: Lin Landsman, MD;  Location: [facility];  Service: Gastroenterology;  Laterality: N/A;  . KNEE ARTHROSCOPY WITH MEDIAL MENISECTOMY Left 07/26/2018   Procedure: KNEE ARTHROSCOPY WITH PARTIAL MEDIAL MENISECTOMY;  Surgeon: Leim Fabry, MD;  Location: [facility];  Service: Orthopedics;  Laterality: Left;  . Left Shoulder Surgery  2011  . nerve block     2 in neck. 5 in back.  . NOSE SURGERY    . POLYPECTOMY  11/11/2017   Procedure: POLYPECTOMY;  Surgeon: Lin Landsman, MD;  Location: [facility];  Service: Endoscopy;;  . SCALENE NODE BIOPSY / EXCISION    . VASECTOMY      Home Medications:  Allergies as of 09/14/2019      Reactions   Erythromycin Nausea And Vomiting   Cephalexin Other (See Comments)   Unknown   Dexamethasone Other (See Comments)   Unknown   Oxycodone-acetaminophen Other (See Comments)   Unknown   Prednisone Other (See Comments)   Unknown   Gabapentin Other (See Comments)   Aggressive   Ibuprofen Itching, Other (See Comments)   Can take in low doses per pt      Medication List       Accurate as of September 14, 2019 11:57 AM. If you have any questions, ask your nurse or doctor.        STOP taking these medications   HYDROcodone-acetaminophen 5-325 MG tablet Commonly known as: NORCO/VICODIN Stopped by: Abbie Sons, MD     TAKE these medications   amLODipine 10 MG tablet Commonly known as: NORVASC Take 0.5 tablets (5 mg total) by mouth daily.  calcipotriene 0.005 % cream Commonly known as: DOVONOX Apply 1 application topically daily as needed (for psoriasis).   cyanocobalamin 1000 MCG/ML injection Commonly known as: (VITAMIN B-12)   multivitamin tablet Take 1 tablet by mouth daily.   omeprazole 40 MG capsule Commonly known as: PRILOSEC Take 1 capsule (40 mg total) by mouth 2 (two) times daily before a meal.   sertraline 100 MG tablet Commonly known as: ZOLOFT Take 1 tablet by mouth once daily.   tamsulosin 0.4 MG Caps capsule Commonly known as: FLOMAX Take 1 capsule (0.4 mg total) by mouth daily.   testosterone 50 MG/5GM (1%) Gel Commonly known as: ANDROGEL Place 5 g  onto the skin daily.       Allergies:  Allergies  Allergen Reactions  . Erythromycin Nausea And Vomiting  . Cephalexin Other (See Comments)    Unknown  . Dexamethasone Other (See Comments)    Unknown  . Oxycodone-Acetaminophen Other (See Comments)    Unknown  . Prednisone Other (See Comments)    Unknown  . Gabapentin Other (See Comments)    Aggressive  . Ibuprofen Itching and Other (See Comments)    Can take in low doses per pt    Family History: Family History  Problem Relation Age of Onset  . Hypertension Mother        Living  . Hearing loss Mother   . Heart disease Other   . Healthy Father        Living  . Diabetes Brother   . Stroke Paternal Uncle   . Heart attack Paternal Uncle   . Hearing loss Maternal Grandmother   . Healthy Son   . Healthy Daughter   . Kidney cancer Neg Hx   . Bladder Cancer Neg Hx   . Prostate cancer Neg Hx     Social History:  reports that he has never smoked. He has never used smokeless tobacco. He reports current alcohol use of about 3.0 standard drinks of alcohol per week. He reports that he does not use drugs.  ROS: UROLOGY Frequent Urination?: No Hard to postpone urination?: No Burning/pain with urination?: No Get up at night to urinate?: No Leakage of urine?: No Urine stream starts and stops?: No Trouble starting stream?: No Do you have to strain to urinate?: No Blood in urine?: No Urinary tract infection?: No Sexually transmitted disease?: No Injury to kidneys or bladder?: No Painful intercourse?: No Weak stream?: No Erection problems?: Yes Penile pain?: No  Gastrointestinal Nausea?: No Vomiting?: No Indigestion/heartburn?: No Diarrhea?: No Constipation?: No  Constitutional Fever: No Night sweats?: No Weight loss?: No Fatigue?: No  Skin Skin rash/lesions?: No Itching?: No  Eyes Blurred vision?: No Double vision?: No  Ears/Nose/Throat Sore throat?: No Sinus problems?: No  Hematologic/Lymphatic  Swollen glands?: No Easy bruising?: No  Cardiovascular Leg swelling?: No Chest pain?: No  Respiratory Cough?: No Shortness of breath?: No  Endocrine Excessive thirst?: No  Musculoskeletal Back pain?: No Joint pain?: No  Neurological Headaches?: No Dizziness?: No  Psychologic Depression?: No Anxiety?: No  Physical Exam: BP 119/75 (BP Location: Left Arm, Patient Position: Sitting, Cuff Size: Normal)   Pulse 86   Ht 6\' 2"  (1.88 m)   Wt 214 lb (97.1 kg)   BMI 27.48 kg/m   Constitutional:  Alert and oriented, No acute distress. HEENT: [place] AT, moist mucus membranes.  Trachea midline, no masses. Cardiovascular: No clubbing, cyanosis, or edema. Respiratory: Normal respiratory effort, no increased work of breathing. GU: Prostate 35 g, smooth  without nodules Skin: No rashes, bruises or suspicious lesions. Neurologic: Grossly intact, no focal deficits, moving all 4 extremities. Psychiatric: Normal mood and affect.   Assessment & Plan:    - Hypogonadism Stable symptoms on TRT.  Testosterone, PSA and hematocrit were drawn.  If stable will obtain a testosterone/hematocrit in 6 months and office visit in 1 year.  - Erectile dysfunction Unable to tolerate PDE 5 inhibitors.  Alternatives of intracavernosal injections and vacuum erection devices were discussed.  He was interested in pursuing intracavernosal injections.  He was given literature and will call back if he desires to set up injection training appointment.   Abbie Sons, [place] [address], [place], [place] [address] [phone]

## 2019-09-15 ENCOUNTER — Ambulatory Visit: Payer: No Typology Code available for payment source | Admitting: Gastroenterology

## 2019-09-15 LAB — HEMATOCRIT: Hematocrit: 40.1 % (ref 37.5–51.0)

## 2019-09-15 LAB — TESTOSTERONE: Testosterone: 246 ng/dL — ABNORMAL LOW (ref 264–916)

## 2019-09-15 LAB — PSA: Prostate Specific Ag, Serum: 0.3 ng/mL (ref 0.0–4.0)

## 2019-09-18 ENCOUNTER — Encounter: Payer: Self-pay | Admitting: Urology

## 2019-09-19 ENCOUNTER — Telehealth: Payer: Self-pay

## 2019-09-19 NOTE — Telephone Encounter (Signed)
See my chart message

## 2019-09-19 NOTE — Telephone Encounter (Signed)
-----   Message from Abbie Sons, MD sent at 09/18/2019  9:22 AM EDT ----- PSA stable at 0.3.  Hematocrit was normal.  His testosterone level was low at 246.  Depending on his symptoms we can increase his dose of AndroGel.

## 2019-09-27 ENCOUNTER — Encounter: Payer: Self-pay | Admitting: Gastroenterology

## 2019-09-27 ENCOUNTER — Ambulatory Visit: Payer: No Typology Code available for payment source | Admitting: Gastroenterology

## 2019-10-18 ENCOUNTER — Other Ambulatory Visit: Payer: Self-pay | Admitting: Gastroenterology

## 2019-10-18 DIAGNOSIS — R1013 Epigastric pain: Secondary | ICD-10-CM

## 2019-11-02 ENCOUNTER — Other Ambulatory Visit: Payer: Self-pay | Admitting: Family Medicine

## 2019-11-14 ENCOUNTER — Inpatient Hospital Stay: Payer: No Typology Code available for payment source | Attending: Internal Medicine

## 2019-11-21 ENCOUNTER — Inpatient Hospital Stay: Payer: No Typology Code available for payment source | Admitting: Internal Medicine

## 2019-11-21 ENCOUNTER — Other Ambulatory Visit: Payer: No Typology Code available for payment source

## 2019-11-21 NOTE — Assessment & Plan Note (Deleted)
#  Incidental mild hypogammaglobinemia [[facility] chart-low IgG/IgA/IgM]-however this seems to be clinically insignificant at this time-as patient denies any frequent or unusual infection episodes..  I would recommend reevaluation of immunoglobulins about 6 months  # Leg cramping/sweating/ extreme fatigue-unclear etiology.  Defer to neurology for their expertise  # Severe OSA- 20 years; repeat recent sleep study; awaiting titration of CPAP machine.  Thank you Dr.Shah for allowing me to participate in the care of your pleasant patient. Please do not hesitate to contact me with questions or concerns in the interim.  # DISPOSITION:  # follow up in 6 months-MD; labs [cbc/camp/Quantitative immunoglobulins]- 1 week prior- Dr.B

## 2019-11-21 NOTE — Progress Notes (Deleted)
[place] NOTE  Patient Care Team: [name], MD as PCP - General (Family Medicine)  CHIEF COMPLAINTS/PURPOSE OF CONSULTATION:  ***  #  Oncology History   No history exists.     HISTORY OF PRESENTING ILLNESS:  Daniel Calderon 53 y.o.  male    ROS   MEDICAL HISTORY:  Past Medical History:  Diagnosis Date  . Allergy    Seasonal  . Anxiety   . Benign enlargement of prostate   . Depression   . Elevated BP   . Elevated transaminase level   . Failure of erection   . Fatigue   . Frequent headaches   . GERD (gastroesophageal reflux disease)   . History of kidney stones   . Hypertension   . Hypogonadism in male   . Migraine   . Sinus congestion 09/01/2017  . Sleep apnea    Currently uses cpap  . Thoracic outlet syndrome    Left Arm  . Tongue pain 02/09/2019    SURGICAL HISTORY: Past Surgical History:  Procedure Laterality Date  . COLONOSCOPY WITH PROPOFOL N/A 11/11/2017   Procedure: COLONOSCOPY WITH PROPOFOL;  Surgeon: Lin Landsman, MD;  Location: [facility];  Service: Endoscopy;  Laterality: N/A;  . ESOPHAGOGASTRODUODENOSCOPY (EGD) WITH PROPOFOL N/A 08/25/2019   Procedure: ESOPHAGOGASTRODUODENOSCOPY (EGD) WITH PROPOFOL;  Surgeon: Lin Landsman, MD;  Location: [facility] ENDOSCOPY;  Service: Gastroenterology;  Laterality: N/A;  . KNEE ARTHROSCOPY WITH MEDIAL MENISECTOMY Left 07/26/2018   Procedure: KNEE ARTHROSCOPY WITH PARTIAL MEDIAL MENISECTOMY;  Surgeon: Leim Fabry, MD;  Location: [facility];  Service: Orthopedics;  Laterality: Left;  . Left Shoulder Surgery  2011  . nerve block     2 in neck. 5 in back.  . NOSE SURGERY    . POLYPECTOMY  11/11/2017   Procedure: POLYPECTOMY;  Surgeon: Lin Landsman, MD;  Location: [place];  Service: Endoscopy;;  . SCALENE NODE BIOPSY / EXCISION    . VASECTOMY      SOCIAL HISTORY: Social History   Socioeconomic History  . Marital status: Married    Spouse  name: Not on file  . Number of children: Not on file  . Years of education: Not on file  . Highest education level: Not on file  Occupational History  . Not on file  Social Needs  . Financial resource strain: Not on file  . Food insecurity    Worry: Not on file    Inability: Not on file  . Transportation needs    Medical: Not on file    Non-medical: Not on file  Tobacco Use  . Smoking status: Never Smoker  . Smokeless tobacco: Never Used  Substance and Sexual Activity  . Alcohol use: Yes    Alcohol/week: 3.0 standard drinks    Types: 3 Cans of beer per week  . Drug use: No  . Sexual activity: Yes    Partners: Female    Comment: Wife  Lifestyle  . Physical activity    Days per week: Not on file    Minutes per session: Not on file  . Stress: Not on file  Relationships  . Social Herbalist on phone: Not on file    Gets together: Not on file    Attends religious service: Not on file    Active member of club or organization: Not on file    Attends meetings of clubs or organizations: Not on file    Relationship  status: Not on file  . Intimate partner violence    Fear of current or ex partner: Not on file    Emotionally abused: Not on file    Physically abused: Not on file    Forced sexual activity: Not on file  Other Topics Concern  . 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 Hypertension Mother        Living  . Hearing loss Mother   . Heart disease Other   . Healthy Father        Living  . Diabetes Brother   . Stroke Paternal Uncle   . Heart attack Paternal Uncle   . Hearing loss Maternal Grandmother   . Healthy Son   . Healthy Daughter   . Kidney cancer Neg Hx   . Bladder Cancer Neg Hx   . Prostate cancer Neg Hx      ALLERGIES:  is allergic to erythromycin; cephalexin; dexamethasone; oxycodone-acetaminophen; prednisone; gabapentin; and ibuprofen.  MEDICATIONS:  Current Outpatient Medications  Medication Sig Dispense Refill  . amLODipine (NORVASC) 10 MG tablet Take 0.5 tablets (5 mg total) by mouth daily. 45 tablet 2  . calcipotriene (DOVONOX) 0.005 % cream Apply 1 application topically daily as needed (for psoriasis).    . cyanocobalamin (,VITAMIN B-12,) 1000 MCG/ML injection     . Multiple Vitamin (MULTIVITAMIN) tablet Take 1 tablet by mouth daily.    Marland Kitchen omeprazole (PRILOSEC) 40 MG capsule TAKE 1 CAPSULE BY MOUTH TWICE DAILY BEFORE A MEAL. 60 capsule 0  . sertraline (ZOLOFT) 100 MG tablet TAKE 1 TABLET BY MOUTH ONCE DAILY. 90 tablet 1  . tamsulosin (FLOMAX) 0.4 MG CAPS capsule Take 1 capsule (0.4 mg total) by mouth daily. 30 capsule 0  . testosterone (ANDROGEL) 50 MG/5GM (1%) GEL Place 5 g onto the skin daily. 30 Tube 3   No current facility-administered medications for this visit.       Marland Kitchen  PHYSICAL EXAMINATION: ECOG PERFORMANCE STATUS: {CHL ONC ECOG PS:[phone]}  There were no vitals filed for this visit. There were no vitals filed for this visit.  Physical Exam   LABORATORY DATA:  I have reviewed the data as listed Lab Results  Component Value Date   WBC 7.0 08/18/2019   HGB 13.1 08/18/2019   HCT 40.1 09/14/2019   MCV 79.0 (L) 08/18/2019   PLT 195 08/18/2019   Recent Labs    03/10/19 1523 03/21/19 1217 08/15/19 1618 08/18/19 1232  NA 141 142 140 140  K 4.4 4.2 3.8 4.0  CL 103 107 107 106  CO2 24 26 24 24   GLUCOSE 123* 105* 87 99  BUN 17 16 14 19   CREATININE 1.16 0.85 1.11 1.05  CALCIUM 9.5 9.7 9.7 9.5  GFRNONAA 73 >60 >60 >60  GFRAA 84 >60 >60 >60  PROT 6.5  --  7.1 7.2  ALBUMIN 4.8  --  5.0 4.6  AST 67*  --  53* 56*  ALT 111*  --  83* 83*  ALKPHOS 90  --  85 70  BILITOT 0.4  --  0.8 0.8    RADIOGRAPHIC STUDIES: I have personally reviewed the radiological  images as listed and agreed with  the findings in the report. No results found.  ASSESSMENT & PLAN:   No problem-specific Assessment & Plan notes found for this encounter.    All questions were answered. The patient knows to call the clinic with any problems, questions or concerns.       Cammie Sickle, MD 11/21/2019 7:08 AM

## 2019-11-24 ENCOUNTER — Telehealth: Payer: Self-pay | Admitting: Internal Medicine

## 2019-12-06 ENCOUNTER — Other Ambulatory Visit: Payer: Self-pay

## 2019-12-06 ENCOUNTER — Encounter (HOSPITAL_COMMUNITY): Payer: Self-pay | Admitting: Neurology

## 2019-12-06 ENCOUNTER — Inpatient Hospital Stay (HOSPITAL_COMMUNITY)
Admission: EM | Admit: 2019-12-06 | Discharge: 2019-12-07 | DRG: 552 | Disposition: A | Discharge status: Home / Self Care | Payer: No Typology Code available for payment source | Source: Other Acute Inpatient Hospital | Attending: Neurology | Admitting: Neurology

## 2019-12-06 ENCOUNTER — Inpatient Hospital Stay (HOSPITAL_COMMUNITY): Payer: No Typology Code available for payment source

## 2019-12-06 ENCOUNTER — Emergency Department
Admission: EM | Admit: 2019-12-06 | Discharge: 2019-12-06 | Disposition: A | Discharge status: Other Acute Inpatient Hospital | Payer: No Typology Code available for payment source | Attending: Emergency Medicine | Admitting: Emergency Medicine

## 2019-12-06 ENCOUNTER — Emergency Department: Payer: No Typology Code available for payment source

## 2019-12-06 DIAGNOSIS — G4733 Obstructive sleep apnea (adult) (pediatric): Secondary | ICD-10-CM | POA: Diagnosis present

## 2019-12-06 DIAGNOSIS — Z833 Family history of diabetes mellitus: Secondary | ICD-10-CM

## 2019-12-06 DIAGNOSIS — Z20828 Contact with and (suspected) exposure to other viral communicable diseases: Secondary | ICD-10-CM | POA: Insufficient documentation

## 2019-12-06 DIAGNOSIS — Z79899 Other long term (current) drug therapy: Secondary | ICD-10-CM | POA: Insufficient documentation

## 2019-12-06 DIAGNOSIS — Z8673 Personal history of transient ischemic attack (TIA), and cerebral infarction without residual deficits: Secondary | ICD-10-CM

## 2019-12-06 DIAGNOSIS — Z9282 Status post administration of tPA (rtPA) in a different facility within the last 24 hours prior to admission to current facility: Secondary | ICD-10-CM | POA: Diagnosis not present

## 2019-12-06 DIAGNOSIS — Z881 Allergy status to other antibiotic agents status: Secondary | ICD-10-CM

## 2019-12-06 DIAGNOSIS — R531 Weakness: Secondary | ICD-10-CM | POA: Diagnosis present

## 2019-12-06 DIAGNOSIS — Z823 Family history of stroke: Secondary | ICD-10-CM | POA: Diagnosis not present

## 2019-12-06 DIAGNOSIS — Z888 Allergy status to other drugs, medicaments and biological substances status: Secondary | ICD-10-CM | POA: Diagnosis not present

## 2019-12-06 DIAGNOSIS — R2990 Unspecified symptoms and signs involving the nervous system: Secondary | ICD-10-CM | POA: Diagnosis present

## 2019-12-06 DIAGNOSIS — Z886 Allergy status to analgesic agent status: Secondary | ICD-10-CM | POA: Diagnosis not present

## 2019-12-06 DIAGNOSIS — R079 Chest pain, unspecified: Secondary | ICD-10-CM | POA: Insufficient documentation

## 2019-12-06 DIAGNOSIS — M5410 Radiculopathy, site unspecified: Secondary | ICD-10-CM | POA: Diagnosis present

## 2019-12-06 DIAGNOSIS — Z885 Allergy status to narcotic agent status: Secondary | ICD-10-CM

## 2019-12-06 DIAGNOSIS — M5481 Occipital neuralgia: Secondary | ICD-10-CM | POA: Diagnosis not present

## 2019-12-06 DIAGNOSIS — I6389 Other cerebral infarction: Secondary | ICD-10-CM

## 2019-12-06 DIAGNOSIS — R29701 NIHSS score 1: Secondary | ICD-10-CM | POA: Diagnosis present

## 2019-12-06 DIAGNOSIS — I639 Cerebral infarction, unspecified: Secondary | ICD-10-CM

## 2019-12-06 DIAGNOSIS — G43909 Migraine, unspecified, not intractable, without status migrainosus: Secondary | ICD-10-CM | POA: Diagnosis present

## 2019-12-06 DIAGNOSIS — I10 Essential (primary) hypertension: Secondary | ICD-10-CM | POA: Diagnosis present

## 2019-12-06 DIAGNOSIS — Z8249 Family history of ischemic heart disease and other diseases of the circulatory system: Secondary | ICD-10-CM | POA: Diagnosis not present

## 2019-12-06 DIAGNOSIS — E785 Hyperlipidemia, unspecified: Secondary | ICD-10-CM | POA: Diagnosis present

## 2019-12-06 DIAGNOSIS — Z9989 Dependence on other enabling machines and devices: Secondary | ICD-10-CM | POA: Diagnosis not present

## 2019-12-06 DIAGNOSIS — R200 Anesthesia of skin: Secondary | ICD-10-CM | POA: Insufficient documentation

## 2019-12-06 DIAGNOSIS — E7800 Pure hypercholesterolemia, unspecified: Secondary | ICD-10-CM | POA: Diagnosis not present

## 2019-12-06 DIAGNOSIS — M5416 Radiculopathy, lumbar region: Secondary | ICD-10-CM | POA: Diagnosis present

## 2019-12-06 DIAGNOSIS — M5412 Radiculopathy, cervical region: Secondary | ICD-10-CM | POA: Diagnosis present

## 2019-12-06 DIAGNOSIS — F4323 Adjustment disorder with mixed anxiety and depressed mood: Secondary | ICD-10-CM | POA: Diagnosis present

## 2019-12-06 DIAGNOSIS — E291 Testicular hypofunction: Secondary | ICD-10-CM | POA: Diagnosis present

## 2019-12-06 DIAGNOSIS — R42 Dizziness and giddiness: Secondary | ICD-10-CM | POA: Diagnosis not present

## 2019-12-06 DIAGNOSIS — F329 Major depressive disorder, single episode, unspecified: Secondary | ICD-10-CM | POA: Diagnosis present

## 2019-12-06 LAB — URINE DRUG SCREEN, QUALITATIVE (ARMC ONLY)
Amphetamines, Ur Screen: NOT DETECTED
Barbiturates, Ur Screen: NOT DETECTED
Benzodiazepine, Ur Scrn: NOT DETECTED
Cannabinoid 50 Ng, Ur ~~LOC~~: NOT DETECTED
Cocaine Metabolite,Ur ~~LOC~~: NOT DETECTED
MDMA (Ecstasy)Ur Screen: NOT DETECTED
Methadone Scn, Ur: NOT DETECTED
Opiate, Ur Screen: NOT DETECTED
Phencyclidine (PCP) Ur S: NOT DETECTED
Tricyclic, Ur Screen: NOT DETECTED

## 2019-12-06 LAB — PROTIME-INR
INR: 1 (ref 0.8–1.2)
Prothrombin Time: 13.2 seconds (ref 11.4–15.2)

## 2019-12-06 LAB — DIFFERENTIAL
Abs Immature Granulocytes: 0.03 10*3/uL (ref 0.00–0.07)
Basophils Absolute: 0.1 10*3/uL (ref 0.0–0.1)
Basophils Relative: 1 %
Eosinophils Absolute: 0.2 10*3/uL (ref 0.0–0.5)
Eosinophils Relative: 2 %
Immature Granulocytes: 0 %
Lymphocytes Relative: 14 %
Lymphs Abs: 1.2 10*3/uL (ref 0.7–4.0)
Monocytes Absolute: 0.5 10*3/uL (ref 0.1–1.0)
Monocytes Relative: 6 %
Neutro Abs: 6.8 10*3/uL (ref 1.7–7.7)
Neutrophils Relative %: 77 %

## 2019-12-06 LAB — RESPIRATORY PANEL BY RT PCR (FLU A&B, COVID)
Influenza A by PCR: NEGATIVE
Influenza B by PCR: NEGATIVE
SARS Coronavirus 2 by RT PCR: NEGATIVE

## 2019-12-06 LAB — COMPREHENSIVE METABOLIC PANEL
ALT: 68 U/L — ABNORMAL HIGH (ref 0–44)
AST: 42 U/L — ABNORMAL HIGH (ref 15–41)
Albumin: 4.8 g/dL (ref 3.5–5.0)
Alkaline Phosphatase: 96 U/L (ref 38–126)
Anion gap: 9 (ref 5–15)
BUN: 22 mg/dL — ABNORMAL HIGH (ref 6–20)
CO2: 24 mmol/L (ref 22–32)
Calcium: 9.5 mg/dL (ref 8.9–10.3)
Chloride: 104 mmol/L (ref 98–111)
Creatinine, Ser: 1.08 mg/dL (ref 0.61–1.24)
GFR calc Af Amer: >60 mL/min (ref >60)
GFR calc non Af Amer: >60 mL/min (ref >60)
Glucose, Bld: 109 mg/dL — ABNORMAL HIGH (ref 70–99)
Potassium: 3.8 mmol/L (ref 3.5–5.1)
Sodium: 137 mmol/L (ref 135–145)
Total Bilirubin: 0.8 mg/dL (ref 0.3–1.2)
Total Protein: 7.3 g/dL (ref 6.5–8.1)

## 2019-12-06 LAB — CBC
HCT: 41 % (ref 39.0–52.0)
Hemoglobin: 13.6 g/dL (ref 13.0–17.0)
MCH: 27.4 pg (ref 26.0–34.0)
MCHC: 33.2 g/dL (ref 30.0–36.0)
MCV: 82.5 fL (ref 80.0–100.0)
Platelets: 192 10*3/uL (ref 150–400)
RBC: 4.97 MIL/uL (ref 4.22–5.81)
RDW: 13.9 % (ref 11.5–15.5)
WBC: 8.8 10*3/uL (ref 4.0–10.5)
nRBC: 0 % (ref 0.0–0.2)

## 2019-12-06 LAB — APTT: aPTT: 27 seconds (ref 24–36)

## 2019-12-06 LAB — ECHOCARDIOGRAM COMPLETE
Height: 74 in
Weight: 3262.81 oz

## 2019-12-06 LAB — LACTIC ACID, PLASMA: Lactic Acid, Venous: 1.3 mmol/L (ref 0.5–1.9)

## 2019-12-06 LAB — MRSA PCR SCREENING: MRSA by PCR: NEGATIVE

## 2019-12-06 LAB — HIV ANTIBODY (ROUTINE TESTING W REFLEX): HIV Screen 4th Generation wRfx: NONREACTIVE

## 2019-12-06 MED ORDER — ATORVASTATIN CALCIUM 40 MG PO TABS: 40.0000 mg | ORAL_TABLET | Freq: Every day | ORAL | Status: DC

## 2019-12-06 MED ORDER — CLEVIDIPINE BUTYRATE 0.5 MG/ML IV EMUL: 0.0000 mg/h | INTRAVENOUS | Status: DC

## 2019-12-06 MED ORDER — PANTOPRAZOLE SODIUM 40 MG IV SOLR
40.0000 mg | Freq: Every day | INTRAVENOUS | Status: DC
  Administered 2019-12-06: 40 mg via INTRAVENOUS
  Filled 2019-12-06: qty 40

## 2019-12-06 MED ORDER — IOHEXOL 350 MG/ML SOLN
75.0000 mL | Freq: Once | INTRAVENOUS | Status: AC | PRN
  Administered 2019-12-06: 75 mL via INTRAVENOUS

## 2019-12-06 MED ORDER — SODIUM CHLORIDE 0.9 % IV SOLN: INTRAVENOUS | Status: DC

## 2019-12-06 MED ORDER — SODIUM CHLORIDE 0.9% FLUSH: 3.0000 mL | Freq: Once | INTRAVENOUS | Status: DC

## 2019-12-06 MED ORDER — SERTRALINE HCL 50 MG PO TABS
100.0000 mg | ORAL_TABLET | Freq: Every day | ORAL | Status: DC
  Administered 2019-12-07: 100 mg via ORAL
  Filled 2019-12-06: qty 2

## 2019-12-06 MED ORDER — SODIUM CHLORIDE 0.9 % IV SOLN
50.0000 mL | Freq: Once | INTRAVENOUS | Status: AC
  Administered 2019-12-06: 14:00:00 50 mL via INTRAVENOUS

## 2019-12-06 MED ORDER — ONDANSETRON HCL 4 MG/2ML IJ SOLN
INTRAMUSCULAR | Status: AC
  Administered 2019-12-06: 4 mg
  Filled 2019-12-06: qty 2

## 2019-12-06 MED ORDER — TESTOSTERONE 50 MG/5GM (1%) TD GEL
5.0000 g | Freq: Every day | TRANSDERMAL | Status: DC
  Administered 2019-12-07: 10:00:00 5 g via TRANSDERMAL
  Filled 2019-12-06: qty 5

## 2019-12-06 MED ORDER — CHLORHEXIDINE GLUCONATE CLOTH 2 % EX PADS: 6.0000 | MEDICATED_PAD | Freq: Every day | CUTANEOUS | Status: DC

## 2019-12-06 MED ORDER — SENNOSIDES-DOCUSATE SODIUM 8.6-50 MG PO TABS: 1.0000 | ORAL_TABLET | Freq: Every evening | ORAL | Status: DC | PRN

## 2019-12-06 MED ORDER — STROKE: EARLY STAGES OF RECOVERY BOOK: Freq: Once | Status: AC

## 2019-12-06 MED ORDER — ALTEPLASE (STROKE) FULL DOSE INFUSION
0.9000 mg/kg | Freq: Once | INTRAVENOUS | Status: AC
  Administered 2019-12-06: 85.8 mg via INTRAVENOUS
  Filled 2019-12-06: qty 100

## 2019-12-06 MED ORDER — CYANOCOBALAMIN 1000 MCG/ML IJ SOLN: 1000.0000 ug | INTRAMUSCULAR | Status: DC

## 2019-12-06 NOTE — ED Provider Notes (Signed)
[facility] Emergency Department Provider Note ____________________________________________   First MD Initiated Contact with Patient 12/06/19 1148     (approximate)  I have reviewed the triage vital signs and the nursing notes.   HISTORY  Chief Complaint Code Stroke    HPI Daniel Calderon is a 53 y.o. 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  The patient was working when the symptoms happen.  He states that he suddenly had the weakness, and also had chest pain and felt weak and dizzy.  He was lowered to the ground and did not fall.  He states that the chest pain has now subsided, but he continues to have weakness and numbness to the left arm, left leg, and also somewhat to the left side of his face.  He denies any vision changes or difficulty speaking.  He states he has had this happen once before and was told it was a possible stroke.  Past Medical History:  Diagnosis Date  . Allergy    Seasonal  . Anxiety   . Benign enlargement of prostate   . Depression   . Elevated BP   . Elevated transaminase level   . Failure of erection   . Fatigue   . Frequent headaches   . GERD (gastroesophageal reflux disease)   . History of kidney stones   . Hypertension   . Hypogonadism in male   . Migraine   . Sinus congestion 09/01/2017  . Sleep apnea    Currently uses cpap  . Thoracic outlet syndrome    Left Arm  . Tongue pain 02/09/2019    Patient Active Problem List   Diagnosis Date Noted  . Abdominal pain, epigastric   . Left flank pain 08/15/2019  . Elevated LFTs 08/10/2019  . Hypogammaglobulinemia ([place]) 05/17/2019  . Excessive daytime sleepiness 03/28/2019  . Cataplexy 03/28/2019  . OSA on CPAP 03/28/2019  . Acute pain of left knee 04/18/2018  . Cutaneous skin tags 04/18/2018  . Lower extremity edema 04/18/2018  . Thoracic outlet syndrome  02/19/2018  . Pain in limb 02/19/2018  . Muscle strain 09/01/2017  . Chest pain 03/21/2017  . Dyspnea on exertion 02/19/2017  . Language difficulty 02/19/2017  . BPH with obstruction/lower urinary tract symptoms 12/03/2015  . Medication refill 09/13/2015  . Erectile disorder, generalized, mild 07/26/2015  . Hypogonadism in male 05/24/2015  . Environmental allergies 04/13/2015  . Sleep apnea 04/13/2015  . Migraines 04/13/2015  . Neck pain 04/13/2015  . Adjustment disorder with mixed anxiety and depressed mood 04/13/2015  . Psoriasis 04/13/2015  . History of kidney stones 04/13/2015  . Neuropathy 04/13/2015  . Benign fibroma of prostate 03/24/2015  . Hypertension 03/24/2015  . Elevation of level of transaminase or lactic acid dehydrogenase (LDH) 03/24/2015  . Fatigue 03/24/2015    Past Surgical History:  Procedure Laterality Date  . COLONOSCOPY WITH PROPOFOL N/A 11/11/2017   Procedure: COLONOSCOPY WITH PROPOFOL;  Surgeon: Lin Landsman, MD;  Location: [place];  Service: Endoscopy;  Laterality: N/A;  . ESOPHAGOGASTRODUODENOSCOPY (EGD) WITH PROPOFOL N/A 08/25/2019   Procedure: ESOPHAGOGASTRODUODENOSCOPY (EGD) WITH PROPOFOL;  Surgeon: Lin Landsman, MD;  [facility]: [facility] ENDOSCOPY;  Service: Gastroenterology;  Laterality: N/A;  . KNEE ARTHROSCOPY WITH MEDIAL MENISECTOMY Left 07/26/2018   Procedure: KNEE ARTHROSCOPY WITH PARTIAL MEDIAL MENISECTOMY;  Surgeon: Leim Fabry, MD;  Location: [facility];  Service: Orthopedics;  Laterality: Left;  . Left Shoulder Surgery  2011  . nerve block     2 in neck. 5 in back.  . NOSE SURGERY    . POLYPECTOMY  11/11/2017   Procedure: POLYPECTOMY;  Surgeon: Lin Landsman, MD;  Location: [place];  Service: Endoscopy;;  . SCALENE NODE BIOPSY / EXCISION    . VASECTOMY      Prior to Admission medications   Medication Sig Start Date End Date Taking? Authorizing Provider  amLODipine (NORVASC) 10 MG tablet Take 0.5  tablets (5 mg total) by mouth daily. 08/10/19  Yes [name], MD  cyanocobalamin (,VITAMIN B-12,) 1000 MCG/ML injection Inject 1,000 mcg into the muscle every 30 (thirty) days.  09/07/19  Yes [provider]  Multiple Vitamin (MULTIVITAMIN) tablet Take 1 tablet by mouth daily.   Yes [provider]  omeprazole (PRILOSEC) 40 MG capsule TAKE 1 CAPSULE BY MOUTH TWICE DAILY BEFORE A MEAL. 10/20/19  Yes Vanga, Tally Due, MD  sertraline (ZOLOFT) 100 MG tablet TAKE 1 TABLET BY MOUTH ONCE DAILY. 11/02/19  Yes [name], MD  testosterone (ANDROGEL) 50 MG/5GM (1%) GEL Place 5 g onto the skin daily. 06/02/19  Yes Stoioff, Ronda Fairly, MD  tamsulosin (FLOMAX) 0.4 MG CAPS capsule Take 1 capsule (0.4 mg total) by mouth daily. Patient not taking: Reported on 12/06/2019 08/15/19   [name], MD    Allergies Erythromycin, Cephalexin, Dexamethasone, Oxycodone-acetaminophen, Prednisone, Gabapentin, and Ibuprofen  Family History  Problem Relation Age of Onset  . Hypertension Mother        Living  . Hearing loss Mother   . Heart disease Other   . Healthy Father        Living  . Diabetes Brother   . Stroke Paternal Uncle   . Heart attack Paternal Uncle   . Hearing loss Maternal Grandmother   . Healthy Son   . Healthy Daughter   . Kidney cancer Neg Hx   . Bladder Cancer Neg Hx   . Prostate cancer Neg Hx     Social History Social History   Tobacco Use  . Smoking status: Never Smoker  . Smokeless tobacco: Never Used  Substance Use Topics  . Alcohol use: Yes    Alcohol/week: 3.0 standard drinks    Types: 3 Cans of beer per week  . Drug use: No    Review of Systems  Constitutional: No fever. Eyes: No visual changes. ENT: No neck pain. Cardiovascular: Denies chest pain. Respiratory: Denies shortness of breath. Gastrointestinal: No vomiting. Genitourinary: Negative for flank pain. Musculoskeletal: Negative for back pain. Skin: Negative for  rash. Neurological: Positive for left-sided weakness.   ____________________________________________   PHYSICAL EXAM:  VITAL SIGNS: ED Triage Vitals  Enc Vitals Group     BP 12/06/19 1202 (!) 145/87     Pulse Rate 12/06/19 1202 87     Resp 12/06/19 1202 18     Temp 12/06/19 1202 98.9 F (37.2 C)     Temp Source 12/06/19 1202 Oral     SpO2 12/06/19 1202 98 %     Weight 12/06/19 1206 210 lb (95.3 kg)     Height 12/06/19 1206 6\' 2"  (1.88 m)     Head Circumference --      Peak Flow --      Pain Score 12/06/19 1203 6     Pain Loc --      Pain Edu? --  Excl. in [place]? --     Constitutional: Alert but somewhat tired appearing.  No acute distress. Eyes: Conjunctivae are normal.  Head: Atraumatic. Nose: No congestion/rhinnorhea. Mouth/Throat: Mucous membranes are moist.   Neck: Normal range of motion.  Cardiovascular: Normal rate, regular rhythm. Grossly normal heart sounds.  Good peripheral circulation. Respiratory: Normal respiratory effort.  No retractions. Lungs CTAB. Gastrointestinal: Soft and nontender. No distention.  Genitourinary: No flank tenderness. Musculoskeletal: No lower extremity edema.  Extremities warm and well perfused.  Neurologic:  Normal speech and language.  Left upper extremity and left lower extremity weakness and motor drift.  No facial droop. Skin:  Skin is warm and dry. No rash noted. Psychiatric: Flat affect.  ____________________________________________   LABS (all labs ordered are listed, but only abnormal results are displayed)  Labs Reviewed  COMPREHENSIVE METABOLIC PANEL - Abnormal; Notable for the following components:      Result Value   Glucose, Bld 109 (*)    BUN 22 (*)    AST 42 (*)    ALT 68 (*)    All other components within normal limits  RESPIRATORY PANEL BY RT PCR (FLU A&B, COVID)  PROTIME-INR  APTT  CBC  DIFFERENTIAL  LACTIC ACID, PLASMA  URINE DRUG SCREEN, QUALITATIVE ([facility] ONLY)  ETHANOL  LACTIC ACID, PLASMA   I-STAT CREATININE, ED  CBG MONITORING, ED  TROPONIN I (HIGH SENSITIVITY)  TROPONIN I (HIGH SENSITIVITY)   ____________________________________________  EKG  ED ECG REPORT I, Arta Silence, the attending physician, personally viewed and interpreted this ECG.  Date: 12/06/2019 EKG Time: 1159 Rate: 79 Rhythm: normal sinus rhythm QRS Axis: normal Intervals: normal ST/T Wave abnormalities: normal Narrative Interpretation: no evidence of acute ischemia  ____________________________________________  RADIOLOGY  CT head: No acute abnormality  ____________________________________________   PROCEDURES  Procedure(s) performed: No  Procedures  Critical Care performed: Yes  CRITICAL CARE Performed by: Arta Silence   Total critical care time: 35 minutes  Critical care time was exclusive of separately billable procedures and treating other patients.  Critical care was necessary to treat or prevent imminent or life-threatening deterioration.  Critical care was time spent personally by me on the following activities: development of treatment plan with patient and/or surrogate as well as nursing, discussions with consultants, evaluation of patient's response to treatment, examination of patient, obtaining history from patient or surrogate, ordering and performing treatments and interventions, ordering and review of laboratory studies, ordering and review of radiographic studies, pulse oximetry and re-evaluation of patient's condition. ____________________________________________   INITIAL IMPRESSION / ASSESSMENT AND PLAN / ED COURSE  Pertinent labs & imaging results that were available during my care of the patient were reviewed by me and considered in my medical decision making (see chart for details).  53 year old male with PMH as noted above presents with acute onset of left upper and lower extremity weakness as well as generalized lightheadedness and some chest  pain.  The chest pain has resolved.  I reviewed the past medical records in [place].  The patient had an episode in 2019 which manifested with acute dizziness as well as left-sided weakness and numbness.  His symptoms resolved in the ED and he was ultimately diagnosed with vertigo.  On exam today, the vital signs are normal.  The patient is somewhat sleepy and tired appearing, and slow to respond to some questions.  However, when I repeated question or speak more loudly he does respond appropriately and is oriented x4.  The exam is notable for left upper  and lower extremity weakness and drift.  The patient is unable to hold either of these extremities up against gravity.  He has no facial droop but does report decreased sensation to the left side of his face.  The remainder of the exam is unremarkable.  Overall presentation is concerning for stroke.  Differential includes postictal state, Todd's paralysis, atypical migraine, or other benign etiology.  Although the patient did have chest pain, given the fact that his vital signs are normal, the pain was relatively mild, and it has now resolved, I do not suspect aortic dissection or other vascular etiology.  Code stroke was activated.  I consulted with Dr. Doy Mince who evaluated the patient in the emergency department.  She recommends tPA.  The patient will then receive a CT angiogram and will require admission.  ----------------------------------------- 1:57 PM on 12/06/2019 -----------------------------------------  CT angiogram is negative.  As there is no ICU bed availability at [facility], Dr. Doy Mince has arranged for transfer to [facility].  The accepting physician is Dr. Rory Percy.  The patient's vital signs and neurologic status are stable.  He is stable for transfer at this time.  ______________________________  [name] was evaluated in Emergency Department on 12/06/2019 for the symptoms described in the history of present illness. He was  evaluated in the context of the global COVID-19 pandemic, which necessitated consideration that the patient might be at risk for infection with the SARS-CoV-2 virus that causes COVID-19. Institutional protocols and algorithms that pertain to the evaluation of patients at risk for COVID-19 are in a state of rapid change based on information released by regulatory bodies including the CDC and federal and state organizations. These policies and algorithms were followed during the patient's care in the ED.  ____________________________________________   FINAL CLINICAL IMPRESSION(S) / ED DIAGNOSES  Final diagnoses:  Left-sided weakness      NEW MEDICATIONS STARTED DURING THIS VISIT:  New Prescriptions   No medications on file     Note:  This document was prepared using Dragon voice recognition software and may include unintentional dictation errors.    Arta Silence, MD 12/06/19 1358

## 2019-12-06 NOTE — ED Notes (Signed)
CODE STROKE CALLED TO [phone] 

## 2019-12-06 NOTE — ED Notes (Signed)
Pt taken to CT at this time by [facility] RN

## 2019-12-06 NOTE — Progress Notes (Signed)
  Echocardiogram 2D Echocardiogram has been performed.  Bobbye Charleston 12/06/2019, 4:44 PM

## 2019-12-06 NOTE — ED Triage Notes (Signed)
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 Pt was lowered to ground by RN and did not hit head or injure self.  Pt is aox4, nad noted.

## 2019-12-06 NOTE — Progress Notes (Signed)
   12/06/19 1200  Clinical Encounter Type  Visited With Patient;Health care provider  Visit Type Initial;Spiritual support  Referral From Physician  Consult/Referral To Chaplain  Spiritual Encounters  Spiritual Needs Prayer  Stress Factors  Patient Stress Factors Health changes;Other (Comment)  Chaplain received page for code stroke and went where patient was at the time (dialysis) care team was working with patient. Chaplain made pastoral presence known and maintain silent prayer until patient was transported to ED.

## 2019-12-06 NOTE — Code Documentation (Signed)
Pt arrives from work with left sided numbness and weakness, LKW 11:15, code stroke activated, after CT pt taken to room 12, CBG 118, initial NIHSS of 3 for left sided drift and sensory deficit, Dr. Doy Mince at bedside, pt consented to tPA, pharmacy at bedside to mix tPA, verified by RN Anda Kraft and Pharmacist Micheal and this RN, tPA started at 12:28, pt returned to Ct for CTA, report off to [place] and [employer]

## 2019-12-06 NOTE — H&P (Addendum)
Neurology history and physical  History is obtained from: Patient  HPI: Daniel Calderon is a 53 y.o. male with history of thoracic outlet syndrome, sleep apnea, migraine headaches, hypertension, fatigue, erectile dysfunction, depression, anxiety.  Patient presented to [facility] secondary to sudden onset of chest pain, left arm pain and left leg pain.  This was followed by a decrease sensation on the left side along with intermittent periods of left hand flexion.  Patient also states that his left arm and leg felt weak.  Patient was administered TPA at that time.  Patient was then transferred to [facility].  On consultation was con hospital patient states that he felt much stronger on the left side.  He also states that the left-sided decreased sensation has improved however he still is getting intermittent shocklike sensations that are going down his left arm and his left leg.  Currently NIH stroke scale is 0.   ED course CTA head neck, CT head, TPA administered-this was done at [facility]   Chart review  1.  Patient has been seen back on 02/19/2017 at that time for left-sided numbness, along with blurring of vision in left eye.  Symptoms are resolved by the time he got to the emergency department.  At that time was unclear if was a TIA versus migraine patient was not a TPA candidate at that time.  At that time MRI showed no evidence of acute changes, carotid Doppler showed no evidence of hemodynamically significant stenosis, LDL was 108.  Patient had follow-up with Dr. Tomi Likens on 05/01/2017.  At that time he states there is no explanation why his speech is slightly more disfluent when he addresses somebody to his left and suspected possible nonorganic etiology.  2.  Patient again was consulted in the hospital on 05/25/2018.  At this time chief complaint was left-sided numbness and difficulty with vision.  tPA was not given at that time.  MRI brain was negative and at that time  recommendations were to continue aspirin 81 mg daily.   Work up that has been done: CTA head neck, CT head, labs   LKW: 12/06/2019 at 1115 tpa given?:  Yes Premorbid modified Rankin scale (mRS):0 NIH stroke score: 1   Past Medical History:  Diagnosis Date  . Allergy    Seasonal  . Anxiety   . Benign enlargement of prostate   . Depression   . Elevated BP   . Elevated transaminase level   . Failure of erection   . Fatigue   . Frequent headaches   . GERD (gastroesophageal reflux disease)   . History of kidney stones   . Hypertension   . Hypogonadism in male   . Migraine   . Sinus congestion 09/01/2017  . Sleep apnea    Currently uses cpap  . Thoracic outlet syndrome    Left Arm  . Tongue pain 02/09/2019     Family History  Problem Relation Age of Onset  . Hypertension Mother        Living  . Hearing loss Mother   . Heart disease Other   . Healthy Father        Living  . Diabetes Brother   . Stroke Paternal Uncle   . Heart attack Paternal Uncle   . Hearing loss Maternal Grandmother   . Healthy Son   . Healthy Daughter   . Kidney cancer Neg Hx   . Bladder Cancer Neg Hx   . Prostate cancer Neg Hx  Social History:   reports that he has never smoked. He has never used smokeless tobacco. He reports current alcohol use of about 3.0 standard drinks of alcohol per week. He reports that he does not use drugs.  Medications  Current Facility-Administered Medications:  .   stroke: mapping our early stages of recovery book, , Does not apply, Once, Marliss Coots, PA-C .  0.9 %  sodium chloride infusion, , Intravenous, Continuous, Marliss Coots, PA-C .  Chlorhexidine Gluconate Cloth 2 % PADS 6 each, 6 each, Topical, Daily, Kalley Nicholl, Karena Addison R, MD .  clevidipine (CLEVIPREX) infusion 0.5 mg/mL, 0-21 mg/hr, Intravenous, Continuous, Marliss Coots, PA-C .  pantoprazole (PROTONIX) injection 40 mg, 40 mg, Intravenous, QHS, Marliss Coots, PA-C .  senna-docusate  (Senokot-S) tablet 1 tablet, 1 tablet, Oral, QHS PRN, Marliss Coots, PA-C   Exam: Current vital signs: BP (!) 154/98 (BP Location: Left Arm)   Pulse 69   Temp 98.5 F (36.9 C) (Oral)   Resp 16   Ht 6\' 2"  (1.88 m)   Wt 92.5 kg   SpO2 99%   BMI 26.18 kg/m  Vital signs in last 24 hours: Temp:  [98.5 F (36.9 C)-98.9 F (37.2 C)] 98.5 F (36.9 C) (12/15 1523) Pulse Rate:  [63-87] 69 (12/15 1600) Resp:  [13-23] 16 (12/15 1600) BP: (125-154)/(85-101) 154/98 (12/15 1600) SpO2:  [96 %-100 %] 99 % (12/15 1600) Weight:  [92.5 kg-95.3 kg] 92.5 kg (12/15 1548)  ROS:    General ROS: negative for - chills, fatigue, fever, night sweats, weight gain or weight loss Psychological ROS: negative for - behavioral disorder, hallucinations, memory difficulties, mood swings or suicidal ideation Ophthalmic ROS: negative for - blurry vision, double vision, eye pain or loss of vision ENT ROS: negative for - epistaxis, nasal discharge, oral lesions, sore throat, tinnitus or vertigo Respiratory ROS: negative for - cough, hemoptysis, shortness of breath or wheezing Cardiovascular ROS: Positive for -transient chest pain, Gastrointestinal ROS: negative for - abdominal pain, diarrhea, hematemesis, nausea/vomiting or stool incontinence Genito-Urinary ROS: negative for - dysuria, hematuria, incontinence or urinary frequency/urgency Musculoskeletal ROS: negative for - joint swelling or muscular weakness Neurological ROS: as noted in HPI Dermatological ROS: negative for rash and skin lesion changes   Physical Exam   Constitutional: Appears well-developed and well-nourished.  Psych: Affect appropriate to situation Eyes: No scleral injection HENT: No OP obstrucion Head: Normocephalic.  Cardiovascular: Normal rate and regular rhythm.  Respiratory: Effort normal, non-labored breathing GI: Soft.  No distension. There is no tenderness.  Skin: WDI  Neuro: Mental Status: Patient is awake, alert, oriented  to person, place, month, year, and situation. Patient is able to give a clear and coherent history. No signs of aphasia or neglect Cranial Nerves: II: On formal exam it appears that he has a left superior lateral hemianopsia III,IV, VI: EOMI with on left eye ptosis. Pupils equal, round and reactive to light V: Facial sensation is symmetric to temperature VII: Facial movement is symmetric.  VIII: hearing is intact to voice X: Palat elevates symmetrically XI: Shoulder shrug is symmetric. XII: tongue is midline without atrophy or fasciculations.  Motor: Tone is normal. Bulk is normal. 5/5 strength was present in extremities except for the left lower extremity which is 4/5 Sensory: Sensation is symmetric to light touch and temperature in the arms and legs. Deep Tendon Reflexes: 2+ and symmetric in the biceps and patellae.  Plantars: Toes are downgoing bilaterally.  Cerebellar: FNF and HKS are intact bilaterally  Labs I have reviewed labs in epic and the results pertinent to this consultation are:   CBC    Component Value Date/Time   WBC 8.8 12/06/2019 1145   RBC 4.97 12/06/2019 1145   HGB 13.6 12/06/2019 1145   HGB 14.8 03/01/2018 0957   HCT 41.0 12/06/2019 1145   HCT 40.1 09/14/2019 1338   PLT 192 12/06/2019 1145   MCV 82.5 12/06/2019 1145   MCH 27.4 12/06/2019 1145   MCHC 33.2 12/06/2019 1145   RDW 13.9 12/06/2019 1145   LYMPHSABS 1.2 12/06/2019 1145   MONOABS 0.5 12/06/2019 1145   EOSABS 0.2 12/06/2019 1145   BASOSABS 0.1 12/06/2019 1145    CMP     Component Value Date/Time   NA 137 12/06/2019 1145   NA 141 03/10/2019 1523   K 3.8 12/06/2019 1145   CL 104 12/06/2019 1145   CO2 24 12/06/2019 1145   GLUCOSE 109 (H) 12/06/2019 1145   BUN 22 (H) 12/06/2019 1145   BUN 17 03/10/2019 1523   CREATININE 1.08 12/06/2019 1145   CALCIUM 9.5 12/06/2019 1145   PROT 7.3 12/06/2019 1145   PROT 6.5 03/10/2019 1523   ALBUMIN 4.8 12/06/2019 1145   ALBUMIN 4.8 03/10/2019  1523   AST 42 (H) 12/06/2019 1145   ALT 68 (H) 12/06/2019 1145   ALKPHOS 96 12/06/2019 1145   BILITOT 0.8 12/06/2019 1145   BILITOT 0.4 03/10/2019 1523   GFRNONAA >60 12/06/2019 1145   GFRAA >60 12/06/2019 1145    Lipid Panel     Component Value Date/Time   CHOL 184 03/10/2019 1523   TRIG 245 (H) 03/10/2019 1523   HDL 40 03/10/2019 1523   CHOLHDL 4.6 03/10/2019 1523   CHOLHDL 5.1 02/20/2017 0815   VLDL 40 02/20/2017 0815   LDLCALC 95 03/10/2019 1523     Imaging I have reviewed the images obtained:  CT-scan of the brain-no acute intracranial hemorrhage, mass-effect, or evidence of acute infarction  CTA of head and neck-the common carotid, internal carotid and vertebral arteries are patent within the neck without significant stenosis.  No significant atherosclerotic disease  Etta Quill PA-C [facility] [phone]  M-F  (9:00 am- 5:00 PM)  12/06/2019, 4:10 PM     Assessment:  54 year old male with history of thoracic outlet syndrome, hypertension who was at work today and had an acute onset of left-sided weakness and numbness/pain.  Patient has had similar presentations in the past but when was evaluated at [facility] he said he is episodes were totally different.  There was a concern for acute ischemic event.  Patient states he is on no antiplatelets prior to presentation.  As above CT scan showed no acute changes and CTA of head and neck did not show any stenosis and were patent both intracranial and extracranially.  Due to sudden onset of symptoms TPA was administered.  Contraindications were discussed and reviewed with patient and patient agreed to accept TPA administration.  Suspected Acute ischemic stroke s/p TPA   Stroke risk factors-hypertension    Plan: 1. CTA of head and neck were performed and showed no evidence of large vessel occlusion.  tPA was administered at [facility].  Patient was then transferred to [facility] 2.   Hold aspirin until CT head tomorrow 3.  Keep blood pressure systolically less than 99991111 and diastolically less than [id number] 4.  Heart healthy diet as patient has passed swallow screen 5.  HbA1c, fasting lipid panel 6.  SCDs for DVT apophylactic  7.  Telemetry monitoring 8.  Frequent neuro checks 9.  Stroke order set has been placed.  Please call on-call neurologist with any questions   HTN-- Will address HTN with Cleviprex for now and patient is on Amlodipine as home med after Cleviprex has been stopped  Sleep Apnea: Will order CPAP machine and respiratory will adjust   Depression: - continue Zolof  Testosterone Defiencies - Continue home testosterone  Migraine: -Call if patient develops and will treat with migraine cocktail.      NEUROHOSPITALIST ADDENDUM Performed a face to face diagnostic evaluation as patient arrived to [facility], ICU.   I have reviewed the contents of history and physical exam as documented by PA/ARNP/Resident and agree with above documentation.  I have discussed and formulated the above plan as documented. Edits to the note have been made as needed.  53 year old male with past medical history of thoracic outlet syndrome, prior episodes of numbness, ? TIA presents to [facility] emergency department with sudden onset pain as well as numbness in his left arm and leg associated with weakness.  He has had similar presentation in the past but this time it was different and symptoms persisted.  Was evaluated by neurology at [facility] ED and received IV TPA for NIH stroke scale of 3. Transferred to [facility] as there is no ICU beds.  On my assessment, patient has slightly reduced finger tapping on the left hand compared to the right.  Mild lower extremity leg drift on the left side.  Complains of subjective paresthesias/burning sensation in the left foot, with states sensory is somewhat to light touch on both sides.  Symptoms possibly secondary to thalamic infarct.   Patient's risk factor he does include hypertension.  Other differentials include migraine versus cervical myelopathy.  MRI brain is pending.  Patient on Cleviprex for blood pressure goal less than 180/105 mmHg.  Patient is neurologically critically ill for possible stroke status post IV TPA.   CRITICAL CARE Performed by: Lanice Schwab Jasline Buskirk   Total critical care time: 35 minutes  Critical care time was exclusive of separately billable procedures and treating other patients.  Critical care was necessary to treat or prevent imminent or life-threatening deterioration.  Critical care was time spent personally by me on the following activities: development of treatment plan with patient and/or surrogate as well as nursing, discussions with consultants, evaluation of patient's response to treatment, examination of patient, obtaining history from patient or surrogate, ordering and performing treatments and interventions, ordering and review of laboratory studies, ordering and review of radiographic studies, pulse oximetry and re-evaluation of patient's condition.  Time spent is independent of time spent by PA     Karena Addison Zanya Lindo MD [facility] [phone]   If 7pm to 7am, please call on call as listed on AMION.

## 2019-12-06 NOTE — ED Notes (Signed)
Wife called at this time- update given.

## 2019-12-06 NOTE — Progress Notes (Addendum)
CODE STROKE- PHARMACY COMMUNICATION   Time CODE STROKE called/page received: @ 1120 and @ 1158  Time response to CODE STROKE was made (in person or via phone): in-person @ 1215  Time Stroke Kit retrieved from [employer] (only if needed): tPA kit and labetolol. tPA was mixed by Michael Simpson, Rph  Name of Provider/Nurse contacted: Olivia Broomer, RN  Past Medical History:  Diagnosis Date  . Allergy    Seasonal  . Anxiety   . Benign enlargement of prostate   . Depression   . Elevated BP   . Elevated transaminase level   . Failure of erection   . Fatigue   . Frequent headaches   . GERD (gastroesophageal reflux disease)   . History of kidney stones   . Hypertension   . Hypogonadism in male   . Migraine   . Sinus congestion 09/01/2017  . Sleep apnea    Currently uses cpap  . Thoracic outlet syndrome    Left Arm  . Tongue pain 02/09/2019   Prior to Admission medications   Medication Sig Start Date End Date Taking? Authorizing Provider  amLODipine (NORVASC) 10 MG tablet Take 0.5 tablets (5 mg total) by mouth daily. 08/10/19  Yes Sonnenberg, Eric G, MD  cyanocobalamin (,VITAMIN B-12,) 1000 MCG/ML injection Inject 1,000 mcg into the muscle every 30 (thirty) days.  09/07/19  Yes [provider]  Multiple Vitamin (MULTIVITAMIN) tablet Take 1 tablet by mouth daily.   Yes [provider]  omeprazole (PRILOSEC) 40 MG capsule TAKE 1 CAPSULE BY MOUTH TWICE DAILY BEFORE A MEAL. 10/20/19  Yes Vanga, Rohini Reddy, MD  sertraline (ZOLOFT) 100 MG tablet TAKE 1 TABLET BY MOUTH ONCE DAILY. 11/02/19  Yes Sonnenberg, Eric G, MD  testosterone (ANDROGEL) 50 MG/5GM (1%) GEL Place 5 g onto the skin daily. 06/02/19  Yes Stoioff, Scott C, MD  tamsulosin (FLOMAX) 0.4 MG CAPS capsule Take 1 capsule (0.4 mg total) by mouth daily. Patient not taking: Reported on 12/06/2019 08/15/19   Sonnenberg, Eric G, MD  calcipotriene (DOVONOX) 0.005 % cream Apply 1 application topically daily as needed (for  psoriasis).  12/06/19  [provider]     R  ,PharmD Clinical Pharmacist  12/06/2019  1:17 PM   

## 2019-12-06 NOTE — ED Notes (Signed)
Family at bedside. 

## 2019-12-06 NOTE — Consult Note (Addendum)
Referring Physician: Siadecki    Chief Complaint: Left sided weakness  HPI: Daniel Calderon is an 53 y.o. male with a history of thoracic outlet syndrome, HTN who was at work today and when reaching up had acute onset of chest pain.  Then noted left sided weakness and numbness.  Patient was brought to the ED where a code stroke was called.  Initial NIHSS of 3.    Date last known well: Date: 12/06/2019 Time last known well: Time: 11:15 tPA Given: Yes  Past Medical History:  Diagnosis Date  . Allergy    Seasonal  . Anxiety   . Benign enlargement of prostate   . Depression   . Elevated BP   . Elevated transaminase level   . Failure of erection   . Fatigue   . Frequent headaches   . GERD (gastroesophageal reflux disease)   . History of kidney stones   . Hypertension   . Hypogonadism in male   . Migraine   . Sinus congestion 09/01/2017  . Sleep apnea    Currently uses cpap  . Thoracic outlet syndrome    Left Arm  . Tongue pain 02/09/2019    Past Surgical History:  Procedure Laterality Date  . COLONOSCOPY WITH PROPOFOL N/A 11/11/2017   Procedure: COLONOSCOPY WITH PROPOFOL;  Surgeon: Lin Landsman, MD;  Location: [facility];  Service: Endoscopy;  Laterality: N/A;  . ESOPHAGOGASTRODUODENOSCOPY (EGD) WITH PROPOFOL N/A 08/25/2019   Procedure: ESOPHAGOGASTRODUODENOSCOPY (EGD) WITH PROPOFOL;  Surgeon: Lin Landsman, MD;  Location: [facility] ENDOSCOPY;  Service: Gastroenterology;  Laterality: N/A;  . KNEE ARTHROSCOPY WITH MEDIAL MENISECTOMY Left 07/26/2018   Procedure: KNEE ARTHROSCOPY WITH PARTIAL MEDIAL MENISECTOMY;  Surgeon: Leim Fabry, MD;  Location: [facility];  Service: Orthopedics;  Laterality: Left;  . Left Shoulder Surgery  2011  . nerve block     2 in neck. 5 in back.  . NOSE SURGERY    . POLYPECTOMY  11/11/2017   Procedure: POLYPECTOMY;  Surgeon: Lin Landsman, MD;  Location: [facility];  Service: Endoscopy;;  . SCALENE NODE BIOPSY / EXCISION     . VASECTOMY      Family History  Problem Relation Age of Onset  . Hypertension Mother        Living  . Hearing loss Mother   . Heart disease Other   . Healthy Father        Living  . Diabetes Brother   . Stroke Paternal Uncle   . Heart attack Paternal Uncle   . Hearing loss Maternal Grandmother   . Healthy Son   . Healthy Daughter   . Kidney cancer Neg Hx   . Bladder Cancer Neg Hx   . Prostate cancer Neg Hx    Social History:  reports that he has never smoked. He has never used smokeless tobacco. He reports current alcohol use of about 3.0 standard drinks of alcohol per week. He reports that he does not use drugs.  Allergies:  Allergies  Allergen Reactions  . Erythromycin Nausea And Vomiting  . Cephalexin Other (See Comments)    Unknown  . Dexamethasone Other (See Comments)    Unknown  . Oxycodone-Acetaminophen Other (See Comments)    Unknown  . Prednisone Other (See Comments)    Unknown  . Gabapentin Other (See Comments)    Aggressive  . Ibuprofen Itching and Other (See Comments)    Can take in low doses per pt    Medications:  I have  reviewed the patient's current medications. Prior to Admission:  Prior to Admission medications   Medication Sig Start Date End Date Taking? Authorizing Provider  amLODipine (NORVASC) 10 MG tablet Take 0.5 tablets (5 mg total) by mouth daily. 08/10/19  Yes [name], MD  cyanocobalamin (,VITAMIN B-12,) 1000 MCG/ML injection Inject 1,000 mcg into the muscle every 30 (thirty) days.  09/07/19  Yes [provider]  Multiple Vitamin (MULTIVITAMIN) tablet Take 1 tablet by mouth daily.   Yes [provider]  omeprazole (PRILOSEC) 40 MG capsule TAKE 1 CAPSULE BY MOUTH TWICE DAILY BEFORE A MEAL. 10/20/19  Yes Vanga, Tally Due, MD  sertraline (ZOLOFT) 100 MG tablet TAKE 1 TABLET BY MOUTH ONCE DAILY. 11/02/19  Yes [name], MD  testosterone (ANDROGEL) 50 MG/5GM (1%) GEL Place 5 g onto the skin daily.  06/02/19  Yes Stoioff, Ronda Fairly, MD  tamsulosin (FLOMAX) 0.4 MG CAPS capsule Take 1 capsule (0.4 mg total) by mouth daily. Patient not taking: Reported on 12/06/2019 08/15/19   [name], MD     Scheduled: . sodium chloride flush  3 mL Intravenous Once    ROS: History obtained from the patient  General ROS: fatigue Psychological ROS: negative for - behavioral disorder, hallucinations, memory difficulties, mood swings or suicidal ideation Ophthalmic ROS: negative for - blurry vision, double vision, eye pain or loss of vision ENT ROS: negative for - epistaxis, nasal discharge, oral lesions, sore throat, tinnitus or vertigo Allergy and Immunology ROS: negative for - hives or itchy/watery eyes Hematological and Lymphatic ROS: negative for - bleeding problems, bruising or swollen lymph nodes Endocrine ROS: negative for - galactorrhea, hair pattern changes, polydipsia/polyuria or temperature intolerance Respiratory ROS: negative for - cough, hemoptysis, shortness of breath or wheezing Cardiovascular ROS: negative for - chest pain, dyspnea on exertion, edema or irregular heartbeat Gastrointestinal ROS: nausea Genito-Urinary ROS: negative for - dysuria, hematuria, incontinence or urinary frequency/urgency Musculoskeletal ROS: negative for - joint swelling or muscular weakness Neurological ROS: as noted in HPI Dermatological ROS: negative for rash and skin lesion changes  Physical Examination: Blood pressure (!) 144/89, pulse 79, temperature 98.9 F (37.2 C), temperature source Oral, resp. rate 18, height 6\' 2"  (1.88 m), weight 92.5 kg, SpO2 100 %.  HEENT-  Normocephalic, no lesions, without obvious abnormality.  Normal external eye and conjunctiva.  Normal TM's bilaterally.  Normal auditory canals and external ears. Normal external nose, mucus membranes and septum.  Normal pharynx. Cardiovascular- S1, S2 normal, pulses palpable throughout   Lungs- chest clear, no wheezing, rales,  normal symmetric air entry Abdomen- soft, non-tender; bowel sounds normal; no masses,  no organomegaly Extremities- no edema Lymph-no adenopathy palpable Musculoskeletal-no joint tenderness, deformity or swelling Skin-warm and dry, no hyperpigmentation, vitiligo, or suspicious lesions  Neurological Examination   Mental Status: Alert, oriented, thought content appropriate.  Speech fluent without evidence of aphasia.  Able to follow 3 step commands without difficulty. Cranial Nerves: II: Discs flat bilaterally; Visual fields grossly normal, pupils equal, round, reactive to light and accommodation III,IV, VI: ptosis not present, extra-ocular motions intact bilaterally V,VII: smile symmetric, facial light touch sensation normal bilaterally VIII: hearing normal bilaterally IX,X: gag reflex present XI: bilateral shoulder shrug XII: midline tongue extension Motor: Right : Upper extremity   5/5    Left:     Upper extremity   4/5  Lower extremity   5/5     Lower extremity   4-/5 Tone and bulk:normal tone throughout; no atrophy noted Sensory:  Pinprick and light touch decreased on the left Deep Tendon Reflexes: Symmetric throughout Plantars: Right: mute   Left: mute Cerebellar: Normal finger-to-nose and normal heel-to-shin testing bilaterally Gait: not tested due to safety concerns    Laboratory Studies:  Basic Metabolic Panel: Recent Labs  Lab 12/06/19 1145  NA 137  K 3.8  CL 104  CO2 24  GLUCOSE 109*  BUN 22*  CREATININE 1.08  CALCIUM 9.5    Liver Function Tests: Recent Labs  Lab 12/06/19 1145  AST 42*  ALT 68*  ALKPHOS 96  BILITOT 0.8  PROT 7.3  ALBUMIN 4.8   No results for input(s): LIPASE, AMYLASE in the last 168 hours. No results for input(s): AMMONIA in the last 168 hours.  CBC: Recent Labs  Lab 12/06/19 1145  WBC 8.8  NEUTROABS 6.8  HGB 13.6  HCT 41.0  MCV 82.5  PLT 192    Cardiac Enzymes: No results for input(s): CKTOTAL, CKMB, CKMBINDEX,  TROPONINI in the last 168 hours.  BNP: Invalid input(s): POCBNP  CBG: No results for input(s): GLUCAP in the last 168 hours.  Microbiology: Results for orders placed or performed during the hospital encounter of 08/22/19  SARS CORONAVIRUS 2 (TAT 6-24 HRS) Nasopharyngeal Nasopharyngeal Swab     Status: None   Collection Time: 08/22/19 11:36 AM   Specimen: Nasopharyngeal Swab  Result Value Ref Range Status   SARS Coronavirus 2 NEGATIVE NEGATIVE Final    Comment: (NOTE) SARS-CoV-2 target nucleic acids are NOT DETECTED. The SARS-CoV-2 RNA is generally detectable in upper and lower respiratory specimens during the acute phase of infection. Negative results do not preclude SARS-CoV-2 infection, do not rule out co-infections with other pathogens, and should not be used as the sole basis for treatment or other patient management decisions. Negative results must be combined with clinical observations, patient history, and epidemiological information. The expected result is Negative. Fact Sheet for Patients: SugarRoll.be Fact Sheet for Healthcare Providers: https://www.woods-mathews.com/ This test is not yet approved or cleared by the [place] FDA and  has been authorized for detection and/or diagnosis of SARS-CoV-2 by FDA under an Emergency Use Authorization (EUA). This EUA will remain  in effect (meaning this test can be used) for the duration of the COVID-19 declaration under Section 56 4(b)(1) of the Act, 21 U.S.C. section 360bbb-3(b)(1), unless the authorization is terminated or revoked sooner. Performed at [facility], [place] [address], [place], [place] [address]     Coagulation Studies: Recent Labs    12/06/19 1145  LABPROT 13.2  INR 1.0    Urinalysis: No results for input(s): COLORURINE, LABSPEC, PHURINE, GLUCOSEU, HGBUR, BILIRUBINUR, KETONESUR, PROTEINUR, UROBILINOGEN, NITRITE, LEUKOCYTESUR in the last 168  hours.  Invalid input(s): APPERANCEUR  Lipid Panel:    Component Value Date/Time   CHOL 184 03/10/2019 1523   TRIG 245 (H) 03/10/2019 1523   HDL 40 03/10/2019 1523   CHOLHDL 4.6 03/10/2019 1523   CHOLHDL 5.1 02/20/2017 0815   VLDL 40 02/20/2017 0815   LDLCALC 95 03/10/2019 1523    HgbA1C:  Lab Results  Component Value Date   HGBA1C 5.4 08/15/2019    Urine Drug Screen:      Component Value Date/Time   LABOPIA NONE DETECTED 02/19/2017 1020   COCAINSCRNUR NONE DETECTED 02/19/2017 1020   LABBENZ NONE DETECTED 02/19/2017 1020   AMPHETMU NONE DETECTED 02/19/2017 1020   THCU NONE DETECTED 02/19/2017 1020   LABBARB NONE DETECTED 02/19/2017 1020    Alcohol Level: No results for input(s):  ETH in the last 168 hours.  Other results: EKG: sinus rhythm at 79 bpm.  Imaging: CT HEAD CODE STROKE WO CONTRAST  Result Date: 12/06/2019 CLINICAL DATA:  Code stroke. EXAM: CT HEAD WITHOUT CONTRAST TECHNIQUE: Contiguous axial images were obtained from the base of the skull through the vertex without intravenous contrast. COMPARISON:  None. FINDINGS: Brain: There is no acute intracranial hemorrhage, mass effect, or edema. Gray-white differentiation remains preserved. Ventricles and sulci are normal in size and configuration. There is no extra-axial fluid collection Vascular: No definite hyperdense vessel. Skull: Unremarkable. Sinuses/Orbits: Primarily ethmoid sinus mucosal thickening. Orbits are unremarkable. Other: Included mastoid air cells are clear. ASPECTS [facility] Stroke Program Early CT Score) - Ganglionic level infarction (caudate, lentiform nuclei, internal capsule, insula, M1-M3 cortex): 7 - Supraganglionic infarction (M4-M6 cortex): 3 Total score (0-10 with 10 being normal): 10 IMPRESSION: 1. No acute intracranial hemorrhage, mass effect, or evidence of acute infarction. 2. ASPECTS is 10 These results were called by telephone at the time of interpretation on 12/06/2019 at 12:03 pm to  provider [facility] , who verbally acknowledged these results. Electronically Signed   By: Macy Mis M.D.   On: 12/06/2019 12:06    Assessment: 53 y.o. male with a history of thoracic outlet syndrome, HTN who was at work today and had acute onset left sided weakness and numbness.  Patient has had similar presentations in the past but he reports this episode is totally different.  Concern is for an acute ischemic event.  Patient on no antiplatelet therapy prior to presentation.  Head CT reviewed and shows no acute changes.  Risks and benefits of tPA administration discussed.  Contraindications reviewed.  Verbal consent obtained and tPA administered.    Stroke Risk Factors - hypertension  Plan: 1. CTA of the head and neck to be performed STAT.  If no evidence of large vessel occlusion patient to be admitted to the ICU.  Otherwise transfer will be required for possible intervention.   2. MRI of the brain without contrast 3. PT consult, OT consult, Speech consult 4. Echocardiogram 5. HgbA1c, fasting lipid panel 6. Prophylactic therapy-None 7. NPO until RN stroke swallow screen 8. Telemetry monitoring 9. Frequent neuro checks 10. BP parameters as per order set.     Alexis Goodell, MD Neurology [phone] 12/06/2019, 12:41 PM  Addendum: CTA shows no evidence of large vessel occlusion but not ICU beds are available at [facility].  Will attempt transfer to an outside facility.  Case discussed with Dr.Siadecki.  This patient is critically ill and at significant risk of neurological worsening, death and care requires constant monitoring of vital signs, hemodynamics,respiratory and cardiac monitoring, neurological assessment, discussion with family, other specialists and medical decision making of high complexity. I spent 60 minutes of neurocritical care time  in the care of  this patient.  Alexis Goodell, MD Neurology [phone] 12/06/2019  1:16 PM

## 2019-12-06 NOTE — ED Notes (Signed)
Pt transported to room from CT at this time. HOB at 30 degrees.

## 2019-12-07 ENCOUNTER — Inpatient Hospital Stay (HOSPITAL_COMMUNITY): Payer: No Typology Code available for payment source

## 2019-12-07 DIAGNOSIS — I10 Essential (primary) hypertension: Secondary | ICD-10-CM

## 2019-12-07 DIAGNOSIS — E785 Hyperlipidemia, unspecified: Secondary | ICD-10-CM | POA: Diagnosis present

## 2019-12-07 DIAGNOSIS — M5412 Radiculopathy, cervical region: Secondary | ICD-10-CM

## 2019-12-07 DIAGNOSIS — R2990 Unspecified symptoms and signs involving the nervous system: Secondary | ICD-10-CM | POA: Diagnosis present

## 2019-12-07 DIAGNOSIS — Z9989 Dependence on other enabling machines and devices: Secondary | ICD-10-CM

## 2019-12-07 DIAGNOSIS — M5416 Radiculopathy, lumbar region: Principal | ICD-10-CM

## 2019-12-07 DIAGNOSIS — G4733 Obstructive sleep apnea (adult) (pediatric): Secondary | ICD-10-CM

## 2019-12-07 DIAGNOSIS — Z9282 Status post administration of tPA (rtPA) in a different facility within the last 24 hours prior to admission to current facility: Secondary | ICD-10-CM

## 2019-12-07 DIAGNOSIS — M5481 Occipital neuralgia: Secondary | ICD-10-CM

## 2019-12-07 DIAGNOSIS — E7800 Pure hypercholesterolemia, unspecified: Secondary | ICD-10-CM

## 2019-12-07 LAB — LIPID PANEL
Cholesterol: 201 mg/dL — ABNORMAL HIGH (ref 0–200)
HDL: 44 mg/dL (ref >40)
LDL Cholesterol: 137 mg/dL — ABNORMAL HIGH (ref 0–99)
Total CHOL/HDL Ratio: 4.6 RATIO
Triglycerides: 100 mg/dL (ref <150)
VLDL: 20 mg/dL (ref 0–40)

## 2019-12-07 LAB — HEMOGLOBIN A1C
Hgb A1c MFr Bld: 5.6 % (ref 4.8–5.6)
Mean Plasma Glucose: 114.02 mg/dL

## 2019-12-07 MED ORDER — ATORVASTATIN CALCIUM 40 MG PO TABS: 40.0000 mg | ORAL_TABLET | Freq: Every day | ORAL | 2 refills | Status: DC

## 2019-12-07 NOTE — Evaluation (Signed)
Physical Therapy Evaluation Patient Details Name: Daniel Calderon MRN: [id number] DOB: April 12, 1966 Today's Date: 12/07/2019   History of Present Illness  ENOC ESTORGA is a 53 y.o. male with history of thoracic outlet syndrome, sleep apnea, migraine headaches, hypertension, fatigue, erectile dysfunction, depression, anxiety.  Patient presented to [facility] secondary to sudden onset of chest pain, left arm pain and left leg pain.  This was followed by a decrease sensation on the left side along with intermittent periods of left hand flexion.  Patient also states that his left arm and leg felt weak.  Patient was administered TPA at that time.  Clinical Impression  Pt is at or close to baseline functioning and should be safe at home with PRN assist from wife . There are no further acute PT needs.  Will sign off at this time.     Follow Up Recommendations No PT follow up    Equipment Recommendations  None recommended by PT    Recommendations for Other Services       Precautions / Restrictions Precautions Precautions: None      Mobility  Bed Mobility Overal bed mobility: Modified Independent                Transfers Overall transfer level: Modified independent                  Ambulation/Gait Ambulation/Gait assistance: Modified independent (Device/Increase time) Gait Distance (Feet): 350 Feet Assistive device: None Gait Pattern/deviations: Step-through pattern Gait velocity: age appropriate. Gait velocity interpretation: >2.62 ft/sec, indicative of community ambulatory General Gait Details: steady, quality heel/toe pattern.  Pt able to make abrupt directional changes, start/stops and turns without deviation.  No LOB.  Stairs Stairs: Yes Stairs assistance: Supervision Stair Management: No rails;One rail Right;Alternating pattern;Forwards(rail used going down) Number of Stairs: 12 General stair comments: pt appropriately used rail going down.  Safe  with Lobbyist Rankin (Stroke Patients Only)       Balance Overall balance assessment: Modified Independent                               Standardized Balance Assessment Standardized Balance Assessment : Berg Balance Test Berg Balance Test Sit to Stand: Able to stand without using hands and stabilize independently Standing Unsupported: Able to stand safely 2 minutes Sitting with Back Unsupported but Feet Supported on Floor or Stool: Able to sit safely and securely 2 minutes Stand to Sit: Sits safely with minimal use of hands Transfers: Able to transfer safely, minor use of hands Standing Unsupported with Eyes Closed: Able to stand 10 seconds safely Standing Ubsupported with Feet Together: Able to place feet together independently and stand 1 minute safely From Standing Position, Pick up Object from Floor: Able to pick up shoe safely and easily From Standing Position, Turn to Look Behind Over each Shoulder: Looks behind from both sides and weight shifts well Turn 360 Degrees: Able to turn 360 degrees safely in 4 seconds or less         Pertinent Vitals/Pain Pain Assessment: Faces Faces Pain Scale: Hurts little more Pain Location: head ache Pain Descriptors / Indicators: Aching;Discomfort;Grimacing Pain Intervention(s): Monitored during session    Home Living Family/patient expects to be discharged to:: Private residence Living Arrangements: Spouse/significant other Available Help at Discharge: Family;Available 24 hours/day Type of Home: House Home Access: Stairs to enter Entrance Stairs-Rails: Right;Left Entrance Stairs-Number of Steps:  several Home Layout: One level Home Equipment: None      Prior Function Level of Independence: Independent         Comments: works in maintenance. drives     Hand Dominance        Extremity/Trunk Assessment   Upper Extremity Assessment Upper Extremity Assessment: Defer to OT  evaluation    Lower Extremity Assessment Lower Extremity Assessment: LLE deficits/detail;RLE deficits/detail RLE Deficits / Details: WFL, gross strength appears weaker than isolated testing., LLE Deficits / Details: WFL, mildly weaker than R LE, gross extension appears >=4/5       Communication   Communication: No difficulties  Cognition Arousal/Alertness: Awake/alert Behavior During Therapy: WFL for tasks assessed/performed Overall Cognitive Status: Within Functional Limits for tasks assessed                                        General Comments      Exercises     Assessment/Plan    PT Assessment Patent does not need any further PT services  PT Problem List         PT Treatment Interventions      PT Goals (Current goals can be found in the Care Plan section)  Acute Rehab PT Goals PT Goal Formulation: All assessment and education complete, DC therapy    Frequency     Barriers to discharge        Co-evaluation               AM-PAC PT "6 Clicks" Mobility  Outcome Measure Help needed turning from your back to your side while in a flat bed without using bedrails?: None Help needed moving from lying on your back to sitting on the side of a flat bed without using bedrails?: None Help needed moving to and from a bed to a chair (including a wheelchair)?: None Help needed standing up from a chair using your arms (e.g., wheelchair or bedside chair)?: None Help needed to walk in hospital room?: None Help needed climbing 3-5 steps with a railing? : None 6 Click Score: 24    End of Session   Activity Tolerance: Patient tolerated treatment well Patient left: in bed;with call bell/phone within reach;with family/visitor present Nurse Communication: Mobility status PT Visit Diagnosis: Other abnormalities of gait and mobility (R26.89);Pain;Other symptoms and signs involving the nervous system (R29.898) Pain - part of body: (head)    Time:  [id number] PT Time Calculation (min) (ACUTE ONLY): 22 min   Charges:   PT Evaluation $PT Eval Moderate Complexity: 1 Mod          12/07/2019  Ginger Carne., PT Acute Rehabilitation Services [phone]  (pager) [phone]  (office)  Tessie Fass Shaquanda Graves 12/07/2019, 2:09 PM

## 2019-12-07 NOTE — Progress Notes (Addendum)
STROKE TEAM PROGRESS NOTE   INTERVAL HISTORY Pt RN at bedside. Pt recounted HPI with me. He was trying to reach things with left arm yesterday, suddenly had left arm cramping, pain, associated with numbness. He then got chest muscle tightness, chest pain, left leg numbness and gave away on him. He fell to the nearby table. He received tPA. Symptoms resolved in 4-5 hours. He has no pain or numbness on the left today but still feel not normal feeling.   He has long standing neck pain and lower back pain, received multiple injections in the past. He has frequent left back of head pain, likely occipital neuralgia. Today he started that again this am. Otherwise, he feels at his baseline.   Vitals:   12/07/19 0500 12/07/19 0600 12/07/19 0700 12/07/19 0800  BP: 121/88 124/88 (!) 136/95 132/88  Pulse: 80 63 72 63  Resp: 16 16 19 17   Temp:    98.3 F (36.8 C)  TempSrc:    Oral  SpO2: 98% 98% 98% 96%  Weight:      Height:        CBC:  Recent Labs  Lab 12/06/19 1145  WBC 8.8  NEUTROABS 6.8  HGB 13.6  HCT 41.0  MCV 82.5  PLT [id number]    Basic Metabolic Panel:  Recent Labs  Lab 12/06/19 1145  NA 137  K 3.8  CL 104  CO2 24  GLUCOSE 109*  BUN 22*  CREATININE 1.08  CALCIUM 9.5   Lipid Panel:     Component Value Date/Time   CHOL 201 (H) 12/07/2019 0434   CHOL 184 03/10/2019 1523   TRIG 100 12/07/2019 0434   HDL 44 12/07/2019 0434   HDL 40 03/10/2019 1523   CHOLHDL 4.6 12/07/2019 0434   VLDL 20 12/07/2019 0434   LDLCALC 137 (H) 12/07/2019 0434   LDLCALC 95 03/10/2019 1523   HgbA1c:  Lab Results  Component Value Date   HGBA1C 5.6 12/07/2019   Urine Drug Screen:     Component Value Date/Time   LABOPIA NONE DETECTED 12/06/2019 1208   COCAINSCRNUR NONE DETECTED 12/06/2019 1208   LABBENZ NONE DETECTED 12/06/2019 1208   AMPHETMU NONE DETECTED 12/06/2019 1208   THCU NONE DETECTED 12/06/2019 1208   LABBARB NONE DETECTED 12/06/2019 1208    Alcohol Level     Component Value  Date/Time   ETH <10 05/25/2018 1036    IMAGING CT ANGIO HEAD W OR WO CONTRAST  Result Date: 12/06/2019 CLINICAL DATA:  Stroke, follow-up. Additional history obtained from electronic medical record, left arm and leg pain/weakness. EXAM: CT ANGIOGRAPHY HEAD AND NECK TECHNIQUE: Multidetector CT imaging of the head and neck was performed using the standard protocol during bolus administration of intravenous contrast. Multiplanar CT image reconstructions and MIPs were obtained to evaluate the vascular anatomy. Carotid stenosis measurements (when applicable) are obtained utilizing NASCET criteria, using the distal internal carotid diameter as the denominator. CONTRAST:  81mL OMNIPAQUE IOHEXOL 350 MG/ML SOLN COMPARISON:  Noncontrast head CT performed earlier the same day 12/06/2019, brain MRI 05/25/2018, MRA head 02/19/2017 FINDINGS: CTA NECK FINDINGS Aortic arch: Standard aortic branching. No significant atherosclerotic disease within the visualized aortic arch or proximal major branch vessels of the neck. Right carotid system: CCA and ICA patent within the neck without stenosis. No significant atherosclerotic disease. Left carotid system: CCA and ICA patent within the neck without stenosis. No significant atherosclerotic disease. Vertebral arteries: Left vertebral artery dominant. The vertebral arteries are patent throughout the neck without stenosis.  Skeleton: No acute bony abnormality. Mild cervical spondylosis without high-grade bony spinal canal narrowing. Other neck: No neck mass or cervical lymphadenopathy. Thyroid negative. Upper chest: Ground-glass opacity within the dependent aspect of the visualized lung apices may reflect edema or incomplete atelectasis. Review of the MIP images confirms the above findings CTA HEAD FINDINGS Anterior circulation: The intracranial internal carotid arteries are patent without stenosis. No significant atherosclerotic disease. The bilateral middle and anterior cerebral  arteries are patent without significant proximal stenosis. No intracranial aneurysm is identified. Posterior circulation: Intracranial vertebral arteries are patent without significant stenosis, as is the basilar artery. The bilateral posterior cerebral arteries are patent without significant proximal stenosis. Small right and large left posterior communicating arteries are present. Venous sinuses: Within limitations of contrast timing, no convincing thrombus. Anatomic variants: None significant Review of the MIP images confirms the above findings These results were called by telephone at the time of interpretation on 12/06/2019 at 1:11 pm to provider Dr. Joan Mayans, who verbally acknowledged these results. IMPRESSION: CTA neck: 1. The common carotid, internal carotid and vertebral arteries are patent within the neck without significant stenosis. No significant atherosclerotic disease. 2. Ground-glass opacity within the imaged dependent lung apices, which may reflect edema or atelectasis. CTA head: Unremarkable CTA of the head. No intracranial large vessel occlusion or proximal high-grade arterial stenosis. Electronically Signed   By: Kellie Simmering DO   On: 12/06/2019 13:14   CT ANGIO NECK W OR WO CONTRAST  Result Date: 12/06/2019 CLINICAL DATA:  Stroke, follow-up. Additional history obtained from electronic medical record, left arm and leg pain/weakness. EXAM: CT ANGIOGRAPHY HEAD AND NECK TECHNIQUE: Multidetector CT imaging of the head and neck was performed using the standard protocol during bolus administration of intravenous contrast. Multiplanar CT image reconstructions and MIPs were obtained to evaluate the vascular anatomy. Carotid stenosis measurements (when applicable) are obtained utilizing NASCET criteria, using the distal internal carotid diameter as the denominator. CONTRAST:  28mL OMNIPAQUE IOHEXOL 350 MG/ML SOLN COMPARISON:  Noncontrast head CT performed earlier the same day 12/06/2019, brain MRI  05/25/2018, MRA head 02/19/2017 FINDINGS: CTA NECK FINDINGS Aortic arch: Standard aortic branching. No significant atherosclerotic disease within the visualized aortic arch or proximal major branch vessels of the neck. Right carotid system: CCA and ICA patent within the neck without stenosis. No significant atherosclerotic disease. Left carotid system: CCA and ICA patent within the neck without stenosis. No significant atherosclerotic disease. Vertebral arteries: Left vertebral artery dominant. The vertebral arteries are patent throughout the neck without stenosis. Skeleton: No acute bony abnormality. Mild cervical spondylosis without high-grade bony spinal canal narrowing. Other neck: No neck mass or cervical lymphadenopathy. Thyroid negative. Upper chest: Ground-glass opacity within the dependent aspect of the visualized lung apices may reflect edema or incomplete atelectasis. Review of the MIP images confirms the above findings CTA HEAD FINDINGS Anterior circulation: The intracranial internal carotid arteries are patent without stenosis. No significant atherosclerotic disease. The bilateral middle and anterior cerebral arteries are patent without significant proximal stenosis. No intracranial aneurysm is identified. Posterior circulation: Intracranial vertebral arteries are patent without significant stenosis, as is the basilar artery. The bilateral posterior cerebral arteries are patent without significant proximal stenosis. Small right and large left posterior communicating arteries are present. Venous sinuses: Within limitations of contrast timing, no convincing thrombus. Anatomic variants: None significant Review of the MIP images confirms the above findings These results were called by telephone at the time of interpretation on 12/06/2019 at 1:11 pm to provider Dr. Joan Mayans, who  verbally acknowledged these results. IMPRESSION: CTA neck: 1. The common carotid, internal carotid and vertebral arteries are patent  within the neck without significant stenosis. No significant atherosclerotic disease. 2. Ground-glass opacity within the imaged dependent lung apices, which may reflect edema or atelectasis. CTA head: Unremarkable CTA of the head. No intracranial large vessel occlusion or proximal high-grade arterial stenosis. Electronically Signed   By: Kellie Simmering DO   On: 12/06/2019 13:14   ECHOCARDIOGRAM COMPLETE  Result Date: 12/06/2019   ECHOCARDIOGRAM REPORT   Patient Name:   Daniel Calderon Date of Exam: 12/06/2019 Medical Rec #:  [id number]         Height:       74.0 in Accession #:    [id number]        Weight:       203.9 lb Date of Birth:  10-Dec-1966         BSA:          2.19 m Patient Age:    53 years          BP:           143/111 mmHg Patient Gender: M                 HR:           63 bpm. Exam Location:  Inpatient Procedure: 2D Echo, Cardiac Doppler and Color Doppler Indications:    Stroke  History:        Patient has prior history of Echocardiogram examinations, most                 recent 02/20/2017. TIA, Signs/Symptoms:Dyspnea and Chest Pain;                 Risk Factors:Sleep Apnea and Hypertension.  Sonographer:    Roseanna Rainbow RDCS Referring Phys: Shannon Comments: No subcostal window. Study interrupted and lights turned on in room IMPRESSIONS  1. Left ventricular ejection fraction, by visual estimation, is 50 to 55%. The left ventricle has normal function. There is mildly increased left ventricular hypertrophy.  2. Left ventricular diastolic parameters are consistent with Grade I diastolic dysfunction (impaired relaxation).  3. The left ventricle has no regional wall motion abnormalities.  4. Global right ventricle has normal systolic function.The right ventricular size is normal. No increase in right ventricular wall thickness.  5. Left atrial size was normal.  6. Right atrial size was normal.  7. The mitral valve is normal in structure. No evidence of mitral valve regurgitation. No  evidence of mitral stenosis.  8. The tricuspid valve is normal in structure. Tricuspid valve regurgitation is not demonstrated.  9. The aortic valve is normal in structure. Aortic valve regurgitation is not visualized. No evidence of aortic valve sclerosis or stenosis. 10. The pulmonic valve was normal in structure. Pulmonic valve regurgitation is not visualized. 11. TR signal is inadequate for assessing pulmonary artery systolic pressure. 12. The inferior vena cava is normal in size with greater than 50% respiratory variability, suggesting right atrial pressure of 3 mmHg. FINDINGS  Left Ventricle: Left ventricular ejection fraction, by visual estimation, is 50 to 55%. The left ventricle has normal function. The left ventricle has no regional wall motion abnormalities. The left ventricular internal cavity size was the left ventricle is normal in size. There is mildly increased left ventricular hypertrophy. Concentric left ventricular hypertrophy. Left ventricular diastolic parameters are consistent with Grade I diastolic dysfunction (impaired relaxation). Normal left  atrial pressure. Right Ventricle: The right ventricular size is normal. No increase in right ventricular wall thickness. Global RV systolic function is has normal systolic function. Left Atrium: Left atrial size was normal in size. Right Atrium: Right atrial size was normal in size Pericardium: There is no evidence of pericardial effusion. Mitral Valve: The mitral valve is normal in structure. No evidence of mitral valve regurgitation. No evidence of mitral valve stenosis by observation. Tricuspid Valve: The tricuspid valve is normal in structure. Tricuspid valve regurgitation is not demonstrated. Aortic Valve: The aortic valve is normal in structure. Aortic valve regurgitation is not visualized. The aortic valve is structurally normal, with no evidence of sclerosis or stenosis. Pulmonic Valve: The pulmonic valve was normal in structure. Pulmonic valve  regurgitation is not visualized. Pulmonic regurgitation is not visualized. Aorta: The aortic root, ascending aorta and aortic arch are all structurally normal, with no evidence of dilitation or obstruction. Venous: The inferior vena cava is normal in size with greater than 50% respiratory variability, suggesting right atrial pressure of 3 mmHg. IAS/Shunts: No atrial level shunt detected by color flow Doppler. There is no evidence of a patent foramen ovale. No ventricular septal defect is seen or detected. There is no evidence of an atrial septal defect.  LEFT VENTRICLE PLAX 2D LVIDd:         4.90 cm       Diastology LVIDs:         3.20 cm       LV e' lateral:   7.94 cm/s LV PW:         1.50 cm       LV E/e' lateral: 4.9 LV IVS:        1.20 cm       LV e' medial:    4.46 cm/s LVOT diam:     2.40 cm       LV E/e' medial:  8.8 LV SV:         72 ml LV SV Index:   32.55 LVOT Area:     4.52 cm  LV Volumes (MOD) LV area d, A2C:    31.00 cm LV area d, A4C:    33.40 cm LV area s, A2C:    16.60 cm LV area s, A4C:    20.60 cm LV major d, A2C:   7.97 cm LV major d, A4C:   7.81 cm LV major s, A2C:   5.97 cm LV major s, A4C:   6.67 cm LV vol d, MOD A2C: 98.5 ml LV vol d, MOD A4C: 118.0 ml LV vol s, MOD A2C: 39.6 ml LV vol s, MOD A4C: 53.5 ml LV SV MOD A2C:     58.9 ml LV SV MOD A4C:     118.0 ml LV SV MOD BP:      60.4 ml RIGHT VENTRICLE            IVC RV S prime:     7.40 cm/s  IVC diam: 1.80 cm TAPSE (M-mode): 2.0 cm LEFT ATRIUM             Index       RIGHT ATRIUM           Index LA diam:        3.70 cm 1.69 cm/m  RA Area:     13.20 cm LA Vol (A2C):   48.5 ml 22.13 ml/m RA Volume:   28.30 ml  12.91 ml/m LA Vol (A4C):   26.1 ml  11.91 ml/m LA Biplane Vol: 34.4 ml 15.70 ml/m  AORTIC VALVE LVOT Vmax:   84.90 cm/s LVOT Vmean:  63.700 cm/s LVOT VTI:    0.169 m  AORTA Ao Root diam: 3.00 cm Ao Asc diam:  3.40 cm MITRAL VALVE MV Area (PHT): 2.26 cm             SHUNTS MV PHT:        97.15 msec           Systemic VTI:  0.17 m  MV Decel Time: 335 msec             Systemic Diam: 2.40 cm MV E velocity: 39.10 cm/s 103 cm/s MV A velocity: 51.10 cm/s 70.3 cm/s MV E/A ratio:  0.77       1.5  Mihai Croitoru MD Electronically signed by Sanda Klein MD Signature Date/Time: 12/06/2019/4:59:42 PM    Final    CT HEAD CODE STROKE WO CONTRAST  Result Date: 12/06/2019 CLINICAL DATA:  Code stroke. EXAM: CT HEAD WITHOUT CONTRAST TECHNIQUE: Contiguous axial images were obtained from the base of the skull through the vertex without intravenous contrast. COMPARISON:  None. FINDINGS: Brain: There is no acute intracranial hemorrhage, mass effect, or edema. Gray-white differentiation remains preserved. Ventricles and sulci are normal in size and configuration. There is no extra-axial fluid collection Vascular: No definite hyperdense vessel. Skull: Unremarkable. Sinuses/Orbits: Primarily ethmoid sinus mucosal thickening. Orbits are unremarkable. Other: Included mastoid air cells are clear. ASPECTS [facility] Stroke Program Early CT Score) - Ganglionic level infarction (caudate, lentiform nuclei, internal capsule, insula, M1-M3 cortex): 7 - Supraganglionic infarction (M4-M6 cortex): 3 Total score (0-10 with 10 being normal): 10 IMPRESSION: 1. No acute intracranial hemorrhage, mass effect, or evidence of acute infarction. 2. ASPECTS is 10 These results were called by telephone at the time of interpretation on 12/06/2019 at 12:03 pm to provider [facility] , who verbally acknowledged these results. Electronically Signed   By: Macy Mis M.D.   On: 12/06/2019 12:06    PHYSICAL EXAM  Temp:  [98.3 F (36.8 C)-98.9 F (37.2 C)] 98.3 F (36.8 C) (12/16 0800) Pulse Rate:  [63-93] 93 (12/16 1000) Resp:  [13-23] 15 (12/16 1000) BP: (121-154)/(81-109) 126/81 (12/16 1000) SpO2:  [95 %-100 %] 95 % (12/16 1000) Weight:  [92.5 kg-95.3 kg] 92.5 kg (12/15 1548)  General - Well nourished, well developed, in no apparent distress, complains of left back of  head pain.  Ophthalmologic - fundi not visualized due to noncooperation.  Cardiovascular - Regular rhythm and rate.  Mental Status -  Level of arousal and orientation to time, place, and person were intact. Language including expression, naming, repetition, comprehension was assessed and found intact. Fund of Knowledge was assessed and was intact.  Cranial Nerves II - XII - II - Visual field intact OU. III, IV, VI - Extraocular movements intact. V - Facial sensation intact bilaterally. VII - Facial movement intact bilaterally. VIII - Hearing & vestibular intact bilaterally. X - Palate elevates symmetrically. XI - Chin turning & shoulder shrug intact bilaterally. XII - Tongue protrusion intact.  Motor Strength - The patient's strength was normal in all extremities and pronator drift was absent.  Bulk was normal and fasciculations were absent.   Motor Tone - Muscle tone was assessed at the neck and appendages and was normal.  Reflexes - The patient's reflexes were symmetrical in all extremities and he had no pathological reflexes.  Sensory - Light touch, temperature/pinprick were assessed and were symmetrical.  Coordination - The patient had normal movements in the hands and feet with no ataxia or dysmetria.  Tremor was absent.  Gait and Station - deferred.   ASSESSMENT/PLAN Mr. DASIR WERITO is a 53 y.o. male with history of thoracic outlet syndrome, sleep apnea, migraine headaches, hypertension, fatigue, erectile dysfunction, depression, anxiety presenting to [facility] with  sudden onset of chest pain, left arm pain and left leg pain followed by decreased sensation L side w/ intermittent L hand flexion. He was administered IV tPA 12/06/2019 at 1228 and transferred to [facility].  Cervical and lumbar radiculopathy, less likely TIA  Code Stroke CT head No acute abnormality. ASPECTS 10.     CTA head Unremarkable   CTA neck no  atherosclerosis. Ground glass opacity lung apices  MRI  Brain no acute infarct  MRI C-spine C7 nerve root impingement   2D Echo EF 50-55%. No source of embolus   LDL 137  HgbA1c 5.6  SCDs for VTE prophylaxis  No antithrombotic prior to admission, now on No antithrombotic. Calculated 10 year ASCVD risk 2.4% (<5%), low risk category, no ASA needed at this time.  Therapy recommendations:  pending   Disposition:  pending   Hypertension  Home meds:  norvasc 10  Stable BP goal < 180/105 x 24h following tPA administration . Long-term BP goal normotensive  Hyperlipidemia  Home meds:  No statin  Now on lipitor 40  LDL 137, goal < 70  Continue statin at discharge  Other Stroke Risk Factors  ETOH use, alcohol level <10, advised to drink no more than 2 drink(s) a day  Hx stroke-like events x 2 in the past. No tPA. MRIs negative for infarct  Family hx stroke (paternal uncle)  Migraines  Obstructive sleep apnea, on CPAP at home  Other Active Problems  Depression on zoloft  Testosterone deficiency on testosterone   Hospital day # 1  This patient is critically ill due to suspected stroke s/p tPA and at significant risk of neurological worsening, death form hemorrhagic conversion, hemorrhages, seizure, cervical myelopathy. This patient's care requires constant monitoring of vital signs, hemodynamics, respiratory and cardiac monitoring, review of multiple databases, neurological assessment, discussion with family, other specialists and medical decision making of high complexity. I spent 35 minutes of neurocritical care time in the care of this patient.  Rosalin Hawking, MD PhD Stroke Neurology 12/07/2019 10:54 AM  To contact Stroke Continuity provider, please refer to [email or web address] After hours, contact General Neurology

## 2019-12-07 NOTE — Discharge Summary (Signed)
Stroke Discharge Summary  Patient ID: Daniel Calderon   MRN: [id number]      DOB: February 15, 1966  Date of Admission: 12/06/2019 Date of Discharge: 12/07/2019  Attending Physician:  Rosalin Hawking, MD, Stroke MD Consultant(s):    None  Patient's PCP:  [name], MD  DISCHARGE DIAGNOSIS:  Principal Problem:   Radiculopathy, cervical and lumbar Active Problems:   Hypertension   Migraines   Adjustment disorder with mixed anxiety and depressed mood   Hypogonadism in male   OSA on CPAP   Stroke-like symptoms s/p tPA   Hyperlipidemia  Allergies as of 12/07/2019      Reactions   Erythromycin Nausea And Vomiting   Cephalexin Other (See Comments)   Unknown   Dexamethasone Other (See Comments)   Unknown   Oxycodone-acetaminophen Other (See Comments)   Unknown   Prednisone Other (See Comments)   Unknown   Gabapentin Other (See Comments)   Aggressive   Ibuprofen Itching, Other (See Comments)   Can take in low doses per pt      Medication List    STOP taking these medications   tamsulosin 0.4 MG Caps capsule Commonly known as: FLOMAX     TAKE these medications   amLODipine 10 MG tablet Commonly known as: NORVASC Take 0.5 tablets (5 mg total) by mouth daily.   atorvastatin 40 MG tablet Commonly known as: LIPITOR Take 1 tablet (40 mg total) by mouth daily at 6 PM.   cyanocobalamin 1000 MCG/ML injection Commonly known as: (VITAMIN B-12) Inject 1,000 mcg into the muscle every 30 (thirty) days.   multivitamin tablet Take 1 tablet by mouth daily.   omeprazole 20 MG capsule Commonly known as: PRILOSEC Take 20 mg by mouth daily. What changed: Another medication with the same name was removed. Continue taking this medication, and follow the directions you see here.   sertraline 100 MG tablet Commonly known as: ZOLOFT TAKE 1 TABLET BY MOUTH ONCE DAILY.   testosterone 50 MG/5GM (1%) Gel Commonly known as: ANDROGEL Place 5 g onto the skin daily.        LABORATORY STUDIES CBC    Component Value Date/Time   WBC 8.8 12/06/2019 1145   RBC 4.97 12/06/2019 1145   HGB 13.6 12/06/2019 1145   HGB 14.8 03/01/2018 0957   HCT 41.0 12/06/2019 1145   HCT 40.1 09/14/2019 1338   PLT 192 12/06/2019 1145   MCV 82.5 12/06/2019 1145   MCH 27.4 12/06/2019 1145   MCHC 33.2 12/06/2019 1145   RDW 13.9 12/06/2019 1145   LYMPHSABS 1.2 12/06/2019 1145   MONOABS 0.5 12/06/2019 1145   EOSABS 0.2 12/06/2019 1145   BASOSABS 0.1 12/06/2019 1145   CMP    Component Value Date/Time   NA 137 12/06/2019 1145   NA 141 03/10/2019 1523   K 3.8 12/06/2019 1145   CL 104 12/06/2019 1145   CO2 24 12/06/2019 1145   GLUCOSE 109 (H) 12/06/2019 1145   BUN 22 (H) 12/06/2019 1145   BUN 17 03/10/2019 1523   CREATININE 1.08 12/06/2019 1145   CALCIUM 9.5 12/06/2019 1145   PROT 7.3 12/06/2019 1145   PROT 6.5 03/10/2019 1523   ALBUMIN 4.8 12/06/2019 1145   ALBUMIN 4.8 03/10/2019 1523   AST 42 (H) 12/06/2019 1145   ALT 68 (H) 12/06/2019 1145   ALKPHOS 96 12/06/2019 1145   BILITOT 0.8 12/06/2019 1145   BILITOT 0.4 03/10/2019 1523   GFRNONAA >60 12/06/2019 1145   GFRAA >  60 12/06/2019 1145   COAGS Lab Results  Component Value Date   INR 1.0 12/06/2019   INR 0.97 05/25/2018   INR 1.02 02/19/2017   Lipid Panel    Component Value Date/Time   CHOL 201 (H) 12/07/2019 0434   CHOL 184 03/10/2019 1523   TRIG 100 12/07/2019 0434   HDL 44 12/07/2019 0434   HDL 40 03/10/2019 1523   CHOLHDL 4.6 12/07/2019 0434   VLDL 20 12/07/2019 0434   LDLCALC 137 (H) 12/07/2019 0434   LDLCALC 95 03/10/2019 1523   HgbA1C  Lab Results  Component Value Date   HGBA1C 5.6 12/07/2019   Urinalysis    Component Value Date/Time   COLORURINE YELLOW (A) 02/19/2017 1020   APPEARANCEUR CLEAR (A) 02/19/2017 1020   LABSPEC 1.018 02/19/2017 1020   PHURINE 6.0 02/19/2017 1020   GLUCOSEU NEGATIVE 02/19/2017 1020   HGBUR NEGATIVE 02/19/2017 1020   BILIRUBINUR negative 08/15/2019  1527   KETONESUR NEGATIVE 02/19/2017 1020   PROTEINUR Negative 08/15/2019 1527   PROTEINUR NEGATIVE 02/19/2017 1020   UROBILINOGEN 0.2 08/15/2019 1527   NITRITE negative 08/15/2019 1527   NITRITE NEGATIVE 02/19/2017 1020   LEUKOCYTESUR Negative 08/15/2019 1527   Urine Drug Screen     Component Value Date/Time   LABOPIA NONE DETECTED 12/06/2019 1208   COCAINSCRNUR NONE DETECTED 12/06/2019 1208   LABBENZ NONE DETECTED 12/06/2019 1208   AMPHETMU NONE DETECTED 12/06/2019 1208   THCU NONE DETECTED 12/06/2019 1208   LABBARB NONE DETECTED 12/06/2019 1208     SIGNIFICANT DIAGNOSTIC STUDIES CT ANGIO HEAD W OR WO CONTRAST  Result Date: 12/06/2019 CLINICAL DATA:  Stroke, follow-up. Additional history obtained from electronic medical record, left arm and leg pain/weakness. EXAM: CT ANGIOGRAPHY HEAD AND NECK TECHNIQUE: Multidetector CT imaging of the head and neck was performed using the standard protocol during bolus administration of intravenous contrast. Multiplanar CT image reconstructions and MIPs were obtained to evaluate the vascular anatomy. Carotid stenosis measurements (when applicable) are obtained utilizing NASCET criteria, using the distal internal carotid diameter as the denominator. CONTRAST:  33mL OMNIPAQUE IOHEXOL 350 MG/ML SOLN COMPARISON:  Noncontrast head CT performed earlier the same day 12/06/2019, brain MRI 05/25/2018, MRA head 02/19/2017 FINDINGS: CTA NECK FINDINGS Aortic arch: Standard aortic branching. No significant atherosclerotic disease within the visualized aortic arch or proximal major branch vessels of the neck. Right carotid system: CCA and ICA patent within the neck without stenosis. No significant atherosclerotic disease. Left carotid system: CCA and ICA patent within the neck without stenosis. No significant atherosclerotic disease. Vertebral arteries: Left vertebral artery dominant. The vertebral arteries are patent throughout the neck without stenosis. Skeleton: No  acute bony abnormality. Mild cervical spondylosis without high-grade bony spinal canal narrowing. Other neck: No neck mass or cervical lymphadenopathy. Thyroid negative. Upper chest: Ground-glass opacity within the dependent aspect of the visualized lung apices may reflect edema or incomplete atelectasis. Review of the MIP images confirms the above findings CTA HEAD FINDINGS Anterior circulation: The intracranial internal carotid arteries are patent without stenosis. No significant atherosclerotic disease. The bilateral middle and anterior cerebral arteries are patent without significant proximal stenosis. No intracranial aneurysm is identified. Posterior circulation: Intracranial vertebral arteries are patent without significant stenosis, as is the basilar artery. The bilateral posterior cerebral arteries are patent without significant proximal stenosis. Small right and large left posterior communicating arteries are present. Venous sinuses: Within limitations of contrast timing, no convincing thrombus. Anatomic variants: None significant Review of the MIP images confirms the above findings These  results were called by telephone at the time of interpretation on 12/06/2019 at 1:11 pm to provider Dr. Joan Mayans, who verbally acknowledged these results. IMPRESSION: CTA neck: 1. The common carotid, internal carotid and vertebral arteries are patent within the neck without significant stenosis. No significant atherosclerotic disease. 2. Ground-glass opacity within the imaged dependent lung apices, which may reflect edema or atelectasis. CTA head: Unremarkable CTA of the head. No intracranial large vessel occlusion or proximal high-grade arterial stenosis. Electronically Signed   By: Kellie Simmering DO   On: 12/06/2019 13:14   CT ANGIO NECK W OR WO CONTRAST  Result Date: 12/06/2019 CLINICAL DATA:  Stroke, follow-up. Additional history obtained from electronic medical record, left arm and leg pain/weakness. EXAM: CT  ANGIOGRAPHY HEAD AND NECK TECHNIQUE: Multidetector CT imaging of the head and neck was performed using the standard protocol during bolus administration of intravenous contrast. Multiplanar CT image reconstructions and MIPs were obtained to evaluate the vascular anatomy. Carotid stenosis measurements (when applicable) are obtained utilizing NASCET criteria, using the distal internal carotid diameter as the denominator. CONTRAST:  66mL OMNIPAQUE IOHEXOL 350 MG/ML SOLN COMPARISON:  Noncontrast head CT performed earlier the same day 12/06/2019, brain MRI 05/25/2018, MRA head 02/19/2017 FINDINGS: CTA NECK FINDINGS Aortic arch: Standard aortic branching. No significant atherosclerotic disease within the visualized aortic arch or proximal major branch vessels of the neck. Right carotid system: CCA and ICA patent within the neck without stenosis. No significant atherosclerotic disease. Left carotid system: CCA and ICA patent within the neck without stenosis. No significant atherosclerotic disease. Vertebral arteries: Left vertebral artery dominant. The vertebral arteries are patent throughout the neck without stenosis. Skeleton: No acute bony abnormality. Mild cervical spondylosis without high-grade bony spinal canal narrowing. Other neck: No neck mass or cervical lymphadenopathy. Thyroid negative. Upper chest: Ground-glass opacity within the dependent aspect of the visualized lung apices may reflect edema or incomplete atelectasis. Review of the MIP images confirms the above findings CTA HEAD FINDINGS Anterior circulation: The intracranial internal carotid arteries are patent without stenosis. No significant atherosclerotic disease. The bilateral middle and anterior cerebral arteries are patent without significant proximal stenosis. No intracranial aneurysm is identified. Posterior circulation: Intracranial vertebral arteries are patent without significant stenosis, as is the basilar artery. The bilateral posterior  cerebral arteries are patent without significant proximal stenosis. Small right and large left posterior communicating arteries are present. Venous sinuses: Within limitations of contrast timing, no convincing thrombus. Anatomic variants: None significant Review of the MIP images confirms the above findings These results were called by telephone at the time of interpretation on 12/06/2019 at 1:11 pm to provider Dr. Joan Mayans, who verbally acknowledged these results. IMPRESSION: CTA neck: 1. The common carotid, internal carotid and vertebral arteries are patent within the neck without significant stenosis. No significant atherosclerotic disease. 2. Ground-glass opacity within the imaged dependent lung apices, which may reflect edema or atelectasis. CTA head: Unremarkable CTA of the head. No intracranial large vessel occlusion or proximal high-grade arterial stenosis. Electronically Signed   By: Kellie Simmering DO   On: 12/06/2019 13:14   MR BRAIN WO CONTRAST  Result Date: 12/07/2019 CLINICAL DATA:  Possible stroke, follow-up EXAM: MRI HEAD WITHOUT CONTRAST TECHNIQUE: Multiplanar, multiecho pulse sequences of the brain and surrounding structures were obtained without intravenous contrast. COMPARISON:  05/25/2018 FINDINGS: Brain: There is no acute infarction or intracranial hemorrhage. There is no intracranial mass, mass effect, or edema. There is no hydrocephalus or extra-axial fluid collection. Vascular: Major vessel flow voids at the  skull base are preserved. Skull and upper cervical spine: Normal marrow signal is preserved. Sinuses/Orbits: Moderate ethmoid mucosal thickening. Additional mild paranasal sinus mucosal thickening. Orbits are unremarkable. Other: Sella is unremarkable.  Mastoid air cells are clear. IMPRESSION: No evidence of recent infarction, intracranial hemorrhage, or mass. Electronically Signed   By: Macy Mis M.D.   On: 12/07/2019 12:38   MR CERVICAL SPINE WO CONTRAST  Result Date:  12/07/2019 CLINICAL DATA:  Neck pain, acute EXAM: MRI CERVICAL SPINE WITHOUT CONTRAST TECHNIQUE: Multiplanar, multisequence MR imaging of the cervical spine was performed. No intravenous contrast was administered. COMPARISON:  None. FINDINGS: Alignment: There is straightening of the normal cervical lordosis. Vertebrae: The vertebral body heights are well maintained. No fracture, marrow edema,or pathologic marrow infiltration. Cord: Normal signal and morphology. Posterior Fossa, vertebral arteries, paraspinal tissues: The visualized portion of the posterior fossa is unremarkable. Normal flow voids seen within the vertebral arteries. The paraspinal soft tissues are unremarkable. Disc levels: C1-C2: Atlanto-axial junction is normal, without canal narrowing C2-C3: There is a minimal disc osteophyte complex causes mild left-sided neural foraminal narrowing. C3-C4: There is a disc osteophyte complex and uncovertebral osteophytes which causes moderate left and mild right neural foraminal narrowing. There is mild effacement anterior thecal sac. C4-C5: There is a disc osteophyte complex and uncovertebral osteophytes which causes mild to moderate left neural foraminal narrowing. C5-C6: There is a disc osteophyte complex and uncovertebral osteophytes which causes severe left and moderate to severe right neural foraminal narrowing. There is mild central canal stenosis. C6-C7: There is a broad-based disc bulge with a right lateral recess disc protrusion with an annular fissure which contacts and impinges the right C7 nerve root. There is also uncovertebral osteophytes. Moderate to severe bilateral neural foraminal narrowing is seen and mild central canal stenosis. C7-T1: No significant spinal canal or neural foraminal narrowing IMPRESSION: Cervical spine straightening. Cervical spine spondylosis most notable at C6-7 with a right lateral recess disc protrusion with an annular fissure which contacts and impinges the right C7  nerve root. There is also moderate to severe bilateral neural foraminal narrowing and mild central canal stenosis. Electronically Signed   By: Prudencio Pair M.D.   On: 12/07/2019 12:46   ECHOCARDIOGRAM COMPLETE  Result Date: 12/06/2019   ECHOCARDIOGRAM REPORT   Patient Name:   Daniel Calderon Date of Exam: 12/06/2019 Medical Rec #:  [id number]         Height:       74.0 in Accession #:    [id number]        Weight:       203.9 lb Date of Birth:  1966/01/28         BSA:          2.19 m Patient Age:    76 years          BP:           143/111 mmHg Patient Gender: M                 HR:           63 bpm. Exam Location:  Inpatient Procedure: 2D Echo, Cardiac Doppler and Color Doppler Indications:    Stroke  History:        Patient has prior history of Echocardiogram examinations, most                 recent 02/20/2017. TIA, Signs/Symptoms:Dyspnea and Chest Pain;  Risk Factors:Sleep Apnea and Hypertension.  Sonographer:    Roseanna Rainbow RDCS Referring Phys: [id number] Comments: No subcostal window. Study interrupted and lights turned on in room IMPRESSIONS  1. Left ventricular ejection fraction, by visual estimation, is 50 to 55%. The left ventricle has normal function. There is mildly increased left ventricular hypertrophy.  2. Left ventricular diastolic parameters are consistent with Grade I diastolic dysfunction (impaired relaxation).  3. The left ventricle has no regional wall motion abnormalities.  4. Global right ventricle has normal systolic function.The right ventricular size is normal. No increase in right ventricular wall thickness.  5. Left atrial size was normal.  6. Right atrial size was normal.  7. The mitral valve is normal in structure. No evidence of mitral valve regurgitation. No evidence of mitral stenosis.  8. The tricuspid valve is normal in structure. Tricuspid valve regurgitation is not demonstrated.  9. The aortic valve is normal in structure. Aortic valve  regurgitation is not visualized. No evidence of aortic valve sclerosis or stenosis. 10. The pulmonic valve was normal in structure. Pulmonic valve regurgitation is not visualized. 11. TR signal is inadequate for assessing pulmonary artery systolic pressure. 12. The inferior vena cava is normal in size with greater than 50% respiratory variability, suggesting right atrial pressure of 3 mmHg. FINDINGS  Left Ventricle: Left ventricular ejection fraction, by visual estimation, is 50 to 55%. The left ventricle has normal function. The left ventricle has no regional wall motion abnormalities. The left ventricular internal cavity size was the left ventricle is normal in size. There is mildly increased left ventricular hypertrophy. Concentric left ventricular hypertrophy. Left ventricular diastolic parameters are consistent with Grade I diastolic dysfunction (impaired relaxation). Normal left atrial pressure. Right Ventricle: The right ventricular size is normal. No increase in right ventricular wall thickness. Global RV systolic function is has normal systolic function. Left Atrium: Left atrial size was normal in size. Right Atrium: Right atrial size was normal in size Pericardium: There is no evidence of pericardial effusion. Mitral Valve: The mitral valve is normal in structure. No evidence of mitral valve regurgitation. No evidence of mitral valve stenosis by observation. Tricuspid Valve: The tricuspid valve is normal in structure. Tricuspid valve regurgitation is not demonstrated. Aortic Valve: The aortic valve is normal in structure. Aortic valve regurgitation is not visualized. The aortic valve is structurally normal, with no evidence of sclerosis or stenosis. Pulmonic Valve: The pulmonic valve was normal in structure. Pulmonic valve regurgitation is not visualized. Pulmonic regurgitation is not visualized. Aorta: The aortic root, ascending aorta and aortic arch are all structurally normal, with no evidence of  dilitation or obstruction. Venous: The inferior vena cava is normal in size with greater than 50% respiratory variability, suggesting right atrial pressure of 3 mmHg. IAS/Shunts: No atrial level shunt detected by color flow Doppler. There is no evidence of a patent foramen ovale. No ventricular septal defect is seen or detected. There is no evidence of an atrial septal defect.  LEFT VENTRICLE PLAX 2D LVIDd:         4.90 cm       Diastology LVIDs:         3.20 cm       LV e' lateral:   7.94 cm/s LV PW:         1.50 cm       LV E/e' lateral: 4.9 LV IVS:        1.20 cm  LV e' medial:    4.46 cm/s LVOT diam:     2.40 cm       LV E/e' medial:  8.8 LV SV:         72 ml LV SV Index:   32.55 LVOT Area:     4.52 cm  LV Volumes (MOD) LV area d, A2C:    31.00 cm LV area d, A4C:    33.40 cm LV area s, A2C:    16.60 cm LV area s, A4C:    20.60 cm LV major d, A2C:   7.97 cm LV major d, A4C:   7.81 cm LV major s, A2C:   5.97 cm LV major s, A4C:   6.67 cm LV vol d, MOD A2C: 98.5 ml LV vol d, MOD A4C: 118.0 ml LV vol s, MOD A2C: 39.6 ml LV vol s, MOD A4C: 53.5 ml LV SV MOD A2C:     58.9 ml LV SV MOD A4C:     118.0 ml LV SV MOD BP:      60.4 ml RIGHT VENTRICLE            IVC RV S prime:     7.40 cm/s  IVC diam: 1.80 cm TAPSE (M-mode): 2.0 cm LEFT ATRIUM             Index       RIGHT ATRIUM           Index LA diam:        3.70 cm 1.69 cm/m  RA Area:     13.20 cm LA Vol (A2C):   48.5 ml 22.13 ml/m RA Volume:   28.30 ml  12.91 ml/m LA Vol (A4C):   26.1 ml 11.91 ml/m LA Biplane Vol: 34.4 ml 15.70 ml/m  AORTIC VALVE LVOT Vmax:   84.90 cm/s LVOT Vmean:  63.700 cm/s LVOT VTI:    0.169 m  AORTA Ao Root diam: 3.00 cm Ao Asc diam:  3.40 cm MITRAL VALVE MV Area (PHT): 2.26 cm             SHUNTS MV PHT:        97.15 msec           Systemic VTI:  0.17 m MV Decel Time: 335 msec             Systemic Diam: 2.40 cm MV E velocity: 39.10 cm/s 103 cm/s MV A velocity: 51.10 cm/s 70.3 cm/s MV E/A ratio:  0.77       1.5  Mihai Croitoru MD  Electronically signed by Sanda Klein MD Signature Date/Time: 12/06/2019/4:59:42 PM    Final    CT HEAD CODE STROKE WO CONTRAST  Result Date: 12/06/2019 CLINICAL DATA:  Code stroke. EXAM: CT HEAD WITHOUT CONTRAST TECHNIQUE: Contiguous axial images were obtained from the base of the skull through the vertex without intravenous contrast. COMPARISON:  None. FINDINGS: Brain: There is no acute intracranial hemorrhage, mass effect, or edema. Gray-white differentiation remains preserved. Ventricles and sulci are normal in size and configuration. There is no extra-axial fluid collection Vascular: No definite hyperdense vessel. Skull: Unremarkable. Sinuses/Orbits: Primarily ethmoid sinus mucosal thickening. Orbits are unremarkable. Other: Included mastoid air cells are clear. ASPECTS [facility] Stroke Program Early CT Score) - Ganglionic level infarction (caudate, lentiform nuclei, internal capsule, insula, M1-M3 cortex): 7 - Supraganglionic infarction (M4-M6 cortex): 3 Total score (0-10 with 10 being normal): 10 IMPRESSION: 1. No acute intracranial hemorrhage, mass effect, or evidence of acute infarction. 2. ASPECTS is  10 These results were called by telephone at the time of interpretation on 12/06/2019 at 12:03 pm to provider [facility] , who verbally acknowledged these results. Electronically Signed   By: Macy Mis M.D.   On: 12/06/2019 12:06       HISTORY OF PRESENT ILLNESS Daniel Calderon is a 53 y.o. male with history of thoracic outlet syndrome, sleep apnea, migraine headaches, hypertension, fatigue, erectile dysfunction, depression, anxiety.  Patient presented to [facility] secondary to sudden onset of chest pain, left arm pain and left leg pain.  This was followed by a decreased sensation on the left side along with intermittent periods of left hand flexion.  Patient also states that his left arm and leg felt weak.  LKW 12/06/2019 at 1115. mRS 0. NIHSS 1. Patient was  administered TPA at [facility].  Patient was then transferred to [facility].  On consultation at [facility],  patient states that he felt much stronger on the left side.  He also states that the left-sided decreased sensation has improved, however, he still is getting intermittent shocklike sensations that are going down his left arm and his left leg. NIH stroke scale is 0.  Chart review 1.  Patient seen 02/19/2017 for left-sided numbness and L eye blurring of vision.  Symptoms resolved by the time he got to the emergency department.  Unclear if was a TIA or migraine, irregardless patient was not a TPA candidate at that time.  MRI negative for acute changes, carotid Doppler showed no evidence of hemodynamically significant stenosis, LDL 108.  Patient had follow-up with Dr. Tomi Likens 05/01/2017.  At that time he states there is no explanation why his speech is slightly more disfluent when he addresses somebody to his left and suspected possible nonorganic etiology. 2.  Patient was consulted in the hospital on 05/25/2018.  At that time chief complaint was left-sided numbness and difficulty with vision.  tPA was not given.  MRI brain was negative. Recommendations were to continue aspirin 81 mg daily.   HOSPITAL COURSE Daniel Calderon is a 53 y.o. male with history of thoracic outlet syndrome, sleep apnea, migraine headaches, hypertension, fatigue, erectile dysfunction, depression, anxiety presenting to [facility] with sudden onset of chest pain, left arm pain and left leg pain followed by decreased sensation L side w/ intermittent L hand flexion. He was administered IV tPA 12/06/2019 at 1228 and transferred to [facility].  Cervical and lumbar radiculopathy, less likely TIA  Code Stroke CT head No acute abnormality. ASPECTS 10.     CTA head Unremarkable   CTA neck no atherosclerosis. Ground glass opacity lung apices  MRI  Brain no  acute infarct  MRI C-spine C7 nerve root impingement   2D Echo EF 50-55%. No source of embolus   LDL 137  HgbA1c 5.6  No antithrombotic prior to admission, now on No antithrombotic. Calculated 10 year ASCVD risk 2.4% (<5%), low risk category, no ASA needed at this time.  Therapy recommendations:  no therapy needs  Disposition:  return home  Follow up Dr. Tomi Likens   Hypertension  Home meds:  norvasc 10  Stable  BP goal normotensive  Hyperlipidemia  Home meds:  No statin  Now on lipitor 40  LDL 137  Continue statin at discharge  Other Stroke Risk Factors  ETOH use, alcohol level <10, advised to drink no more than 2 drink(s) a day  Hx stroke-like events x 2  in the past. No tPA. MRIs negative for infarct  Family hx stroke (paternal uncle)  Migraines  Obstructive sleep apnea, on CPAP at home  Other Active Problems  Depression on zoloft  Testosterone deficiency on testosterone   DISCHARGE EXAM Blood pressure 127/86, pulse 79, temperature 98.3 F (36.8 C), temperature source Oral, resp. rate (!) 22, height 6\' 2"  (1.88 m), weight 92.5 kg, SpO2 96 %. General - Well nourished, well developed, in no apparent distress, complains of left back of head pain.  Ophthalmologic - fundi not visualized due to noncooperation.  Cardiovascular - Regular rhythm and rate.  Mental Status -  Level of arousal and orientation to time, place, and person were intact. Language including expression, naming, repetition, comprehension was assessed and found intact. Fund of Knowledge was assessed and was intact.  Cranial Nerves II - XII - II - Visual field intact OU. III, IV, VI - Extraocular movements intact. V - Facial sensation intact bilaterally. VII - Facial movement intact bilaterally. VIII - Hearing & vestibular intact bilaterally. X - Palate elevates symmetrically. XI - Chin turning & shoulder shrug intact bilaterally. XII - Tongue protrusion intact.  Motor  Strength - The patient's strength was normal in all extremities and pronator drift was absent.  Bulk was normal and fasciculations were absent.   Motor Tone - Muscle tone was assessed at the neck and appendages and was normal.  Reflexes - The patient's reflexes were symmetrical in all extremities and he had no pathological reflexes.  Sensory - Light touch, temperature/pinprick were assessed and were symmetrical.    Coordination - The patient had normal movements in the hands and feet with no ataxia or dysmetria.  Tremor was absent.  Gait and Station - deferred.  Discharge Diet   Heart healthy thin liquids  DISCHARGE PLAN  Disposition:  Return home  Ongoing stroke risk factor control by Primary Care Physician at time of discharge  Follow-up [name], MD in 2 weeks.  Follow-up with Dr. Tomi Likens  at [facility], call to schedule an appointment in 4 weeks.   25 minutes were spent preparing discharge.  Burnetta Sabin, MSN, APRN, ANVP-BC, AGPCNP-BC Advanced Practice Stroke Nurse [place] for Schedule & Pager information 12/07/2019 3:05 PM

## 2019-12-07 NOTE — Evaluation (Signed)
Speech Language Pathology Evaluation Patient Details Name: Daniel Calderon MRN: [id number] DOB: 1966/04/28 Today's Date: 12/07/2019 Time: [id number] SLP Time Calculation (min) (ACUTE ONLY): 19 min  Problem List:  Patient Active Problem List   Diagnosis Date Noted  . Stroke ([place]) 12/06/2019  . Abdominal pain, epigastric   . Left flank pain 08/15/2019  . Elevated LFTs 08/10/2019  . Hypogammaglobulinemia ([place]) 05/17/2019  . Excessive daytime sleepiness 03/28/2019  . Cataplexy 03/28/2019  . OSA on CPAP 03/28/2019  . Acute pain of left knee 04/18/2018  . Cutaneous skin tags 04/18/2018  . Lower extremity edema 04/18/2018  . Thoracic outlet syndrome 02/19/2018  . Pain in limb 02/19/2018  . Muscle strain 09/01/2017  . Chest pain 03/21/2017  . Dyspnea on exertion 02/19/2017  . Language difficulty 02/19/2017  . BPH with obstruction/lower urinary tract symptoms 12/03/2015  . Medication refill 09/13/2015  . Erectile disorder, generalized, mild 07/26/2015  . Hypogonadism in male 05/24/2015  . Environmental allergies 04/13/2015  . Sleep apnea 04/13/2015  . Migraines 04/13/2015  . Neck pain 04/13/2015  . Adjustment disorder with mixed anxiety and depressed mood 04/13/2015  . Psoriasis 04/13/2015  . History of kidney stones 04/13/2015  . Neuropathy 04/13/2015  . Benign fibroma of prostate 03/24/2015  . Hypertension 03/24/2015  . Elevation of level of transaminase or lactic acid dehydrogenase (LDH) 03/24/2015  . Fatigue 03/24/2015   Past Medical History:  Past Medical History:  Diagnosis Date  . Allergy    Seasonal  . Anxiety   . Benign enlargement of prostate   . Depression   . Elevated BP   . Elevated transaminase level   . Failure of erection   . Fatigue   . Frequent headaches   . GERD (gastroesophageal reflux disease)   . History of kidney stones   . Hypertension   . Hypogonadism in male   . Migraine   . Sinus congestion 09/01/2017  . Sleep apnea    Currently uses  cpap  . Thoracic outlet syndrome    Left Arm  . Tongue pain 02/09/2019   Past Surgical History:  Past Surgical History:  Procedure Laterality Date  . COLONOSCOPY WITH PROPOFOL N/A 11/11/2017   Procedure: COLONOSCOPY WITH PROPOFOL;  Surgeon: Lin Landsman, MD;  Location: [place];  Service: Endoscopy;  Laterality: N/A;  . ESOPHAGOGASTRODUODENOSCOPY (EGD) WITH PROPOFOL N/A 08/25/2019   Procedure: ESOPHAGOGASTRODUODENOSCOPY (EGD) WITH PROPOFOL;  Surgeon: Lin Landsman, MD;  Location: [facility] ENDOSCOPY;  Service: Gastroenterology;  Laterality: N/A;  . KNEE ARTHROSCOPY WITH MEDIAL MENISECTOMY Left 07/26/2018   Procedure: KNEE ARTHROSCOPY WITH PARTIAL MEDIAL MENISECTOMY;  Surgeon: Leim Fabry, MD;  Location: [facility] ORS;  Service: Orthopedics;  Laterality: Left;  . Left Shoulder Surgery  2011  . nerve block     2 in neck. 5 in back.  . NOSE SURGERY    . POLYPECTOMY  11/11/2017   Procedure: POLYPECTOMY;  Surgeon: Lin Landsman, MD;  Location: [place];  Service: Endoscopy;;  . SCALENE NODE BIOPSY / EXCISION    . VASECTOMY     HPI:  Daniel Calderon is a 53 y.o. male with history of thoracic outlet syndrome, sleep apnea, migraine headaches, hypertension, fatigue, erectile dysfunction, depression, anxiety.  Patient presented to [place] secondary to sudden onset of chest pain, left arm pain and left leg pain.  This was followed by a decrease sensation on the left side along with intermittent periods of left hand flexion.  Patient also states that his  left arm and leg felt weak.  Patient was administered TPA at that time.   Assessment / Plan / Recommendation Clinical Impression  Pt was seen for a cognitive-linguistic evaluation and he completed the [facility] Cognitive Assessment [facility]) in addition to informal evaluation measures.  Pt reported that he lives with his wife and daughter and that he works full time.  He additionally stated that he is independent  with all ADLs and IADLs (medications, finances, etc.) at baseline and his wife corroborated this statement.  Pt scored overall 27/30 on the MOCA (norm >/=26/30) indicating functional cognitive-linguistic abilities.  No expressive or receptive language deficits were observed and speech was >95% intelligible.   No further skilled ST is recommended at this time.  Please re-consult if additional needs arise.     SLP Assessment  SLP Recommendation/Assessment: Patient does not need any further Speech Lanaguage Pathology Services SLP Visit Diagnosis: Cognitive communication deficit (R41.841)    Follow Up Recommendations  None          SLP Evaluation Cognition  Overall Cognitive Status: Within Functional Limits for tasks assessed Arousal/Alertness: Awake/alert Orientation Level: Oriented X4 Attention: Sustained Sustained Attention: Appears intact Memory: Impaired Memory Impairment: Decreased short term memory Decreased Short Term Memory: Verbal complex Awareness: Appears intact Problem Solving: Appears intact Executive Function: Organizing;Reasoning Reasoning: Appears intact Organizing: Appears intact Safety/Judgment: Appears intact       Comprehension  Auditory Comprehension Overall Auditory Comprehension: Appears within functional limits for tasks assessed    Expression Expression Primary Mode of Expression: Verbal Verbal Expression Overall Verbal Expression: Appears within functional limits for tasks assessed   Oral / Motor  Oral Motor/Sensory Function Overall Oral Motor/Sensory Function: Within functional limits Motor Speech Overall Motor Speech: Appears within functional limits for tasks assessed   GO                   Colin Mulders M.S., [place] Office: [phone]  Elvia Collum Maila Dukes 12/07/2019, 2:53 PM

## 2019-12-07 NOTE — Evaluation (Signed)
Occupational Therapy Evaluation Patient Details Name: Daniel Calderon MRN: [id number] DOB: 07-01-1966 Today's Date: 12/07/2019    History of Present Illness Daniel Calderon is a 53 y.o. male with history of thoracic outlet syndrome, sleep apnea, migraine headaches, hypertension, fatigue, erectile dysfunction, depression, anxiety.  Patient presented to [place] secondary to sudden onset of chest pain, left arm pain and left leg pain.  This was followed by a decrease sensation on the left side along with intermittent periods of left hand flexion.  Patient also states that his left arm and leg felt weak.  Patient was administered TPA at that time.   Clinical Impression   Pt PTA: living with spouse and independent; working. Pt currently limited by headache and soreness in neck. Pt ambulating in room with  no AD; pt grooming at sink with no difficulty and performing figure 4 technique for LB ADL. Pt education on log roll technique and limiting overhead use as cervical spine showing R lateral disc protrusion and impinging on C7. Pt with no focal deficits. LUE AROM, is WFLs and MMT 5/5. Pt advised to abide by cervical precautions to avoid overhead movements, log roll for bed mobility, and avoid bending for lower body ADL instead, sitting EOB for crossing leg technique to figure 4. Plan to see neurosurgeon. Pt does not require continued OT skilled services. OT signing off.    Follow Up Recommendations  No OT follow up    Equipment Recommendations  None recommended by OT    Recommendations for Other Services       Precautions / Restrictions Precautions Precautions: None Restrictions Weight Bearing Restrictions: No      Mobility Bed Mobility Overal bed mobility: Modified Independent             General bed mobility comments: Education for log roll technique to reduce rotation on spine  Transfers Overall transfer level: Modified independent                     Balance Overall balance assessment: Modified Independent                               Standardized Balance Assessment Standardized Balance Assessment : Berg Balance Test Berg Balance Test Sit to Stand: Able to stand without using hands and stabilize independently Standing Unsupported: Able to stand safely 2 minutes Sitting with Back Unsupported but Feet Supported on Floor or Stool: Able to sit safely and securely 2 minutes Stand to Sit: Sits safely with minimal use of hands Transfers: Able to transfer safely, minor use of hands Standing Unsupported with Eyes Closed: Able to stand 10 seconds safely Standing Ubsupported with Feet Together: Able to place feet together independently and stand 1 minute safely From Standing Position, Pick up Object from Floor: Able to pick up shoe safely and easily From Standing Position, Turn to Look Behind Over each Shoulder: Looks behind from both sides and weight shifts well Turn 360 Degrees: Able to turn 360 degrees safely in 4 seconds or less       ADL either performed or assessed with clinical judgement   ADL Overall ADL's : At baseline;Modified independent                                       General ADL Comments: Pt ambulating in room with  no  AD; pt grooming at sink with no difficulty and performing figure 4 technique for LB ADL. Pt education on log roll technique and limiting overhead use as cervical spine is affected.     Vision Baseline Vision/History: Wears glasses Wears Glasses: Reading only Patient Visual Report: No change from baseline Vision Assessment?: No apparent visual deficits     Perception     Praxis      Pertinent Vitals/Pain Pain Assessment: Faces Faces Pain Scale: Hurts a little bit Pain Location: headache Pain Descriptors / Indicators: Headache Pain Intervention(s): Limited activity within patient's tolerance     Hand Dominance Right   Extremity/Trunk Assessment Upper Extremity  Assessment Upper Extremity Assessment: Generalized weakness;LUE deficits/detail LUE Deficits / Details: LUE, AROM is WFLs and 5/5 strength; FMC is intact   Lower Extremity Assessment Lower Extremity Assessment: Overall WFL for tasks assessed RLE Deficits / Details: WFL, gross strength appears weaker than isolated testing., LLE Deficits / Details: WFL, mildly weaker than R LE, gross extension appears >=4/5   Cervical / Trunk Assessment Cervical / Trunk Assessment: Normal   Communication Communication Communication: No difficulties   Cognition Arousal/Alertness: Awake/alert Behavior During Therapy: WFL for tasks assessed/performed Overall Cognitive Status: Within Functional Limits for tasks assessed                                     General Comments  Spouse present in room    Exercises     Shoulder Instructions      Home Living Family/patient expects to be discharged to:: Private residence Living Arrangements: Spouse/significant other Available Help at Discharge: Family Type of Home: House Home Access: Stairs to enter Technical brewer of Steps: several Entrance Stairs-Rails: Right;Left Home Layout: One level               Home Equipment: None      Lives With: Spouse;Family    Prior Functioning/Environment Level of Independence: Independent        Comments: works in Theatre manager. drives        OT Problem List: Decreased activity tolerance;Pain      OT Treatment/Interventions:      OT Goals(Current goals can be found in the care plan section) Acute Rehab OT Goals Patient Stated Goal: to go home OT Goal Formulation: With patient  OT Frequency:     Barriers to D/C:            Co-evaluation              AM-PAC OT "6 Clicks" Daily Activity     Outcome Measure Help from another person eating meals?: None Help from another person taking care of personal grooming?: None Help from another person toileting, which includes  using toliet, bedpan, or urinal?: None Help from another person bathing (including washing, rinsing, drying)?: None Help from another person to put on and taking off regular upper body clothing?: None Help from another person to put on and taking off regular lower body clothing?: None 6 Click Score: 24   End of Session Nurse Communication: Mobility status  Activity Tolerance: Patient tolerated treatment well Patient left: in bed;with call bell/phone within reach;with family/visitor present  OT Visit Diagnosis: Unsteadiness on feet (R26.81);Pain Pain - Right/Left: Left Pain - part of body: Arm                Time: 1510-1525 OT Time Calculation (min): 15 min Charges:  OT General  Charges $OT Visit: 1 Visit OT Evaluation $OT Eval Moderate Complexity: 1 Mod  Darryl Nestle) Marsa Aris OTR/L Acute Rehabilitation Services Pager: [phone] Office: [phone]   Audie Pinto 12/07/2019, 3:25 PM

## 2019-12-08 ENCOUNTER — Telehealth: Payer: Self-pay

## 2019-12-08 NOTE — Telephone Encounter (Signed)
Called patient for transitional care management and schedule hospital follow up with PMD.  Patient states, "I am okay for now and following up with [facility] in [facility] 12/28/19. Afterwards, I would like to continue care with Dr.Shah and may need to reach out to Dr. Caryl Bis for a referral."  Hospital follow up with PMD declined at this time. Patient encouraged to call the office if new symptoms present or worsen.

## 2019-12-08 NOTE — Telephone Encounter (Signed)
Noted  

## 2019-12-09 ENCOUNTER — Other Ambulatory Visit: Payer: Self-pay | Admitting: *Deleted

## 2019-12-09 NOTE — Patient Outreach (Signed)
[facility]) Care Management  12/09/2019  Daniel Calderon 11-27-1966 [id number]   Transition of care telephone call  Referral received:12/07/19 Initial outreach:12/09/19 Insurance: [facility] plan   Initial unsuccessful telephone call to patient's preferred number in order to complete transition of care assessment; no answer, left HIPAA compliant voicemail message requesting return call.   Objective: Per the electronic medical record, Daniel Calderon  was hospitalized at [facility]  from 12/15-12/16/20 with/for left sided weakness, stroke like symptoms, s/p TPA,Radiculopathy cervical and lumbar  . Comorbidities include: Hypertension, depression , anxiety, Obstructive sleep apnea on CPAP, Hyperlipidemia, Migraines,  He was discharged to home on 12/16 /20 without the need for home health services or durable medical equipment per the discharge summary.   Plan: This RNCM will route unsuccessful outreach letter with [place] Management pamphlet and 24 hour Nurse Advice Line Magnet to [place] Management clinical pool to be mailed to patient's home address. This RNCM will attempt another outreach within 4 business days.   Joylene Draft, RN, [place] Management Coordinator  [phone]- Mobile [phone]- Toll Free Main Office

## 2019-12-14 ENCOUNTER — Other Ambulatory Visit: Payer: Self-pay | Admitting: *Deleted

## 2019-12-14 NOTE — Patient Outreach (Signed)
[facility]) Care Management  12/14/2019  LERON LOSIER October 09, 1966 [id number]   Transition of care call  Referral received: 12/07/19 Initial outreach attempt: 12/09/19 Insurance: [place]   2nd unsuccessful telephone call to patient's preferred contact number in order to complete post hospital discharge transition of care assessment , no answer, no voicemail pickup, unable to leave a  HIPAA compliant message requesting return call.    Objective:  Per the electronic medical record, Mr. Isa  was hospitalized at [facility]  from 12/15-12/16/20 with/for left sided weakness, stroke like symptoms, s/p TPA,Radiculopathy cervical and lumbar  . Comorbidities include: Hypertension, depression , anxiety, Obstructive sleep apnea on CPAP, Hyperlipidemia, Migraines,  He was discharged to home on 12/16 /20 without the need for home health services or durable medical equipment per the discharge summary.      Plan If no return call from patient will attempt 3rd outreach in the next 4 business days.   Joylene Draft, RN, [place] Management Coordinator  [phone]- Mobile [phone]- Toll Free Main Office

## 2019-12-19 ENCOUNTER — Other Ambulatory Visit: Payer: Self-pay | Admitting: *Deleted

## 2019-12-19 ENCOUNTER — Other Ambulatory Visit: Payer: Self-pay | Admitting: Gastroenterology

## 2019-12-19 DIAGNOSIS — R1013 Epigastric pain: Secondary | ICD-10-CM

## 2019-12-19 NOTE — Patient Outreach (Signed)
[facility]) Care Management  12/19/2019  EWEL IKNER 23-Feb-1966 [id number]   Transition of care call Referral received: 12/07/19 Initial outreach attempt: 12/09/19 Insurance: [place] unsuccessful telephone call to patient's preferred contact number in order to complete post hospital discharge transition of care assessment; no answer, left HIPAA compliant message requesting return call.   Objective: Per the electronic medical record, Mr. Hill hospitalized at [facility] 12/15-12/16/20 with/for left sided weakness, stroke like symptoms, s/p TPA,Radiculopathy cervical and lumbar. Comorbidities include: Hypertension, depression , anxiety, Obstructive sleep apnea on CPAP, Hyperlipidemia, Migraines,He was discharged to home on 12/07/19 without the need for home health services or durable medical equipment per the discharge summary.    Plan: If no return call from patient, will close case to [place] Management services in 10 business days after initial post hospital discharge outreach, 12/09/19  Joylene Draft, RN, [place] Management Coordinator  [phone]- Mobile [phone]- [place]

## 2019-12-20 ENCOUNTER — Other Ambulatory Visit: Payer: Self-pay

## 2019-12-22 ENCOUNTER — Other Ambulatory Visit: Payer: Self-pay | Admitting: *Deleted

## 2019-12-22 MED ORDER — OMEPRAZOLE 20 MG PO CPDR: 20.0000 mg | DELAYED_RELEASE_CAPSULE | Freq: Every day | ORAL | 2 refills | Status: DC

## 2019-12-22 NOTE — Patient Outreach (Addendum)
[facility]) Care Management  12/22/2019  Daniel Calderon [birth year] [id number]   Transition of care /Case Closure Unsuccessful outreach    Referral received:12/07/19 Initial outreach:12/09/19 Insurance: [employer]    Unable to complete post hospital discharge transition of care assessment. No return call form patient after 3 call attempts and no response to request to contact RN Care Coordinator in unsuccessful outreach letter mailed to home on 12/09/19.  Objective: Per the electronic medical record, Mr. Schwartzenberge hospitalized at [facility] 12/15-12/16/20 with/for left sided weakness, stroke like symptoms, s/p TPA,Radiculopathy cervical and lumbar. Comorbidities include: Hypertension, depression , anxiety, Obstructive sleep apnea on CPAP, Hyperlipidemia, Migraines,He was discharged to home on 12/07/19 without the need for home health services or durable medical equipment per the discharge summary.  Plan Case closed to [employer] as it has been 10 days since initial post discharge outreach attempt.   Joylene Draft, RN, [place] Management Coordinator  [phone]- Mobile [phone]- Toll Free Main Office

## 2019-12-27 ENCOUNTER — Encounter: Payer: Self-pay | Admitting: Neurology

## 2019-12-27 NOTE — Progress Notes (Signed)
PROVIDER NEEDED TO RESCHEDULE PATIENT.   History of Present Illness:  Daniel Calderon is a 54 year old right-handed male with hypogonadism, sleep apnea, migraines and cervical radiculopathy due to history of cervical spine injury with multiple surgeries whom I previously saw in May 2018 for possible TIA, presents today with cervical radiculopathy and stroke-like symptoms.  Patient reports history of left sided thoracic outlet syndrome, for which he had surgery back in 2011.  He also had left sided neck and shoulder pain.  At that time, he underwent a cervical nerve root block, which caused residual numbness in the back of his head on the left.  Occasionally, he reports transient left sided numbness of the face, neck and upper extremity.  It usually lasts seconds to minutes.  He has some chronic posterior head and neck pain.  There are no radicular symptoms or weakness of the left upper extremity.  In March 2017, a beam fell on the top of his head.  He did not lose consciousness but reports speech difficulty since then.  He knows what he wants to say but has trouble getting words out.  It has been brought to his attention that he seems to have increased trouble with word production when he talks to people in his left field of vision.  However, he talks more fluently when people move to his right side of vision.  He also reports difficulty concentrating and organizing.  He endorses some short-term memory deficits.  However, none of these symptoms impede his ability to function.  He underwent neuropsychological testing on 01/06/2018 which demonstrated no evidence of cognitive impairment and questionable somatoform disorder suspected.  He was admitted to [facility] from 02/19/17 to 02/20/17 for evaluation of left upper extremity and facial numbness.  He was in his PCP's office for a check up when he suddenly endorsed shortness of breath, chest discomfort and the left sided facial and upper  extremity numbness, lasting about 3 minutes.  He underwent a stroke workup.  MRI and MRA of head were personally reviewed and did not demonstrate acute infarction or significant arterial stenosis or occlusion.  Carotid doppler revealed no hemodynamically significant stenosis.  2D echo demonstrated LV EF 55-60% with no cardiac source of emboli.  LDL was 108.  Hgb A1c was 5.3.  During that admission, he exhibited a recurrent episode of left sided numbness, but also including the leg.  He had associated palpitations, chest pain and diaphoresis.  He was given NTG and symptoms resolved after 10 minutes.  Cardiac workup was unremarkable.  He was treated for possible TIA (ASA and statin therapy), however it was entertained that the symptoms were secondary to his chronic cervical disc disease or sequelae of migraine.  He had an outpatient stress test on 03/31/17, which was normal.  As these were recurrent events, I believed that a cerebrovascular event was highly unlikely (given the negative MRIs).   He presented to [facility] on 05/25/2018 for left sided facial numbness and intermittent left arm tingling associated with some dizziness.  MRI of brain was unremarkable.  He has long-term history of bilateral leg weakness as well as lower extremity neuralgia which has been extensively worked up in the past.  NCV-EMG of lower extremities from 09/04/2015 showed chronic mild left S1 radiculopathy but otherwise unremarkable.  MRI of lumbar spine from 03/11/2018 showed degenerative disc disease with shallow left disc prtrusion at L3-4 potentially irritating left L3 nerve root, mild to moderate bilateral L5  foraminal narrowing and right disc bulge at L2-3.  He has since been evaluated by another neurologist, Dr. Manuella Ghazi at the [facility] in 2020 for generalized muscle weakness and sensation of muscle tightness.  Myasthenia panel was negative.  B12 was 1,087, ACE 55, heavy metal screen negative, SPEP/IFE  negative for M spike, HIV negative  He is followed by Dr. Brett Fairy at [facility] for OSA.    He was admitted to the hospital on 12/06/2019 for sudden onset chest pain with left arm and leg pain and numbness as well as intermittent left hand flexion.  He presented to [facility] as a stroke code where he received tPA and was transferred to [facility].  MRI of brain was negative for acute stroke.  CTA of head and neck showed no significant vessel stenosis or occlusion.  2D echo showed EF 50-55% with no cardiac source of embolus.  LDL 137 and Hgb A1c was 5.7.  TIA was felt to be unlikely.  MRI of cervical spine demonstrated spondylosis most notable at C6-7 with right lateral disc protrusion causing right C7 nerve root impingement but also demonstrated severe left C5-6 neural foraminal stenosis and moderate to severe bilateral neural foraminal stenosis at C6-7.Marland Kitchen

## 2019-12-28 ENCOUNTER — Telehealth (INDEPENDENT_AMBULATORY_CARE_PROVIDER_SITE_OTHER): Payer: No Typology Code available for payment source | Admitting: Neurology

## 2019-12-28 ENCOUNTER — Other Ambulatory Visit: Payer: Self-pay

## 2020-01-10 NOTE — Progress Notes (Signed)
NEUROLOGY CONSULTATION NOTE  Daniel Calderon MRN: [id number] DOB: Oct 03, 1966  Referring provider: Tommi Rumps, MD Primary care provider: Tommi Rumps, MD  Reason for consult:  Transient left sided numbness, recurrent; chronic and persistent left sided numbness and bilateral lower extremity numbness.  HISTORY OF PRESENT ILLNESS: Daniel Calderon is a 66 year oldright-handed male with hypogonadism, sleep apnea, migraines and cervical radiculopathy due to history of cervical spine injury with multiple surgeries whom I previously saw in May 2018 for possible TIA, presents today with cervical radiculopathy and stroke-like symptoms.  Patient reports history of left sided thoracic outlet syndrome, for which he had surgery back in 2011. He also had left sided neck and shoulder pain. At that time, he underwent a cervical nerve root block, which caused residual numbness in the back of his head on the left. Occasionally, he reports transient left sided numbness of the face, neck and upper extremity. It usually lasts seconds to minutes. He has some chronic posterior head and neck pain. There are no radicular symptoms or weakness of the left upper extremity. In March 2017, a beam fell on the top of his head. He did not lose consciousness but reports speech difficulty since then. He knows what he wants to say but has trouble getting words out. It has been brought to his attention that he seems to have increased trouble with word production when he talks to people in his left field of vision. However, he talks more fluently when people move to his right side of vision. He also reports difficulty concentrating and organizing. He endorses some short-term memory deficits. However, none of these symptoms impede his ability to function.  He underwent neuropsychological testing on 01/06/2018 which demonstrated no evidence of cognitive impairment and questionable somatoform disorder  suspected.  He was admitted to [facility] from 02/19/17 to 02/20/17 for evaluation of left upper extremity and facial numbness.He was in his PCP's office for a check up when he suddenly endorsed shortness of breath, chest discomfort and the left sided facial and upper extremity numbness, lasting about 3 minutes. He underwent a stroke workup. MRI and MRA of head were personally reviewed and did not demonstrate acute infarction or significant arterial stenosis or occlusion. Carotid doppler revealed no hemodynamically significant stenosis. 2D echo demonstrated LV EF 55-60% with no cardiac source of emboli. LDL was 108. Hgb A1c was 5.3. During that admission, he exhibited a recurrent episode of left sided numbness, but also including the leg. He had associated palpitations, chest pain and diaphoresis. He was given NTG and symptoms resolved after 10 minutes. Cardiac workup was unremarkable. He was treated for possible TIA (ASA and statin therapy), however it was entertained that the symptoms were secondary to his chronic cervical disc disease or sequelae of migraine. He had an outpatient stress test on 03/31/17, which was normal.  As these were recurrent events, I believed that a cerebrovascular event was highly unlikely (given the negative MRIs).   He presented to [facility] on 05/25/2018 for left sided facial numbness and intermittent left arm tingling associated with some dizziness.  MRI of brain was unremarkable.  He has long-term history of bilateral leg weakness as well as lower extremity neuralgia which has been extensively worked up in the past.  NCV-EMG of lower extremities from 09/04/2015 showed chronic mild left S1 radiculopathy but otherwise unremarkable.  MRI of lumbar spine from 03/11/2018 showed degenerative disc disease with shallow left disc prtrusion at L3-4 potentially irritating  left L3 nerve root, mild to moderate bilateral L5 foraminal  narrowing and right disc bulge at L2-3.  He has since been evaluated by another neurologist, Dr. Manuella Ghazi at the [facility] in 2020 for generalized muscle weakness and sensation of muscle tightness.  Myasthenia panel was negative.  B12 was 1,087, ACE 55, heavy metal screen negative, SPEP/IFE negative for M spike, HIV negative  He is followed by Dr. Brett Fairy at [facility] for OSA.    He was admitted to the hospital on 12/06/2019 for similar event in 2018.  He developed sudden onset chest pain and left hand pain that radiated down the left side of his body and down his left leg.  He noted left hemisensory loss involving the face, arm, torso, and leg, similar to 2018.  This occurred when he reached up with his left arm to grab for a lightbulb and his hand cramped and curled, radiating down the left side of his body and down the leg.  He presented to [facility] as a stroke code where he received tPA and was transferred to [facility].  MRI of brain was negative for acute stroke.  CTA of head and neck showed no significant vessel stenosis or occlusion.  2D echo showed EF 50-55% with no cardiac source of embolus.  LDL 137 and Hgb A1c was 5.7.  TIA was felt to be unlikely.  MRI of cervical spine demonstrated spondylosis most notable at C6-7 with right lateral disc protrusion causing right C7 nerve root impingement but also demonstrated severe left C5-6 neural foraminal stenosis and moderate to severe bilateral neural foraminal stenosis at C6-7.  It was suspected that his symptoms were related to cervical and lumbar radiculopathy.  The significant hemisensory loss lasted 9 hours.  However, he continues to have some decreased sensation in the left arm and torso.  He also has numbness of both lower extremities up to his inguinal region.  PAST MEDICAL HISTORY: Past Medical History:  Diagnosis Date   Allergy    Seasonal   Anxiety    Benign enlargement of  prostate    Depression    Elevated BP    Elevated transaminase level    Failure of erection    Fatigue    Frequent headaches    GERD (gastroesophageal reflux disease)    History of kidney stones    Hypertension    Hypogonadism in male    Migraine    Sinus congestion 09/01/2017   Sleep apnea    Currently uses cpap   Thoracic outlet syndrome    Left Arm   Tongue pain 02/09/2019    PAST SURGICAL HISTORY: Past Surgical History:  Procedure Laterality Date   COLONOSCOPY WITH PROPOFOL N/A 11/11/2017   Procedure: COLONOSCOPY WITH PROPOFOL;  Surgeon: Lin Landsman, MD;  Location: [place];  Service: Endoscopy;  Laterality: N/A;   ESOPHAGOGASTRODUODENOSCOPY (EGD) WITH PROPOFOL N/A 08/25/2019   Procedure: ESOPHAGOGASTRODUODENOSCOPY (EGD) WITH PROPOFOL;  Surgeon: Lin Landsman, MD;  Location: [place];  Service: Gastroenterology;  Laterality: N/A;   KNEE ARTHROSCOPY WITH MEDIAL MENISECTOMY Left 07/26/2018   Procedure: KNEE ARTHROSCOPY WITH PARTIAL MEDIAL MENISECTOMY;  Surgeon: Leim Fabry, MD;  Location: [facility];  Service: Orthopedics;  Laterality: Left;   Left Shoulder Surgery  2011   nerve block     2 in neck. 5 in back.   NOSE SURGERY     POLYPECTOMY  11/11/2017   Procedure: POLYPECTOMY;  Surgeon: Lin Landsman, MD;  Location: [place];  Service: Endoscopy;;   SCALENE NODE BIOPSY / EXCISION     VASECTOMY      MEDICATIONS: Current Outpatient Medications on File Prior to Visit  Medication Sig Dispense Refill   amLODipine (NORVASC) 10 MG tablet Take 0.5 tablets (5 mg total) by mouth daily. 45 tablet 2   atorvastatin (LIPITOR) 40 MG tablet Take 1 tablet (40 mg total) by mouth daily at 6 PM. 30 tablet 2   cyanocobalamin (,VITAMIN B-12,) 1000 MCG/ML injection Inject 1,000 mcg into the muscle every 30 (thirty) days.      Multiple Vitamin (MULTIVITAMIN) tablet Take 1 tablet by mouth daily.     omeprazole (PRILOSEC) 20  MG capsule Take 1 capsule (20 mg total) by mouth daily. 30 capsule 2   sertraline (ZOLOFT) 100 MG tablet TAKE 1 TABLET BY MOUTH ONCE DAILY. (Patient taking differently: Take 100 mg by mouth daily. ) 90 tablet 1   testosterone (ANDROGEL) 50 MG/5GM (1%) GEL Place 5 g onto the skin daily. 30 Tube 3   No current facility-administered medications on file prior to visit.    ALLERGIES: Allergies  Allergen Reactions   Erythromycin Nausea And Vomiting   Cephalexin Other (See Comments)    Unknown   Dexamethasone Other (See Comments)    Unknown   Oxycodone-Acetaminophen Other (See Comments)    Unknown   Prednisone Other (See Comments)    Unknown   Gabapentin Other (See Comments)    Aggressive   Ibuprofen Itching and Other (See Comments)    Can take in low doses per pt    FAMILY HISTORY: Family History  Problem Relation Age of Onset   Hypertension Mother        Living   Hearing loss Mother    Heart disease Other    Healthy Father        Living   Diabetes Brother    Stroke Paternal Uncle    Heart attack Paternal Uncle    Hearing loss Maternal Grandmother    Healthy Son    Healthy Daughter    Kidney cancer Neg Hx    Bladder Cancer Neg Hx    Prostate cancer Neg Hx     SOCIAL HISTORY: Social History   Socioeconomic History   Marital status: Married    Spouse name: Not on file   Number of children: 2   Years of education: 13   Highest education level: Not on file  Occupational History   Not on file  Tobacco Use   Smoking status: Never Smoker   Smokeless tobacco: Never Used  Substance and Sexual Activity   Alcohol use: Yes    Alcohol/week: 3.0 standard drinks    Types: 3 Cans of beer per week   Drug use: No   Sexual activity: Yes    Partners: Female    Comment: Wife  Other Topics Concern   Not on file  Social History Narrative   [facility]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    Lives with wife and daughter (56)   High school and tech school   Caffeine- 2-3  coffee    Enjoys- yard work, Location manager- 2 dogs    Right handed   One story home   Social Determinants of Health   Financial Resource Strain:    Difficulty of Paying Living Expenses: Not on file  Food Insecurity:    Worried About Charity fundraiser in the Last Year: Not on file   [employer] of  Food in the Last Year: Not on file  Transportation Needs:    Lack of Transportation (Medical): Not on file   Lack of Transportation (Non-Medical): Not on file  Physical Activity:    Days of Exercise per Week: Not on file   Minutes of Exercise per Session: Not on file  Stress:    Feeling of Stress : Not on file  Social Connections:    Frequency of Communication with Friends and Family: Not on file   Frequency of Social Gatherings with Friends and Family: Not on file   Attends Religious Services: Not on file   Active Member of [employer] or Organizations: Not on file   Attends Archivist Meetings: Not on file   Marital Status: Not on file  Intimate Partner Violence:    Fear of Current or Ex-Partner: Not on file   Emotionally Abused: Not on file   Physically Abused: Not on file   Sexually Abused: Not on file    REVIEW OF SYSTEMS: Constitutional: No fevers, chills, or sweats, no generalized fatigue, change in appetite Eyes: No visual changes, double vision, eye pain Ear, nose and throat: No hearing loss, ear pain, nasal congestion, sore throat Cardiovascular: No chest pain, palpitations Respiratory:  No shortness of breath at rest or with exertion, wheezes GastrointestinaI: No nausea, vomiting, diarrhea, abdominal pain, fecal incontinence Genitourinary:  No dysuria, urinary retention or frequency Musculoskeletal:  No neck pain, back pain Integumentary: No rash, pruritus, skin lesions Neurological: as above Psychiatric: No depression, insomnia, anxiety Endocrine: No palpitations, fatigue, diaphoresis, mood swings, change in appetite, change in weight,  increased thirst Hematologic/Lymphatic:  No purpura, petechiae. Allergic/Immunologic: no itchy/runny eyes, nasal congestion, recent allergic reactions, rashes  PHYSICAL EXAM: Blood pressure 134/89, pulse 73, temperature (!) 97 F (36.1 C), height 6\' 2"  (1.88 m), weight 209 lb 6.4 oz (95 kg), SpO2 97 %. General: No acute distress.  Patient appears well-groomed.  Head:  Normocephalic/atraumatic Eyes:  fundi examined but not visualized Neck: supple, no paraspinal tenderness, full range of motion Back: No paraspinal tenderness Heart: regular rate and rhythm Lungs: Clear to auscultation bilaterally. Vascular: No carotid bruits. Neurological Exam: Mental status: alert and oriented to person, place, and time, recent and remote memory intact, fund of knowledge intact, attention and concentration intact, speech fluent and not dysarthric, language intact. Cranial nerves: CN I: not tested CN II: pupils equal, round and reactive to light, visual fields intact CN III, IV, VI:  full range of motion, no nystagmus, no ptosis CN V: facial sensation intact CN VII: upper and lower face symmetric CN VIII: hearing intact CN IX, X: gag intact, uvula midline CN XI: sternocleidomastoid and trapezius muscles intact CN XII: tongue midline Bulk & Tone: normal, no fasciculations. Motor:  5/5 throughout Sensation:  Pinprick sensation reduced in left upper extremity, lower half of left lateral torso and bilateral lower extremities; and vibration sensation intact.. Deep Tendon Reflexes:  2+ throughout, toes downgoing. Finger to nose testing:  Without dysmetria.  Heel to shin:  Without dysmetria.  Gait:  Normal station and stride.  Able to turn and tandem walk. Romberg negative.  IMPRESSION: 1.  Transient left sided numbness.  Recurrent.  I do not believe he had a cerebrovascular event, as these are recurrent events since his thoracic outlet surgery in 2011.  While in the hospital in 2018, he had a recurrent  episode which was associated with palpitations, diaphoresis and chest pain, raising the possibility of anxiety but I am really unsure.  He continues to have left sided numbness and bilateral lower extremity numbness.  This cannot be explained by cervical and lumbar radiculopathy, given the involvement of the left side of face and entire left side of body, including his torso.  MRI of cervical spine did not demonstrate evidence of myelopathy.  Unfortunately, I do not have an explanation for his symptoms.  He has seen several community neurologists in the outpatient and inpatient setting.  I would recommend that Dr. Caryl Bis consider referral to one of the academic centers for further evaluation.   Thank you for allowing me to take part in the care of this patient.  Metta Clines, DO  CC: Tommi Rumps, MD

## 2020-01-11 ENCOUNTER — Ambulatory Visit: Payer: No Typology Code available for payment source | Admitting: Neurology

## 2020-01-11 ENCOUNTER — Other Ambulatory Visit: Payer: Self-pay

## 2020-01-11 ENCOUNTER — Encounter: Payer: Self-pay | Admitting: Neurology

## 2020-01-11 VITALS — BP 134/89 | HR 73 | Temp 97.0°F | Ht 74.0 in | Wt 209.4 lb

## 2020-01-11 DIAGNOSIS — R2990 Unspecified symptoms and signs involving the nervous system: Secondary | ICD-10-CM | POA: Diagnosis not present

## 2020-01-11 DIAGNOSIS — R200 Anesthesia of skin: Secondary | ICD-10-CM | POA: Diagnosis not present

## 2020-01-11 NOTE — Patient Instructions (Signed)
Unfortunately, I do not know why you have these symptoms.  You do have evidence of pinched nerves in the neck and back but that wouldn't explain your type of numbness.  I would recommend that Dr. Caryl Bis refer you to an academic center for evaluation (such as [facility], [place] or [place])

## 2020-02-08 ENCOUNTER — Telehealth: Payer: Self-pay | Admitting: Urology

## 2020-02-08 DIAGNOSIS — R7989 Other specified abnormal findings of blood chemistry: Secondary | ICD-10-CM

## 2020-02-08 DIAGNOSIS — E291 Testicular hypofunction: Secondary | ICD-10-CM

## 2020-02-08 NOTE — Telephone Encounter (Signed)
Pt needs testosterone refill sent to [place]. Please advise.

## 2020-02-09 MED ORDER — TESTOSTERONE 50 MG/5GM (1%) TD GEL: 5.0000 g | Freq: Every day | TRANSDERMAL | 3 refills | Status: DC

## 2020-02-10 ENCOUNTER — Other Ambulatory Visit: Payer: Self-pay | Admitting: Urology

## 2020-02-10 DIAGNOSIS — R7989 Other specified abnormal findings of blood chemistry: Secondary | ICD-10-CM

## 2020-02-10 DIAGNOSIS — E291 Testicular hypofunction: Secondary | ICD-10-CM

## 2020-02-13 ENCOUNTER — Other Ambulatory Visit: Payer: Self-pay

## 2020-02-13 ENCOUNTER — Encounter: Payer: Self-pay | Admitting: Family Medicine

## 2020-02-13 ENCOUNTER — Ambulatory Visit (INDEPENDENT_AMBULATORY_CARE_PROVIDER_SITE_OTHER): Payer: No Typology Code available for payment source | Admitting: Family Medicine

## 2020-02-13 DIAGNOSIS — I10 Essential (primary) hypertension: Secondary | ICD-10-CM | POA: Diagnosis not present

## 2020-02-13 DIAGNOSIS — R200 Anesthesia of skin: Secondary | ICD-10-CM | POA: Diagnosis not present

## 2020-02-13 DIAGNOSIS — E785 Hyperlipidemia, unspecified: Secondary | ICD-10-CM

## 2020-02-13 DIAGNOSIS — F4323 Adjustment disorder with mixed anxiety and depressed mood: Secondary | ICD-10-CM | POA: Diagnosis not present

## 2020-02-13 NOTE — Assessment & Plan Note (Signed)
-   Continue amlodipine ?

## 2020-02-13 NOTE — Assessment & Plan Note (Signed)
Symptoms have resolved at this time.  I discussed with the patient that overall his symptoms seem consistent with a stroke though one was not found on work-up.  Discussed that it would not seem to be related to a cervical lesion given that his face was involved.  I agree with Dr. Tomi Likens that referral to a tertiary care center is appropriate at this time.  Referral will be placed.

## 2020-02-13 NOTE — Progress Notes (Signed)
Virtual Visit via video Note  This visit type was conducted due to national recommendations for restrictions regarding the COVID-19 pandemic (e.g. social distancing).  This format is felt to be most appropriate for this patient at this time.  All issues noted in this document were discussed and addressed.  No physical exam was performed (except for noted visual exam findings with Video Visits).   I connected with Daniel Calderon today at  1:45 PM EST by a video enabled telemedicine application or telephone and verified that I am speaking with the correct person using two identifiers. Location patient: [place] Location provider: work  Persons participating in the virtual visit: patient, provider  I discussed the limitations, risks, security and privacy concerns of performing an evaluation and management service by telephone and the availability of in person appointments. I also discussed with the patient that there may be a patient responsible charge related to this service. The patient expressed understanding and agreed to proceed.  Reason for visit: f/u.  HPI: Left-sided numbness: Patient was hospitalized for this in December.  Felt to be possibly related to cervical and lumbar radiculopathy less likely TIA.  He had symptoms in his face in V2 and V3 that would make radicular symptoms less likely.  He saw neurology and follow-up as an outpatient and they had nothing to offer at this time and recommended that he see a tertiary care center.  He notes the numbness is resolved though occasionally his left I will wink on its own.  He has had a similar episode previously.  Hypertension: Not checking blood pressures though is adequately controlled at neurology recently.  Taking amlodipine.  No chest pain, shortness of breath, or edema.  Hyperlipidemia: Taking Lipitor.  He notes they advised him to take 20 mg daily at the hospital.  LDL was 137.  Anxiety/depression: No anxiety or depression.   Continues on Zoloft.   ROS: See pertinent positives and negatives per HPI.  Past Medical History:  Diagnosis Date  . Allergy    Seasonal  . Anxiety   . Benign enlargement of prostate   . Depression   . Elevated BP   . Elevated transaminase level   . Failure of erection   . Fatigue   . Frequent headaches   . GERD (gastroesophageal reflux disease)   . History of kidney stones   . Hypertension   . Hypogonadism in male   . Migraine   . Sinus congestion 09/01/2017  . Sleep apnea    Currently uses cpap  . Thoracic outlet syndrome    Left Arm  . Tongue pain 02/09/2019    Past Surgical History:  Procedure Laterality Date  . COLONOSCOPY WITH PROPOFOL N/A 11/11/2017   Procedure: COLONOSCOPY WITH PROPOFOL;  Surgeon: Lin Landsman, MD;  Location: [place];  Service: Endoscopy;  Laterality: N/A;  . ESOPHAGOGASTRODUODENOSCOPY (EGD) WITH PROPOFOL N/A 08/25/2019   Procedure: ESOPHAGOGASTRODUODENOSCOPY (EGD) WITH PROPOFOL;  Surgeon: Lin Landsman, MD;  Location: [facility] ENDOSCOPY;  Service: Gastroenterology;  Laterality: N/A;  . KNEE ARTHROSCOPY WITH MEDIAL MENISECTOMY Left 07/26/2018   Procedure: KNEE ARTHROSCOPY WITH PARTIAL MEDIAL MENISECTOMY;  Surgeon: Leim Fabry, MD;  Location: [facility];  Service: Orthopedics;  Laterality: Left;  . Left Shoulder Surgery  2011  . nerve block     2 in neck. 5 in back.  . NOSE SURGERY    . POLYPECTOMY  11/11/2017   Procedure: POLYPECTOMY;  Surgeon: Lin Landsman, MD;  Location: [place];  Service: Endoscopy;;  . SCALENE NODE BIOPSY / EXCISION    . VASECTOMY      Family History  Problem Relation Age of Onset  . Hypertension Mother        Living  . Hearing loss Mother   . Heart disease Other   . Healthy Father        Living  . Diabetes Brother   . Stroke Paternal Uncle   . Heart attack Paternal Uncle   . Hearing loss Maternal Grandmother   . Healthy Son   . Healthy Daughter   . Kidney cancer Neg Hx   .  Bladder Cancer Neg Hx   . Prostate cancer Neg Hx     SOCIAL HX: Non-smoker   Current Outpatient Medications:  .  amLODipine (NORVASC) 10 MG tablet, Take 0.5 tablets (5 mg total) by mouth daily., Disp: 45 tablet, Rfl: 2 .  atorvastatin (LIPITOR) 40 MG tablet, Take 1 tablet (40 mg total) by mouth daily at 6 PM., Disp: 30 tablet, Rfl: 2 .  cyanocobalamin (,VITAMIN B-12,) 1000 MCG/ML injection, Inject 1,000 mcg into the muscle every 30 (thirty) days. , Disp: , Rfl:  .  Multiple Vitamin (MULTIVITAMIN) tablet, Take 1 tablet by mouth daily., Disp: , Rfl:  .  omeprazole (PRILOSEC) 20 MG capsule, Take 1 capsule (20 mg total) by mouth daily., Disp: 30 capsule, Rfl: 2 .  sertraline (ZOLOFT) 100 MG tablet, TAKE 1 TABLET BY MOUTH ONCE DAILY. (Patient taking differently: Take 100 mg by mouth daily. ), Disp: 90 tablet, Rfl: 1 .  testosterone (ANDROGEL) 50 MG/5GM (1%) GEL, Place 5 g onto the skin daily., Disp: 150 g, Rfl: 3  EXAM:  VITALS per patient if applicable:  GENERAL: alert, oriented, appears well and in no acute distress  HEENT: atraumatic, conjunttiva clear, no obvious abnormalities on inspection of external nose and ears  NECK: normal movements of the head and neck  LUNGS: on inspection no signs of respiratory distress, breathing rate appears normal, no obvious gross SOB, gasping or wheezing  CV: no obvious cyanosis  MS: moves all visible extremities without noticeable abnormality  PSYCH/NEURO: pleasant and cooperative, no obvious depression or anxiety, speech and thought processing grossly intact  ASSESSMENT AND PLAN:  Discussed the following assessment and plan:  Left sided numbness Symptoms have resolved at this time.  I discussed with the patient that overall his symptoms seem consistent with a stroke though one was not found on work-up.  Discussed that it would not seem to be related to a cervical lesion given that his face was involved.  I agree with Dr. Tomi Likens that referral to  a tertiary care center is appropriate at this time.  Referral will be placed.  Hyperlipidemia Plan to recheck lipids at next visit.  Continue Lipitor.  Hypertension Continue amlodipine.  Adjustment disorder with mixed anxiety and depressed mood Adequately controlled.  Continue Zoloft.   Orders Placed This Encounter  Procedures  . Ambulatory referral to Neurology    Referral Priority:   Routine    Referral Type:   Consultation    Referral Reason:   Second Opinion    Requested Specialty:   Neurology    Number of Visits Requested:   1    No orders of the defined types were placed in this encounter.    I discussed the assessment and treatment plan with the patient. The patient was provided an opportunity to ask questions and all were answered. The patient agreed with the plan and  demonstrated an understanding of the instructions.   The patient was advised to call back or seek an in-person evaluation if the symptoms worsen or if the condition fails to improve as anticipated.  Tommi Rumps, MD

## 2020-02-13 NOTE — Assessment & Plan Note (Signed)
Adequately controlled.  Continue Zoloft.

## 2020-02-13 NOTE — Assessment & Plan Note (Addendum)
Plan to recheck lipids at next visit.  Continue Lipitor.

## 2020-03-08 ENCOUNTER — Other Ambulatory Visit: Payer: Self-pay | Admitting: Neurology

## 2020-03-16 ENCOUNTER — Other Ambulatory Visit: Payer: Self-pay

## 2020-03-16 ENCOUNTER — Other Ambulatory Visit: Payer: No Typology Code available for payment source

## 2020-03-16 DIAGNOSIS — R7989 Other specified abnormal findings of blood chemistry: Secondary | ICD-10-CM

## 2020-03-17 LAB — TESTOSTERONE: Testosterone: 234 ng/dL — ABNORMAL LOW (ref 264–916)

## 2020-03-17 LAB — HEMATOCRIT: Hematocrit: 42.4 % (ref 37.5–51.0)

## 2020-03-19 ENCOUNTER — Telehealth: Payer: Self-pay | Admitting: *Deleted

## 2020-03-19 NOTE — Telephone Encounter (Signed)
-----   Message from Abbie Sons, MD sent at 03/17/2020 11:22 AM EDT ----- Testosterone level was low at 234.  If he is not symptomatic can continue present dose of AndroGel.  If having increased symptoms his dose can be adjusted.  Hematocrit was normal.  Keep scheduled follow-up

## 2020-03-26 NOTE — Telephone Encounter (Signed)
Notified patient of Dr Dene Gentry note below.  ----- M----- Message from Abbie Sons, MD sent at 03/17/2020 11:22 AM EDT ----- Testosterone level was low at 234.  If he is not symptomatic can continue present dose of AndroGel.  If having increased symptoms his dose can be adjusted.  Hematocrit was normal.  Keep scheduled follow-up

## 2020-03-29 ENCOUNTER — Other Ambulatory Visit: Payer: Self-pay | Admitting: Family Medicine

## 2020-04-25 ENCOUNTER — Other Ambulatory Visit: Payer: Self-pay

## 2020-04-25 ENCOUNTER — Emergency Department
Admission: EM | Admit: 2020-04-25 | Discharge: 2020-04-25 | Disposition: A | Discharge status: Home / Self Care | Payer: No Typology Code available for payment source | Attending: Emergency Medicine | Admitting: Emergency Medicine

## 2020-04-25 ENCOUNTER — Emergency Department: Payer: No Typology Code available for payment source

## 2020-04-25 DIAGNOSIS — R079 Chest pain, unspecified: Secondary | ICD-10-CM | POA: Insufficient documentation

## 2020-04-25 DIAGNOSIS — I10 Essential (primary) hypertension: Secondary | ICD-10-CM | POA: Insufficient documentation

## 2020-04-25 LAB — CBC
HCT: 44.3 % (ref 39.0–52.0)
Hemoglobin: 15.5 g/dL (ref 13.0–17.0)
MCH: 30 pg (ref 26.0–34.0)
MCHC: 35 g/dL (ref 30.0–36.0)
MCV: 85.7 fL (ref 80.0–100.0)
Platelets: 201 10*3/uL (ref 150–400)
RBC: 5.17 MIL/uL (ref 4.22–5.81)
RDW: 13.2 % (ref 11.5–15.5)
WBC: 8.6 10*3/uL (ref 4.0–10.5)
nRBC: 0 % (ref 0.0–0.2)

## 2020-04-25 LAB — BASIC METABOLIC PANEL
Anion gap: 11 (ref 5–15)
BUN: 15 mg/dL (ref 6–20)
CO2: 26 mmol/L (ref 22–32)
Calcium: 9.8 mg/dL (ref 8.9–10.3)
Chloride: 101 mmol/L (ref 98–111)
Creatinine, Ser: 1.07 mg/dL (ref 0.61–1.24)
GFR calc Af Amer: >60 mL/min (ref >60)
GFR calc non Af Amer: >60 mL/min (ref >60)
Glucose, Bld: 102 mg/dL — ABNORMAL HIGH (ref 70–99)
Potassium: 3.6 mmol/L (ref 3.5–5.1)
Sodium: 138 mmol/L (ref 135–145)

## 2020-04-25 LAB — TROPONIN I (HIGH SENSITIVITY)
Troponin I (High Sensitivity): 4 ng/L (ref <18)
Troponin I (High Sensitivity): 4 ng/L (ref <18)

## 2020-04-25 MED ORDER — MORPHINE SULFATE (PF) 4 MG/ML IV SOLN
4.0000 mg | Freq: Once | INTRAVENOUS | Status: AC
  Administered 2020-04-25: 4 mg via INTRAVENOUS
  Filled 2020-04-25: qty 1

## 2020-04-25 MED ORDER — ONDANSETRON HCL 4 MG/2ML IJ SOLN
4.0000 mg | Freq: Once | INTRAMUSCULAR | Status: AC
  Administered 2020-04-25: 4 mg via INTRAVENOUS
  Filled 2020-04-25: qty 2

## 2020-04-25 MED ORDER — LORAZEPAM 2 MG/ML IJ SOLN
1.0000 mg | Freq: Once | INTRAMUSCULAR | Status: AC
  Administered 2020-04-25: 1 mg via INTRAVENOUS
  Filled 2020-04-25: qty 1

## 2020-04-25 MED ORDER — IOHEXOL 350 MG/ML SOLN
100.0000 mL | Freq: Once | INTRAVENOUS | Status: AC | PRN
  Administered 2020-04-25: 100 mL via INTRAVENOUS

## 2020-04-25 NOTE — ED Notes (Signed)
Pt reporting 2/10 CP, denies N/V or SHOB.  Pt in NAD at this time

## 2020-04-25 NOTE — ED Provider Notes (Signed)
[facility] Emergency Department Provider Note       Time seen: ----------------------------------------- 8:14 AM on 04/25/2020 -----------------------------------------   I have reviewed the triage vital signs and the nursing notes.  HISTORY   Chief Complaint No chief complaint on file.    HPI Daniel Calderon is a 54 y.o. male with a history of anxiety, GERD, hypertension, thoracic outlet syndrome who presents to the ED for chest pain with diaphoresis and nausea.  Chest pain radiated into the left side of his jaw.  Patient reports having similar symptoms with a reported possible stroke recently.  He denies fevers, chills or other complaints.  Patient received nitroglycerin in route without any improvement in his symptoms.  Past Medical History:  Diagnosis Date  . Allergy    Seasonal  . Anxiety   . Benign enlargement of prostate   . Depression   . Elevated BP   . Elevated transaminase level   . Failure of erection   . Fatigue   . Frequent headaches   . GERD (gastroesophageal reflux disease)   . History of kidney stones   . Hypertension   . Hypogonadism in male   . Migraine   . Sinus congestion 09/01/2017  . Sleep apnea    Currently uses cpap  . Thoracic outlet syndrome    Left Arm  . Tongue pain 02/09/2019    Patient Active Problem List   Diagnosis Date Noted  . Left sided numbness 02/13/2020  . Hyperlipidemia 12/07/2019  . Radiculopathy, cervical and lumbar 12/06/2019  . Abdominal pain, epigastric   . Left flank pain 08/15/2019  . Elevated LFTs 08/10/2019  . Hypogammaglobulinemia ([place]) 05/17/2019  . Excessive daytime sleepiness 03/28/2019  . Cataplexy 03/28/2019  . OSA on CPAP 03/28/2019  . Acute pain of left knee 04/18/2018  . Cutaneous skin tags 04/18/2018  . Lower extremity edema 04/18/2018  . Thoracic outlet syndrome 02/19/2018  . Pain in limb 02/19/2018  . Muscle strain 09/01/2017  . Chest pain 03/21/2017  . Dyspnea on  exertion 02/19/2017  . Language difficulty 02/19/2017  . BPH with obstruction/lower urinary tract symptoms 12/03/2015  . Medication refill 09/13/2015  . Erectile disorder, generalized, mild 07/26/2015  . Hypogonadism in male 05/24/2015  . Environmental allergies 04/13/2015  . Sleep apnea 04/13/2015  . Migraines 04/13/2015  . Neck pain 04/13/2015  . Adjustment disorder with mixed anxiety and depressed mood 04/13/2015  . Psoriasis 04/13/2015  . History of kidney stones 04/13/2015  . Neuropathy 04/13/2015  . Benign fibroma of prostate 03/24/2015  . Hypertension 03/24/2015  . Elevation of level of transaminase or lactic acid dehydrogenase (LDH) 03/24/2015  . Fatigue 03/24/2015    Past Surgical History:  Procedure Laterality Date  . COLONOSCOPY WITH PROPOFOL N/A 11/11/2017   Procedure: COLONOSCOPY WITH PROPOFOL;  Surgeon: Lin Landsman, MD;  Location: [place];  Service: Endoscopy;  Laterality: N/A;  . ESOPHAGOGASTRODUODENOSCOPY (EGD) WITH PROPOFOL N/A 08/25/2019   Procedure: ESOPHAGOGASTRODUODENOSCOPY (EGD) WITH PROPOFOL;  Surgeon: Lin Landsman, MD;  Location: [facility];  Service: Gastroenterology;  Laterality: N/A;  . KNEE ARTHROSCOPY WITH MEDIAL MENISECTOMY Left 07/26/2018   Procedure: KNEE ARTHROSCOPY WITH PARTIAL MEDIAL MENISECTOMY;  Surgeon: Leim Fabry, MD;  Location: [facility];  Service: Orthopedics;  Laterality: Left;  . Left Shoulder Surgery  2011  . nerve block     2 in neck. 5 in back.  . NOSE SURGERY    . POLYPECTOMY  11/11/2017   Procedure: POLYPECTOMY;  Surgeon: Marius Ditch,  Tally Due, MD;  Location: [place];  Service: Endoscopy;;  . SCALENE NODE BIOPSY / EXCISION    . VASECTOMY      Allergies Erythromycin, Cephalexin, Dexamethasone, Oxycodone-acetaminophen, Prednisone, Gabapentin, and Ibuprofen  Social History Social History   Tobacco Use  . Smoking status: Never Smoker  . Smokeless tobacco: Never Used  Substance Use Topics   . Alcohol use: Yes    Alcohol/week: 3.0 standard drinks    Types: 3 Cans of beer per week  . Drug use: No    Review of Systems Constitutional: Negative for fever. Cardiovascular: Positive for chest pain Respiratory: Negative for shortness of breath. Gastrointestinal: Negative for abdominal pain, positive for nausea Musculoskeletal: Negative for back pain. Skin: Negative for rash.  Positive for diaphoresis Neurological: Negative for headaches, focal weakness or numbness.  All systems negative/normal/unremarkable except as stated in the HPI  ____________________________________________   PHYSICAL EXAM:  VITAL SIGNS: ED Triage Vitals  Enc Vitals Group     BP      Pulse      Resp      Temp      Temp src      SpO2      Weight      Height      Head Circumference      Peak Flow      Pain Score      Pain Loc      Pain Edu?      Excl. in [place]?     Constitutional: Alert and oriented.  Anxious, mild to moderate distress Eyes: Conjunctivae are normal. Normal extraocular movements. ENT      Head: Normocephalic and atraumatic.      Nose: No congestion/rhinnorhea.      Mouth/Throat: Mucous membranes are moist.      Neck: No stridor. Cardiovascular: Normal rate, regular rhythm. No murmurs, rubs, or gallops. Respiratory: Tachypnea with clear breath sounds Gastrointestinal: Soft and nontender. Normal bowel sounds Musculoskeletal: Nontender with normal range of motion in extremities. No lower extremity tenderness nor edema. Neurologic:  Normal speech and language. No gross focal neurologic deficits are appreciated.  Skin:  Skin is warm with diaphoresis Psychiatric: Anxious mood and affect ____________________________________________  EKG: Interpreted by me.  Sinus rhythm with rate of 77 bpm, normal PR interval, normal QRS, normal QT  Repeat EKG interpreted by me, sinus rhythm with a rate of 85 bpm, normal PR interval, normal QRS, normal  QT ____________________________________________  ED COURSE:  As part of my medical decision making, I reviewed the following data within the [place] History obtained from family if available, nursing notes, old chart and ekg, as well as notes from prior ED visits. Patient presented for chest pain, we will assess with labs and imaging as indicated at this time. Clinical Course as of Apr 25 1100  Wed Apr 25, 2020  1101 Patient resting comfortably in bed, no acute distress   [JW]    Clinical Course User Index [JW] Earleen Newport, MD   Procedures  Daniel Calderon was evaluated in Emergency Department on 04/25/2020 for the symptoms described in the history of present illness. He was evaluated in the context of the global COVID-19 pandemic, which necessitated consideration that the patient might be at risk for infection with the SARS-CoV-2 virus that causes COVID-19. Institutional protocols and algorithms that pertain to the evaluation of patients at risk for COVID-19 are in a state of rapid change based on information released by regulatory  bodies including the CDC and federal and state organizations. These policies and algorithms were followed during the patient's care in the ED.  ____________________________________________   LABS (pertinent positives/negatives)  Labs Reviewed  BASIC METABOLIC PANEL - Abnormal; Notable for the following components:      Result Value   Glucose, Bld 102 (*)    All other components within normal limits  CBC  TROPONIN I (HIGH SENSITIVITY)  TROPONIN I (HIGH SENSITIVITY)    RADIOLOGY Images were viewed by me  Chest x-ray IMPRESSION: 1. No evidence of aortic dissection or aortic atherosclerosis. 2. Hepatic steatosis. 3. Enlarged pulmonic trunk, indicative of pulmonary arterial hypertension. 4. L5-S1 degenerative disc disease. ____________________________________________   DIFFERENTIAL DIAGNOSIS   Musculoskeletal pain,  GERD, anxiety, panic attack, MI, dissection  FINAL ASSESSMENT AND PLAN  Chest pain   Plan: The patient had presented for chest pain with diaphoresis and nausea. Patient's labs were reassuring. Patient's imaging did not reveal any acute process.  No clear etiology for his constellation of symptoms, likely multifactorial.  He will be referred to cardiology for close outpatient follow-up.   Laurence Aly, MD    Note: This note was generated in part or whole with voice recognition software. Voice recognition is usually quite accurate but there are transcription errors that can and very often do occur. I apologize for any typographical errors that were not detected and corrected.     Earleen Newport, MD 04/25/20 1102

## 2020-04-25 NOTE — ED Triage Notes (Signed)
Patient arrived via EMS from driving to work. Patient is AOx4 and ambulatory. Patient began to have sudden chest pain and left arm pain. Patient is diaphoretic and nausea's. No emesis.  0.08mg  of nitro given sublingual 324 Asprin  4mg  zofran given IV

## 2020-05-07 ENCOUNTER — Other Ambulatory Visit: Payer: Self-pay | Admitting: Family Medicine

## 2020-05-15 ENCOUNTER — Ambulatory Visit: Payer: No Typology Code available for payment source | Admitting: Family Medicine

## 2020-05-26 ENCOUNTER — Telehealth (HOSPITAL_COMMUNITY): Payer: Self-pay | Admitting: Psychiatry

## 2020-06-06 ENCOUNTER — Other Ambulatory Visit: Payer: Self-pay

## 2020-06-06 MED ORDER — OMEPRAZOLE 20 MG PO CPDR: 20.0000 mg | DELAYED_RELEASE_CAPSULE | Freq: Every day | ORAL | 2 refills | Status: DC

## 2020-06-19 ENCOUNTER — Telehealth (HOSPITAL_COMMUNITY): Payer: Self-pay | Admitting: Psychiatry

## 2020-06-19 ENCOUNTER — Telehealth: Payer: Self-pay | Admitting: Internal Medicine

## 2020-06-19 NOTE — Telephone Encounter (Signed)
Patient has been contacted at least 3 times for a recall, recall has been deleted

## 2020-06-29 ENCOUNTER — Other Ambulatory Visit: Payer: Self-pay | Admitting: Family Medicine

## 2020-07-31 ENCOUNTER — Ambulatory Visit
Admission: RE | Admit: 2020-07-31 | Discharge: 2020-07-31 | Disposition: A | Discharge status: Home / Self Care | Payer: No Typology Code available for payment source | Source: Ambulatory Visit | Attending: Emergency Medicine | Admitting: Emergency Medicine

## 2020-07-31 ENCOUNTER — Other Ambulatory Visit: Payer: Self-pay

## 2020-07-31 VITALS — BP 141/108 | HR 91 | Temp 98.2°F | Resp 18 | Ht 74.0 in | Wt 210.0 lb

## 2020-07-31 DIAGNOSIS — Z20822 Contact with and (suspected) exposure to covid-19: Secondary | ICD-10-CM

## 2020-07-31 DIAGNOSIS — J069 Acute upper respiratory infection, unspecified: Secondary | ICD-10-CM | POA: Diagnosis not present

## 2020-07-31 LAB — SARS CORONAVIRUS 2 (TAT 6-24 HRS): SARS Coronavirus 2: NEGATIVE

## 2020-07-31 MED ORDER — HYDROCOD POLST-CPM POLST ER 10-8 MG/5ML PO SUER: 5.0000 mL | Freq: Two times a day (BID) | ORAL | 0 refills | Status: DC | PRN

## 2020-07-31 MED ORDER — IBUPROFEN 600 MG PO TABS
600.0000 mg | ORAL_TABLET | Freq: Four times a day (QID) | ORAL | 0 refills | Status: DC | PRN
Start: 2020-07-31 — End: 2022-10-06

## 2020-07-31 MED ORDER — BENZONATATE 200 MG PO CAPS
200.0000 mg | ORAL_CAPSULE | Freq: Three times a day (TID) | ORAL | 0 refills | Status: DC | PRN
Start: 2020-07-31 — End: 2020-11-13

## 2020-07-31 NOTE — ED Provider Notes (Signed)
HPI  SUBJECTIVE:  Daniel Calderon is a 54 y.o. male who presents with frontal headache, nasal congestion, bilateral ear pain and severe "head stuffiness" starting last night.  Reports body aches, sinus pain as pressure, loss of smell, cough.  He had itchy, watery eyes and sneezing at first, thought this was allergies, but it did not resolve with Flonase which it usually does.  No change in hearing, otorrhea.  No fevers, facial swelling, upper dental pain.  No loss of taste, wheezing, chest pain, shortness of breath.  No nausea, vomiting, diarrhea, abdominal pain.  No known Covid exposure.  Received both doses of the Pfizer vaccine in January.  He denies new or different photophobia, neck stiffness, rash.  No antibiotics in the past month.  He took Tylenol within 6 hours of evaluation.  He states that he is coughing all night long.  He works at [employer].  He has tried Flonase, does milligrams of Tylenol.  Symptoms worse with movement.  He has a past medical history of allergies, hypertension, pneumonia.  No history of diabetes, coronary artery disease, HIV, immunocompromise, cancer, frequent sinusitis, pulmonary disease, smoking.  EGB:[id number], Angela Adam, MD   Past Medical History:  Diagnosis Date  . Allergy    Seasonal  . Anxiety   . Benign enlargement of prostate   . Depression   . Elevated BP   . Elevated transaminase level   . Failure of erection   . Fatigue   . Frequent headaches   . GERD (gastroesophageal reflux disease)   . History of kidney stones   . Hypertension   . Hypogonadism in male   . Migraine   . Sinus congestion 09/01/2017  . Sleep apnea    Currently uses cpap  . Thoracic outlet syndrome    Left Arm  . Tongue pain 02/09/2019    Past Surgical History:  Procedure Laterality Date  . COLONOSCOPY WITH PROPOFOL N/A 11/11/2017   Procedure: COLONOSCOPY WITH PROPOFOL;  Surgeon: Lin Landsman, MD;  Location: [place];  Service: Endoscopy;  Laterality: N/A;  .  ESOPHAGOGASTRODUODENOSCOPY (EGD) WITH PROPOFOL N/A 08/25/2019   Procedure: ESOPHAGOGASTRODUODENOSCOPY (EGD) WITH PROPOFOL;  Surgeon: Lin Landsman, MD;  Location: [facility] ENDOSCOPY;  Service: Gastroenterology;  Laterality: N/A;  . KNEE ARTHROSCOPY WITH MEDIAL MENISECTOMY Left 07/26/2018   Procedure: KNEE ARTHROSCOPY WITH PARTIAL MEDIAL MENISECTOMY;  Surgeon: Leim Fabry, MD;  Location: [facility];  Service: Orthopedics;  Laterality: Left;  . Left Shoulder Surgery  2011  . nerve block     2 in neck. 5 in back.  . NOSE SURGERY    . POLYPECTOMY  11/11/2017   Procedure: POLYPECTOMY;  Surgeon: Lin Landsman, MD;  Location: [place];  Service: Endoscopy;;  . SCALENE NODE BIOPSY / EXCISION    . VASECTOMY      Family History  Problem Relation Age of Onset  . Hypertension Mother        Living  . Hearing loss Mother   . Heart disease Other   . Healthy Father        Living  . Diabetes Brother   . Stroke Paternal Uncle   . Heart attack Paternal Uncle   . Hearing loss Maternal Grandmother   . Healthy Son   . Healthy Daughter   . Kidney cancer Neg Hx   . Bladder Cancer Neg Hx   . Prostate cancer Neg Hx     Social History   Tobacco Use  . Smoking status: Never  Smoker  . Smokeless tobacco: Never Used  Vaping Use  . Vaping Use: Never used  Substance Use Topics  . Alcohol use: Yes    Alcohol/week: 3.0 standard drinks    Types: 3 Cans of beer per week  . Drug use: No    No current facility-administered medications for this encounter.  Current Outpatient Medications:  .  amLODipine (NORVASC) 10 MG tablet, TAKE 1 TABLET BY MOUTH DAILY., Disp: 90 tablet, Rfl: 2 .  atorvastatin (LIPITOR) 40 MG tablet, Take 1 tablet (40 mg total) by mouth daily at 6 PM., Disp: 30 tablet, Rfl: 2 .  clonazePAM (KLONOPIN) 0.5 MG tablet, Take 0.5 mg by mouth 2 (two) times daily as needed., Disp: , Rfl:  .  cyanocobalamin (,VITAMIN B-12,) 1000 MCG/ML injection, Inject 1,000 mcg into the muscle  every 30 (thirty) days. , Disp: , Rfl:  .  diclofenac (VOLTAREN) 75 MG EC tablet, Take 75 mg by mouth 2 (two) times daily., Disp: , Rfl:  .  divalproex (DEPAKOTE) 250 MG DR tablet, Take 250 mg by mouth 2 (two) times daily., Disp: , Rfl:  .  nortriptyline (PAMELOR) 10 MG capsule, Take 10 mg by mouth at bedtime., Disp: , Rfl:  .  omeprazole (PRILOSEC) 20 MG capsule, Take 1 capsule (20 mg total) by mouth daily., Disp: 30 capsule, Rfl: 2 .  sertraline (ZOLOFT) 100 MG tablet, TAKE 1 TABLET BY MOUTH ONCE DAILY., Disp: 90 tablet, Rfl: 1 .  testosterone (ANDROGEL) 50 MG/5GM (1%) GEL, Place 5 g onto the skin daily., Disp: 150 g, Rfl: 3 .  benzonatate (TESSALON) 200 MG capsule, Take 1 capsule (200 mg total) by mouth 3 (three) times daily as needed for cough., Disp: 30 capsule, Rfl: 0 .  chlorpheniramine-HYDROcodone (TUSSIONEX PENNKINETIC ER) 10-8 MG/5ML SUER, Take 5 mLs by mouth every 12 (twelve) hours as needed for cough., Disp: 60 mL, Rfl: 0 .  ibuprofen (ADVIL) 600 MG tablet, Take 1 tablet (600 mg total) by mouth every 6 (six) hours as needed., Disp: 30 tablet, Rfl: 0 .  Multiple Vitamin (MULTIVITAMIN) tablet, Take 1 tablet by mouth daily., Disp: , Rfl:   Allergies  Allergen Reactions  . Erythromycin Nausea And Vomiting  . Cephalexin Other (See Comments)    Unknown  . Dexamethasone Other (See Comments)    Unknown  . Oxycodone-Acetaminophen Other (See Comments)    Unknown  . Prednisone Other (See Comments)    Unknown  . Gabapentin Other (See Comments)    Aggressive  . Ibuprofen Itching and Other (See Comments)    Can take in low doses per pt     ROS  As noted in HPI.   Physical Exam  BP (!) 141/108 (BP Location: Right Arm)   Pulse 91   Temp 98.2 F (36.8 C) (Oral)   Resp 18   Ht 6\' 2"  (1.88 m)   Wt 95.3 kg   SpO2 99%   BMI 26.96 kg/m   Constitutional: Well developed, well nourished, no acute distress Eyes:  EOMI, conjunctiva normal bilaterally HENT: Normocephalic,  atraumatic,mucus membranes moist positive nasal congestion.  Swollen red turbinates.  No maxillary, frontal sinus tenderness.  Erythematous oropharynx.  Tonsils normal without exudates.  No postnasal drip. Neck: Positive cervical lymphadenopathy Respiratory: Normal inspiratory effort lungs clear bilaterally Cardiovascular: Normal rate regular rhythm no murmurs rubs or gallops GI: nondistended skin: No rash, skin intact Musculoskeletal: no deformities Neurologic: Alert & oriented x 3, no focal neuro deficits Psychiatric: Speech and behavior appropriate  ED Course   Medications - No data to display  Orders Placed This Encounter  Procedures  . SARS CORONAVIRUS 2 (TAT 6-24 HRS) Nasopharyngeal Nasopharyngeal Swab    Standing Status:   Standing    Number of Occurrences:   1    Order Specific Question:   Is this test for diagnosis or screening    Answer:   Screening    Order Specific Question:   Symptomatic for COVID-19 as defined by CDC    Answer:   No    Order Specific Question:   Hospitalized for COVID-19    Answer:   No    Order Specific Question:   Admitted to ICU for COVID-19    Answer:   No    Order Specific Question:   Previously tested for COVID-19    Answer:   Yes    Order Specific Question:   Resident in a congregate (group) care setting    Answer:   No    Order Specific Question:   Employed in healthcare setting    Answer:   Yes    Order Specific Question:   Has patient completed COVID vaccination(s) (2 doses of Pfizer/Moderna 1 dose of [employer])    Answer:   Yes    Results for orders placed or performed during the hospital encounter of 07/31/20 (from the past 24 hour(s))  SARS CORONAVIRUS 2 (TAT 6-24 HRS) Nasopharyngeal Nasopharyngeal Swab     Status: None   Collection Time: 07/31/20 12:21 PM   Specimen: Nasopharyngeal Swab  Result Value Ref Range   SARS Coronavirus 2 NEGATIVE NEGATIVE   No results found.  ED Clinical Impression  1. Upper respiratory  tract infection, unspecified type   2. Encounter for laboratory testing for COVID-19 virus      ED Assessment/Plan  [place] Narcotic database reviewed for this patient, and feel that the risk/benefit ratio today is favorable for proceeding with a prescription for controlled substance. No opiate prescriptions in 2 years.  Covid sent  Patient with a viral illness, suspect Covid. Ibuprofen/Tylenol, Tessalon, Tussionex, saline nasal irrigation, Flonase, Mucinex D as long as he keeps an eye on his blood pressure. He is to switch to regular Mucinex if his blood pressure remains elevated. Pulse oximeter.  Covid negative.  Plan as above  Discussed labs, MDM, treatment plan, and plan for follow-up with patient. Discussed sn/sx that should prompt return to the ED. patient agrees with plan.   Meds ordered this encounter  Medications  . chlorpheniramine-HYDROcodone (TUSSIONEX PENNKINETIC ER) 10-8 MG/5ML SUER    Sig: Take 5 mLs by mouth every 12 (twelve) hours as needed for cough.    Dispense:  60 mL    Refill:  0  . benzonatate (TESSALON) 200 MG capsule    Sig: Take 1 capsule (200 mg total) by mouth 3 (three) times daily as needed for cough.    Dispense:  30 capsule    Refill:  0  . ibuprofen (ADVIL) 600 MG tablet    Sig: Take 1 tablet (600 mg total) by mouth every 6 (six) hours as needed.    Dispense:  30 tablet    Refill:  0    *This clinic note was created using [employer]. Therefore, there may be occasional mistakes despite careful proofreading.   ?   Melynda Ripple, MD 08/01/20 [phone]

## 2020-07-31 NOTE — ED Triage Notes (Signed)
Patient in today w/ c/o cough, sinus congestion and drainage, chills, sneezing. Sx onset yesterday. Patient states he has received both doses of Pfizer vaccine. Patient denies loss of taste. Smell is compromised by sinus congestion.

## 2020-07-31 NOTE — Discharge Instructions (Addendum)
Mucinex DM as long as you keep an eye on your blood pressure. If it remains consistently elevated above 140/90, stop the Mucinex D and start regular Mucinex. Ibuprofen/Tylenol together 3-4 times a day. Tessalon for the cough during the day, Tussionex for the cough at night. The Tussionex has hydrocodone in it. Start Flonase, saline nasal irrigation with a Milta Deiters med rinse and distilled water as often as you want. Buy a pulse oximeter. Go immediately to the ER if your pulse ox reads consistently 90% and below

## 2020-08-21 ENCOUNTER — Ambulatory Visit: Payer: No Typology Code available for payment source | Attending: Neurology | Admitting: Physical Therapy

## 2020-08-21 ENCOUNTER — Encounter: Payer: Self-pay | Admitting: Physical Therapy

## 2020-08-21 ENCOUNTER — Other Ambulatory Visit: Payer: Self-pay

## 2020-08-21 DIAGNOSIS — M6281 Muscle weakness (generalized): Secondary | ICD-10-CM | POA: Diagnosis present

## 2020-08-21 DIAGNOSIS — M542 Cervicalgia: Secondary | ICD-10-CM | POA: Insufficient documentation

## 2020-08-21 NOTE — Therapy (Signed)
[place] MAIN [facility] SERVICES [address] [place], [place], [address]   Phone: [phone]   Fax:  [phone]  Physical Therapy Evaluation  Patient Details  Name: Daniel Calderon MRN: [id number] Date of Birth: 1966/09/25 Referring Provider (PT): Gurney Maxin   Encounter Date: 08/21/2020   PT End of Session - 08/21/20 1330    Visit Number 1    Number of Visits 17    Date for PT Re-Evaluation 10/16/20    PT Start Time 0105    PT Stop Time 0200    PT Time Calculation (min) 55 min    Equipment Utilized During Treatment --    Activity Tolerance Patient tolerated treatment well;No increased pain    Behavior During Therapy WFL for tasks assessed/performed           Past Medical History:  Diagnosis Date  . Allergy    Seasonal  . Anxiety   . Benign enlargement of prostate   . Depression   . Elevated BP   . Elevated transaminase level   . Failure of erection   . Fatigue   . Frequent headaches   . GERD (gastroesophageal reflux disease)   . History of kidney stones   . Hypertension   . Hypogonadism in male   . Migraine   . Sinus congestion 09/01/2017  . Sleep apnea    Currently uses cpap  . Thoracic outlet syndrome    Left Arm  . Tongue pain 02/09/2019    Past Surgical History:  Procedure Laterality Date  . COLONOSCOPY WITH PROPOFOL N/A 11/11/2017   Procedure: COLONOSCOPY WITH PROPOFOL;  Surgeon: Lin Landsman, MD;  Location: [place];  Service: Endoscopy;  Laterality: N/A;  . ESOPHAGOGASTRODUODENOSCOPY (EGD) WITH PROPOFOL N/A 08/25/2019   Procedure: ESOPHAGOGASTRODUODENOSCOPY (EGD) WITH PROPOFOL;  Surgeon: Lin Landsman, MD;  Location: [facility] ENDOSCOPY;  Service: Gastroenterology;  Laterality: N/A;  . KNEE ARTHROSCOPY WITH MEDIAL MENISECTOMY Left 07/26/2018   Procedure: KNEE ARTHROSCOPY WITH PARTIAL MEDIAL MENISECTOMY;  Surgeon: Leim Fabry, MD;  Location: [facility] ORS;  Service: Orthopedics;  Laterality:  Left;  . Left Shoulder Surgery  2011  . nerve block     2 in neck. 5 in back.  . NOSE SURGERY    . POLYPECTOMY  11/11/2017   Procedure: POLYPECTOMY;  Surgeon: Lin Landsman, MD;  Location: [place];  Service: Endoscopy;;  . SCALENE NODE BIOPSY / EXCISION    . VASECTOMY      There were no vitals filed for this visit.    Subjective Assessment - 08/21/20 1314    Subjective Patient had occipital nerve block 08/13/20.He is now able to turn his head. Patient has less pain with head rotation. He is having constant pain 1/10.    Pertinent History Patient states he is having constant left sided neck pain. Describes pain as 1/10.Continues to have left sided numbness. Cervical spine spondylosis most notable at C6-7 with a right lateralrecess disc protrusion with an annular fissure which contacts andimpinges the right C7 nerve root. There is also moderate to severebilateral neural foraminal narrowing and mild central canalStenosis.    Currently in Pain? Yes    Pain Score 1     Pain Descriptors / Indicators Aching    Pain Type Chronic pain    Pain Onset More than a month ago    Pain Frequency Constant    Aggravating Factors  turning his head    Pain Relieving Factors NA  Effect of Pain on Daily Activities cant read as much, cant lift,    Multiple Pain Sites No              [facility] PT Assessment - 08/21/20 1323      Assessment   Medical Diagnosis bilateral neural foraminal narrowing and mild central canal    Referring Provider (PT) Gurney Maxin    Onset Date/Surgical Date 08/13/20    Prior Therapy out patient      Precautions   Precautions None      Restrictions   Weight Bearing Restrictions No      Balance Screen   Has the patient fallen in the past 6 months No    Has the patient had a decrease in activity level because of a fear of falling?  Yes    Is the patient reluctant to leave their home because of a fear of falling?  No      Home Manufacturing systems engineer residence    Living Arrangements Spouse/significant other    Available Help at Discharge Family    Type of [place] to enter    Entrance Stairs-Number of Steps 4    Entrance Stairs-Rails Right    [place] One level    [place] None      Prior Function   Level of Independence Independent;Independent with basic ADLs;Independent with household mobility without device    Vocation Full time employment    Vocation Requirements climb ladders, walking, bending,     Leisure golf      Cognition   Overall Cognitive Status Within Functional Limits for tasks assessed             PAIN: 1/0 constant neck   POSTURE:WFL  Cervical AROM Rotation  right 60 deg Rotation  right 70 deg SBL40 deg SBL25 deg Flex 40 deg Ext 25 deg   PROM/AROM: PROM BUE: WNL AROM BUE WNL PROM BLE WNL AROM BLE: WNL STRENGTH:  Graded on a 0-5 scale Muscle Group Left Right  Shoulder flex 5/5 5/5  Shoulder Abd 5/5 5/5  Shoulder Ext 5/5 5/5  Shoulder IR/ER 5/5 5/5  Elbow 5/5 5/5  Wrist/hand 5/5 5/5  grip 56 lbs 44 lbs                                   SOMATOSENSORY:  Any N & T in extremities or weakness: reports :         Sensation           Intact      Diminished         Absent  Light touch UEs      FUNCTIONAL MOBILITY: WNL   BALANCE: WNL   GAIT: WNL  OUTCOME MEASURES: TEST Outcome Interpretation  NDI 42% 30-48% moderate disabiity                         Treatment: Prone scapula retraction x 10 Prone UE extension x 10  Seated scapula depression x 10  Supine chin tuck- sub max due to discomfort x 10 reps        Objective measurements completed on examination: See above findings.               PT Education - 08/21/20 1311    Education Details Plan of care  Person(s) Educated Patient    Methods Explanation    Comprehension Verbalized understanding            PT Short Term Goals - 08/21/20 1425       PT SHORT TERM GOAL #1   Title Patient will improve FOTO score to indicate improved functional mobility    Time 4    Period Weeks    Status New    Target Date 09/18/20             PT Long Term Goals - 08/21/20 1426      PT LONG TERM GOAL #1   Title Patient will be independent in home exercise program to improve strength/mobility for better functional independence with ADLs.    Time 8    Period Weeks    Status New    Target Date 10/16/20      PT LONG TERM GOAL #2   Title Patient will decreased NDI score 8points to demonstrate better functional mobility    Time 8    Period Weeks    Status New    Target Date 10/16/20      PT LONG TERM GOAL #3   Title Patient will be able to perform household work/ chores without increase in symptoms.    Time 8    Period Weeks    Status New    Target Date 10/16/20      PT LONG TERM GOAL #4   Title Patient will report a worst pain of 0/10 on VAS in neck  to improve tolerance with ADLs and reduced symptoms with activities.    Time 8    Period Weeks    Status New    Target Date 10/16/20                  Plan - 08/21/20 1331    Clinical Impression Statement Patient is 54 year old male who presents to PT evaluation after 11 years of neck pain; now s/p  occipital nerve block 08/13/20. He has 5/5 UE strength and minimal  ROM deficits in neck. He has decreased strength in thoracic spine musculature and inabiity to hold postural exercises without weakness and increased neck pain. Patient will benefit from skilled PT to improve postural strength, thoracic strength and stretching of upper traps and chest musculature for improving overall ADL tolerance.   Stability/Clinical Decision Making Stable/Uncomplicated    Clinical Decision Making Low    Rehab Potential Fair    PT Frequency 2x / week    PT Duration 8 weeks    PT Treatment/Interventions Moist Heat;Electrical Stimulation;Patient/family education;Therapeutic exercise;Therapeutic  activities    PT Next Visit Plan posture training    PT Home Exercise Plan posture training    Consulted and Agree with Plan of Care Patient           Patient will benefit from skilled therapeutic intervention in order to improve the following deficits and impairments:  Pain, Decreased activity tolerance, Decreased strength, Postural dysfunction, Decreased range of motion, Increased muscle spasms, Impaired flexibility  Visit Diagnosis: Neck pain  Muscle weakness (generalized)     Problem List Patient Active Problem List   Diagnosis Date Noted  . Left sided numbness 02/13/2020  . Hyperlipidemia 12/07/2019  . Radiculopathy, cervical and lumbar 12/06/2019  . Abdominal pain, epigastric   . Left flank pain 08/15/2019  . Elevated LFTs 08/10/2019  . Hypogammaglobulinemia ([place]) 05/17/2019  . Excessive daytime sleepiness 03/28/2019  . Cataplexy 03/28/2019  . OSA on  CPAP 03/28/2019  . Acute pain of left knee 04/18/2018  . Cutaneous skin tags 04/18/2018  . Lower extremity edema 04/18/2018  . Thoracic outlet syndrome 02/19/2018  . Pain in limb 02/19/2018  . Muscle strain 09/01/2017  . Chest pain 03/21/2017  . Dyspnea on exertion 02/19/2017  . Language difficulty 02/19/2017  . BPH with obstruction/lower urinary tract symptoms 12/03/2015  . Medication refill 09/13/2015  . Erectile disorder, generalized, mild 07/26/2015  . Hypogonadism in male 05/24/2015  . Environmental allergies 04/13/2015  . Sleep apnea 04/13/2015  . Migraines 04/13/2015  . Neck pain 04/13/2015  . Adjustment disorder with mixed anxiety and depressed mood 04/13/2015  . Psoriasis 04/13/2015  . History of kidney stones 04/13/2015  . Neuropathy 04/13/2015  . Benign fibroma of prostate 03/24/2015  . Hypertension 03/24/2015  . Elevation of level of transaminase or lactic acid dehydrogenase (LDH) 03/24/2015  . Fatigue 03/24/2015    Alanson Puls, PT DPT 08/21/2020, 2:35 PM  [place] MAIN [facility] SERVICES [address] [place], [place], [address] Phone: [phone]   Fax:  [phone]  Name: KAYLUB DETIENNE MRN: [id number] Date of Birth: 24-Nov-1966

## 2020-08-23 ENCOUNTER — Other Ambulatory Visit: Payer: Self-pay

## 2020-08-23 ENCOUNTER — Ambulatory Visit: Payer: No Typology Code available for payment source | Attending: Neurology | Admitting: Physical Therapy

## 2020-08-23 ENCOUNTER — Encounter: Payer: Self-pay | Admitting: Physical Therapy

## 2020-08-23 DIAGNOSIS — M6281 Muscle weakness (generalized): Secondary | ICD-10-CM | POA: Insufficient documentation

## 2020-08-23 DIAGNOSIS — M542 Cervicalgia: Secondary | ICD-10-CM | POA: Diagnosis not present

## 2020-08-23 NOTE — Therapy (Signed)
[place] MAIN [facility] SERVICES [address] [place], [place], [address] Phone: [phone]   Fax:  [phone]  Physical Therapy Treatment  Patient Details  Name: Daniel Calderon MRN: [id number] Date of Birth: 08-28-1966 Referring Provider (PT): Gurney Maxin   Encounter Date: 08/23/2020   PT End of Session - 08/23/20 1356    Visit Number 2    Number of Visits 17    Date for PT Re-Evaluation 10/16/20    PT Start Time 0145    PT Stop Time 0225    PT Time Calculation (min) 40 min    Activity Tolerance Patient tolerated treatment well;No increased pain    Behavior During Therapy WFL for tasks assessed/performed           Past Medical History:  Diagnosis Date  . Allergy    Seasonal  . Anxiety   . Benign enlargement of prostate   . Depression   . Elevated BP   . Elevated transaminase level   . Failure of erection   . Fatigue   . Frequent headaches   . GERD (gastroesophageal reflux disease)   . History of kidney stones   . Hypertension   . Hypogonadism in male   . Migraine   . Sinus congestion 09/01/2017  . Sleep apnea    Currently uses cpap  . Thoracic outlet syndrome    Left Arm  . Tongue pain 02/09/2019    Past Surgical History:  Procedure Laterality Date  . COLONOSCOPY WITH PROPOFOL N/A 11/11/2017   Procedure: COLONOSCOPY WITH PROPOFOL;  Surgeon: Lin Landsman, MD;  Location: [place];  Service: Endoscopy;  Laterality: N/A;  . ESOPHAGOGASTRODUODENOSCOPY (EGD) WITH PROPOFOL N/A 08/25/2019   Procedure: ESOPHAGOGASTRODUODENOSCOPY (EGD) WITH PROPOFOL;  Surgeon: Lin Landsman, MD;  Location: [facility]COPY;  Service: Gastroenterology;  Laterality: N/A;  . KNEE ARTHROSCOPY WITH MEDIAL MENISECTOMY Left 07/26/2018   Procedure: KNEE ARTHROSCOPY WITH PARTIAL MEDIAL MENISECTOMY;  Surgeon: Leim Fabry, MD;  Location: [facility];  Service: Orthopedics;  Laterality: Left;  . Left Shoulder Surgery  2011  . nerve  block     2 in neck. 5 in back.  . NOSE SURGERY    . POLYPECTOMY  11/11/2017   Procedure: POLYPECTOMY;  Surgeon: Lin Landsman, MD;  Location: [place];  Service: Endoscopy;;  . SCALENE NODE BIOPSY / EXCISION    . VASECTOMY      There were no vitals filed for this visit.   Subjective Assessment - 08/23/20 1351    Subjective Patient reports that his neck began hurting again this AM. He is having 4/10 neck pain, L scapula and L eye,  and head stinging.    Pertinent History Patient states he is having constant left sided neck pain. Describes pain as 1/10.Continues to have left sided numbness. Cervical spine spondylosis most notable at C6-7 with a right lateralrecess disc protrusion with an annular fissure which contacts andimpinges the right C7 nerve root. There is also moderate to severebilateral neural foraminal narrowing and mild central canalStenosis.    Currently in Pain? Yes    Pain Score 4     Pain Location Head    Pain Descriptors / Indicators --   stinging   Pain Type Chronic pain    Pain Onset More than a month ago    Aggravating Factors  turning his head    Pain Relieving Factors NA    Effect of Pain on Daily Activities cant read, cant  lift    Multiple Pain Sites No            Treatment: MH to neck during treatment. Chin tucks x 5 sec x 10 reps  AROM neck rotation left and right x 10  AROM neck SBL, SBR x 10  Scapula retraction x 10  Scapula protraction x 10  Scapula depression x 10    Patient performed with instruction, verbal cues, tactile cues of therapist: goal: increase tissue extensibility, promote proper posture, improve mobility                       PT Education - 08/23/20 1356    Education Details HEP    Person(s) Educated Patient    Methods Explanation    Comprehension Verbalized understanding            PT Short Term Goals - 08/21/20 1425      PT SHORT TERM GOAL #1   Title Patient will improve FOTO score to  indicate improved functional mobility    Time 4    Period Weeks    Status New    Target Date 09/18/20             PT Long Term Goals - 08/21/20 1426      PT LONG TERM GOAL #1   Title Patient will be independent in home exercise program to improve strength/mobility for better functional independence with ADLs.    Time 8    Period Weeks    Status New    Target Date 10/16/20      PT LONG TERM GOAL #2   Title Patient will decreased NDI score 8points to demonstrate better functional mobility    Time 8    Period Weeks    Status New    Target Date 10/16/20      PT LONG TERM GOAL #3   Title Patient will be able to perform household work/ chores without increase in symptoms.    Time 8    Period Weeks    Status New    Target Date 10/16/20      PT LONG TERM GOAL #4   Title Patient will report a worst pain of 0/10 on VAS in neck  to improve tolerance with ADLs and reduced symptoms with activities.    Time 8    Period Weeks    Status New    Target Date 10/16/20                 Plan - 08/23/20 1357    Clinical Impression Statement Patient is having increased neck and head pain today. Educated patient that he needs to reach out to MD with new complaints of pain for follow up recommendations. Patient performed cervical AROM in supine position including rotation left and right, SB left and right, and chin tuck; patient also completed thoracic strengthening including retraction, protraction and depression. Patient has increased neck and head pain that needs to be addressed with MD for further plans to decrease  pain.    Examination-Activity Limitations Reach Overhead;Carry;Lift    Examination-Participation Restrictions Yard Work;Cleaning    Stability/Clinical Decision Making Stable/Uncomplicated    Rehab Potential Fair    PT Frequency 2x / week    PT Duration 8 weeks    PT Treatment/Interventions Moist Heat;Electrical Stimulation;Patient/family education;Therapeutic  exercise;Therapeutic activities    PT Next Visit Plan posture training    PT Home Exercise Plan posture training    Consulted and Agree  with Plan of Care Patient           Patient will benefit from skilled therapeutic intervention in order to improve the following deficits and impairments:  Pain, Decreased activity tolerance, Decreased strength, Postural dysfunction, Decreased range of motion, Increased muscle spasms, Impaired flexibility  Visit Diagnosis: Neck pain  Muscle weakness (generalized)     Problem List Patient Active Problem List   Diagnosis Date Noted  . Left sided numbness 02/13/2020  . Hyperlipidemia 12/07/2019  . Radiculopathy, cervical and lumbar 12/06/2019  . Abdominal pain, epigastric   . Left flank pain 08/15/2019  . Elevated LFTs 08/10/2019  . Hypogammaglobulinemia ([place]) 05/17/2019  . Excessive daytime sleepiness 03/28/2019  . Cataplexy 03/28/2019  . OSA on CPAP 03/28/2019  . Acute pain of left knee 04/18/2018  . Cutaneous skin tags 04/18/2018  . Lower extremity edema 04/18/2018  . Thoracic outlet syndrome 02/19/2018  . Pain in limb 02/19/2018  . Muscle strain 09/01/2017  . Chest pain 03/21/2017  . Dyspnea on exertion 02/19/2017  . Language difficulty 02/19/2017  . BPH with obstruction/lower urinary tract symptoms 12/03/2015  . Medication refill 09/13/2015  . Erectile disorder, generalized, mild 07/26/2015  . Hypogonadism in male 05/24/2015  . Environmental allergies 04/13/2015  . Sleep apnea 04/13/2015  . Migraines 04/13/2015  . Neck pain 04/13/2015  . Adjustment disorder with mixed anxiety and depressed mood 04/13/2015  . Psoriasis 04/13/2015  . History of kidney stones 04/13/2015  . Neuropathy 04/13/2015  . Benign fibroma of prostate 03/24/2015  . Hypertension 03/24/2015  . Elevation of level of transaminase or lactic acid dehydrogenase (LDH) 03/24/2015  . Fatigue 03/24/2015    Alanson Puls, PT DPT 08/23/2020, 1:58 PM  [place] MAIN [facility] SERVICES [address] [place], [place], [address] Phone: [phone]   Fax:  [phone]  Name: Daniel Calderon MRN: [id number] Date of Birth: [birth year]

## 2020-08-29 ENCOUNTER — Ambulatory Visit: Payer: No Typology Code available for payment source

## 2020-08-29 ENCOUNTER — Other Ambulatory Visit: Payer: Self-pay

## 2020-08-29 ENCOUNTER — Ambulatory Visit: Payer: No Typology Code available for payment source | Admitting: Physical Therapy

## 2020-08-29 DIAGNOSIS — M542 Cervicalgia: Secondary | ICD-10-CM

## 2020-08-29 DIAGNOSIS — M6281 Muscle weakness (generalized): Secondary | ICD-10-CM

## 2020-08-29 NOTE — Therapy (Signed)
[place] MAIN [facility] SERVICES [address] [place], [place], [address] Phone: [phone]   Fax:  [phone]  Physical Therapy Treatment  Patient Details  Name: Daniel Calderon MRN: [id number] Date of Birth: [birth year] Referring Provider (PT): Gurney Maxin   Encounter Date: 08/29/2020   PT End of Session - 08/29/20 1450    Visit Number 3    Number of Visits 17    Date for PT Re-Evaluation 10/16/20    PT Start Time 1607    PT Stop Time 3710    PT Time Calculation (min) 28 min    Activity Tolerance Patient tolerated treatment well;No increased pain    Behavior During Therapy WFL for tasks assessed/performed           Past Medical History:  Diagnosis Date  . Allergy    Seasonal  . Anxiety   . Benign enlargement of prostate   . Depression   . Elevated BP   . Elevated transaminase level   . Failure of erection   . Fatigue   . Frequent headaches   . GERD (gastroesophageal reflux disease)   . History of kidney stones   . Hypertension   . Hypogonadism in male   . Migraine   . Sinus congestion 09/01/2017  . Sleep apnea    Currently uses cpap  . Thoracic outlet syndrome    Left Arm  . Tongue pain 02/09/2019    Past Surgical History:  Procedure Laterality Date  . COLONOSCOPY WITH PROPOFOL N/A 11/11/2017   Procedure: COLONOSCOPY WITH PROPOFOL;  Surgeon: Lin Landsman, MD;  Location: [place];  Service: Endoscopy;  Laterality: N/A;  . ESOPHAGOGASTRODUODENOSCOPY (EGD) WITH PROPOFOL N/A 08/25/2019   Procedure: ESOPHAGOGASTRODUODENOSCOPY (EGD) WITH PROPOFOL;  Surgeon: Lin Landsman, MD;  Location: [facility];  Service: Gastroenterology;  Laterality: N/A;  . KNEE ARTHROSCOPY WITH MEDIAL MENISECTOMY Left 07/26/2018   Procedure: KNEE ARTHROSCOPY WITH PARTIAL MEDIAL MENISECTOMY;  Surgeon: Leim Fabry, MD;  Location: [facility];  Service: Orthopedics;  Laterality: Left;  . Left Shoulder Surgery  2011  . nerve  block     2 in neck. 5 in back.  . NOSE SURGERY    . POLYPECTOMY  11/11/2017   Procedure: POLYPECTOMY;  Surgeon: Lin Landsman, MD;  Location: [place];  Service: Endoscopy;;  . SCALENE NODE BIOPSY / EXCISION    . VASECTOMY      There were no vitals filed for this visit.   Subjective Assessment - 08/29/20 1447    Subjective Patient reports that his neck began hurting again this AM. He is having 8/10 neck, L scapula and L eye pain. He is also complaining of his head stinging.    Pertinent History Patient states he is having constant left sided neck pain. Describes pain as 1/10.Continues to have left sided numbness. Cervical spine spondylosis most notable at C6-7 with a right lateralrecess disc protrusion with an annular fissure which contacts andimpinges the right C7 nerve root. There is also moderate to severebilateral neural foraminal narrowing and mild central canalStenosis.    Currently in Pain? Yes    Pain Score 8     Pain Location Head    Pain Orientation Left    Pain Descriptors / Indicators --   Stinging   Pain Type Chronic pain    Pain Onset More than a month ago    Pain Frequency Constant    Aggravating Factors  turning his head  Pain Relieving Factors NA    Effect of Pain on Daily Activities can't read, can't lift                TREATMENT   Trigger Point Dry Needling (TDN), unbilled Education performed with patient regarding potential benefit of TDN. Reviewed precautions and risks with patient. Reviewed special precautions/risks over lung fields which include pneumothorax. Reviewed signs and symptoms of pneumothorax and advised pt to go to ER immediately if these symptoms develop advise them of dry needling treatment. Extensive time spent with pt to ensure full understanding of TDN risks. Pt provided verbal consent to treatment. TDN performed to L upper trap with 2, 0.30 x 60 single needle placements with local twitch response (LTR). Also performed  TDN to L cervical multifi and L suboccipitals with 1, 0.25 x 40 single needle placement in each. Utilized 2, 0.30 x 60 single needle placements to L rhomboid major and L subscapularis. Pistoning technique utilized. Improved pain-free motion following intervention.     Manual Therapy  STM to left upper trap, left rhomboids, left levator scapula, and left posterior cervical paraspinals with intermittent trigger point release. Grade 3 thoracic mobilizations T3-T6, 20 seconds per bout, 2 bouts per level   Patient reports significant increase in his pain today upon arrival.  Interested in pursuing trigger point dry needling treatment.  Trigger point dry needling performed to left upper trap, left cervical multifidus, left suboccipitals, and left rhomboid major/subscapularis.  Patient reports immediate relief of his headache following dry needling as well as improved cervical motion demonstrated.  Patient encouraged to continue HEP and follow-up as scheduled. Pt will benefit from PT services to address deficits in pain and strength in order to return to full function at home and work with less pain.                       PT Short Term Goals - 08/21/20 1425      PT SHORT TERM GOAL #1   Title Patient will improve FOTO score to indicate improved functional mobility    Time 4    Period Weeks    Status New    Target Date 09/18/20             PT Long Term Goals - 08/21/20 1426      PT LONG TERM GOAL #1   Title Patient will be independent in home exercise program to improve strength/mobility for better functional independence with ADLs.    Time 8    Period Weeks    Status New    Target Date 10/16/20      PT LONG TERM GOAL #2   Title Patient will decreased NDI score 8points to demonstrate better functional mobility    Time 8    Period Weeks    Status New    Target Date 10/16/20      PT LONG TERM GOAL #3   Title Patient will be able to perform household work/ chores  without increase in symptoms.    Time 8    Period Weeks    Status New    Target Date 10/16/20      PT LONG TERM GOAL #4   Title Patient will report a worst pain of 0/10 on VAS in neck  to improve tolerance with ADLs and reduced symptoms with activities.    Time 8    Period Weeks    Status New    Target Date 10/16/20  Plan - 08/29/20 1450    Clinical Impression Statement Patient reports significant increase in his pain today upon arrival.  Interested in pursuing trigger point dry needling treatment.  Trigger point dry needling performed to left upper trap, left cervical multifidus, left suboccipitals, and left rhomboid major/subscapularis.  Patient reports immediate relief of his headache following dry needling as well as improved cervical motion demonstrated.  Patient encouraged to continue HEP and follow-up as scheduled. Pt will benefit from PT services to address deficits in pain and strength in order to return to full function at home and work with less pain.    Examination-Activity Limitations Reach Overhead;Carry;Lift    Examination-Participation Restrictions Yard Work;Cleaning    Stability/Clinical Decision Making Stable/Uncomplicated    Rehab Potential Fair    PT Frequency 2x / week    PT Duration 8 weeks    PT Treatment/Interventions Moist Heat;Electrical Stimulation;Patient/family education;Therapeutic exercise;Therapeutic activities    PT Next Visit Plan consider repeating TDN if it was effective, progress cervical and upper quarter strengthening    PT Home Exercise Plan posture training    Consulted and Agree with Plan of Care Patient           Patient will benefit from skilled therapeutic intervention in order to improve the following deficits and impairments:  Pain, Decreased activity tolerance, Decreased strength, Postural dysfunction, Decreased range of motion, Increased muscle spasms, Impaired flexibility  Visit Diagnosis: Neck pain  Muscle  weakness (generalized)     Problem List Patient Active Problem List   Diagnosis Date Noted  . Left sided numbness 02/13/2020  . Hyperlipidemia 12/07/2019  . Radiculopathy, cervical and lumbar 12/06/2019  . Abdominal pain, epigastric   . Left flank pain 08/15/2019  . Elevated LFTs 08/10/2019  . Hypogammaglobulinemia ([place]) 05/17/2019  . Excessive daytime sleepiness 03/28/2019  . Cataplexy 03/28/2019  . OSA on CPAP 03/28/2019  . Acute pain of left knee 04/18/2018  . Cutaneous skin tags 04/18/2018  . Lower extremity edema 04/18/2018  . Thoracic outlet syndrome 02/19/2018  . Pain in limb 02/19/2018  . Muscle strain 09/01/2017  . Chest pain 03/21/2017  . Dyspnea on exertion 02/19/2017  . Language difficulty 02/19/2017  . BPH with obstruction/lower urinary tract symptoms 12/03/2015  . Medication refill 09/13/2015  . Erectile disorder, generalized, mild 07/26/2015  . Hypogonadism in male 05/24/2015  . Environmental allergies 04/13/2015  . Sleep apnea 04/13/2015  . Migraines 04/13/2015  . Neck pain 04/13/2015  . Adjustment disorder with mixed anxiety and depressed mood 04/13/2015  . Psoriasis 04/13/2015  . History of kidney stones 04/13/2015  . Neuropathy 04/13/2015  . Benign fibroma of prostate 03/24/2015  . Hypertension 03/24/2015  . Elevation of level of transaminase or lactic acid dehydrogenase (LDH) 03/24/2015  . Fatigue 03/24/2015   Phillips Grout PT, DPT, GCS  Deanza Upperman 08/30/2020, 8:40 AM  [place] MAIN [facility] SERVICES [address] [place], [place], [address] Phone: [phone]   Fax:  [phone]  Name: ARLENE GENOVA MRN: [id number] Date of Birth: January 25, 1966

## 2020-08-31 ENCOUNTER — Other Ambulatory Visit: Payer: Self-pay | Admitting: Family Medicine

## 2020-09-04 ENCOUNTER — Ambulatory Visit: Payer: No Typology Code available for payment source

## 2020-09-04 ENCOUNTER — Other Ambulatory Visit: Payer: Self-pay

## 2020-09-04 DIAGNOSIS — M6281 Muscle weakness (generalized): Secondary | ICD-10-CM

## 2020-09-04 DIAGNOSIS — M542 Cervicalgia: Secondary | ICD-10-CM | POA: Diagnosis not present

## 2020-09-04 NOTE — Therapy (Signed)
[place] [facility] [address] [place], [place], [address] Phone: [phone]   Fax:  [phone]  Physical Therapy Treatment  Patient Details  Name: Daniel Calderon MRN: [id number] Date of Birth: 24-Dec-1965 Referring Provider (PT): Gurney Maxin   Encounter Date: 09/04/2020   PT End of Session - 09/04/20 1116    Visit Number 4    Number of Visits 17    Date for PT Re-Evaluation 10/16/20    PT Start Time 5176    PT Stop Time 1145    PT Time Calculation (min) 40 min    Activity Tolerance Patient tolerated treatment well;No increased pain    Behavior During Therapy WFL for tasks assessed/performed           Past Medical History:  Diagnosis Date   Allergy    Seasonal   Anxiety    Benign enlargement of prostate    Depression    Elevated BP    Elevated transaminase level    Failure of erection    Fatigue    Frequent headaches    GERD (gastroesophageal reflux disease)    History of kidney stones    Hypertension    Hypogonadism in male    Migraine    Sinus congestion 09/01/2017   Sleep apnea    Currently uses cpap   Thoracic outlet syndrome    Left Arm   Tongue pain 02/09/2019    Past Surgical History:  Procedure Laterality Date   COLONOSCOPY WITH PROPOFOL N/A 11/11/2017   Procedure: COLONOSCOPY WITH PROPOFOL;  Surgeon: Lin Landsman, MD;  Location: [place];  Service: Endoscopy;  Laterality: N/A;   ESOPHAGOGASTRODUODENOSCOPY (EGD) WITH PROPOFOL N/A 08/25/2019   Procedure: ESOPHAGOGASTRODUODENOSCOPY (EGD) WITH PROPOFOL;  Surgeon: Lin Landsman, MD;  Location: [place];  Service: Gastroenterology;  Laterality: N/A;   KNEE ARTHROSCOPY WITH MEDIAL MENISECTOMY Left 07/26/2018   Procedure: KNEE ARTHROSCOPY WITH PARTIAL MEDIAL MENISECTOMY;  Surgeon: Leim Fabry, MD;  Location: [facility];  Service: Orthopedics;  Laterality: Left;   Left Shoulder Surgery  2011   nerve  block     2 in neck. 5 in back.   NOSE SURGERY     POLYPECTOMY  11/11/2017   Procedure: POLYPECTOMY;  Surgeon: Lin Landsman, MD;  Location: [place];  Service: Endoscopy;;   SCALENE NODE BIOPSY / EXCISION     VASECTOMY      There were no vitals filed for this visit.   Subjective Assessment - 09/04/20 1108    Subjective Patient reports that he felt well after the last therapy session however the following day he had a lot of nausea. He also had chills but doesn't believe that it was related to the treatment. He is having 8/10 neck, L scapula and L eye pain. He is also still complaining of his head stinging today.    Pertinent History Patient states he is having constant left sided neck pain. Describes pain as 1/10.Continues to have left sided numbness. Cervical spine spondylosis most notable at C6-7 with a right lateralrecess disc protrusion with an annular fissure which contacts andimpinges the right C7 nerve root. There is also moderate to severebilateral neural foraminal narrowing and mild central canalStenosis.    Currently in Pain? Yes    Pain Score 8     Pain Location Head    Pain Orientation Left    Pain Descriptors / Indicators --   Stinging   Pain Onset More than a  month ago    Pain Frequency Constant               TREATMENT   Trigger Point Dry Needling (TDN), unbilled Education performed with patient regarding potential benefit of TDN. Reviewed precautions and risks with patient and pt has been previously educated regarding risks. Pt provided verbal consent to treatment. TDN performed to L upper trap with 2, 0.30 x 60 single needle placements with local twitch response (LTR). Also performed TDN to L levator scapulae with 1, 0.30 x 60 single needle placement as well as and L suboccipitals with 2, 0.25 x 40 single needle placements. Pistoning technique utilized. Improved pain-free motion following intervention.     Manual Therapy  Moist heat applied  to posterior cervical spine and upper thoracic spine at start of session (unbilled); STM to left upper trap, left rhomboids, left levator scapula, and left posterior cervical paraspinals with intermittent trigger point release. Grade III C2-T4 CPA and L UPA, 20 seconds per bout, x 1 bout each;   Patient reports improvement in his symptoms following last session.  Trigger point dry needling performed again today to left upper trap, left levator scapulae, and left suboccipitals. Patient reports immediate reproduction as well as relief of his headache following dry needling as well as improved cervical motion demonstrated. Patient encouraged to continue HEP and follow-up as scheduled. Pt will benefit from PT services to address deficits in pain and strength in order to return to full function at home and work with less pain.                               PT Short Term Goals - 08/21/20 1425      PT SHORT TERM GOAL #1   Title Patient will improve FOTO score to indicate improved functional mobility    Time 4    Period Weeks    Status New    Target Date 09/18/20             PT Long Term Goals - 08/21/20 1426      PT LONG TERM GOAL #1   Title Patient will be independent in home exercise program to improve strength/mobility for better functional independence with ADLs.    Time 8    Period Weeks    Status New    Target Date 10/16/20      PT LONG TERM GOAL #2   Title Patient will decreased NDI score 8points to demonstrate better functional mobility    Time 8    Period Weeks    Status New    Target Date 10/16/20      PT LONG TERM GOAL #3   Title Patient will be able to perform household work/ chores without increase in symptoms.    Time 8    Period Weeks    Status New    Target Date 10/16/20      PT LONG TERM GOAL #4   Title Patient will report a worst pain of 0/10 on VAS in neck  to improve tolerance with ADLs and reduced symptoms with activities.    Time  8    Period Weeks    Status New    Target Date 10/16/20                 Plan - 09/04/20 1116    Clinical Impression Statement Patient reports improvement in his symptoms following last session.  Trigger point  dry needling performed again today to left upper trap, left levator scapulae, and left suboccipitals. Patient reports immediate reproduction as well as relief of his headache following dry needling as well as improved cervical motion demonstrated. Patient encouraged to continue HEP and follow-up as scheduled. Pt will benefit from PT services to address deficits in pain and strength in order to return to full function at home and work with less pain.    Examination-Activity Limitations Reach Overhead;Carry;Lift    Examination-Participation Restrictions Yard Work;Cleaning    Stability/Clinical Decision Making Stable/Uncomplicated    Rehab Potential Fair    PT Frequency 2x / week    PT Duration 8 weeks    PT Treatment/Interventions Moist Heat;Electrical Stimulation;Patient/family education;Therapeutic exercise;Therapeutic activities    PT Next Visit Plan consider repeating TDN if it was effective, progress cervical and upper quarter strengthening    PT Home Exercise Plan posture training    Consulted and Agree with Plan of Care Patient           Patient will benefit from skilled therapeutic intervention in order to improve the following deficits and impairments:  Pain, Decreased activity tolerance, Decreased strength, Postural dysfunction, Decreased range of motion, Increased muscle spasms, Impaired flexibility  Visit Diagnosis: Neck pain  Muscle weakness (generalized)     Problem List Patient Active Problem List   Diagnosis Date Noted   Left sided numbness 02/13/2020   Hyperlipidemia 12/07/2019   Radiculopathy, cervical and lumbar 12/06/2019   Abdominal pain, epigastric    Left flank pain 08/15/2019   Elevated LFTs 08/10/2019   Hypogammaglobulinemia ([place])  05/17/2019   Excessive daytime sleepiness 03/28/2019   Cataplexy 03/28/2019   OSA on CPAP 03/28/2019   Acute pain of left knee 04/18/2018   Cutaneous skin tags 04/18/2018   Lower extremity edema 04/18/2018   Thoracic outlet syndrome 02/19/2018   Pain in limb 02/19/2018   Muscle strain 09/01/2017   Chest pain 03/21/2017   Dyspnea on exertion 02/19/2017   Language difficulty 02/19/2017   BPH with obstruction/lower urinary tract symptoms 12/03/2015   Medication refill 09/13/2015   Erectile disorder, generalized, mild 07/26/2015   Hypogonadism in male 05/24/2015   Environmental allergies 04/13/2015   Sleep apnea 04/13/2015   Migraines 04/13/2015   Neck pain 04/13/2015   Adjustment disorder with mixed anxiety and depressed mood 04/13/2015   Psoriasis 04/13/2015   History of kidney stones 04/13/2015   Neuropathy 04/13/2015   Benign fibroma of prostate 03/24/2015   Hypertension 03/24/2015   Elevation of level of transaminase or lactic acid dehydrogenase (LDH) 03/24/2015   Fatigue 03/24/2015   Phillips Grout PT, DPT, GCS  Arzella Rehmann 09/04/2020, 12:32 PM  [place] [facility] [address] [place], [place], [address] Phone: [phone]   Fax:  [phone]  Name: EDAHI KROENING MRN: [id number] Date of Birth: 1966/10/10

## 2020-09-06 ENCOUNTER — Ambulatory Visit: Payer: No Typology Code available for payment source | Admitting: Physical Therapy

## 2020-09-11 ENCOUNTER — Encounter: Payer: Self-pay | Admitting: Physical Therapy

## 2020-09-11 ENCOUNTER — Other Ambulatory Visit: Payer: Self-pay

## 2020-09-11 ENCOUNTER — Ambulatory Visit: Payer: No Typology Code available for payment source | Admitting: Physical Therapy

## 2020-09-11 DIAGNOSIS — M542 Cervicalgia: Secondary | ICD-10-CM

## 2020-09-11 DIAGNOSIS — M6281 Muscle weakness (generalized): Secondary | ICD-10-CM

## 2020-09-11 NOTE — Therapy (Signed)
[place] [facility] SERVICES [address] [place], [place], [address] Phone: [phone]   Fax:  [phone]  Physical Therapy Treatment  Patient Details  Name: JERY HOLLERN MRN: [id number] Date of Birth: March 04, 1966 Referring Provider (PT): Gurney Maxin   Encounter Date: 09/11/2020   PT End of Session - 09/11/20 1027    Visit Number 5    Number of Visits 17    Date for PT Re-Evaluation 10/16/20    PT Start Time 8786    PT Stop Time 1100    PT Time Calculation (min) 45 min    Activity Tolerance Patient tolerated treatment well;No increased pain    Behavior During Therapy WFL for tasks assessed/performed           Past Medical History:  Diagnosis Date  . Allergy    Seasonal  . Anxiety   . Benign enlargement of prostate   . Depression   . Elevated BP   . Elevated transaminase level   . Failure of erection   . Fatigue   . Frequent headaches   . GERD (gastroesophageal reflux disease)   . History of kidney stones   . Hypertension   . Hypogonadism in male   . Migraine   . Sinus congestion 09/01/2017  . Sleep apnea    Currently uses cpap  . Thoracic outlet syndrome    Left Arm  . Tongue pain 02/09/2019    Past Surgical History:  Procedure Laterality Date  . COLONOSCOPY WITH PROPOFOL N/A 11/11/2017   Procedure: COLONOSCOPY WITH PROPOFOL;  Surgeon: Lin Landsman, MD;  Location: [place];  Service: Endoscopy;  Laterality: N/A;  . ESOPHAGOGASTRODUODENOSCOPY (EGD) WITH PROPOFOL N/A 08/25/2019   Procedure: ESOPHAGOGASTRODUODENOSCOPY (EGD) WITH PROPOFOL;  Surgeon: Lin Landsman, MD;  Location: [facility];  Service: Gastroenterology;  Laterality: N/A;  . KNEE ARTHROSCOPY WITH MEDIAL MENISECTOMY Left 07/26/2018   Procedure: KNEE ARTHROSCOPY WITH PARTIAL MEDIAL MENISECTOMY;  Surgeon: Leim Fabry, MD;  Location: [facility];  Service: Orthopedics;  Laterality: Left;  . Left Shoulder Surgery  2011  . nerve  block     2 in neck. 5 in back.  . NOSE SURGERY    . POLYPECTOMY  11/11/2017   Procedure: POLYPECTOMY;  Surgeon: Lin Landsman, MD;  Location: [place];  Service: Endoscopy;;  . SCALENE NODE BIOPSY / EXCISION    . VASECTOMY      There were no vitals filed for this visit.   Subjective Assessment - 09/11/20 1024    Subjective Patient reports that his neck is huring 3/10 and occassional shooting pain that is higher but does not last long. He is still working full time.    Pertinent History Patient states he is having constant left sided neck pain. Describes pain as 1/10.Continues to have left sided numbness. Cervical spine spondylosis most notable at C6-7 with a right lateralrecess disc protrusion with an annular fissure which contacts andimpinges the right C7 nerve root. There is also moderate to severebilateral neural foraminal narrowing and mild central canalStenosis.    Currently in Pain? Yes    Pain Score 3     Pain Location Neck    Pain Orientation Left    Pain Descriptors / Indicators Stabbing    Pain Type Chronic pain    Pain Onset More than a month ago    Aggravating Factors  SBR, extension    Pain Relieving Factors NA    Effect of Pain on Daily  Activities can't read, can't lift           Treatment: UBE x 5 mins , 3 1/2 mins fwd,  1/2 min bwd, 2.4 resistance- pain increased to L side of neck  Prone shoulder extension x 15 x 2 no weight  Prone scapula retraction x 15 x 2 , no weight  Supine cervical SBR, SBL, rotation L, rotation R x 15 Seated scapula retraction with RTB x 15  Seated scapula protraction with RTB x 15  Seated scapula depression x 15  Seated pectoralis stretch x 30 sec x 3  Left and right shoulder stretch downward and extension x 30 sec x 3  Patients pain changes from 3/10 to 6/10 to 0/10 depending on positions and exercises and his eye has symptoms of not being able to be open, visual changes and light sensitivity . Following pectoralis  stretch he recovered to 0/10  With no eye pain , no vision disturbances and no light sensitivity  Educated about adding pectoralis stretch when he begins to have pain.                            PT Education - 09/11/20 1026    Education Details HEP    Person(s) Educated Patient    Methods Explanation    Comprehension Verbalized understanding;Need further instruction            PT Short Term Goals - 08/21/20 1425      PT SHORT TERM GOAL #1   Title Patient will improve FOTO score to indicate improved functional mobility    Time 4    Period Weeks    Status New    Target Date 09/18/20             PT Long Term Goals - 08/21/20 1426      PT LONG TERM GOAL #1   Title Patient will be independent in home exercise program to improve strength/mobility for better functional independence with ADLs.    Time 8    Period Weeks    Status New    Target Date 10/16/20      PT LONG TERM GOAL #2   Title Patient will decreased NDI score 8points to demonstrate better functional mobility    Time 8    Period Weeks    Status New    Target Date 10/16/20      PT LONG TERM GOAL #3   Title Patient will be able to perform household work/ chores without increase in symptoms.    Time 8    Period Weeks    Status New    Target Date 10/16/20      PT LONG TERM GOAL #4   Title Patient will report a worst pain of 0/10 on VAS in neck  to improve tolerance with ADLs and reduced symptoms with activities.    Time 8    Period Weeks    Status New    Target Date 10/16/20                 Plan - 09/11/20 1028    Clinical Impression Statement Patient presents to therapy with 3/10 pain that gets worse with minimal cervical and thoracic AROM with pain behaviors that increase pain to 6/10 stabbing pain and goes away with supine cervical rotation AROM right and left. He has high irritability and fast recovery with changing position to relieve pain.  He has poor tolerance to  minimal exercises without resistance and without gravity.  He responds to pectoralis stretch bilaterally with relief. Patient will continue to benefit from skilled PT to improve mobility and decrease pain to cervical area.    Examination-Activity Limitations Reach Overhead;Carry;Lift    Examination-Participation Restrictions Yard Work;Cleaning    Stability/Clinical Decision Making Stable/Uncomplicated    Rehab Potential Fair    PT Frequency 2x / week    PT Duration 8 weeks    PT Treatment/Interventions Moist Heat;Electrical Stimulation;Patient/family education;Therapeutic exercise;Therapeutic activities    PT Next Visit Plan consider repeating TDN if it was effective, progress cervical and upper quarter strengthening    PT Home Exercise Plan posture training    Consulted and Agree with Plan of Care Patient           Patient will benefit from skilled therapeutic intervention in order to improve the following deficits and impairments:  Pain, Decreased activity tolerance, Decreased strength, Postural dysfunction, Decreased range of motion, Increased muscle spasms, Impaired flexibility  Visit Diagnosis: Muscle weakness (generalized)  Neck pain     Problem List Patient Active Problem List   Diagnosis Date Noted  . Left sided numbness 02/13/2020  . Hyperlipidemia 12/07/2019  . Radiculopathy, cervical and lumbar 12/06/2019  . Abdominal pain, epigastric   . Left flank pain 08/15/2019  . Elevated LFTs 08/10/2019  . Hypogammaglobulinemia ([place]) 05/17/2019  . Excessive daytime sleepiness 03/28/2019  . Cataplexy 03/28/2019  . OSA on CPAP 03/28/2019  . Acute pain of left knee 04/18/2018  . Cutaneous skin tags 04/18/2018  . Lower extremity edema 04/18/2018  . Thoracic outlet syndrome 02/19/2018  . Pain in limb 02/19/2018  . Muscle strain 09/01/2017  . Chest pain 03/21/2017  . Dyspnea on exertion 02/19/2017  . Language difficulty 02/19/2017  . BPH with obstruction/lower urinary tract  symptoms 12/03/2015  . Medication refill 09/13/2015  . Erectile disorder, generalized, mild 07/26/2015  . Hypogonadism in male 05/24/2015  . Environmental allergies 04/13/2015  . Sleep apnea 04/13/2015  . Migraines 04/13/2015  . Neck pain 04/13/2015  . Adjustment disorder with mixed anxiety and depressed mood 04/13/2015  . Psoriasis 04/13/2015  . History of kidney stones 04/13/2015  . Neuropathy 04/13/2015  . Benign fibroma of prostate 03/24/2015  . Hypertension 03/24/2015  . Elevation of level of transaminase or lactic acid dehydrogenase (LDH) 03/24/2015  . Fatigue 03/24/2015    Alanson Puls, PT DPT 09/11/2020, 10:28 AM  [place] [facility] SERVICES [address] [place], [place], [address] Phone: [phone]   Fax:  [phone]  Name: GIFFORD BALLON MRN: [id number] Date of Birth: Apr 30, 1966

## 2020-09-13 ENCOUNTER — Encounter: Payer: Self-pay | Admitting: Physical Therapy

## 2020-09-13 ENCOUNTER — Ambulatory Visit: Payer: No Typology Code available for payment source | Admitting: Physical Therapy

## 2020-09-13 ENCOUNTER — Other Ambulatory Visit: Payer: Self-pay

## 2020-09-13 DIAGNOSIS — M6281 Muscle weakness (generalized): Secondary | ICD-10-CM

## 2020-09-13 DIAGNOSIS — M542 Cervicalgia: Secondary | ICD-10-CM | POA: Diagnosis not present

## 2020-09-13 NOTE — Therapy (Signed)
[place] MAIN [facility] SERVICES [address] [place], [place], [address] Phone: [phone]   Fax:  [phone]  Physical Therapy Treatment  Patient Details  Name: Daniel Calderon MRN: [id number] Date of Birth: 03/18/1966 Referring Provider (PT): Gurney Maxin   Encounter Date: 09/13/2020   PT End of Session - 09/13/20 0939    Visit Number 6    Number of Visits 17    Date for PT Re-Evaluation 10/16/20    PT Start Time 0930    PT Stop Time 5956    PT Time Calculation (min) 25 min    Activity Tolerance Patient tolerated treatment well;No increased pain    Behavior During Therapy WFL for tasks assessed/performed           Past Medical History:  Diagnosis Date   Allergy    Seasonal   Anxiety    Benign enlargement of prostate    Depression    Elevated BP    Elevated transaminase level    Failure of erection    Fatigue    Frequent headaches    GERD (gastroesophageal reflux disease)    History of kidney stones    Hypertension    Hypogonadism in male    Migraine    Sinus congestion 09/01/2017   Sleep apnea    Currently uses cpap   Thoracic outlet syndrome    Left Arm   Tongue pain 02/09/2019    Past Surgical History:  Procedure Laterality Date   COLONOSCOPY WITH PROPOFOL N/A 11/11/2017   Procedure: COLONOSCOPY WITH PROPOFOL;  Surgeon: Lin Landsman, MD;  Location: [place];  Service: Endoscopy;  Laterality: N/A;   ESOPHAGOGASTRODUODENOSCOPY (EGD) WITH PROPOFOL N/A 08/25/2019   Procedure: ESOPHAGOGASTRODUODENOSCOPY (EGD) WITH PROPOFOL;  Surgeon: Lin Landsman, MD;  Location: [place];  Service: Gastroenterology;  Laterality: N/A;   KNEE ARTHROSCOPY WITH MEDIAL MENISECTOMY Left 07/26/2018   Procedure: KNEE ARTHROSCOPY WITH PARTIAL MEDIAL MENISECTOMY;  Surgeon: Leim Fabry, MD;  Location: [facility];  Service: Orthopedics;  Laterality: Left;   Left Shoulder Surgery  2011   nerve  block     2 in neck. 5 in back.   NOSE SURGERY     POLYPECTOMY  11/11/2017   Procedure: POLYPECTOMY;  Surgeon: Lin Landsman, MD;  Location: [place];  Service: Endoscopy;;   SCALENE NODE BIOPSY / EXCISION     VASECTOMY      There were no vitals filed for this visit.   Subjective Assessment - 09/13/20 0941    Subjective Patient has 6/10 pain today with left eye involvement but no pain in the eye. The weather changed and got cooler and her felt the pain getting worse with the weather    Pertinent History Patient states he is having constant left sided neck pain. Describes pain as 1/10.Continues to have left sided numbness. Cervical spine spondylosis most notable at C6-7 with a right lateralrecess disc protrusion with an annular fissure which contacts andimpinges the right C7 nerve root. There is also moderate to severebilateral neural foraminal narrowing and mild central canalStenosis.    Currently in Pain? Yes    Pain Score 6     Pain Location Neck    Pain Orientation Left    Pain Descriptors / Indicators Stabbing    Pain Type Chronic pain    Pain Onset More than a month ago    Pain Frequency Constant          Treatment: UBE x  2 mins with symptoms getting worse- stopped Supine AROM cervical rotation left and right with symptoms not being able to be tolerated : increased pain and left eye not being open as far-stopped  Stretching BUE into abd and extension with palms up x 30 sec x 3 sets with symptom reduction each repetition Manual stretching of B shoulders into posterior and depression with acute symptom relief and L eye being able to open better Tapping  to keep B shoulders/ scapula in depression and retraction  Patient performed with instruction, verbal cues, tactile cues of therapist: goal: increase tissue extensibility, promote proper posture, improve mobility                           PT Education - 09/13/20 0938    Education  Details HEP    Person(s) Educated Patient    Methods Explanation    Comprehension Verbalized understanding            PT Short Term Goals - 08/21/20 1425      PT SHORT TERM GOAL #1   Title Patient will improve FOTO score to indicate improved functional mobility    Time 4    Period Weeks    Status New    Target Date 09/18/20             PT Long Term Goals - 08/21/20 1426      PT LONG TERM GOAL #1   Title Patient will be independent in home exercise program to improve strength/mobility for better functional independence with ADLs.    Time 8    Period Weeks    Status New    Target Date 10/16/20      PT LONG TERM GOAL #2   Title Patient will decreased NDI score 8points to demonstrate better functional mobility    Time 8    Period Weeks    Status New    Target Date 10/16/20      PT LONG TERM GOAL #3   Title Patient will be able to perform household work/ chores without increase in symptoms.    Time 8    Period Weeks    Status New    Target Date 10/16/20      PT LONG TERM GOAL #4   Title Patient will report a worst pain of 0/10 on VAS in neck  to improve tolerance with ADLs and reduced symptoms with activities.    Time 8    Period Weeks    Status New    Target Date 10/16/20                 Plan - 09/13/20 0940    Clinical Impression Statement Patient is making slow  progress with PT due to irratibiilty of symptoms. They change quickly and can be mild and change to severe quickly depending on.activity, position and weather.  He has reduction in pain symptoms with passive stretch to shoulders for posture correction. He was tapped today into shoulder depression and retraction with symptom reductions. He might benefit from a posture brace for shoulder retraction to assist with better posture.    Examination-Activity Limitations Reach Overhead;Carry;Lift    Examination-Participation Restrictions Yard Work;Cleaning    Stability/Clinical Decision Making  Stable/Uncomplicated    Rehab Potential Fair    PT Frequency 2x / week    PT Duration 8 weeks    PT Treatment/Interventions Moist Heat;Electrical Stimulation;Patient/family education;Therapeutic exercise;Therapeutic activities    PT Next Visit  Plan consider repeating TDN if it was effective, progress cervical and upper quarter strengthening    PT Home Exercise Plan posture training    Consulted and Agree with Plan of Care Patient           Patient will benefit from skilled therapeutic intervention in order to improve the following deficits and impairments:  Pain, Decreased activity tolerance, Decreased strength, Postural dysfunction, Decreased range of motion, Increased muscle spasms, Impaired flexibility  Visit Diagnosis: Muscle weakness (generalized)  Neck pain     Problem List Patient Active Problem List   Diagnosis Date Noted   Left sided numbness 02/13/2020   Hyperlipidemia 12/07/2019   Radiculopathy, cervical and lumbar 12/06/2019   Abdominal pain, epigastric    Left flank pain 08/15/2019   Elevated LFTs 08/10/2019   Hypogammaglobulinemia ([place]) 05/17/2019   Excessive daytime sleepiness 03/28/2019   Cataplexy 03/28/2019   OSA on CPAP 03/28/2019   Acute pain of left knee 04/18/2018   Cutaneous skin tags 04/18/2018   Lower extremity edema 04/18/2018   Thoracic outlet syndrome 02/19/2018   Pain in limb 02/19/2018   Muscle strain 09/01/2017   Chest pain 03/21/2017   Dyspnea on exertion 02/19/2017   Language difficulty 02/19/2017   BPH with obstruction/lower urinary tract symptoms 12/03/2015   Medication refill 09/13/2015   Erectile disorder, generalized, mild 07/26/2015   Hypogonadism in male 05/24/2015   Environmental allergies 04/13/2015   Sleep apnea 04/13/2015   Migraines 04/13/2015   Neck pain 04/13/2015   Adjustment disorder with mixed anxiety and depressed mood 04/13/2015   Psoriasis 04/13/2015   History of kidney stones  04/13/2015   Neuropathy 04/13/2015   Benign fibroma of prostate 03/24/2015   Hypertension 03/24/2015   Elevation of level of transaminase or lactic acid dehydrogenase (LDH) 03/24/2015   Fatigue 03/24/2015    Alanson Puls, PT DPT 09/13/2020, 10:59 AM  [facility] MAIN [facility] SERVICES [address] [place], [place], [address] Phone: [phone]   Fax:  [phone]  Name: Daniel Calderon MRN: [id number] Date of Birth: Sep 06, 1966

## 2020-09-13 NOTE — Progress Notes (Addendum)
error 

## 2020-09-14 ENCOUNTER — Ambulatory Visit (INDEPENDENT_AMBULATORY_CARE_PROVIDER_SITE_OTHER): Payer: No Typology Code available for payment source | Admitting: Urology

## 2020-09-14 ENCOUNTER — Other Ambulatory Visit: Payer: Self-pay

## 2020-09-14 ENCOUNTER — Encounter: Payer: Self-pay | Admitting: Urology

## 2020-09-14 VITALS — BP 125/79 | HR 99 | Ht 74.0 in | Wt 210.0 lb

## 2020-09-14 DIAGNOSIS — E291 Testicular hypofunction: Secondary | ICD-10-CM

## 2020-09-14 MED ORDER — XYOSTED 75 MG/0.5ML ~~LOC~~ SOAJ: 75.0000 mg | SUBCUTANEOUS | 2 refills | Status: DC

## 2020-09-14 NOTE — Progress Notes (Signed)
09/14/2020 1:32 PM   Daniel Calderon Aug 10, 1966 [id number]  Referring provider: [name], MD [address] [place],  [place] [address] Chief Complaint  Patient presents with   Hypogonadism    HPI: Daniel Calderon is a 54 y.o. male who is seen today for a 1 year follow up of hypogonadism and erectile dysfunction.   -Remains on AndroGel daily.  -Requests looking at other TRT options as concerned regarding transference and applying gel has become cumbersome -Previously on IM injections and his wife does not desire to administer an self-administered IM thigh injections are too painful -He reports good libido and energy. -No bothersome urinary symptoms.   PMH: Past Medical History:  Diagnosis Date   Allergy    Seasonal   Anxiety    Benign enlargement of prostate    Depression    Elevated BP    Elevated transaminase level    Failure of erection    Fatigue    Frequent headaches    GERD (gastroesophageal reflux disease)    History of kidney stones    Hypertension    Hypogonadism in male    Migraine    Sinus congestion 09/01/2017   Sleep apnea    Currently uses cpap   Thoracic outlet syndrome    Left Arm   Tongue pain 02/09/2019    Surgical History: Past Surgical History:  Procedure Laterality Date   COLONOSCOPY WITH PROPOFOL N/A 11/11/2017   Procedure: COLONOSCOPY WITH PROPOFOL;  Surgeon: Lin Landsman, MD;  Location: [place];  Service: Endoscopy;  Laterality: N/A;   ESOPHAGOGASTRODUODENOSCOPY (EGD) WITH PROPOFOL N/A 08/25/2019   Procedure: ESOPHAGOGASTRODUODENOSCOPY (EGD) WITH PROPOFOL;  Surgeon: Lin Landsman, MD;  Location: [place];  Service: Gastroenterology;  Laterality: N/A;   KNEE ARTHROSCOPY WITH MEDIAL MENISECTOMY Left 07/26/2018   Procedure: KNEE ARTHROSCOPY WITH PARTIAL MEDIAL MENISECTOMY;  Surgeon: Leim Fabry, MD;  Location: [facility];  Service: Orthopedics;  Laterality: Left;    Left Shoulder Surgery  2011   nerve block     2 in neck. 5 in back.   NOSE SURGERY     POLYPECTOMY  11/11/2017   Procedure: POLYPECTOMY;  Surgeon: Lin Landsman, MD;  Location: [place];  Service: Endoscopy;;   SCALENE NODE BIOPSY / EXCISION     VASECTOMY      Home Medications:  Allergies as of 09/14/2020      Reactions   Erythromycin Nausea And Vomiting   Cephalexin Other (See Comments)   Unknown   Dexamethasone Other (See Comments)   Unknown   Oxycodone-acetaminophen Other (See Comments)   Unknown   Prednisone Other (See Comments)   Unknown   Gabapentin Other (See Comments)   Aggressive   Ibuprofen Itching, Other (See Comments)   Can take in low doses per pt      Medication List       Accurate as of September 14, 2020  1:32 PM. If you have any questions, ask your nurse or doctor.        amLODipine 10 MG tablet Commonly known as: NORVASC TAKE 1 TABLET BY MOUTH DAILY.   atorvastatin 40 MG tablet Commonly known as: LIPITOR Take 1 tablet (40 mg total) by mouth daily at 6 PM.   benzonatate 200 MG capsule Commonly known as: TESSALON Take 1 capsule (200 mg total) by mouth 3 (three) times daily as needed for cough.   chlorpheniramine-HYDROcodone 10-8 MG/5ML Suer Commonly known as: Tussionex Pennkinetic ER Take 5  mLs by mouth every 12 (twelve) hours as needed for cough.   clonazePAM 0.5 MG tablet Commonly known as: KLONOPIN Take 0.5 mg by mouth 2 (two) times daily as needed.   cyanocobalamin 1000 MCG/ML injection Commonly known as: (VITAMIN B-12) Inject 1,000 mcg into the muscle every 30 (thirty) days.   diclofenac 75 MG EC tablet Commonly known as: VOLTAREN Take 75 mg by mouth 2 (two) times daily.   divalproex 250 MG DR tablet Commonly known as: DEPAKOTE Take 250 mg by mouth 2 (two) times daily.   ibuprofen 600 MG tablet Commonly known as: ADVIL Take 1 tablet (600 mg total) by mouth every 6 (six) hours as needed.   multivitamin  tablet Take 1 tablet by mouth daily.   nortriptyline 10 MG capsule Commonly known as: PAMELOR Take 10 mg by mouth at bedtime.   omeprazole 20 MG capsule Commonly known as: PRILOSEC TAKE 1 CAPSULE (20 MG TOTAL) BY MOUTH DAILY.   sertraline 100 MG tablet Commonly known as: ZOLOFT TAKE 1 TABLET BY MOUTH ONCE DAILY.   testosterone 50 MG/5GM (1%) Gel Commonly known as: ANDROGEL Place 5 g onto the skin daily.       Allergies:  Allergies  Allergen Reactions   Erythromycin Nausea And Vomiting   Cephalexin Other (See Comments)    Unknown   Dexamethasone Other (See Comments)    Unknown   Oxycodone-Acetaminophen Other (See Comments)    Unknown   Prednisone Other (See Comments)    Unknown   Gabapentin Other (See Comments)    Aggressive   Ibuprofen Itching and Other (See Comments)    Can take in low doses per pt    Family History: Family History  Problem Relation Age of Onset   Hypertension Mother        Living   Hearing loss Mother    Heart disease Other    Healthy Father        Living   Diabetes Brother    Stroke Paternal Uncle    Heart attack Paternal Uncle    Hearing loss Maternal Grandmother    Healthy Son    Healthy Daughter    Kidney cancer Neg Hx    Bladder Cancer Neg Hx    Prostate cancer Neg Hx     Social History:  reports that he has never smoked. He has never used smokeless tobacco. He reports current alcohol use of about 3.0 standard drinks of alcohol per week. He reports that he does not use drugs.   Physical Exam: BP 125/79    Pulse 99    Ht 6\' 2"  (1.88 m)    Wt 210 lb (95.3 kg)    BMI 26.96 kg/m   Constitutional:  Alert and oriented, No acute distress. HEENT: [place] AT, moist mucus membranes.  Trachea midline, no masses. Cardiovascular: No clubbing, cyanosis, or edema. Respiratory: Normal respiratory effort, no increased work of breathing. Skin: No rashes, bruises or suspicious lesions. Neurologic: Grossly intact, no focal  deficits, moving all 4 extremities. Psychiatric: Normal mood and affect.    Assessment & Plan:    1. Hypogonadism Stable symptoms on TRT. Labs from 02/2020 showed testosterone 234 ng/dL and hematocrit 42.4.  Discussed alternative options including Xyosted and SunGard.   Patient is most interested in [place] and Rx sent to pharmacy though will most likely need prior authorization PSA, testosterone and hematocrit drawn today.   40-month lab visit for testosterone/hematocrit 1 year follow-up  [place] [address], [address]  [place], [place] [address] [phone]  I, Selena Batten, am acting as a scribe for Daniel Calderon,  I have reviewed the above documentation for accuracy and completeness, and I agree with the above.    Abbie Sons, MD

## 2020-09-15 LAB — TESTOSTERONE: Testosterone: 186 ng/dL — ABNORMAL LOW (ref 264–916)

## 2020-09-15 LAB — HEMATOCRIT: Hematocrit: 42.2 % (ref 37.5–51.0)

## 2020-09-15 LAB — PSA: Prostate Specific Ag, Serum: 0.2 ng/mL (ref 0.0–4.0)

## 2020-09-18 ENCOUNTER — Other Ambulatory Visit: Payer: Self-pay | Admitting: Neurology

## 2020-09-18 ENCOUNTER — Ambulatory Visit: Payer: No Typology Code available for payment source

## 2020-09-18 ENCOUNTER — Other Ambulatory Visit: Payer: Self-pay

## 2020-09-18 DIAGNOSIS — M542 Cervicalgia: Secondary | ICD-10-CM

## 2020-09-18 DIAGNOSIS — M6281 Muscle weakness (generalized): Secondary | ICD-10-CM

## 2020-09-18 NOTE — Therapy (Signed)
[place] MAIN [facility] SERVICES [address] [place], [place], [address] Phone: [phone]   Fax:  [phone]  Physical Therapy Treatment  Patient Details  Name: Daniel Calderon MRN: [id number] Date of Birth: 17-Aug-1966 Referring Provider (PT): Gurney Maxin   Encounter Date: 09/18/2020   PT End of Session - 09/18/20 1254    Visit Number 7    Number of Visits 17    Date for PT Re-Evaluation 10/16/20    PT Start Time 1300    PT Stop Time 1331    PT Time Calculation (min) 31 min    Activity Tolerance Patient tolerated treatment well;No increased pain    Behavior During Therapy WFL for tasks assessed/performed           Past Medical History:  Diagnosis Date   Allergy    Seasonal   Anxiety    Benign enlargement of prostate    Depression    Elevated BP    Elevated transaminase level    Failure of erection    Fatigue    Frequent headaches    GERD (gastroesophageal reflux disease)    History of kidney stones    Hypertension    Hypogonadism in male    Migraine    Sinus congestion 09/01/2017   Sleep apnea    Currently uses cpap   Thoracic outlet syndrome    Left Arm   Tongue pain 02/09/2019    Past Surgical History:  Procedure Laterality Date   COLONOSCOPY WITH PROPOFOL N/A 11/11/2017   Procedure: COLONOSCOPY WITH PROPOFOL;  Surgeon: Lin Landsman, MD;  Location: [place];  Service: Endoscopy;  Laterality: N/A;   ESOPHAGOGASTRODUODENOSCOPY (EGD) WITH PROPOFOL N/A 08/25/2019   Procedure: ESOPHAGOGASTRODUODENOSCOPY (EGD) WITH PROPOFOL;  Surgeon: Lin Landsman, MD;  Location: [place];  Service: Gastroenterology;  Laterality: N/A;   KNEE ARTHROSCOPY WITH MEDIAL MENISECTOMY Left 07/26/2018   Procedure: KNEE ARTHROSCOPY WITH PARTIAL MEDIAL MENISECTOMY;  Surgeon: Leim Fabry, MD;  Location: [facility];  Service: Orthopedics;  Laterality: Left;   Left Shoulder Surgery  2011   nerve  block     2 in neck. 5 in back.   NOSE SURGERY     POLYPECTOMY  11/11/2017   Procedure: POLYPECTOMY;  Surgeon: Lin Landsman, MD;  Location: [place];  Service: Endoscopy;;   SCALENE NODE BIOPSY / EXCISION     VASECTOMY      There were no vitals filed for this visit.   Subjective Assessment - 09/18/20 1634    Subjective Patient reports the tape really helped last session. Went and saw his neurologist and will have an increase in dosage of medication. Had radiating pain to forearm that improved.    Pertinent History Patient states he is having constant left sided neck pain. Describes pain as 1/10.Continues to have left sided numbness. Cervical spine spondylosis most notable at C6-7 with a right lateralrecess disc protrusion with an annular fissure which contacts andimpinges the right C7 nerve root. There is also moderate to severebilateral neural foraminal narrowing and mild central canalStenosis.    Currently in Pain? Yes    Pain Score 4     Pain Location Neck    Pain Orientation Left    Pain Descriptors / Indicators Aching;Stabbing    Pain Onset More than a month ago    Pain Frequency Constant               seated scap retraction/depression with overpressure into neutral  alignment LUE 4x 15 seconds Scapular retraction with chin tuck and min A to retain scap positioning 10x  Supine:  AC inferior glide mobilizations grade II, 10x each joint ,  scap retraction into table: stabilization hold 30 seconds: reduction of pulse -terminated intervention -first rib mobilization L; 2x 15 seconds -lateral mobilizations cervical glides bilateral, pain relieving    Prone: Inferior and inferomedial L Rib mobilization with glenohumeral elevation/retraction for optimal alignment/neutral alignment. X 6 minutes   J mobilization inferior 4x20 seconds with patient reporting relief of symptoms.  J mob with L shoulder extension AROM LUE; 10x, patient demonstrates improved  scapular movement with decreased dyskinesia   seated: Taping K tape to bilateral shoulders/scapula into scapular depression and retraction, additional tape to inferior angle of L scapula for alignment pull.   Education on tape wear with patient verbalizing understanding.   Patient reports reduction of neurological and pain symptoms by end of session. Manual with subsequent therex for strengthening in corrected alignment was beneficial to patient this session. Left scapular dyskinesia is apparent and improved positioning of scapula reduced symptoms throughout head and body. Patient will continue to benefit from skilled PT to improve mobility and decrease pain to cervical area.              PT Education - 09/18/20 1254    Education Details exercise technique, manual, taping    Person(s) Educated Patient    Methods Explanation;Demonstration;Tactile cues;Verbal cues    Comprehension Verbalized understanding;Returned demonstration;Verbal cues required;Tactile cues required            PT Short Term Goals - 08/21/20 1425      PT SHORT TERM GOAL #1   Title Patient will improve FOTO score to indicate improved functional mobility    Time 4    Period Weeks    Status New    Target Date 09/18/20             PT Long Term Goals - 08/21/20 1426      PT LONG TERM GOAL #1   Title Patient will be independent in home exercise program to improve strength/mobility for better functional independence with ADLs.    Time 8    Period Weeks    Status New    Target Date 10/16/20      PT LONG TERM GOAL #2   Title Patient will decreased NDI score 8points to demonstrate better functional mobility    Time 8    Period Weeks    Status New    Target Date 10/16/20      PT LONG TERM GOAL #3   Title Patient will be able to perform household work/ chores without increase in symptoms.    Time 8    Period Weeks    Status New    Target Date 10/16/20      PT LONG TERM GOAL #4   Title Patient  will report a worst pain of 0/10 on VAS in neck  to improve tolerance with ADLs and reduced symptoms with activities.    Time 8    Period Weeks    Status New    Target Date 10/16/20                 Plan - 09/18/20 1637    Clinical Impression Statement Patient reports reduction of neurological and pain symptoms by end of session. Manual with subsequent therex for strengthening in corrected alignment was beneficial to patient this session. Left scapular dyskinesia is apparent and  improved positioning of scapula reduced symptoms throughout head and body. Patient will continue to benefit from skilled PT to improve mobility and decrease pain to cervical area.    Examination-Activity Limitations Reach Overhead;Carry;Lift    Examination-Participation Restrictions Yard Work;Cleaning    Stability/Clinical Decision Making Stable/Uncomplicated    Rehab Potential Fair    PT Frequency 2x / week    PT Duration 8 weeks    PT Treatment/Interventions Moist Heat;Electrical Stimulation;Patient/family education;Therapeutic exercise;Therapeutic activities    PT Next Visit Plan consider repeating TDN if it was effective, progress cervical and upper quarter strengthening    PT Home Exercise Plan posture training    Consulted and Agree with Plan of Care Patient           Patient will benefit from skilled therapeutic intervention in order to improve the following deficits and impairments:  Pain, Decreased activity tolerance, Decreased strength, Postural dysfunction, Decreased range of motion, Increased muscle spasms, Impaired flexibility  Visit Diagnosis: Muscle weakness (generalized)  Neck pain     Problem List Patient Active Problem List   Diagnosis Date Noted   Left sided numbness 02/13/2020   Hyperlipidemia 12/07/2019   Radiculopathy, cervical and lumbar 12/06/2019   Abdominal pain, epigastric    Left flank pain 08/15/2019   Elevated LFTs 08/10/2019   Hypogammaglobulinemia ([place])  05/17/2019   Excessive daytime sleepiness 03/28/2019   Cataplexy 03/28/2019   OSA on CPAP 03/28/2019   Acute pain of left knee 04/18/2018   Cutaneous skin tags 04/18/2018   Lower extremity edema 04/18/2018   Thoracic outlet syndrome 02/19/2018   Pain in limb 02/19/2018   Muscle strain 09/01/2017   Chest pain 03/21/2017   Dyspnea on exertion 02/19/2017   Language difficulty 02/19/2017   BPH with obstruction/lower urinary tract symptoms 12/03/2015   Medication refill 09/13/2015   Erectile disorder, generalized, mild 07/26/2015   Hypogonadism in male 05/24/2015   Environmental allergies 04/13/2015   Sleep apnea 04/13/2015   Migraines 04/13/2015   Neck pain 04/13/2015   Adjustment disorder with mixed anxiety and depressed mood 04/13/2015   Psoriasis 04/13/2015   History of kidney stones 04/13/2015   Neuropathy 04/13/2015   Benign fibroma of prostate 03/24/2015   Hypertension 03/24/2015   Elevation of level of transaminase or lactic acid dehydrogenase (LDH) 03/24/2015   Fatigue 03/24/2015    Janna Arch, PT, DPT   09/18/2020, 4:38 PM  [place] [facility] MAIN [facility] SERVICES [address] [place], [place], [address] Phone: [phone]   Fax:  [phone]  Name: GRIGOR LIPSCHUTZ MRN: [id number] Date of Birth: February 26, 1966

## 2020-09-20 ENCOUNTER — Ambulatory Visit: Payer: No Typology Code available for payment source

## 2020-09-20 ENCOUNTER — Other Ambulatory Visit: Payer: Self-pay

## 2020-09-20 DIAGNOSIS — M6281 Muscle weakness (generalized): Secondary | ICD-10-CM

## 2020-09-20 DIAGNOSIS — M542 Cervicalgia: Secondary | ICD-10-CM | POA: Diagnosis not present

## 2020-09-20 NOTE — Therapy (Signed)
[place] MAIN [facility] SERVICES [address] [place], [place], [address] Phone: [phone]   Fax:  [phone]  Physical Therapy Treatment  Patient Details  Name: Daniel Calderon MRN: [id number] Date of Birth: October 07, 1966 Referring Provider (PT): Gurney Maxin   Encounter Date: 09/20/2020   PT End of Session - 09/20/20 1437    Visit Number 8    Number of Visits 17    Date for PT Re-Evaluation 10/16/20    PT Start Time 7062    PT Stop Time 1418    PT Time Calculation (min) 32 min    Activity Tolerance Patient tolerated treatment well;No increased pain    Behavior During Therapy WFL for tasks assessed/performed           Past Medical History:  Diagnosis Date  . Allergy    Seasonal  . Anxiety   . Benign enlargement of prostate   . Depression   . Elevated BP   . Elevated transaminase level   . Failure of erection   . Fatigue   . Frequent headaches   . GERD (gastroesophageal reflux disease)   . History of kidney stones   . Hypertension   . Hypogonadism in male   . Migraine   . Sinus congestion 09/01/2017  . Sleep apnea    Currently uses cpap  . Thoracic outlet syndrome    Left Arm  . Tongue pain 02/09/2019    Past Surgical History:  Procedure Laterality Date  . COLONOSCOPY WITH PROPOFOL N/A 11/11/2017   Procedure: COLONOSCOPY WITH PROPOFOL;  Surgeon: Lin Landsman, MD;  Location: [place];  Service: Endoscopy;  Laterality: N/A;  . ESOPHAGOGASTRODUODENOSCOPY (EGD) WITH PROPOFOL N/A 08/25/2019   Procedure: ESOPHAGOGASTRODUODENOSCOPY (EGD) WITH PROPOFOL;  Surgeon: Lin Landsman, MD;  Location: [facility] END[facility]COPY;  Service: Gastroenterology;  Laterality: N/A;  . KNEE ARTHROSCOPY WITH MEDIAL MENISECTOMY Left 07/26/2018   Procedure: KNEE ARTHROSCOPY WITH PARTIAL MEDIAL MENISECTOMY;  Surgeon: Leim Fabry, MD;  Location: [facility];  Service: Orthopedics;  Laterality: Left;  . Left Shoulder Surgery  2011  . nerve  block     2 in neck. 5 in back.  . NOSE SURGERY    . POLYPECTOMY  11/11/2017   Procedure: POLYPECTOMY;  Surgeon: Lin Landsman, MD;  Location: [place];  Service: Endoscopy;;  . SCALENE NODE BIOPSY / EXCISION    . VASECTOMY      There were no vitals filed for this visit.   Subjective Assessment - 09/20/20 1436    Subjective Patient reports the tape and treatment session lasted for ~ 1 day of relief of symptoms. Is happy with reduction of symptoms.    Pertinent History Patient states he is having constant left sided neck pain. Describes pain as 1/10.Continues to have left sided numbness. Cervical spine spondylosis most notable at C6-7 with a right lateralrecess disc protrusion with an annular fissure which contacts andimpinges the right C7 nerve root. There is also moderate to severebilateral neural foraminal narrowing and mild central canalStenosis.    Currently in Pain? Yes    Pain Score 3     Pain Location Neck    Pain Orientation Left    Pain Descriptors / Indicators Aching    Pain Type Chronic pain    Pain Onset More than a month ago    Pain Frequency Constant              seated scap retraction/depression with overpressure into neutral alignment  LUE 4x 15 seconds Scapular retraction with chin tuck and min A to retain scap positioning 10x   Standing: Wall posture with chin tuck and scapular retraction (added to HEP ) x 4 trials of 15 seconds  Supine:  AC inferior glide mobilizations grade II, 10x each joint ,  scap retraction into table: stabilization hold 30 seconds: reduction of pulse -terminated intervention first rib mobilization L; 2x 15 seconds lateral mobilizations cervical glides bilateral, pain relieving  AP glenohumeral L grade II, 3x10 seconds Median nerve glide LUE Side bend with overpressure at occiput and glenohumeral joint 2x 30 seconds Cervical rotation with overpressure at occiput and glenohumeral joint 2x 30 seconds  suboccipital  release 2x 30 seconds (added to HEP with tennis balls )  Prone: Inferior and inferomedial L Rib mobilization with glenohumeral elevation/retraction for optimal alignment/neutral alignment. X 6 minutes   J mobilization inferior 4x20 seconds with patient reporting relief of symptoms.  J mob with L shoulder extension AROM LUE; 10x, patient demonstrates improved scapular movement with decreased dyskinesia    seated: Taping K tape to bilateral shoulders/scapula into scapular depression and retraction, additional tape to inferior angle of L scapula for alignment pull.    Education on tape wear with patient verbalizing understanding.            Pt educated throughout session about proper posture and technique with exercises. Improved exercise technique, movement at target joints, use of target muscles after min to mod verbal, visual, tactile cues.                      PT Education - 09/20/20 1437    Education Details exercise technique, manual, taping, posture    Person(s) Educated Patient    Methods Explanation;Demonstration;Tactile cues;Verbal cues    Comprehension Verbalized understanding;Returned demonstration;Verbal cues required;Tactile cues required            PT Short Term Goals - 08/21/20 1425      PT SHORT TERM GOAL #1   Title Patient will improve FOTO score to indicate improved functional mobility    Time 4    Period Weeks    Status New    Target Date 09/18/20             PT Long Term Goals - 08/21/20 1426      PT LONG TERM GOAL #1   Title Patient will be independent in home exercise program to improve strength/mobility for better functional independence with ADLs.    Time 8    Period Weeks    Status New    Target Date 10/16/20      PT LONG TERM GOAL #2   Title Patient will decreased NDI score 8points to demonstrate better functional mobility    Time 8    Period Weeks    Status New    Target Date 10/16/20      PT LONG TERM GOAL #3    Title Patient will be able to perform household work/ chores without increase in symptoms.    Time 8    Period Weeks    Status New    Target Date 10/16/20      PT LONG TERM GOAL #4   Title Patient will report a worst pain of 0/10 on VAS in neck  to improve tolerance with ADLs and reduced symptoms with activities.    Time 8    Period Weeks    Status New    Target Date 10/16/20  Plan - 09/20/20 1438    Clinical Impression Statement Patient demonstrates excellent progression towards functional postural correction and pain relief. Patient is challenged with maintaining alignment for prolonged times indicating weakened stabilizing musculature with home program of wall positioning for re-alignment given and patient demonstrating understanding  Patient will continue to benefit from skilled PT to improve mobility and decrease pain to cervical area.    Examination-Activity Limitations Reach Overhead;Carry;Lift    Examination-Participation Restrictions Yard Work;Cleaning    Stability/Clinical Decision Making Stable/Uncomplicated    Rehab Potential Fair    PT Frequency 2x / week    PT Duration 8 weeks    PT Treatment/Interventions Moist Heat;Electrical Stimulation;Patient/family education;Therapeutic exercise;Therapeutic activities    PT Next Visit Plan consider repeating TDN if it was effective, progress cervical and upper quarter strengthening    PT Home Exercise Plan posture training    Consulted and Agree with Plan of Care Patient           Patient will benefit from skilled therapeutic intervention in order to improve the following deficits and impairments:  Pain, Decreased activity tolerance, Decreased strength, Postural dysfunction, Decreased range of motion, Increased muscle spasms, Impaired flexibility  Visit Diagnosis: Muscle weakness (generalized)  Neck pain     Problem List Patient Active Problem List   Diagnosis Date Noted  . Left sided numbness  02/13/2020  . Hyperlipidemia 12/07/2019  . Radiculopathy, cervical and lumbar 12/06/2019  . Abdominal pain, epigastric   . Left flank pain 08/15/2019  . Elevated LFTs 08/10/2019  . Hypogammaglobulinemia ([place]) 05/17/2019  . Excessive daytime sleepiness 03/28/2019  . Cataplexy 03/28/2019  . OSA on CPAP 03/28/2019  . Acute pain of left knee 04/18/2018  . Cutaneous skin tags 04/18/2018  . Lower extremity edema 04/18/2018  . Thoracic outlet syndrome 02/19/2018  . Pain in limb 02/19/2018  . Muscle strain 09/01/2017  . Chest pain 03/21/2017  . Dyspnea on exertion 02/19/2017  . Language difficulty 02/19/2017  . BPH with obstruction/lower urinary tract symptoms 12/03/2015  . Medication refill 09/13/2015  . Erectile disorder, generalized, mild 07/26/2015  . Hypogonadism in male 05/24/2015  . Environmental allergies 04/13/2015  . Sleep apnea 04/13/2015  . Migraines 04/13/2015  . Neck pain 04/13/2015  . Adjustment disorder with mixed anxiety and depressed mood 04/13/2015  . Psoriasis 04/13/2015  . History of kidney stones 04/13/2015  . Neuropathy 04/13/2015  . Benign fibroma of prostate 03/24/2015  . Hypertension 03/24/2015  . Elevation of level of transaminase or lactic acid dehydrogenase (LDH) 03/24/2015  . Fatigue 03/24/2015   Janna Arch, PT, DPT   09/20/2020, 2:40 PM  [place] MAIN [facility] SERVICES [address] [place], [place], [address] Phone: [phone]   Fax:  [phone]  Name: Daniel Calderon MRN: [id number] Date of Birth: 1966/04/08

## 2020-09-26 ENCOUNTER — Telehealth: Payer: Self-pay | Admitting: *Deleted

## 2020-09-26 NOTE — Telephone Encounter (Signed)
-----   Message from Abbie Sons, MD sent at 09/26/2020  7:30 AM EDT ----- Testosterone level is 186.  PSA stable 0.2 and hematocrit was normal.  I had sent an Rx Xyosted last visit but do not know if it has been approved by insurance

## 2020-09-28 ENCOUNTER — Ambulatory Visit: Payer: No Typology Code available for payment source | Attending: Neurology

## 2020-09-28 ENCOUNTER — Other Ambulatory Visit: Payer: Self-pay

## 2020-09-28 DIAGNOSIS — M6281 Muscle weakness (generalized): Secondary | ICD-10-CM | POA: Diagnosis not present

## 2020-09-28 DIAGNOSIS — M542 Cervicalgia: Secondary | ICD-10-CM | POA: Diagnosis present

## 2020-09-28 NOTE — Telephone Encounter (Signed)
Notified patient as instructed, patient pleased. Discussed follow-up appointments, patient agrees  

## 2020-09-28 NOTE — Therapy (Signed)
[place] MAIN [facility] SERVICES [address] [place], [place], [address] Phone: [phone]   Fax:  [phone]  Physical Therapy Treatment  Patient Details  Name: Daniel Calderon MRN: [id number] Date of Birth: 1966-03-28 Referring Provider (PT): Gurney Maxin   Encounter Date: 09/28/2020   PT End of Session - 09/28/20 0836    Visit Number 9    Number of Visits 17    Date for PT Re-Evaluation 10/16/20    PT Start Time 0800    PT Stop Time 0826    PT Time Calculation (min) 26 min    Activity Tolerance Patient tolerated treatment well;No increased pain    Behavior During Therapy WFL for tasks assessed/performed           Past Medical History:  Diagnosis Date  . Allergy    Seasonal  . Anxiety   . Benign enlargement of prostate   . Depression   . Elevated BP   . Elevated transaminase level   . Failure of erection   . Fatigue   . Frequent headaches   . GERD (gastroesophageal reflux disease)   . History of kidney stones   . Hypertension   . Hypogonadism in male   . Migraine   . Sinus congestion 09/01/2017  . Sleep apnea    Currently uses cpap  . Thoracic outlet syndrome    Left Arm  . Tongue pain 02/09/2019    Past Surgical History:  Procedure Laterality Date  . COLONOSCOPY WITH PROPOFOL N/A 11/11/2017   Procedure: COLONOSCOPY WITH PROPOFOL;  Surgeon: Lin Landsman, MD;  Location: [place];  Service: Endoscopy;  Laterality: N/A;  . ESOPHAGOGASTRODUODENOSCOPY (EGD) WITH PROPOFOL N/A 08/25/2019   Procedure: ESOPHAGOGASTRODUODENOSCOPY (EGD) WITH PROPOFOL;  Surgeon: Lin Landsman, MD;  Location: [facility] ENDOSCOPY;  Service: Gastroenterology;  Laterality: N/A;  . KNEE ARTHROSCOPY WITH MEDIAL MENISECTOMY Left 07/26/2018   Procedure: KNEE ARTHROSCOPY WITH PARTIAL MEDIAL MENISECTOMY;  Surgeon: Leim Fabry, MD;  Location: [facility];  Service: Orthopedics;  Laterality: Left;  . Left Shoulder Surgery  2011  . nerve  block     2 in neck. 5 in back.  . NOSE SURGERY    . POLYPECTOMY  11/11/2017   Procedure: POLYPECTOMY;  Surgeon: Lin Landsman, MD;  Location: [place];  Service: Endoscopy;;  . SCALENE NODE BIOPSY / EXCISION    . VASECTOMY      There were no vitals filed for this visit.   Subjective Assessment - 09/28/20 0828    Subjective Patient saw physician since last seen by this clinician. WIll be holding PT to attempt cortisone injection next week for reduction of symptoms.    Pertinent History Patient states he is having constant left sided neck pain. Describes pain as 1/10.Continues to have left sided numbness. Cervical spine spondylosis most notable at C6-7 with a right lateralrecess disc protrusion with an annular fissure which contacts andimpinges the right C7 nerve root. There is also moderate to severebilateral neural foraminal narrowing and mild central canalStenosis.    Currently in Pain? Yes    Pain Score 2     Pain Location Neck    Pain Orientation Left    Pain Descriptors / Indicators Aching    Pain Type Chronic pain    Pain Onset More than a month ago                      Supine:  AC inferior glide  mobilizations grade II, 10x each joint ,  scap retraction into table: stabilization hold 30 seconds: reduction of pulse -terminated intervention first rib mobilization L; 2x 15 seconds lateral mobilizations cervical glides bilateral, pain relieving  AP glenohumeral L grade II, 3x10 seconds Median nerve glide LUE 10x  Side bend with overpressure at occiput and glenohumeral joint 2x 30 seconds Cervical rotation with overpressure at occiput and glenohumeral joint 2x 30 seconds  suboccipital release 2x 30 seconds (added to HEP with tennis balls )   Prone: Inferior and inferomedial L Rib mobilization with glenohumeral elevation/retraction for optimal alignment/neutral alignment. X 6 minutes   J mobilization inferior 4x20 seconds with patient reporting relief  of symptoms.  J mob with L shoulder extension AROM LUE; 10x, patient demonstrates improved scapular movement with decreased dyskinesia    seated: Taping K tape to bilateral shoulders/scapula into scapular depression and retraction, additional tape to inferior angle of L scapula for alignment pull.    Education on tape wear with patient verbalizing understanding.          Pt educated throughout session about proper posture and technique with exercises. Improved exercise technique, movement at target joints, use of target muscles after min to mod verbal, visual, tactile cues.                PT Education - 09/28/20 0835    Education Details exercise technique, body mechanics, posture    Person(s) Educated Patient    Methods Explanation;Demonstration;Tactile cues;Verbal cues    Comprehension Verbalized understanding;Returned demonstration;Verbal cues required;Tactile cues required            PT Short Term Goals - 08/21/20 1425      PT SHORT TERM GOAL #1   Title Patient will improve FOTO score to indicate improved functional mobility    Time 4    Period Weeks    Status New    Target Date 09/18/20             PT Long Term Goals - 08/21/20 1426      PT LONG TERM GOAL #1   Title Patient will be independent in home exercise program to improve strength/mobility for better functional independence with ADLs.    Time 8    Period Weeks    Status New    Target Date 10/16/20      PT LONG TERM GOAL #2   Title Patient will decreased NDI score 8points to demonstrate better functional mobility    Time 8    Period Weeks    Status New    Target Date 10/16/20      PT LONG TERM GOAL #3   Title Patient will be able to perform household work/ chores without increase in symptoms.    Time 8    Period Weeks    Status New    Target Date 10/16/20      PT LONG TERM GOAL #4   Title Patient will report a worst pain of 0/10 on VAS in neck  to improve tolerance with ADLs and  reduced symptoms with activities.    Time 8    Period Weeks    Status New    Target Date 10/16/20                 Plan - 09/28/20 3790    Clinical Impression Statement Patient reports he saw the physician the other day and is advised that today will be his last PT session at this time and will  get an injection on Monday for his nerve impingement. Will not discharge patient at this time due to potential for patient to return to PT after injection if injection is not successful. I will be happy to continue to treat this patient as needed as manual results in improved symptoms with decreased neurological radiating pain. Patient will continue to benefit from skilled PT to improve mobility and decrease pain to cervical area.    Examination-Activity Limitations Reach Overhead;Carry;Lift    Examination-Participation Restrictions Yard Work;Cleaning    Stability/Clinical Decision Making Stable/Uncomplicated    Rehab Potential Fair    PT Frequency 2x / week    PT Duration 8 weeks    PT Treatment/Interventions Moist Heat;Electrical Stimulation;Patient/family education;Therapeutic exercise;Therapeutic activities    PT Next Visit Plan consider repeating TDN if it was effective, progress cervical and upper quarter strengthening    PT Home Exercise Plan posture training    Consulted and Agree with Plan of Care Patient           Patient will benefit from skilled therapeutic intervention in order to improve the following deficits and impairments:  Pain, Decreased activity tolerance, Decreased strength, Postural dysfunction, Decreased range of motion, Increased muscle spasms, Impaired flexibility  Visit Diagnosis: Muscle weakness (generalized)  Neck pain     Problem List Patient Active Problem List   Diagnosis Date Noted  . Left sided numbness 02/13/2020  . Hyperlipidemia 12/07/2019  . Radiculopathy, cervical and lumbar 12/06/2019  . Abdominal pain, epigastric   . Left flank pain  08/15/2019  . Elevated LFTs 08/10/2019  . Hypogammaglobulinemia ([place]) 05/17/2019  . Excessive daytime sleepiness 03/28/2019  . Cataplexy 03/28/2019  . OSA on CPAP 03/28/2019  . Acute pain of left knee 04/18/2018  . Cutaneous skin tags 04/18/2018  . Lower extremity edema 04/18/2018  . Thoracic outlet syndrome 02/19/2018  . Pain in limb 02/19/2018  . Muscle strain 09/01/2017  . Chest pain 03/21/2017  . Dyspnea on exertion 02/19/2017  . Language difficulty 02/19/2017  . BPH with obstruction/lower urinary tract symptoms 12/03/2015  . Medication refill 09/13/2015  . Erectile disorder, generalized, mild 07/26/2015  . Hypogonadism in male 05/24/2015  . Environmental allergies 04/13/2015  . Sleep apnea 04/13/2015  . Migraines 04/13/2015  . Neck pain 04/13/2015  . Adjustment disorder with mixed anxiety and depressed mood 04/13/2015  . Psoriasis 04/13/2015  . History of kidney stones 04/13/2015  . Neuropathy 04/13/2015  . Benign fibroma of prostate 03/24/2015  . Hypertension 03/24/2015  . Elevation of level of transaminase or lactic acid dehydrogenase (LDH) 03/24/2015  . Fatigue 03/24/2015   Janna Arch, PT, DPT   09/28/2020, 8:39 AM  [place] MAIN [facility] SERVICES [address] [place], [place], [address] Phone: [phone]   Fax:  [phone]  Name: JERRON NIBLACK MRN: [id number] Date of Birth: 06-Jul-1966

## 2020-10-02 ENCOUNTER — Telehealth: Payer: Self-pay | Admitting: *Deleted

## 2020-10-02 NOTE — Telephone Encounter (Signed)
Patient insurance will not cover Daniel Calderon , he needs to try Testosterone cypionte or enathate

## 2020-10-05 ENCOUNTER — Other Ambulatory Visit: Payer: Self-pay | Admitting: Urology

## 2020-10-05 MED ORDER — TESTOSTERONE CYPIONATE 200 MG/ML IM SOLN
200.0000 mg | INTRAMUSCULAR | 0 refills | Status: DC
Start: 2020-10-05 — End: 2020-10-05

## 2020-10-05 NOTE — Addendum Note (Signed)
Addended by: Abbie Sons on: 10/05/2020 03:48 PM   Modules accepted: Orders

## 2020-10-05 NOTE — Telephone Encounter (Signed)
Left message for patient

## 2020-10-05 NOTE — Telephone Encounter (Signed)
Rx was sent.  I did electronically since he does not need today.  Does he need an appointment for injection training?  He will also need a lab visit for a midcycle testosterone level 6 weeks after starting injections

## 2020-10-05 NOTE — Telephone Encounter (Signed)
Spoke with patient, He would prefer intramuscular injections

## 2020-10-12 NOTE — Telephone Encounter (Signed)
Patient called and left a message on the triage line that he is returning a call from our office.  He can be reached today at [phone].

## 2020-10-12 NOTE — Telephone Encounter (Signed)
Patient will call when he pick up medication . He will need to be schedule for a testosterone teaching .

## 2020-10-16 ENCOUNTER — Other Ambulatory Visit: Payer: Self-pay | Admitting: Physical Medicine & Rehabilitation

## 2020-10-23 ENCOUNTER — Telehealth: Payer: Self-pay | Admitting: Urology

## 2020-10-23 NOTE — Telephone Encounter (Signed)
Pt said his RX got sent through twice and got denied both times at [facility].

## 2020-10-23 NOTE — Telephone Encounter (Signed)
Talked with [facility] and they never put the new one in. They did today and I started Prior auth

## 2020-11-07 ENCOUNTER — Other Ambulatory Visit: Payer: Self-pay | Admitting: Physical Medicine & Rehabilitation

## 2020-11-08 ENCOUNTER — Telehealth: Payer: Self-pay | Admitting: Family Medicine

## 2020-11-08 NOTE — Telephone Encounter (Signed)
Patient notified Testosterone Cyp has been approved. Approval 11/07/2020-11/06/2021 with a max of 12 refills Reference number: [id number]

## 2020-11-12 NOTE — Progress Notes (Signed)
Daniel Calderon is a 54 y.o.  male with testosterone deficiency who presents today for instruction on delivering an IM injection of testosterone cypionate.    I instructed the patient to identify the concentration of his testosterone. Testosterone for injection is usually in the form of testosterone cypionate. These liquids come in multiple concentrations, so before giving an injection, it's very important to make sure that his intended dosage takes into account the concentration of the testosterone serum. Usually, testosterone comes in a concentration of either 100 mg/ml or 200 mg/ml.  We typically use the 200 mg/mL in this office.    Using a sterile, 18 G needle and 3 cc syringe, the testosterone cypionate was drawn up for a 1 cc injection.  To draw up the dose, I demonstrated how to first draw air into the syringe equal to the volume of the dosage. Then, wipe the top of the medication bottle with an alcohol wipe, insert the needle through the lid and into the medication, and push the air from your syringe into the bottle. Turn the bottle upside down and draw out the exact dosage of testosterone.  I demonstrated how to aspirate the syringe by hinge the syringe with its needle uncapped and pointing up in front of him.  Looking for air bubbles in the syringe. Flick the side of the syringe to get these bubbles to rise to the top.    When the dosage is bubble-free, I slowly depressed the plunger to force the air at the top of the syringe out stopping when a tiny drop of medication comes out of the tip of the syringe.  I advised him to be certain no air remained in the syringe as injecting air is very dangerous.  Being careful not to squirt or spray a significant portion of the dosage onto the floor.  Preparing the injection site, outer middle third of the vastus lateralis muscle of the thigh, I took a sterile alcohol pad and wipe the immediate area around where I intended him to inject.   I then  demonstrated how to change the needle from the 18 G to the 21 G needle.  I then gave him the syringe for injecting.  He correctly identified the injection location.  He held the syringe like a dart at a 90-degree angle above the sterile injection site. Quickly plunged it into the flesh. Before depressing the plunger, he drew back on it slightly and no blood was seen.  I advised him that if blood flashed in the syringe, he would need to remove the needle and then select a different location as he was in the vein.  Inject the medication at a steady, controlled pace.  He fully depressed the plunger, slowly pull the needle out. Pressing around the injection site with a sterile cotton swab as he did so - this preventing the emerging needle from pulling on the skin and causing extra pain.  We assessed the needle entry point for bleeding, and applied a sterile Band-Aid and/or cotton swab if needed. Disposed of the used needle and syringe in a proper sharps container.  I advised him to acquire a sharps container for his personal use at home.  I advised him that If, after injection, he experienced redness, swelling, or discomfort beyond that of normal soreness at the site of injection, call our office for an appointment and instructions.  He is to always store his medication at the recommended temperature, and always check the expiration date on  the bottle. If it's expired, don't use it.  Of course, keep all of med's out of reach of children.  Do not change his dose without consulting your provider.  His starting dose will be 1 cc every 2 weeks.  He will return 1 week after his fourth injection for a testosterone level  [name], PA-C   I spent 10 minutes on the day of the encounter to include pre-visit record review, face-to-face time with the patient, and post-visit ordering of tests.

## 2020-11-13 ENCOUNTER — Other Ambulatory Visit: Payer: Self-pay

## 2020-11-13 ENCOUNTER — Ambulatory Visit (INDEPENDENT_AMBULATORY_CARE_PROVIDER_SITE_OTHER): Payer: No Typology Code available for payment source | Admitting: Urology

## 2020-11-13 ENCOUNTER — Encounter: Payer: Self-pay | Admitting: Urology

## 2020-11-13 VITALS — BP 123/68 | HR 87 | Ht 74.0 in | Wt 211.9 lb

## 2020-11-13 DIAGNOSIS — E349 Endocrine disorder, unspecified: Secondary | ICD-10-CM | POA: Diagnosis not present

## 2020-11-13 MED ORDER — TESTOSTERONE CYPIONATE 200 MG/ML IM SOLN
200.0000 mg | Freq: Once | INTRAMUSCULAR | Status: AC
  Administered 2020-11-13: 200 mg via INTRAMUSCULAR

## 2020-12-04 ENCOUNTER — Other Ambulatory Visit: Payer: Self-pay | Admitting: Physical Medicine & Rehabilitation

## 2020-12-05 ENCOUNTER — Other Ambulatory Visit: Payer: Self-pay | Admitting: Family Medicine

## 2020-12-31 ENCOUNTER — Other Ambulatory Visit: Payer: Self-pay | Admitting: Physical Medicine & Rehabilitation

## 2021-01-01 ENCOUNTER — Other Ambulatory Visit: Payer: Self-pay

## 2021-01-01 ENCOUNTER — Other Ambulatory Visit: Payer: No Typology Code available for payment source

## 2021-01-01 DIAGNOSIS — E349 Endocrine disorder, unspecified: Secondary | ICD-10-CM

## 2021-01-02 ENCOUNTER — Telehealth: Payer: Self-pay | Admitting: Family Medicine

## 2021-01-02 DIAGNOSIS — E349 Endocrine disorder, unspecified: Secondary | ICD-10-CM

## 2021-01-02 LAB — TESTOSTERONE: Testosterone: 154 ng/dL — ABNORMAL LOW (ref 264–916)

## 2021-01-02 LAB — HEMATOCRIT: Hematocrit: 46.4 % (ref 37.5–51.0)

## 2021-01-02 LAB — HEMOGLOBIN: Hemoglobin: 16.2 g/dL (ref 13.0–17.7)

## 2021-01-02 NOTE — Telephone Encounter (Signed)
-----   Message from Nori Riis, PA-C sent at 01/02/2021  8:11 AM EST ----- Mr. Reichart's testosterone level is not therapeutic.  When did he last inject himself prior to his blood work.

## 2021-01-02 NOTE — Telephone Encounter (Signed)
Spoke to patient and he did the injection on Monday 12/24/2020. He is going to give his next injection on Monday 01/07/2021.

## 2021-01-04 ENCOUNTER — Encounter: Payer: Self-pay | Admitting: Family Medicine

## 2021-01-04 NOTE — Telephone Encounter (Signed)
Let's check his testosterone levels on Friday 1/21.

## 2021-01-04 NOTE — Telephone Encounter (Signed)
Sent a Mychart message with lab appointment date and time.

## 2021-01-11 ENCOUNTER — Other Ambulatory Visit: Payer: Self-pay

## 2021-01-11 ENCOUNTER — Other Ambulatory Visit: Payer: No Typology Code available for payment source

## 2021-01-11 DIAGNOSIS — E349 Endocrine disorder, unspecified: Secondary | ICD-10-CM

## 2021-01-12 LAB — TESTOSTERONE: Testosterone: 718 ng/dL (ref 264–916)

## 2021-01-28 ENCOUNTER — Other Ambulatory Visit: Payer: Self-pay

## 2021-01-28 ENCOUNTER — Emergency Department
Admission: EM | Admit: 2021-01-28 | Discharge: 2021-01-28 | Disposition: A | Discharge status: Home / Self Care | Payer: No Typology Code available for payment source | Attending: Student in an Organized Health Care Education/Training Program | Admitting: Student in an Organized Health Care Education/Training Program

## 2021-01-28 ENCOUNTER — Emergency Department: Payer: No Typology Code available for payment source

## 2021-01-28 DIAGNOSIS — Z79899 Other long term (current) drug therapy: Secondary | ICD-10-CM | POA: Insufficient documentation

## 2021-01-28 DIAGNOSIS — K219 Gastro-esophageal reflux disease without esophagitis: Secondary | ICD-10-CM | POA: Diagnosis not present

## 2021-01-28 DIAGNOSIS — I10 Essential (primary) hypertension: Secondary | ICD-10-CM | POA: Diagnosis not present

## 2021-01-28 DIAGNOSIS — R110 Nausea: Secondary | ICD-10-CM | POA: Diagnosis not present

## 2021-01-28 DIAGNOSIS — R1031 Right lower quadrant pain: Secondary | ICD-10-CM | POA: Diagnosis present

## 2021-01-28 LAB — BASIC METABOLIC PANEL
Anion gap: 9 (ref 5–15)
BUN: 20 mg/dL (ref 6–20)
CO2: 24 mmol/L (ref 22–32)
Calcium: 9.1 mg/dL (ref 8.9–10.3)
Chloride: 105 mmol/L (ref 98–111)
Creatinine, Ser: 1.08 mg/dL (ref 0.61–1.24)
GFR, Estimated: >60 mL/min (ref >60)
Glucose, Bld: 95 mg/dL (ref 70–99)
Potassium: 4 mmol/L (ref 3.5–5.1)
Sodium: 138 mmol/L (ref 135–145)

## 2021-01-28 MED ORDER — IOHEXOL 300 MG/ML  SOLN
100.0000 mL | Freq: Once | INTRAMUSCULAR | Status: AC | PRN
  Administered 2021-01-28: 100 mL via INTRAVENOUS
  Filled 2021-01-28: qty 100

## 2021-01-28 NOTE — ED Provider Notes (Signed)
[facility] Emergency Department Provider Note    Event Date/Time   First MD Initiated Contact with Patient 01/28/21 1214     (approximate)  I have reviewed the triage vital signs and the nursing notes.   HISTORY  Chief Complaint Abdominal Pain    HPI Daniel Calderon is a 55 y.o. male below listed past medical history presents to the ER for evaluation of right lower quadrant pain that started yesterday became increasingly worse today.  What describes as waxing waning in severity does feel like it is going into his groin.  Denies any blood in his urine.  Is never had pain like this before.  Did not feel any popping sensation.  No previous surgeries.  No measured fevers.  Does feel nauseated.    Past Medical History:  Diagnosis Date  . Allergy    Seasonal  . Anxiety   . Benign enlargement of prostate   . Depression   . Elevated BP   . Elevated transaminase level   . Failure of erection   . Fatigue   . Frequent headaches   . GERD (gastroesophageal reflux disease)   . History of kidney stones   . Hypertension   . Hypogonadism in male   . Migraine   . Sinus congestion 09/01/2017  . Sleep apnea    Currently uses cpap  . Thoracic outlet syndrome    Left Arm  . Tongue pain 02/09/2019   Family History  Problem Relation Age of Onset  . Hypertension Mother        Living  . Hearing loss Mother   . Heart disease Other   . Healthy Father        Living  . Diabetes Brother   . Stroke Paternal Uncle   . Heart attack Paternal Uncle   . Hearing loss Maternal Grandmother   . Healthy Son   . Healthy Daughter   . Kidney cancer Neg Hx   . Bladder Cancer Neg Hx   . Prostate cancer Neg Hx    Past Surgical History:  Procedure Laterality Date  . COLONOSCOPY WITH PROPOFOL N/A 11/11/2017   Procedure: COLONOSCOPY WITH PROPOFOL;  Surgeon: Lin Landsman, MD;  Location: [place];  Service: Endoscopy;  Laterality: N/A;  .  ESOPHAGOGASTRODUODENOSCOPY (EGD) WITH PROPOFOL N/A 08/25/2019   Procedure: ESOPHAGOGASTRODUODENOSCOPY (EGD) WITH PROPOFOL;  Surgeon: Lin Landsman, MD;  Location: [facility] ENDOSCOPY;  Service: Gastroenterology;  Laterality: N/A;  . KNEE ARTHROSCOPY WITH MEDIAL MENISECTOMY Left 07/26/2018   Procedure: KNEE ARTHROSCOPY WITH PARTIAL MEDIAL MENISECTOMY;  Surgeon: Leim Fabry, MD;  Location: [facility] ORS;  Service: Orthopedics;  Laterality: Left;  . Left Shoulder Surgery  2011  . nerve block     2 in neck. 5 in back.  . NOSE SURGERY    . POLYPECTOMY  11/11/2017   Procedure: POLYPECTOMY;  Surgeon: Lin Landsman, MD;  Location: [place];  Service: Endoscopy;;  . SCALENE NODE BIOPSY / EXCISION    . VASECTOMY     Patient Active Problem List   Diagnosis Date Noted  . Left sided numbness 02/13/2020  . Hyperlipidemia 12/07/2019  . Radiculopathy, cervical and lumbar 12/06/2019  . Abdominal pain, epigastric   . Left flank pain 08/15/2019  . Elevated LFTs 08/10/2019  . Hypogammaglobulinemia ([place]) 05/17/2019  . Excessive daytime sleepiness 03/28/2019  . Cataplexy 03/28/2019  . OSA on CPAP 03/28/2019  . Acute pain of left knee 04/18/2018  . Cutaneous skin tags 04/18/2018  .  Lower extremity edema 04/18/2018  . Thoracic outlet syndrome 02/19/2018  . Pain in limb 02/19/2018  . Muscle strain 09/01/2017  . Chest pain 03/21/2017  . Dyspnea on exertion 02/19/2017  . Language difficulty 02/19/2017  . BPH with obstruction/lower urinary tract symptoms 12/03/2015  . Medication refill 09/13/2015  . Erectile disorder, generalized, mild 07/26/2015  . Hypogonadism in male 05/24/2015  . Environmental allergies 04/13/2015  . Sleep apnea 04/13/2015  . Migraines 04/13/2015  . Neck pain 04/13/2015  . Adjustment disorder with mixed anxiety and depressed mood 04/13/2015  . Psoriasis 04/13/2015  . History of kidney stones 04/13/2015  . Neuropathy 04/13/2015  . Benign fibroma of prostate 03/24/2015   . Hypertension 03/24/2015  . Elevation of level of transaminase or lactic acid dehydrogenase (LDH) 03/24/2015  . Fatigue 03/24/2015      Prior to Admission medications   Medication Sig Start Date End Date Taking? Authorizing Provider  ALPRAZolam Duanne Moron) 1 MG tablet Take 45 minutes prior to injection.  Must have driver while on this medication. 11/07/20   [provider]  amLODipine (NORVASC) 10 MG tablet TAKE 1 TABLET BY MOUTH DAILY. 06/29/20   [name], MD  atorvastatin (LIPITOR) 40 MG tablet Take 1 tablet (40 mg total) by mouth daily at 6 PM. 12/07/19   Donzetta Starch, NP  ibuprofen (ADVIL) 600 MG tablet Take 1 tablet (600 mg total) by mouth every 6 (six) hours as needed. 07/31/20   Melynda Ripple, MD  Multiple Vitamin (MULTIVITAMIN) tablet Take 1 tablet by mouth daily.    [provider]  nortriptyline (PAMELOR) 10 MG capsule Take 10 mg by mouth at bedtime.    [provider]  omeprazole (PRILOSEC) 20 MG capsule TAKE 1 CAPSULE (20 MG TOTAL) BY MOUTH DAILY. 12/05/20   [name], MD  sertraline (ZOLOFT) 100 MG tablet TAKE 1 TABLET BY MOUTH ONCE DAILY. 05/07/20   [name], MD  testosterone cypionate (DEPOTESTOSTERONE CYPIONATE) 200 MG/ML injection Inject 1 mL (200 mg total) into the muscle every 14 (fourteen) days. 10/05/20   Stoioff, Ronda Fairly, MD    Allergies Erythromycin, Cephalexin, Dexamethasone, Oxycodone-acetaminophen, Prednisone, Gabapentin, and Ibuprofen    Social History Social History   Tobacco Use  . Smoking status: Never Smoker  . Smokeless tobacco: Never Used  Vaping Use  . Vaping Use: Never used  Substance Use Topics  . Alcohol use: Yes    Alcohol/week: 3.0 standard drinks    Types: 3 Cans of beer per week  . Drug use: No    Review of Systems Patient denies headaches, rhinorrhea, blurry vision, numbness, shortness of breath, chest pain, edema, cough, abdominal pain, nausea, vomiting, diarrhea, dysuria,  fevers, rashes or hallucinations unless otherwise stated above in HPI. ____________________________________________   PHYSICAL EXAM:  VITAL SIGNS: Vitals:   01/28/21 0936  BP: 139/89  Pulse: 90  Resp: 18  Temp: 98.1 F (36.7 C)  SpO2: 98%    Constitutional: Alert and oriented.  Eyes: Conjunctivae are normal.  Head: Atraumatic. Nose: No congestion/rhinnorhea. Mouth/Throat: Mucous membranes are moist.   Neck: No stridor. Painless ROM.  Cardiovascular: Normal rate, regular rhythm. Grossly normal heart sounds.  Good peripheral circulation. Respiratory: Normal respiratory effort.  No retractions. Lungs CTAB. Gastrointestinal: Soft, RLQ ttp  Area of discomfort in similar distribution of lateral femoral cutaneous nerve. No appreciable hernia or mass noted. No distention. No abdominal bruits. No CVA tenderness. Genitourinary:  Musculoskeletal: No lower extremity tenderness nor edema.  No joint effusions. Neurologic:  Normal speech and language. No gross focal neurologic deficits are appreciated. No facial droop Skin:  Skin is warm, dry and intact. No rash noted. Psychiatric: Mood and affect are normal. Speech and behavior are normal.  ____________________________________________   LABS (all labs ordered are listed, but only abnormal results are displayed)  Results for orders placed or performed during the hospital encounter of 01/28/21 (from the past 24 hour(s))  Basic metabolic panel     Status: None   Collection Time: 01/28/21  1:30 PM  Result Value Ref Range   Sodium 138 135 - 145 mmol/L   Potassium 4.0 3.5 - 5.1 mmol/L   Chloride 105 98 - 111 mmol/L   CO2 24 22 - 32 mmol/L   Glucose, Bld 95 70 - 99 mg/dL   BUN 20 6 - 20 mg/dL   Creatinine, Ser 1.08 0.61 - 1.24 mg/dL   Calcium 9.1 8.9 - 10.3 mg/dL   GFR, Estimated >60 >60 mL/min   Anion gap 9 5 - 15   ____________________________________________ _____________________________  RADIOLOGY  I personally reviewed all  radiographic images ordered to evaluate for the above acute complaints and reviewed radiology reports and findings.  These findings were personally discussed with the patient.  Please see medical record for radiology report.  ____________________________________________   PROCEDURES  Procedure(s) performed:  Procedures    Critical Care performed: no ____________________________________________   INITIAL IMPRESSION / ASSESSMENT AND PLAN / ED COURSE  Pertinent labs & imaging results that were available during my care of the patient were reviewed by me and considered in my medical decision making (see chart for details).   DDX: appendicitis, enteritis, colitis, diverticulitis, hernia, stone, msk strain  Daniel Calderon is a 55 y.o. who presents to the ED with presentation as described above sent over from clinic due to concern for possible appendicitis or above differential.  Has no white count is afebrile.  Blood work is reassuring.  CT imaging also reassuring.  His exam and presentation is suggestive of meralgia paresthetica.  We discussed conservative management.  I do not see any indication for hospitalization.  We discussed follow-up with PCP and signs and symptoms for which he should return to the ER.     The patient was evaluated in Emergency Department today for the symptoms described in the history of present illness. He/she was evaluated in the context of the global COVID-19 pandemic, which necessitated consideration that the patient might be at risk for infection with the SARS-CoV-2 virus that causes COVID-19. Institutional protocols and algorithms that pertain to the evaluation of patients at risk for COVID-19 are in a state of rapid change based on information released by regulatory bodies including the CDC and federal and state organizations. These policies and algorithms were followed during the patient's care in the ED.  As part of my medical decision making, I reviewed the  following data within the [place] notes reviewed and incorporated, Labs reviewed, notes from prior ED visits and [place] Controlled Substance Database   ____________________________________________   FINAL CLINICAL IMPRESSION(S) / ED DIAGNOSES  Final diagnoses:  Abdominal pain, RLQ      NEW MEDICATIONS STARTED DURING THIS VISIT:  New Prescriptions   No medications on file     Note:  This document was prepared using Dragon voice recognition software and may include unintentional dictation errors.    Merlyn Lot, MD 01/28/21 [phone]

## 2021-01-28 NOTE — ED Triage Notes (Signed)
Pt sent from [facility] with c/o RLQ pain over the weekend with nausea. Pt had labs performed at [facility] that are in care everywhere

## 2021-01-28 NOTE — Discharge Instructions (Signed)
Your CT scan and blood work is reassuring.  As we discussed, your symptoms seem to be consistent with something called Meralgia Paresthetica.  It is completely benign and should resolve on its own.  Avoid wearing tight garments or irritation to the area.  Follow up with PCP.

## 2021-02-05 ENCOUNTER — Other Ambulatory Visit: Payer: Self-pay

## 2021-02-05 ENCOUNTER — Ambulatory Visit: Payer: No Typology Code available for payment source | Admitting: Dermatology

## 2021-02-05 DIAGNOSIS — D2371 Other benign neoplasm of skin of right lower limb, including hip: Secondary | ICD-10-CM

## 2021-02-05 DIAGNOSIS — L409 Psoriasis, unspecified: Secondary | ICD-10-CM

## 2021-02-05 MED ORDER — OTEZLA 30 MG PO TABS: 30.0000 mg | ORAL_TABLET | Freq: Two times a day (BID) | ORAL | 12 refills | Status: DC

## 2021-02-05 NOTE — Progress Notes (Signed)
   Follow-Up Visit   Subjective  Daniel Calderon is a 55 y.o. male who presents for the following: Follow-up (Patient is here today concerning itchy spot on right upper calf. States its been there for 3 - 4 months and has been growing. ).  It is very tender and irritating and he would like it removed.  He also has psoriasis and is interested in trying treatment other than topical creams which don't work. Patient has tried and failed topical steroid creams, calcipotriene, bethametasone dipropinate cream.    The following portions of the chart were reviewed this encounter and updated as appropriate:       Objective  Well appearing patient in no apparent distress; mood and affect are within normal limits.  A focused examination was performed including right calf. Relevant physical exam findings are noted in the Assessment and Plan.  Objective  bilateral knees, bilateral elbows, lower back: Well-demarcated erythematous papules/plaques with silvery scale BSA 6%  Assessment & Plan  Psoriasis bilateral knees, bilateral elbows, lower back  Chronic condition, not improved with use of topicals in past, not interested in using more topical Rx creams  Start Otezla 30 mg PO bid.  Sample taper pack given to pt Sent to [facility]  Try Amlactin cream or Cerave psoriasis cream qd/bid to scaly plaques  Side effects of Otezla (apremilast) include diarrhea, nausea, headache, upper respiratory infection, depression, and weight decrease (5-10%). It should only be taken by pregnant women after a discussion regarding risks and benefits with their doctor. Goal is control of skin condition, not cure.  The use of [place] [facility] requires long term medication management, including periodic office visits.      Dermatofibroma Right lower leg - 1 cm firm pink/brown papulenodule with dimple sign - Benign appearing - Symptomatic - Discussed surgical option with patient, including resulting scar and  possible recurrence.  Pre op form given to patient.    Return in about 6 days (around 02/11/2021) for schedule for surgery of dermatofibroma on right lower leg.  I, Ruthell Rummage, CMA, am acting as scribe for Brendolyn Patty, MD.  Documentation: I have reviewed the above documentation for accuracy and completeness, and I agree with the above.  Brendolyn Patty MD

## 2021-02-05 NOTE — Patient Instructions (Addendum)
Side effects of Otezla (apremilast) include diarrhea, nausea, headache, upper respiratory infection, depression, and weight decrease (5-10%). It should only be taken by pregnant women after a discussion regarding risks and benefits with their doctor. Goal is control of skin condition, not cure.  The use of [place] [facility] requires long term medication management, including periodic office visits.    Pre-Operative Instructions  You are scheduled for a surgical procedure at [facility]. We recommend you read the following instructions. If you have any questions or concerns, please call the office at [phone].  1. Shower and wash the entire body with soap and water the day of your surgery paying special attention to cleansing at and around the planned surgery site.  2. Avoid aspirin or aspirin containing products at least fourteen (14) days prior to your surgical procedure and for at least one week (7 Days) after your surgical procedure. If you take aspirin on a regular basis for heart disease or history of stroke or for any other reason, we may recommend you continue taking aspirin but please notify us if you take this on a regular basis. Aspirin can cause more bleeding to occur during surgery as well as prolonged bleeding and bruising after surgery.   3. Avoid other nonsteroidal pain medications at least one week prior to surgery and at least one week prior to your surgery. These include medications such as Ibuprofen (Motrin, Advil and Nuprin), Naprosyn, Voltaren, Relafen, etc. If medications are used for therapeutic reasons, please inform us as they can cause increased bleeding or prolonged bleeding during and bruising after surgical procedures.   4. Please advise [name] if you are taking any "blood thinner" medications such as Coumadin or Dipyridamole or Plavix or similar medications. These cause increased bleeding and prolonged bleeding during procedures and bruising after surgical procedures. We may  have to consider discontinuing these medications briefly prior to and shortly after your surgery if safe to do so.   5. Please inform us of all medications you are currently taking. All medications that are taken regularly should be taken the day of surgery as you always do. Nevertheless, we need to be informed of what medications you are taking prior to surgery to know whether they will affect the procedure or cause any complications.   6. Please inform us of any medication allergies. Also inform us of whether you have allergies to Latex or rubber products or whether you have had any adverse reaction to Lidocaine or Epinephrine.  7. Please inform us of any prosthetic or artificial body parts such as artificial heart valve, joint replacements, etc., or similar condition that might require preoperative antibiotics.   8. We recommend avoidance of alcohol at least two weeks prior to surgery and continued avoidance for at least two weeks after surgery.   9. We recommend discontinuation of tobacco smoking at least two weeks prior to surgery and continued abstinence for at least two weeks after surgery.  10. Do not plan strenuous exercise, strenuous work or strenuous lifting for approximately four weeks after your surgery.   11. We request if you are unable to make your scheduled surgical appointment, please call us at least a week in advance or as soon as you are aware of a problem so that we can cancel or reschedule the appointment.   12. You MAY TAKE TYLENOL (acetaminophen) for pain as it is not a blood thinner.   13. PLEASE PLAN TO BE IN TOWN FOR TWO WEEKS FOLLOWING SURGERY, THIS IS IMPORTANT  SO YOU CAN BE CHECKED FOR DRESSING CHANGES, SUTURE REMOVAL AND TO MONITOR FOR POSSIBLE COMPLICATIONS.

## 2021-02-06 ENCOUNTER — Other Ambulatory Visit: Payer: Self-pay

## 2021-02-06 DIAGNOSIS — L409 Psoriasis, unspecified: Secondary | ICD-10-CM

## 2021-02-06 MED ORDER — OTEZLA 30 MG PO TABS: 30.0000 mg | ORAL_TABLET | Freq: Two times a day (BID) | ORAL | 5 refills | Status: DC

## 2021-02-11 ENCOUNTER — Ambulatory Visit (INDEPENDENT_AMBULATORY_CARE_PROVIDER_SITE_OTHER): Payer: No Typology Code available for payment source | Admitting: Dermatology

## 2021-02-11 ENCOUNTER — Other Ambulatory Visit: Payer: Self-pay

## 2021-02-11 DIAGNOSIS — D2371 Other benign neoplasm of skin of right lower limb, including hip: Secondary | ICD-10-CM | POA: Diagnosis not present

## 2021-02-11 DIAGNOSIS — D492 Neoplasm of unspecified behavior of bone, soft tissue, and skin: Secondary | ICD-10-CM

## 2021-02-11 NOTE — Progress Notes (Signed)
   Follow-Up Visit   Subjective  Daniel Calderon is a 55 y.o. male who presents for the following: Dermatofibrima (R lower leg, symptomatic, pt presents for excision).   The following portions of the chart were reviewed this encounter and updated as appropriate:       Review of Systems:  No other skin or systemic complaints except as noted in HPI or Assessment and Plan.  Objective  Well appearing patient in no apparent distress; mood and affect are within normal limits.  A focused examination was performed including R leg. Relevant physical exam findings are noted in the Assessment and Plan.  Objective  R medial inferior knee: 1.1cm firm pink brown papulonodule with dimple sign   Assessment & Plan  Neoplasm of skin R medial inferior knee  Skin excision  Lesion length (cm):  1.1 Lesion width (cm):  1.1 Margin per side (cm):  0.2 Total excision diameter (cm):  1.5 Informed consent: discussed and consent obtained   Timeout: patient name, date of birth, surgical site, and procedure verified   Procedure prep:  Patient was prepped and draped in usual sterile fashion Prep type:  Povidone-iodine Anesthesia: the lesion was anesthetized in a standard fashion   Anesthetic:  1% lidocaine w/ epinephrine 1-100,000 buffered w/ 8.4% NaHCO3 (17.5cc) Instrument used: #15 blade   Hemostasis achieved with: suture, pressure and electrodesiccation   Outcome: patient tolerated procedure well with no complications   Additional details:  Tag at inferior tip.  Skin repair Complexity:  Intermediate Final length (cm):  4.2 Informed consent: discussed and consent obtained   Reason for type of repair: reduce tension to allow closure, reduce the risk of dehiscence, infection, and necrosis, reduce subcutaneous dead space and avoid a hematoma, allow closure of the large defect and preserve normal anatomical and functional relationships   Undermining: edges undermined   Subcutaneous layers (deep  stitches):  Suture size:  3-0 Suture type: Vicryl (polyglactin 910)   Stitches:  Buried vertical mattress Fine/surface layer approximation (top stitches):  Suture size:  4-0 Suture type: nylon   Stitches: simple interrupted   Suture removal (days):  7 Hemostasis achieved with: suture Outcome: patient tolerated procedure well with no complications   Post-procedure details: sterile dressing applied and wound care instructions given   Dressing type: pressure dressing (Mupirocin)    Specimen 1 - Surgical pathology Differential Diagnosis: D48.5 Irritated Dermatofibroma vs Cyst vs other Check Margins: yes 1.1cm firm pink brown papulonodule with dimple sign Tag at inferior tip   Irritated Dermatofibroma vs Cyst vs other R medial inferior knee bx/excision today  Return in about 1 week (around 02/18/2021) for suture removal.   Documentation: I have reviewed the above documentation for accuracy and completeness, and I agree with the above.  Brendolyn Patty MD

## 2021-02-11 NOTE — Patient Instructions (Signed)

## 2021-02-12 ENCOUNTER — Telehealth: Payer: Self-pay

## 2021-02-12 NOTE — Telephone Encounter (Signed)
Left patient a message to call if any problems after yesterday's surgery./sh 

## 2021-02-18 ENCOUNTER — Ambulatory Visit (INDEPENDENT_AMBULATORY_CARE_PROVIDER_SITE_OTHER): Payer: No Typology Code available for payment source | Admitting: Dermatology

## 2021-02-18 ENCOUNTER — Other Ambulatory Visit: Payer: Self-pay | Admitting: Dermatology

## 2021-02-18 ENCOUNTER — Other Ambulatory Visit: Payer: Self-pay

## 2021-02-18 DIAGNOSIS — D2371 Other benign neoplasm of skin of right lower limb, including hip: Secondary | ICD-10-CM

## 2021-02-18 DIAGNOSIS — D239 Other benign neoplasm of skin, unspecified: Secondary | ICD-10-CM

## 2021-02-18 MED ORDER — DOXYCYCLINE MONOHYDRATE 100 MG PO CAPS: 100.0000 mg | ORAL_CAPSULE | Freq: Two times a day (BID) | ORAL | 0 refills | Status: DC

## 2021-02-18 NOTE — Progress Notes (Signed)
   Follow-Up Visit   Subjective  Daniel Calderon is a 55 y.o. male who presents for the following: dermatofibroma bx proven (R medial inferior knee, pt presents for sr).   The following portions of the chart were reviewed this encounter and updated as appropriate:       Review of Systems:  No other skin or systemic complaints except as noted in HPI or Assessment and Plan.  Objective  Well appearing patient in no apparent distress; mood and affect are within normal limits.  A focused examination was performed including R leg. Relevant physical exam findings are noted in the Assessment and Plan.  Objective  Right medial inferior knee: Healing excision site, tenderness to touch, mild erythema surrounding excision site, wound is dry   Assessment & Plan  Dermatofibroma Right medial inferior knee  Bx proven, Benign, margins free Healing excision site with tenderness to touch  Wound cleansed, sutures removed, wound cleansed and steri strips applied. Discussed pathology results.   Start Doxycycline 100mg  1 po bid with food and drink x 7 days  Discussed s/sx of infection.  He will RTC if worsening  Doxycycline should be taken with food to prevent nausea. Do not lay down for 30 minutes after taking. Be cautious with sun exposure and use good sun protection while on this medication. Pregnant women should not take this medication.    doxycycline (MONODOX) 100 MG capsule - Right medial inferior knee  Return if symptoms worsen or fail to improve.   I, Othelia Pulling, RMA, am acting as scribe for Brendolyn Patty, MD . Documentation: I have reviewed the above documentation for accuracy and completeness, and I agree with the above.  Brendolyn Patty MD

## 2021-02-19 ENCOUNTER — Telehealth: Payer: Self-pay | Admitting: Pharmacist

## 2021-02-19 NOTE — Telephone Encounter (Signed)
Pt has called back at 5:50 and states you may reach him on 3/2 at [phone].

## 2021-02-19 NOTE — Telephone Encounter (Signed)
Called patient to schedule an appointment for the [employer] [facility]. I was unable to reach the patient so I left a HIPAA-compliant message requesting that the patient return my call.   Luke Van Ausdall, PharmD, BCACP, CPP Clinical Pharmacist [facility] [phone]  

## 2021-02-20 ENCOUNTER — Ambulatory Visit (HOSPITAL_BASED_OUTPATIENT_CLINIC_OR_DEPARTMENT_OTHER): Payer: No Typology Code available for payment source | Admitting: Pharmacist

## 2021-02-20 ENCOUNTER — Other Ambulatory Visit: Payer: Self-pay

## 2021-02-20 ENCOUNTER — Other Ambulatory Visit: Payer: Self-pay | Admitting: Internal Medicine

## 2021-02-20 DIAGNOSIS — Z79899 Other long term (current) drug therapy: Secondary | ICD-10-CM

## 2021-02-20 DIAGNOSIS — L409 Psoriasis, unspecified: Secondary | ICD-10-CM

## 2021-02-20 MED ORDER — OTEZLA 30 MG PO TABS: 30.0000 mg | ORAL_TABLET | Freq: Two times a day (BID) | ORAL | 6 refills | Status: DC

## 2021-02-20 NOTE — Progress Notes (Signed)
  S: Patient presents for review of their specialty medication therapy.  Patient is currently taking Otezla for psoriasis. Patient is managed by Dr. Nicole Kindred for this.   Adherence: sample starter pack given 02/05/2021. Now on the 30 mg BID dose.   Efficacy: too early to tell.   Dosing:  Active psoriatic arthritis or plaque psoriasis (moderate to severe): Oral: Initial: 10 mg in the morning. Titrate upward by additional 10 mg per day on days 2 to 5 as follows: Day 2: 10 mg twice daily; Day 3: 10 mg in the morning and 20 mg in the evening; Day 4: 20 mg twice daily; Day 5: 20 mg in the morning and 30 mg in the evening. Maintenance dose: 30 mg twice daily starting on day 6  Current adverse effects: Headache: none GI upset: none Weight loss: none Neuropsychiatric effects: none  O:  Lab Results  Component Value Date   WBC 8.6 04/25/2020   HGB 16.2 01/01/2021   HCT 46.4 01/01/2021   MCV 85.7 04/25/2020   PLT 201 04/25/2020      Chemistry      Component Value Date/Time   NA 138 01/28/2021 1330   NA 141 03/10/2019 1523   K 4.0 01/28/2021 1330   CL 105 01/28/2021 1330   CO2 24 01/28/2021 1330   BUN 20 01/28/2021 1330   BUN 17 03/10/2019 1523   CREATININE 1.08 01/28/2021 1330   GLU 100 10/02/2017 0000      Component Value Date/Time   CALCIUM 9.1 01/28/2021 1330   ALKPHOS 96 12/06/2019 1145   AST 42 (H) 12/06/2019 1145   ALT 68 (H) 12/06/2019 1145   BILITOT 0.8 12/06/2019 1145   BILITOT 0.4 03/10/2019 1523       A/P: 1. Medication review: patient is currently on [place] for psoriasis and is tolerating it well. Reviewed the medication including the following: apremilast inhibits phosphodiesterase 4 (PDE4) specific for cyclic adenosine monophosphate (cAMP) which results in increased intracellular cAMP levels and regulation of numerous inflammatory mediators (eg, decreased expression of nitric oxide synthase, TNF-alpha, and interleukin [IL]-23, as well as increased IL-10. Patient  educated on purpose, proper use and potential adverse effects of Otezla. Possible adverse effects include weight loss, GI upset, headache, and mood changes. Renal function should be routinely monitored. Administer without regard to food. Do not crush, chew, or split tablets. No recommendations for any changes at this time.   Benard Halsted, PharmD, [place], [place] [phone]

## 2021-02-21 ENCOUNTER — Telehealth: Payer: Self-pay

## 2021-02-21 MED FILL — OTEZLA 30 MG TABS: 30 | 30 days supply | Qty: 60 | Fill #0

## 2021-02-21 NOTE — Telephone Encounter (Signed)
Copied from [place] [id number]. Topic: General - Other >> Feb 20, 2021  1:32 PM Mcneil, Ja-Kwan wrote: Reason for CRM: Pt tated he was returning call to [facility]. Attempted to transfer call to the office but there was no answer. Pt requests call back

## 2021-02-21 NOTE — Telephone Encounter (Signed)
Patient was contacted concerning his specialty therapy.

## 2021-02-27 ENCOUNTER — Other Ambulatory Visit: Payer: Self-pay | Admitting: Dermatology

## 2021-02-27 ENCOUNTER — Encounter: Payer: Self-pay | Admitting: Dermatology

## 2021-02-27 ENCOUNTER — Other Ambulatory Visit: Payer: Self-pay

## 2021-02-27 ENCOUNTER — Ambulatory Visit (INDEPENDENT_AMBULATORY_CARE_PROVIDER_SITE_OTHER): Payer: No Typology Code available for payment source | Admitting: Dermatology

## 2021-02-27 DIAGNOSIS — L249 Irritant contact dermatitis, unspecified cause: Secondary | ICD-10-CM

## 2021-02-27 DIAGNOSIS — L089 Local infection of the skin and subcutaneous tissue, unspecified: Secondary | ICD-10-CM

## 2021-02-27 MED ORDER — MUPIROCIN 2 % EX OINT: 1.0000 "application " | TOPICAL_OINTMENT | Freq: Two times a day (BID) | CUTANEOUS | 1 refills | Status: DC

## 2021-02-27 MED ORDER — DOXYCYCLINE HYCLATE 100 MG PO CAPS: 100.0000 mg | ORAL_CAPSULE | Freq: Two times a day (BID) | ORAL | 0 refills | Status: DC

## 2021-02-27 NOTE — Progress Notes (Signed)
   Follow-Up Visit   Subjective  Daniel Calderon is a 55 y.o. male who presents for the following: Wound Check (Right medial inferior knee. Hx of dermatofibroma excision. Patient is concerned area is infected. Bx 02/11/2021. Painful, itching. ).     The following portions of the chart were reviewed this encounter and updated as appropriate:      Review of Systems: No other skin or systemic complaints except as noted in HPI or Assessment and Plan.  Objective  Well appearing patient in no apparent distress; mood and affect are within normal limits.  A focused examination was performed including face and right knee. Relevant physical exam findings are noted in the Assessment and Plan.  Objective  Right medial leg below knee: Erythematous linear scar with mild dehiscence and crusting, tender to touch  Images    Objective  Right medial leg below knee: scattered erythematous papules   Assessment & Plan  Wound infection Right medial leg below knee  Bacterial C&S performed today.  Rf Doxycycline 100mg  1 po bid with food x7 days if becomes red, more painful or begins draining purulent discharge.   Start Mupirocin BID, keep covered.  Anaerobic and Aerobic Culture - Right medial leg below knee  doxycycline (VIBRAMYCIN) 100 MG capsule - Right medial leg below knee  mupirocin ointment (BACTROBAN) 2 % - Right medial leg below knee  Irritant contact dermatitis, unspecified trigger Right medial leg below knee  Avoid adhesive bandages to area  Return if symptoms worsen or fail to improve.   I, Emelia Salisbury, CMA, am acting as scribe for Brendolyn Patty, MD.  Documentation: I have reviewed the above documentation for accuracy and completeness, and I agree with the above.  Brendolyn Patty MD

## 2021-02-27 NOTE — Telephone Encounter (Signed)
Advised pt to come into office today at 2:00pm for Dr. Nicole Kindred to evaluate and culture excision site./sh

## 2021-03-04 ENCOUNTER — Telehealth: Payer: Self-pay

## 2021-03-04 LAB — ANAEROBIC AND AEROBIC CULTURE

## 2021-03-04 LAB — SPECIMEN STATUS REPORT

## 2021-03-04 NOTE — Telephone Encounter (Signed)
Left patient a message advising culture was negative./sh

## 2021-03-04 NOTE — Telephone Encounter (Signed)
-----   Message from Brendolyn Patty, MD sent at 03/04/2021  3:25 PM EDT ----- Bacterial culture negative.

## 2021-03-04 NOTE — Telephone Encounter (Signed)
LM on VM bacterial culture results negative, please give [name] a call if he has any questions

## 2021-03-05 ENCOUNTER — Telehealth: Payer: Self-pay

## 2021-03-05 NOTE — Telephone Encounter (Signed)
-----   Message from Brendolyn Patty, MD sent at 03/04/2021  3:25 PM EDT ----- Bacterial culture negative.

## 2021-03-05 NOTE — Telephone Encounter (Signed)
Patient informed of culture results.  

## 2021-03-11 ENCOUNTER — Other Ambulatory Visit: Payer: Self-pay

## 2021-03-11 DIAGNOSIS — E349 Endocrine disorder, unspecified: Secondary | ICD-10-CM

## 2021-03-12 ENCOUNTER — Other Ambulatory Visit: Payer: Self-pay | Admitting: Family Medicine

## 2021-03-18 ENCOUNTER — Other Ambulatory Visit: Payer: Self-pay

## 2021-03-18 ENCOUNTER — Other Ambulatory Visit: Payer: No Typology Code available for payment source

## 2021-03-18 DIAGNOSIS — E349 Endocrine disorder, unspecified: Secondary | ICD-10-CM

## 2021-03-19 LAB — HEMOGLOBIN AND HEMATOCRIT, BLOOD
Hematocrit: 42.3 % (ref 37.5–51.0)
Hemoglobin: 15.4 g/dL (ref 13.0–17.7)

## 2021-03-19 LAB — TESTOSTERONE: Testosterone: 680 ng/dL (ref 264–916)

## 2021-04-03 ENCOUNTER — Other Ambulatory Visit (HOSPITAL_COMMUNITY): Payer: Self-pay

## 2021-04-05 ENCOUNTER — Other Ambulatory Visit (HOSPITAL_COMMUNITY): Payer: Self-pay

## 2021-04-05 ENCOUNTER — Other Ambulatory Visit: Payer: Self-pay | Admitting: Urology

## 2021-04-05 ENCOUNTER — Other Ambulatory Visit: Payer: Self-pay

## 2021-04-05 MED FILL — Sertraline HCl Tab 100 MG: ORAL | 30 days supply | Qty: 45 | Fill #0 | Status: AC

## 2021-04-08 ENCOUNTER — Other Ambulatory Visit: Payer: Self-pay

## 2021-04-08 ENCOUNTER — Other Ambulatory Visit: Payer: Self-pay | Admitting: Urology

## 2021-04-16 ENCOUNTER — Other Ambulatory Visit: Payer: Self-pay | Admitting: Family Medicine

## 2021-04-16 ENCOUNTER — Other Ambulatory Visit: Payer: Self-pay

## 2021-04-16 ENCOUNTER — Other Ambulatory Visit: Payer: Self-pay | Admitting: Urology

## 2021-04-16 MED FILL — Omeprazole Cap Delayed Release 20 MG: ORAL | 30 days supply | Qty: 30 | Fill #0 | Status: AC

## 2021-04-16 MED FILL — Amlodipine Besylate Tab 10 MG (Base Equivalent): ORAL | 90 days supply | Qty: 90 | Fill #0 | Status: AC

## 2021-04-16 MED FILL — Testosterone Cypionate IM Inj in Oil 200 MG/ML: INTRAMUSCULAR | 28 days supply | Qty: 2 | Fill #0 | Status: AC

## 2021-05-06 ENCOUNTER — Other Ambulatory Visit (HOSPITAL_COMMUNITY): Payer: Self-pay

## 2021-05-13 ENCOUNTER — Other Ambulatory Visit: Payer: Self-pay

## 2021-05-13 ENCOUNTER — Other Ambulatory Visit (HOSPITAL_COMMUNITY): Payer: Self-pay

## 2021-05-13 MED ORDER — ALPRAZOLAM 0.5 MG PO TABS
ORAL_TABLET | ORAL | 0 refills | Status: DC
  Filled 2021-05-13: qty 1, 1d supply, fill #0

## 2021-05-13 MED FILL — Apremilast Tab 30 MG: ORAL | 30 days supply | Qty: 60 | Fill #0 | Status: AC

## 2021-05-16 ENCOUNTER — Other Ambulatory Visit: Payer: Self-pay

## 2021-05-16 MED FILL — Amlodipine Besylate Tab 10 MG (Base Equivalent): ORAL | 90 days supply | Qty: 90 | Fill #1 | Status: CN

## 2021-05-16 MED FILL — Testosterone Cypionate IM Inj in Oil 200 MG/ML: INTRAMUSCULAR | 28 days supply | Qty: 2 | Fill #1 | Status: AC

## 2021-05-16 MED FILL — Omeprazole Cap Delayed Release 20 MG: ORAL | 30 days supply | Qty: 30 | Fill #1 | Status: AC

## 2021-05-21 ENCOUNTER — Other Ambulatory Visit: Payer: Self-pay

## 2021-05-22 ENCOUNTER — Other Ambulatory Visit: Payer: Self-pay

## 2021-05-23 ENCOUNTER — Other Ambulatory Visit: Payer: Self-pay

## 2021-05-23 ENCOUNTER — Telehealth: Payer: Self-pay | Admitting: Family Medicine

## 2021-05-23 ENCOUNTER — Telehealth: Payer: Self-pay

## 2021-05-23 MED ORDER — SERTRALINE HCL 100 MG PO TABS
ORAL_TABLET | ORAL | 3 refills | Status: DC
  Filled 2021-05-23: qty 45, 30d supply, fill #0
  Filled 2021-07-10: qty 45, 30d supply, fill #1
  Filled 2021-08-19: qty 45, 30d supply, fill #2
  Filled 2021-10-07: qty 45, 30d supply, fill #3

## 2021-05-23 NOTE — Telephone Encounter (Signed)
I called the patient and scheduled a appointment so he can get the Zoloft.  Daniel Calderon,cma

## 2021-05-23 NOTE — Addendum Note (Signed)
Addended by: Elpidio Galea T on: 05/23/2021 10:26 AM   Modules accepted: Orders

## 2021-05-23 NOTE — Telephone Encounter (Signed)
Daniel Calderon the office at 6:22 am to request medicatoin refills and an appointment. Triage was not performed.

## 2021-05-23 NOTE — Telephone Encounter (Signed)
PT called to request a refill of their sertraline (ZOLOFT) 100 MG tablet.

## 2021-05-24 ENCOUNTER — Encounter: Payer: Self-pay | Admitting: Family Medicine

## 2021-05-24 ENCOUNTER — Other Ambulatory Visit: Payer: Self-pay

## 2021-05-24 ENCOUNTER — Ambulatory Visit (INDEPENDENT_AMBULATORY_CARE_PROVIDER_SITE_OTHER): Payer: No Typology Code available for payment source | Admitting: Family Medicine

## 2021-05-24 VITALS — BP 110/70 | HR 87 | Temp 98.2°F | Ht 74.0 in | Wt 216.0 lb

## 2021-05-24 DIAGNOSIS — E785 Hyperlipidemia, unspecified: Secondary | ICD-10-CM | POA: Diagnosis not present

## 2021-05-24 DIAGNOSIS — I10 Essential (primary) hypertension: Secondary | ICD-10-CM | POA: Diagnosis not present

## 2021-05-24 DIAGNOSIS — F4323 Adjustment disorder with mixed anxiety and depressed mood: Secondary | ICD-10-CM | POA: Diagnosis not present

## 2021-05-24 MED ORDER — SERTRALINE HCL 100 MG PO TABS
1.0000 | ORAL_TABLET | Freq: Every day | ORAL | 1 refills | Status: DC
  Filled 2021-05-24: qty 90, 90d supply, fill #0

## 2021-05-24 NOTE — Assessment & Plan Note (Addendum)
Well-controlled.  Continue Zoloft.  Patient does not want to taper off of this due to significant benefit from this medication.

## 2021-05-24 NOTE — Patient Instructions (Signed)
Nice to see you. We will contact you with your lab results. 

## 2021-05-24 NOTE — Assessment & Plan Note (Signed)
Well-controlled.  He will continue amlodipine 10 mg once daily. ?

## 2021-05-24 NOTE — Assessment & Plan Note (Signed)
Check lipid panel.  Continue Lipitor 40 mg once daily. °

## 2021-05-24 NOTE — Progress Notes (Signed)
Daniel Rumps, MD Phone: [phone]  Daniel Calderon is a 55 y.o. male who presents today for f/u.  HYPERTENSION  Disease Monitoring  Home BP Monitoring similar to today Chest pain- no    Dyspnea- no Medications  Compliance-  Taking amlodipine.  Edema- no  HYPERLIPIDEMIA Symptoms Chest pain on exertion:  no   Medications: Compliance- taking lipitor Right upper quadrant pain- no  Muscle aches- no  Anxiety/depression: Patient denies anxiety and depression.  He is taking Zoloft 100 mg once daily.  No SI.    Social History   Tobacco Use  Smoking Status Never Smoker  Smokeless Tobacco Never Used    Current Outpatient Medications on File Prior to Visit  Medication Sig Dispense Refill  . ALPRAZolam (XANAX) 0.5 MG tablet Take 1 tablet p.o. as needed anxiety 45 minutes prior to injection.  Must have driver while on this medication 1 tablet 0  . amLODipine (NORVASC) 10 MG tablet TAKE 1 TABLET BY MOUTH DAILY. 90 tablet 2  . Apremilast 30 MG TABS TAKE 1 TABLET (30 MG TOTAL) BY MOUTH 2 (TWO) TIMES DAILY. 60 tablet 6  . atorvastatin (LIPITOR) 40 MG tablet Take 1 tablet (40 mg total) by mouth daily at 6 PM. 30 tablet 2  . doxycycline (MONODOX) 100 MG capsule TAKE 1 CAPSULE BY MOUTH 2 TIMES DAILY. TAKE WITH FOOD AND DRINK 14 capsule 0  . doxycycline (VIBRAMYCIN) 100 MG capsule TAKE 1 CAPSULE BY MOUTH 2 TIMES DAILY FOR 7 DAYS. TAKE WITH FOOD 14 capsule 0  . ibuprofen (ADVIL) 600 MG tablet Take 1 tablet (600 mg total) by mouth every 6 (six) hours as needed. 30 tablet 0  . Multiple Vitamin (MULTIVITAMIN) tablet Take 1 tablet by mouth daily.    . mupirocin ointment (BACTROBAN) 2 % APPLY 1 APPLICATION TOPICALLY 2 TIMES DAILY. 22 g 1  . nortriptyline (PAMELOR) 10 MG capsule Take 10 mg by mouth at bedtime.    Marland Kitchen omeprazole (PRILOSEC) 20 MG capsule TAKE 1 CAPSULE (20 MG TOTAL) BY MOUTH DAILY. 30 capsule 2  . testosterone cypionate (DEPOTESTOSTERONE CYPIONATE) 200 MG/ML injection INJECT 1 ML  (200 MG TOTAL) INTO THE MUSCLE EVERY 14 (FOURTEEN) DAYS. 10 mL 0  . nortriptyline (PAMELOR) 10 MG capsule TAKE 1 CAPSULE BY MOUTH NIGHTLY FOR 1 WEEK, THEN INCREASE TO 2 CAPSULES NIGHTLY 60 capsule 3   No current facility-administered medications on file prior to visit.     ROS see history of present illness  Objective  Physical Exam Vitals:   05/24/21 1532  BP: 110/70  Pulse: 87  Temp: 98.2 F (36.8 C)  SpO2: 96%    BP Readings from Last 3 Encounters:  05/24/21 110/70  01/28/21 136/79  11/13/20 123/68   Wt Readings from Last 3 Encounters:  05/24/21 216 lb (98 kg)  01/28/21 210 lb (95.3 kg)  11/13/20 211 lb 14.4 oz (96.1 kg)    Physical Exam Constitutional:      General: He is not in acute distress.    Appearance: He is not diaphoretic.  Cardiovascular:     Rate and Rhythm: Normal rate and regular rhythm.     Heart sounds: Normal heart sounds.  Pulmonary:     Effort: Pulmonary effort is normal.     Breath sounds: Normal breath sounds.  Skin:    General: Skin is warm and dry.  Neurological:     Mental Status: He is alert.      Assessment/Plan: Please see individual problem list.  Problem List  Items Addressed This Visit    Hypertension - Primary    Well-controlled.  He will continue amlodipine 10 mg once daily.      Relevant Orders   Comp Met (CMET)   Adjustment disorder with mixed anxiety and depressed mood    Well-controlled.  Continue Zoloft.  Patient does not want to taper off of this due to significant benefit from this medication.      Relevant Medications   sertraline (ZOLOFT) 100 MG tablet   Hyperlipidemia    Check lipid panel.  Continue Lipitor 40 mg once daily.      Relevant Orders   Comp Met (CMET)   Lipid panel      Return in about 6 months (around 11/23/2021) for htn, anxiety/depression.  This visit occurred during the SARS-CoV-2 public health emergency.  Safety protocols were in place, including screening questions prior to the  visit, additional usage of staff PPE, and extensive cleaning of exam room while observing appropriate contact time as indicated for disinfecting solutions.    Daniel Rumps, MD [place]

## 2021-05-25 LAB — COMPREHENSIVE METABOLIC PANEL
AG Ratio: 2.8 (calc) — ABNORMAL HIGH (ref 1.0–2.5)
ALT: 94 U/L — ABNORMAL HIGH (ref 9–46)
AST: 55 U/L — ABNORMAL HIGH (ref 10–35)
Albumin: 5 g/dL (ref 3.6–5.1)
Alkaline phosphatase (APISO): 95 U/L (ref 35–144)
BUN: 18 mg/dL (ref 7–25)
CO2: 25 mmol/L (ref 20–32)
Calcium: 9.9 mg/dL (ref 8.6–10.3)
Chloride: 105 mmol/L (ref 98–110)
Creat: 1.26 mg/dL (ref 0.70–1.33)
Globulin: 1.8 g/dL (calc) — ABNORMAL LOW (ref 1.9–3.7)
Glucose, Bld: 78 mg/dL (ref 65–99)
Potassium: 4.5 mmol/L (ref 3.5–5.3)
Sodium: 141 mmol/L (ref 135–146)
Total Bilirubin: 0.5 mg/dL (ref 0.2–1.2)
Total Protein: 6.8 g/dL (ref 6.1–8.1)

## 2021-05-25 LAB — LIPID PANEL
Cholesterol: 186 mg/dL (ref <200)
HDL: 38 mg/dL — ABNORMAL LOW (ref >=40)
LDL Cholesterol (Calc): 107 mg/dL (calc) — ABNORMAL HIGH
Non-HDL Cholesterol (Calc): 148 mg/dL (calc) — ABNORMAL HIGH (ref <130)
Total CHOL/HDL Ratio: 4.9 (calc) (ref <5.0)
Triglycerides: 284 mg/dL — ABNORMAL HIGH (ref <150)

## 2021-06-06 ENCOUNTER — Other Ambulatory Visit: Payer: Self-pay | Admitting: Family Medicine

## 2021-06-06 DIAGNOSIS — R7989 Other specified abnormal findings of blood chemistry: Secondary | ICD-10-CM

## 2021-06-07 ENCOUNTER — Ambulatory Visit: Payer: No Typology Code available for payment source | Attending: Internal Medicine

## 2021-06-07 ENCOUNTER — Other Ambulatory Visit: Payer: Self-pay

## 2021-06-07 ENCOUNTER — Encounter: Payer: Self-pay | Admitting: *Deleted

## 2021-06-07 DIAGNOSIS — Z23 Encounter for immunization: Secondary | ICD-10-CM

## 2021-06-07 MED ORDER — PFIZER-BIONT COVID-19 VAC-TRIS 30 MCG/0.3ML IM SUSP
INTRAMUSCULAR | 0 refills | Status: DC
Start: 2021-06-07 — End: 2022-04-08
  Filled 2021-06-07: qty 0.3, 10d supply, fill #0

## 2021-06-07 NOTE — Progress Notes (Signed)
   Covid-19 Vaccination Clinic  Name:  Daniel Calderon    MRN: [id number] DOB: 10-24-1966  06/07/2021  Mr. Mccaslin was observed post Covid-19 immunization for 15 minutes without incident. He was provided with Vaccine Information Sheet and instruction to access the V-Safe system.   Mr. Schroeter was instructed to call 911 with any severe reactions post vaccine: Difficulty breathing  Swelling of face and throat  A fast heartbeat  A bad rash all over body  Dizziness and weakness   Immunizations Administered     Name Date Dose VIS Date Route   PFIZER Comrnaty(Gray TOP) Covid-19 Vaccine 06/07/2021  1:04 PM 0.3 mL 11/29/2020 Intramuscular   Manufacturer: [employer]   Lot: [id number]   NDC: [place], PharmD, MBA Clinical Acute Care Pharmacist

## 2021-06-09 ENCOUNTER — Telehealth: Payer: No Typology Code available for payment source | Admitting: Family

## 2021-06-09 DIAGNOSIS — J019 Acute sinusitis, unspecified: Secondary | ICD-10-CM | POA: Diagnosis not present

## 2021-06-09 MED ORDER — DOXYCYCLINE HYCLATE 100 MG PO TABS: 100.0000 mg | ORAL_TABLET | Freq: Two times a day (BID) | ORAL | 0 refills | Status: DC

## 2021-06-09 NOTE — Progress Notes (Signed)
We are sorry that you are not feeling well.  Here is how we plan to help!  Based on what you have shared with me it looks like you have sinusitis.  Sinusitis is inflammation and infection in the sinus cavities of the head.  Based on your presentation I believe you most likely have Acute Bacterial Sinusitis.  This is an infection caused by bacteria and is treated with antibiotics. I have prescribed Doxycycline 100mg  by mouth twice a day for 10 days. You may use an oral decongestant such as Mucinex D or if you have glaucoma or high blood pressure use plain Mucinex. Saline nasal spray help and can safely be used as often as needed for congestion.  If you develop worsening sinus pain, fever or notice severe headache and vision changes, or if symptoms are not better after completion of antibiotic, please schedule an appointment with a health care provider.    Sinus infections are not as easily transmitted as other respiratory infection, however we still recommend that you avoid close contact with loved ones, especially the very young and elderly.  Remember to wash your hands thoroughly throughout the day as this is the number one way to prevent the spread of infection!  Home Care: Only take medications as instructed by your medical team. Complete the entire course of an antibiotic. Do not take these medications with alcohol. A steam or ultrasonic humidifier can help congestion.  You can place a towel over your head and breathe in the steam from hot water coming from a faucet. Avoid close contacts especially the very young and the elderly. Cover your mouth when you cough or sneeze. Always remember to wash your hands.  Get Help Right Away If: You develop worsening fever or sinus pain. You develop a severe head ache or visual changes. Your symptoms persist after you have completed your treatment plan.  Make sure you Understand these instructions. Will watch your condition. Will get help right away if  you are not doing well or get worse.  Your e-visit answers were reviewed by a board certified advanced clinical practitioner to complete your personal care plan.  Depending on the condition, your plan could have included both over the counter or prescription medications.  If there is a problem please reply  once you have received a response from your provider.  Your safety is important to [name].  If you have drug allergies check your prescription carefully.    You can use MyChart to ask questions about today's visit, request a non-urgent call back, or ask for a work or school excuse for 24 hours related to this e-Visit. If it has been greater than 24 hours you will need to follow up with your provider, or enter a new e-Visit to address those concerns.  You will get an e-mail in the next two days asking about your experience.  I hope that your e-visit has been valuable and will speed your recovery. Thank you for using e-visits.  Approximately 5 minutes was spent documenting and reviewing patient's chart.

## 2021-06-11 ENCOUNTER — Other Ambulatory Visit (HOSPITAL_COMMUNITY): Payer: Self-pay

## 2021-06-13 ENCOUNTER — Other Ambulatory Visit (HOSPITAL_COMMUNITY): Payer: Self-pay

## 2021-06-16 ENCOUNTER — Other Ambulatory Visit: Payer: Self-pay | Admitting: Family Medicine

## 2021-06-16 MED FILL — Testosterone Cypionate IM Inj in Oil 200 MG/ML: INTRAMUSCULAR | 28 days supply | Qty: 2 | Fill #2 | Status: AC

## 2021-06-17 ENCOUNTER — Other Ambulatory Visit (HOSPITAL_COMMUNITY): Payer: Self-pay

## 2021-06-17 ENCOUNTER — Other Ambulatory Visit: Payer: Self-pay

## 2021-06-17 MED ORDER — OMEPRAZOLE 20 MG PO CPDR
DELAYED_RELEASE_CAPSULE | Freq: Every day | ORAL | 2 refills | Status: DC
  Filled 2021-06-17: qty 30, 30d supply, fill #0
  Filled 2021-07-18: qty 30, 30d supply, fill #1
  Filled 2021-08-19: qty 30, 30d supply, fill #2

## 2021-06-17 MED ORDER — ALPRAZOLAM 0.5 MG PO TABS
ORAL_TABLET | ORAL | 0 refills | Status: DC
  Filled 2021-06-17: qty 1, 1d supply, fill #0

## 2021-06-20 ENCOUNTER — Other Ambulatory Visit: Payer: Self-pay

## 2021-07-09 ENCOUNTER — Other Ambulatory Visit: Payer: Self-pay

## 2021-07-09 MED ORDER — ALPRAZOLAM 0.5 MG PO TABS
ORAL_TABLET | ORAL | 0 refills | Status: DC
  Filled 2021-07-09: qty 1, 1d supply, fill #0

## 2021-07-10 ENCOUNTER — Other Ambulatory Visit: Payer: Self-pay

## 2021-07-16 ENCOUNTER — Other Ambulatory Visit (HOSPITAL_COMMUNITY): Payer: Self-pay

## 2021-07-16 MED FILL — Apremilast Tab 30 MG: ORAL | 30 days supply | Qty: 60 | Fill #1 | Status: AC

## 2021-07-18 MED FILL — Amlodipine Besylate Tab 10 MG (Base Equivalent): ORAL | 90 days supply | Qty: 90 | Fill #1 | Status: AC

## 2021-07-19 ENCOUNTER — Other Ambulatory Visit: Payer: Self-pay

## 2021-07-22 ENCOUNTER — Other Ambulatory Visit (HOSPITAL_COMMUNITY): Payer: Self-pay

## 2021-08-15 ENCOUNTER — Other Ambulatory Visit (HOSPITAL_COMMUNITY): Payer: Self-pay

## 2021-08-15 ENCOUNTER — Other Ambulatory Visit: Payer: Self-pay

## 2021-08-15 MED FILL — Testosterone Cypionate IM Inj in Oil 200 MG/ML: INTRAMUSCULAR | 28 days supply | Qty: 2 | Fill #3 | Status: AC

## 2021-08-15 MED FILL — Apremilast Tab 30 MG: ORAL | 30 days supply | Qty: 60 | Fill #2 | Status: CN

## 2021-08-19 ENCOUNTER — Other Ambulatory Visit: Payer: Self-pay

## 2021-08-22 ENCOUNTER — Other Ambulatory Visit (HOSPITAL_COMMUNITY): Payer: Self-pay

## 2021-09-16 ENCOUNTER — Ambulatory Visit: Payer: Self-pay | Admitting: Urology

## 2021-09-17 NOTE — Progress Notes (Deleted)
09/18/2021 4:34 PM   Daniel Calderon [birth year] [id number]  Referring provider: [name], MD [address] [place],  [place] [address]  No chief complaint on file.   Urologic history: 1.  Hypogonadism TRT  HPI: 55 y.o. male presents for annual follow-up.  Switched from AndroGel to testosterone cypionate at last years visit Injecting 200 mg every 2 weeks Testosterone level January 2022 was 718 with repeat March 2022 680 and hematocrit 42.3 No bothersome LUTS, no breast tenderness/enlargement     PMH: Past Medical History:  Diagnosis Date   Allergy    Seasonal   Anxiety    Benign enlargement of prostate    Depression    Elevated BP    Elevated transaminase level    Failure of erection    Fatigue    Frequent headaches    GERD (gastroesophageal reflux disease)    History of kidney stones    Hypertension    Hypogonadism in male    Migraine    Sinus congestion 09/01/2017   Sleep apnea    Currently uses cpap   Thoracic outlet syndrome    Left Arm   Tongue pain 02/09/2019    Surgical History: Past Surgical History:  Procedure Laterality Date   COLONOSCOPY WITH PROPOFOL N/A 11/11/2017   Procedure: COLONOSCOPY WITH PROPOFOL;  Surgeon: Lin Landsman, MD;  Location: [facility];  Service: Endoscopy;  Laterality: N/A;   ESOPHAGOGASTRODUODENOSCOPY (EGD) WITH PROPOFOL N/A 08/25/2019   Procedure: ESOPHAGOGASTRODUODENOSCOPY (EGD) WITH PROPOFOL;  Surgeon: Lin Landsman, MD;  Location: [facility];  Service: Gastroenterology;  Laterality: N/A;   KNEE ARTHROSCOPY WITH MEDIAL MENISECTOMY Left 07/26/2018   Procedure: KNEE ARTHROSCOPY WITH PARTIAL MEDIAL MENISECTOMY;  Surgeon: Leim Fabry, MD;  Location: [facility];  Service: Orthopedics;  Laterality: Left;   Left Shoulder Surgery  2011   nerve block     2 in neck. 5 in back.   NOSE SURGERY     POLYPECTOMY  11/11/2017   Procedure: POLYPECTOMY;  Surgeon: Lin Landsman, MD;   Location: [facility];  Service: Endoscopy;;   SCALENE NODE BIOPSY / EXCISION     VASECTOMY      Home Medications:  Allergies as of 09/18/2021       Reactions   Erythromycin Nausea And Vomiting   Cephalexin Other (See Comments)   Unknown   Dexamethasone Other (See Comments)   Unknown   Oxycodone-acetaminophen Other (See Comments)   Unknown   Prednisone Other (See Comments)   Unknown   Gabapentin Other (See Comments)   Aggressive   Ibuprofen Itching, Other (See Comments)   Can take in low doses per pt        Medication List        Accurate as of September 17, 2021  4:34 PM. If you have any questions, ask your nurse or doctor.          ALPRAZolam 0.5 MG tablet Commonly known as: XANAX Take 1 tablet 45 minutes prior to procedure as needed for  anxiety.  Must have driver while on this medication.   ALPRAZolam 0.5 MG tablet Commonly known as: XANAX Take 1 tablet p.o. as needed anxiety 45 minutes prior to injection.  Must have driver while on this medication   amLODipine 10 MG tablet Commonly known as: NORVASC TAKE 1 TABLET BY MOUTH DAILY.   atorvastatin 40 MG tablet Commonly known as: LIPITOR Take 1 tablet (40 mg total) by mouth daily at 6 PM.  doxycycline 100 MG capsule Commonly known as: MONODOX TAKE 1 CAPSULE BY MOUTH 2 TIMES DAILY. TAKE WITH FOOD AND DRINK   doxycycline 100 MG tablet Commonly known as: VIBRA-TABS Take 1 tablet (100 mg total) by mouth 2 (two) times daily.   ibuprofen 600 MG tablet Commonly known as: ADVIL Take 1 tablet (600 mg total) by mouth every 6 (six) hours as needed.   multivitamin tablet Take 1 tablet by mouth daily.   mupirocin ointment 2 % Commonly known as: BACTROBAN APPLY 1 APPLICATION TOPICALLY 2 TIMES DAILY.   nortriptyline 10 MG capsule Commonly known as: PAMELOR Take 10 mg by mouth at bedtime.   nortriptyline 10 MG capsule Commonly known as: PAMELOR TAKE 1 CAPSULE BY MOUTH NIGHTLY FOR 1 WEEK, THEN  INCREASE TO 2 CAPSULES NIGHTLY   omeprazole 20 MG capsule Commonly known as: PRILOSEC Take one capsule by mouth daily.   Otezla 30 MG Tabs Generic drug: Apremilast TAKE 1 TABLET (30 MG TOTAL) BY MOUTH 2 (TWO) TIMES DAILY.   Pfizer-BioNT COVID-19 Vac-TriS Susp injection Generic drug: COVID-19 mRNA Vac-TriS (Pfizer) Inject into the muscle.   sertraline 100 MG tablet Commonly known as: ZOLOFT TAKE 1&1/2 TABLETS (150 MG TOTAL) BY MOUTH ONCE DAILY   sertraline 100 MG tablet Commonly known as: ZOLOFT Take 1 tablet (100 mg total) by mouth daily.   testosterone cypionate 200 MG/ML injection Commonly known as: DEPOTESTOSTERONE CYPIONATE INJECT 1 ML (200 MG TOTAL) INTO THE MUSCLE EVERY 14 (FOURTEEN) DAYS.        Allergies:  Allergies  Allergen Reactions   Erythromycin Nausea And Vomiting   Cephalexin Other (See Comments)    Unknown   Dexamethasone Other (See Comments)    Unknown   Oxycodone-Acetaminophen Other (See Comments)    Unknown   Prednisone Other (See Comments)    Unknown   Gabapentin Other (See Comments)    Aggressive   Ibuprofen Itching and Other (See Comments)    Can take in low doses per pt    Family History: Family History  Problem Relation Age of Onset   Hypertension Mother        Living   Hearing loss Mother    Heart disease Other    Healthy Father        Living   Diabetes Brother    Stroke Paternal Uncle    Heart attack Paternal Uncle    Hearing loss Maternal Grandmother    Healthy Son    Healthy Daughter    Kidney cancer Neg Hx    Bladder Cancer Neg Hx    Prostate cancer Neg Hx     Social History:  reports that he has never smoked. He has never used smokeless tobacco. He reports current alcohol use of about 3.0 standard drinks per week. He reports that he does not use drugs.   Physical Exam: There were no vitals taken for this visit.  Constitutional:  Alert and oriented, No acute distress. HEENT: [place] AT, moist mucus membranes.  Trachea  midline, no masses. Cardiovascular: No clubbing, cyanosis, or edema. Respiratory: Normal respiratory effort, no increased work of breathing. GI: Abdomen is soft, nontender, nondistended, no abdominal masses GU: No CVA tenderness Lymph: No cervical or inguinal lymphadenopathy. Skin: No rashes, bruises or suspicious lesions. Neurologic: Grossly intact, no focal deficits, moving all 4 extremities. Psychiatric: Normal mood and affect.  Laboratory Data: Lab Results  Component Value Date   WBC 8.6 04/25/2020   HGB 15.4 03/18/2021   HCT 42.3 03/18/2021  MCV 85.7 04/25/2020   PLT 201 04/25/2020    Lab Results  Component Value Date   CREATININE 1.26 05/24/2021    No results found for: PSA  Lab Results  Component Value Date   TESTOSTERONE 680 03/18/2021    Lab Results  Component Value Date   HGBA1C 5.6 12/07/2019    Urinalysis    Component Value Date/Time   COLORURINE YELLOW (A) 02/19/2017 1020   APPEARANCEUR CLEAR (A) 02/19/2017 1020   LABSPEC 1.018 02/19/2017 1020   PHURINE 6.0 02/19/2017 1020   GLUCOSEU NEGATIVE 02/19/2017 1020   HGBUR NEGATIVE 02/19/2017 1020   BILIRUBINUR negative 08/15/2019 1527   KETONESUR NEGATIVE 02/19/2017 1020   PROTEINUR Negative 08/15/2019 1527   PROTEINUR NEGATIVE 02/19/2017 1020   UROBILINOGEN 0.2 08/15/2019 1527   NITRITE negative 08/15/2019 1527   NITRITE NEGATIVE 02/19/2017 1020   LEUKOCYTESUR Negative 08/15/2019 1527    No results found for: LABMICR, WBCUA, RBCUA, LABEPIT, MUCUS, BACTERIA  Pertinent Imaging: *** No results found for this or any previous visit.  No results found for this or any previous visit.  No results found for this or any previous visit.  No results found for this or any previous visit.  No results found for this or any previous visit.  No results found for this or any previous visit.  No results found for this or any previous visit.  Results for orders placed during the hospital encounter of  08/17/19  CT RENAL STONE STUDY  Narrative CLINICAL DATA:  Left flank pain  EXAM: CT ABDOMEN AND PELVIS WITHOUT CONTRAST  TECHNIQUE: Multidetector CT imaging of the abdomen and pelvis was performed following the standard protocol without IV contrast.  COMPARISON:  09/08/2009  FINDINGS: Lower chest: No acute abnormality.  Hepatobiliary: No solid liver abnormality is seen. Hepatic steatosis. No gallstones, gallbladder wall thickening, or biliary dilatation.  Pancreas: Unremarkable. No pancreatic ductal dilatation or surrounding inflammatory changes.  Spleen: Normal in size without significant abnormality.  Adrenals/Urinary Tract: Adrenal glands are unremarkable. There are mildly prominent bilateral extrarenal pelves, unchanged in appearance when compared to remote prior examination dated 09/08/2009. Kidneys are normal, without renal calculi, solid lesion, or hydronephrosis. Bladder is unremarkable.  Stomach/Bowel: Stomach is within normal limits. Appendix appears normal. No evidence of bowel wall thickening, distention, or inflammatory changes.  Vascular/Lymphatic: No significant vascular findings are present. No enlarged abdominal or pelvic lymph nodes.  Reproductive: No mass or other significant abnormality.  Other: No abdominal wall hernia or abnormality. No abdominopelvic ascites.  Musculoskeletal: No acute or significant osseous findings.  IMPRESSION: 1. There are mildly prominent bilateral extrarenal pelves, unchanged in appearance when compared to remote prior examination dated 09/08/2009 and of doubtful significance. No evidence of urinary tract calculus or hydronephrosis.  2.  Hepatic steatosis.  These results will be called to the ordering clinician or representative by the Radiologist Assistant, and communication documented in the PACS or zVision Dashboard.   Electronically Signed By: Eddie Candle M.D. On: 08/17/2019 10:59   Assessment & Plan:     There are no diagnoses linked to this encounter.  No follow-ups on file.  Abbie Sons, [place] [address], [place], [place] [address] [phone]

## 2021-09-18 ENCOUNTER — Other Ambulatory Visit (HOSPITAL_COMMUNITY): Payer: Self-pay

## 2021-09-18 ENCOUNTER — Ambulatory Visit: Payer: Self-pay | Admitting: Urology

## 2021-09-23 ENCOUNTER — Encounter: Payer: Self-pay | Admitting: Urology

## 2021-09-23 ENCOUNTER — Other Ambulatory Visit (HOSPITAL_COMMUNITY): Payer: Self-pay

## 2021-09-27 ENCOUNTER — Other Ambulatory Visit: Payer: Self-pay | Admitting: Family Medicine

## 2021-09-27 ENCOUNTER — Other Ambulatory Visit: Payer: Self-pay

## 2021-09-30 ENCOUNTER — Other Ambulatory Visit: Payer: Self-pay

## 2021-09-30 MED FILL — Omeprazole Cap Delayed Release 20 MG: ORAL | 30 days supply | Qty: 30 | Fill #0 | Status: AC

## 2021-10-07 ENCOUNTER — Other Ambulatory Visit: Payer: Self-pay

## 2021-10-24 ENCOUNTER — Other Ambulatory Visit: Payer: Self-pay

## 2021-10-24 MED FILL — Amlodipine Besylate Tab 10 MG (Base Equivalent): ORAL | 90 days supply | Qty: 90 | Fill #2 | Status: AC

## 2021-10-24 MED FILL — Omeprazole Cap Delayed Release 20 MG: ORAL | 30 days supply | Qty: 30 | Fill #1 | Status: AC

## 2021-11-08 ENCOUNTER — Other Ambulatory Visit (HOSPITAL_COMMUNITY): Payer: Self-pay

## 2021-11-08 MED FILL — Apremilast Tab 30 MG: ORAL | 30 days supply | Qty: 60 | Fill #2 | Status: CN

## 2021-11-12 ENCOUNTER — Other Ambulatory Visit (HOSPITAL_COMMUNITY): Payer: Self-pay

## 2021-11-18 ENCOUNTER — Other Ambulatory Visit (HOSPITAL_COMMUNITY): Payer: Self-pay

## 2021-11-18 MED FILL — Apremilast Tab 30 MG: ORAL | 30 days supply | Qty: 60 | Fill #2 | Status: CN

## 2021-11-19 ENCOUNTER — Other Ambulatory Visit (HOSPITAL_COMMUNITY): Payer: Self-pay

## 2021-11-20 ENCOUNTER — Other Ambulatory Visit (HOSPITAL_COMMUNITY): Payer: Self-pay

## 2021-11-26 ENCOUNTER — Other Ambulatory Visit: Payer: Self-pay | Admitting: Family Medicine

## 2021-11-26 ENCOUNTER — Other Ambulatory Visit (HOSPITAL_COMMUNITY): Payer: Self-pay

## 2021-11-26 ENCOUNTER — Other Ambulatory Visit: Payer: Self-pay

## 2021-11-26 MED ORDER — SERTRALINE HCL 100 MG PO TABS
ORAL_TABLET | ORAL | 3 refills | Status: DC
  Filled 2021-11-26: qty 45, 30d supply, fill #0
  Filled 2022-01-09: qty 45, 30d supply, fill #1
  Filled 2022-02-24: qty 45, 30d supply, fill #2
  Filled 2022-04-10: qty 45, 30d supply, fill #3

## 2021-11-28 ENCOUNTER — Other Ambulatory Visit (HOSPITAL_COMMUNITY): Payer: Self-pay

## 2021-11-28 MED FILL — Apremilast Tab 30 MG: ORAL | 30 days supply | Qty: 60 | Fill #2 | Status: CN

## 2021-11-29 ENCOUNTER — Other Ambulatory Visit (HOSPITAL_COMMUNITY): Payer: Self-pay

## 2021-12-03 ENCOUNTER — Other Ambulatory Visit: Payer: Self-pay

## 2021-12-03 MED FILL — Omeprazole Cap Delayed Release 20 MG: ORAL | 30 days supply | Qty: 30 | Fill #2 | Status: AC

## 2021-12-29 ENCOUNTER — Telehealth: Payer: No Typology Code available for payment source | Admitting: Nurse Practitioner

## 2021-12-29 DIAGNOSIS — R6889 Other general symptoms and signs: Secondary | ICD-10-CM | POA: Diagnosis not present

## 2021-12-29 MED ORDER — OSELTAMIVIR PHOSPHATE 75 MG PO CAPS: 75.0000 mg | ORAL_CAPSULE | Freq: Two times a day (BID) | ORAL | 0 refills | Status: AC

## 2021-12-29 NOTE — Progress Notes (Signed)
I have spent 5 minutes in review of e-visit questionnaire, review and updating patient chart, medical decision making and response to patient.  ° °Daniel Calderon Daniel Boden Stucky, NP ° °  °

## 2021-12-29 NOTE — Progress Notes (Signed)

## 2021-12-30 ENCOUNTER — Other Ambulatory Visit (HOSPITAL_COMMUNITY): Payer: Self-pay

## 2021-12-30 MED FILL — Apremilast Tab 30 MG: ORAL | 30 days supply | Qty: 60 | Fill #2 | Status: AC

## 2021-12-30 MED FILL — Apremilast Tab 30 MG: ORAL | 30 days supply | Qty: 60 | Fill #2 | Status: CN

## 2022-01-01 ENCOUNTER — Other Ambulatory Visit: Payer: Self-pay

## 2022-01-01 ENCOUNTER — Telehealth: Payer: No Typology Code available for payment source | Admitting: Nurse Practitioner

## 2022-01-01 DIAGNOSIS — J069 Acute upper respiratory infection, unspecified: Secondary | ICD-10-CM

## 2022-01-01 MED ORDER — BENZONATATE 100 MG PO CAPS
100.0000 mg | ORAL_CAPSULE | Freq: Three times a day (TID) | ORAL | 0 refills | Status: DC | PRN
  Filled 2022-01-01: qty 20, 7d supply, fill #0

## 2022-01-01 NOTE — Progress Notes (Signed)
We are sorry that you are not feeling well.  Here is how we plan to help!  Based on your presentation I believe you most likely have A cough due to a virus.  This is called viral bronchitis and is best treated by rest, plenty of fluids and control of the cough.  You may use Ibuprofen or Tylenol as directed to help your symptoms.     In addition you may use A prescription cough medication called Tessalon Perles 100mg. You may take 1-2 capsules every 8 hours as needed for your cough.   From your responses in the eVisit questionnaire you describe inflammation in the upper respiratory tract which is causing a significant cough.  This is commonly called Bronchitis and has four common causes:   Allergies Viral Infections Acid Reflux Bacterial Infection Allergies, viruses and acid reflux are treated by controlling symptoms or eliminating the cause. An example might be a cough caused by taking certain blood pressure medications. You stop the cough by changing the medication. Another example might be a cough caused by acid reflux. Controlling the reflux helps control the cough.  USE OF BRONCHODILATOR ("RESCUE") INHALERS: There is a risk from using your bronchodilator too frequently.  The risk is that over-reliance on a medication which only relaxes the muscles surrounding the breathing tubes can reduce the effectiveness of medications prescribed to reduce swelling and congestion of the tubes themselves.  Although you feel brief relief from the bronchodilator inhaler, your asthma may actually be worsening with the tubes becoming more swollen and filled with mucus.  This can delay other crucial treatments, such as oral steroid medications. If you need to use a bronchodilator inhaler daily, several times per day, you should discuss this with your provider.  There are probably better treatments that could be used to keep your asthma under control.     HOME CARE Only take medications as instructed by your  medical team. Complete the entire course of an antibiotic. Drink plenty of fluids and get plenty of rest. Avoid close contacts especially the very young and the elderly Cover your mouth if you cough or cough into your sleeve. Always remember to wash your hands A steam or ultrasonic humidifier can help congestion.   GET HELP RIGHT AWAY IF: You develop worsening fever. You become short of breath You cough up blood. Your symptoms persist after you have completed your treatment plan MAKE SURE YOU  Understand these instructions. Will watch your condition. Will get help right away if you are not doing well or get worse.    Thank you for choosing an e-visit.  Your e-visit answers were reviewed by a board certified advanced clinical practitioner to complete your personal care plan. Depending upon the condition, your plan could have included both over the counter or prescription medications.  Please review your pharmacy choice. Make sure the pharmacy is open so you can pick up prescription now. If there is a problem, you may contact your provider through MyChart messaging and have the prescription routed to another pharmacy.  Your safety is important to us. If you have drug allergies check your prescription carefully.   For the next 24 hours you can use MyChart to ask questions about today's visit, request a non-urgent call back, or ask for a work or school excuse. You will get an email in the next two days asking about your experience. I hope that your e-visit has been valuable and will speed your recovery.  5-10 minutes spent reviewing   and documenting in chart.  

## 2022-01-03 ENCOUNTER — Telehealth: Payer: No Typology Code available for payment source | Admitting: Physician Assistant

## 2022-01-03 DIAGNOSIS — B9689 Other specified bacterial agents as the cause of diseases classified elsewhere: Secondary | ICD-10-CM

## 2022-01-03 MED ORDER — DOXYCYCLINE HYCLATE 100 MG PO TABS: 100.0000 mg | ORAL_TABLET | Freq: Two times a day (BID) | ORAL | 0 refills | Status: DC

## 2022-01-03 NOTE — Telephone Encounter (Signed)
error 

## 2022-01-03 NOTE — Progress Notes (Signed)

## 2022-01-09 ENCOUNTER — Other Ambulatory Visit: Payer: Self-pay | Admitting: Family Medicine

## 2022-01-09 ENCOUNTER — Other Ambulatory Visit: Payer: Self-pay

## 2022-01-09 MED ORDER — OMEPRAZOLE 20 MG PO CPDR
DELAYED_RELEASE_CAPSULE | Freq: Every day | ORAL | 1 refills | Status: DC
  Filled 2022-01-09: qty 90, 90d supply, fill #0
  Filled 2022-03-28: qty 90, 90d supply, fill #1

## 2022-01-10 ENCOUNTER — Other Ambulatory Visit: Payer: Self-pay

## 2022-01-23 ENCOUNTER — Other Ambulatory Visit (HOSPITAL_COMMUNITY): Payer: Self-pay

## 2022-01-30 ENCOUNTER — Other Ambulatory Visit: Payer: Self-pay | Admitting: Family Medicine

## 2022-01-30 ENCOUNTER — Other Ambulatory Visit: Payer: Self-pay

## 2022-01-30 ENCOUNTER — Other Ambulatory Visit (HOSPITAL_COMMUNITY): Payer: Self-pay

## 2022-01-30 MED ORDER — AMLODIPINE BESYLATE 10 MG PO TABS
ORAL_TABLET | Freq: Every day | ORAL | 2 refills | Status: DC
  Filled 2022-01-30: qty 90, 90d supply, fill #0
  Filled 2022-05-06: qty 90, 90d supply, fill #1
  Filled 2022-08-14: qty 90, 90d supply, fill #2

## 2022-02-18 ENCOUNTER — Other Ambulatory Visit (HOSPITAL_COMMUNITY): Payer: Self-pay

## 2022-02-18 MED FILL — Apremilast Tab 30 MG: ORAL | 30 days supply | Qty: 60 | Fill #3 | Status: CN

## 2022-02-19 ENCOUNTER — Telehealth: Payer: Self-pay | Admitting: Pharmacist

## 2022-02-19 ENCOUNTER — Other Ambulatory Visit (HOSPITAL_COMMUNITY): Payer: Self-pay

## 2022-02-19 NOTE — Telephone Encounter (Signed)
Called patient to schedule an appointment for the [facility]. I was unable to reach the patient so I left a HIPAA-compliant message requesting that the patient return my call.   Luke Van Ausdall, PharmD, BCACP, CPP Clinical Pharmacist [facility] [phone]  

## 2022-02-24 ENCOUNTER — Other Ambulatory Visit: Payer: Self-pay

## 2022-03-14 ENCOUNTER — Other Ambulatory Visit (HOSPITAL_COMMUNITY): Payer: Self-pay

## 2022-03-22 ENCOUNTER — Other Ambulatory Visit (HOSPITAL_COMMUNITY): Payer: Self-pay

## 2022-03-28 ENCOUNTER — Other Ambulatory Visit: Payer: Self-pay

## 2022-04-03 ENCOUNTER — Other Ambulatory Visit (HOSPITAL_COMMUNITY): Payer: Self-pay

## 2022-04-08 ENCOUNTER — Encounter: Payer: Self-pay | Admitting: Family Medicine

## 2022-04-08 ENCOUNTER — Ambulatory Visit (INDEPENDENT_AMBULATORY_CARE_PROVIDER_SITE_OTHER): Payer: No Typology Code available for payment source | Admitting: Family Medicine

## 2022-04-08 DIAGNOSIS — E291 Testicular hypofunction: Secondary | ICD-10-CM | POA: Diagnosis not present

## 2022-04-08 DIAGNOSIS — M25551 Pain in right hip: Secondary | ICD-10-CM

## 2022-04-08 DIAGNOSIS — F4323 Adjustment disorder with mixed anxiety and depressed mood: Secondary | ICD-10-CM | POA: Diagnosis not present

## 2022-04-08 DIAGNOSIS — R110 Nausea: Secondary | ICD-10-CM

## 2022-04-08 DIAGNOSIS — R251 Tremor, unspecified: Secondary | ICD-10-CM | POA: Diagnosis not present

## 2022-04-08 DIAGNOSIS — G8929 Other chronic pain: Secondary | ICD-10-CM

## 2022-04-08 LAB — COMPREHENSIVE METABOLIC PANEL
ALT: 94 U/L — ABNORMAL HIGH (ref 0–53)
AST: 50 U/L — ABNORMAL HIGH (ref 0–37)
Albumin: 4.9 g/dL (ref 3.5–5.2)
Alkaline Phosphatase: 88 U/L (ref 39–117)
BUN: 13 mg/dL (ref 6–23)
CO2: 27 mEq/L (ref 19–32)
Calcium: 9.4 mg/dL (ref 8.4–10.5)
Chloride: 105 mEq/L (ref 96–112)
Creatinine, Ser: 0.92 mg/dL (ref 0.40–1.50)
GFR: 94.92 mL/min (ref >60.00)
Glucose, Bld: 122 mg/dL — ABNORMAL HIGH (ref 70–99)
Potassium: 3.7 mEq/L (ref 3.5–5.1)
Sodium: 139 mEq/L (ref 135–145)
Total Bilirubin: 0.8 mg/dL (ref 0.2–1.2)
Total Protein: 6.8 g/dL (ref 6.0–8.3)

## 2022-04-08 LAB — CBC WITH DIFFERENTIAL/PLATELET
Basophils Absolute: 0.1 10*3/uL (ref 0.0–0.1)
Basophils Relative: 1 % (ref 0.0–3.0)
Eosinophils Absolute: 0.4 10*3/uL (ref 0.0–0.7)
Eosinophils Relative: 5.4 % — ABNORMAL HIGH (ref 0.0–5.0)
HCT: 43 % (ref 39.0–52.0)
Hemoglobin: 15.2 g/dL (ref 13.0–17.0)
Lymphocytes Relative: 19.1 % (ref 12.0–46.0)
Lymphs Abs: 1.3 10*3/uL (ref 0.7–4.0)
MCHC: 35.4 g/dL (ref 30.0–36.0)
MCV: 89 fl (ref 78.0–100.0)
Monocytes Absolute: 0.4 10*3/uL (ref 0.1–1.0)
Monocytes Relative: 5.5 % (ref 3.0–12.0)
Neutro Abs: 4.9 10*3/uL (ref 1.4–7.7)
Neutrophils Relative %: 69 % (ref 43.0–77.0)
Platelets: 146 10*3/uL — ABNORMAL LOW (ref 150.0–400.0)
RBC: 4.83 Mil/uL (ref 4.22–5.81)
RDW: 13.4 % (ref 11.5–15.5)
WBC: 7 10*3/uL (ref 4.0–10.5)

## 2022-04-08 LAB — TSH: TSH: 1.53 u[IU]/mL (ref 0.35–5.50)

## 2022-04-08 NOTE — Assessment & Plan Note (Signed)
We will refer to a different urologist at the patient's request. ?

## 2022-04-08 NOTE — Assessment & Plan Note (Signed)
Undetermined cause.  This could be essential tremor given his reported family history.  He is neurologically intact.  We will check lab work as outlined. ?

## 2022-04-08 NOTE — Assessment & Plan Note (Signed)
Patient with ongoing improving nausea over the last few weeks.  Certainly this could have been related to what ever exposure he had though could be related to some other cause.  I encouraged him to continue to monitor.  If it does not resolve we will need to consider referral for further evaluation.  We will get lab work today to evaluate for underlying cause. ?

## 2022-04-08 NOTE — Assessment & Plan Note (Signed)
Ongoing issue.  Discussed icing.  If not improving over the next 1 to 2 weeks with icing he will let us know we can refer to sports medicine or orthopedics. ?

## 2022-04-08 NOTE — Assessment & Plan Note (Signed)
Well-controlled.  Patient will continue Zoloft 150 mg once daily.  He is also on nortriptyline 20 mg nightly. ?
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
## 2022-04-08 NOTE — Patient Instructions (Signed)
Nice to see you. ?Urology should contact you to schedule an appointment.  If you do not hear from them within 2 weeks please let us know. ?We will get lab work today and contact you with the results. ?Please ice the area of your right hip that is painful.  You can ice this for 10 minutes at a time 3 times daily.  If this is not improving the next 1 to 2 weeks please let us know. ?

## 2022-04-10 ENCOUNTER — Other Ambulatory Visit: Payer: Self-pay

## 2022-04-15 ENCOUNTER — Other Ambulatory Visit: Payer: Self-pay

## 2022-04-15 DIAGNOSIS — R7989 Other specified abnormal findings of blood chemistry: Secondary | ICD-10-CM

## 2022-05-01 ENCOUNTER — Ambulatory Visit: Payer: No Typology Code available for payment source | Admitting: Pulmonary Disease

## 2022-05-01 ENCOUNTER — Encounter: Payer: Self-pay | Admitting: Pulmonary Disease

## 2022-05-01 ENCOUNTER — Other Ambulatory Visit
Admission: RE | Admit: 2022-05-01 | Discharge: 2022-05-01 | Disposition: A | Discharge status: Home / Self Care | Payer: No Typology Code available for payment source | Source: Ambulatory Visit | Attending: Pulmonary Disease | Admitting: Pulmonary Disease

## 2022-05-01 VITALS — BP 120/82 | HR 85 | Temp 97.7°F | Ht 74.0 in | Wt 218.0 lb

## 2022-05-01 DIAGNOSIS — G4731 Primary central sleep apnea: Secondary | ICD-10-CM

## 2022-05-01 DIAGNOSIS — D721 Eosinophilia, unspecified: Secondary | ICD-10-CM | POA: Diagnosis not present

## 2022-05-01 DIAGNOSIS — G7000 Myasthenia gravis without (acute) exacerbation: Secondary | ICD-10-CM | POA: Diagnosis present

## 2022-05-01 DIAGNOSIS — M6281 Muscle weakness (generalized): Secondary | ICD-10-CM

## 2022-05-01 DIAGNOSIS — R0602 Shortness of breath: Secondary | ICD-10-CM

## 2022-05-01 DIAGNOSIS — G4739 Other sleep apnea: Secondary | ICD-10-CM

## 2022-05-01 NOTE — Progress Notes (Signed)
? ?Subjective:  ? ? Patient ID: Daniel Calderon, male    DOB: 10-29-1966, 56 y.o.   MRN: [id number] ?Patient Care Team: ?[name], MD as PCP - General (Family Medicine) ?Pieter Partridge, DO as Consulting Physician (Neurology) ? ?Chief Complaint  ?Patient presents with  ? Follow-up  ?  Gasping for air during the day, chest pressure, prod cough unsure of color and wheezing.   ? ?HPI ?The patient is a very complex 56 year old lifelong never smoker prior patient of Dr. Mathis Fare last seen on 18 May 2019 who presents for reassessment of dyspnea expressed as "gasping for air" during the day.  Patient's primary care physician is Dr. Tommi Rumps.  Sleep seeing Dr. Ashby Dawes for dyspnea on exertion.  In 2020 his PFTs were normal.  He was noted to have sleep apnea that was noted on Dr. Mathis Fare note to be obstructive however on review of all of the initial sleep study it shows that he has a predominant central apnea component.  It was classified as severe COMPLEX sleep apnea.  He has been compliant with CPAP at 8 cm water pressure since being diagnosed in 2020 however even the titration study at that time showed that this did not abolish the centrals and actually at this point may be worsening this issue he does noted fatigue during the day and has significant "brain fog".  He notes that during the day he catches himself "not breathing".  He also notes dyspnea on exertion particularly when going up stairs.  In addition he notes that his legs feel like they are made "out of lead" when he exerts himself.  He has been evaluated by cardiology and deemed not to have any back issue for his dyspnea.  However his last echocardiogram was in 2020 and did not show some diastolic dysfunction and most recent CT scan performed in May 2021 there was suggestion of potential pulmonary hypertension by increased diameter of the pulmonary artery trunk.  He has had extensive imaging to include MRI of the brain angio of  the head and neck all without any significant abnormality.  He has had issues with this for approximately 6 to 7 years.  He feels chest tightness but not frank pain and the inability to catch of breath during the day at times.  He says it feels like he is holding his breath and then once he is aware of this he will breathe.  Over the last 3 to 4 years she has noted excessive fatigue and again like his legs are "dead".  He has noted some generalized muscle weakness at times.  Notices tingling of the neck and arms particularly on the left and weakness on the left arm.  No loss of control of bowel or bladder he does not note any cough, wheezing, sputum production or hemoptysis.  No weight loss or anorexia.  He has been noticing occasional dysphagia associated with the episodes of apnea. ? ?He has a history of thoracic outlet syndrome on the left status post remote surgical intervention (rib sparing partial scalene resection).  He has left cervical radiculopathy for which she has had left C6 and C7 transforaminal epidural steroid injections. ? ? ?Review of Systems ?A 10 point review of systems was performed and it is as noted above otherwise negative. ? ?Past Medical History:  ?Diagnosis Date  ? Allergy   ? Seasonal  ? Anxiety   ? Benign enlargement of prostate   ? Depression   ? Elevated BP   ?  Elevated transaminase level   ? Failure of erection   ? Fatigue   ? Frequent headaches   ? GERD (gastroesophageal reflux disease)   ? History of kidney stones   ? Hypertension   ? Hypogonadism in male   ? Migraine   ? Sinus congestion 09/01/2017  ? Sleep apnea   ? Currently uses cpap  ? Thoracic outlet syndrome   ? Left Arm  ? Tongue pain 02/09/2019  ? ?Past Surgical History:  ?Procedure Laterality Date  ? COLONOSCOPY WITH PROPOFOL N/A 11/11/2017  ? Procedure: COLONOSCOPY WITH PROPOFOL;  Surgeon: Lin Landsman, MD;  Location: [place];  Service: Endoscopy;  Laterality: N/A;  ? ESOPHAGOGASTRODUODENOSCOPY (EGD) WITH  PROPOFOL N/A 08/25/2019  ? Procedure: ESOPHAGOGASTRODUODENOSCOPY (EGD) WITH PROPOFOL;  Surgeon: Lin Landsman, MD;  Location: [facility] ENDOSCOPY;  Service: Gastroenterology;  Laterality: N/A;  ? KNEE ARTHROSCOPY WITH MEDIAL MENISECTOMY Left 07/26/2018  ? Procedure: KNEE ARTHROSCOPY WITH PARTIAL MEDIAL MENISECTOMY;  Surgeon: Leim Fabry, MD;  Location: [facility];  Service: Orthopedics;  Laterality: Left;  ? Left Shoulder Surgery  2011  ? nerve block    ? 2 in neck. 5 in back.  ? NOSE SURGERY    ? POLYPECTOMY  11/11/2017  ? Procedure: POLYPECTOMY;  Surgeon: Lin Landsman, MD;  Location: [place];  Service: Endoscopy;;  ? SCALENE NODE BIOPSY / EXCISION    ? VASECTOMY    ? ?Patient Active Problem List  ? Diagnosis Date Noted  ? Nausea 04/08/2022  ? Tremor 04/08/2022  ? ASIS pain 04/08/2022  ? Left sided numbness 02/13/2020  ? Hyperlipidemia 12/07/2019  ? Radiculopathy, cervical and lumbar 12/06/2019  ? Abdominal pain, epigastric   ? Left flank pain 08/15/2019  ? Elevated LFTs 08/10/2019  ? Hypogammaglobulinemia ([place]) 05/17/2019  ? Excessive daytime sleepiness 03/28/2019  ? Cataplexy 03/28/2019  ? OSA on CPAP 03/28/2019  ? Acute pain of left knee 04/18/2018  ? Cutaneous skin tags 04/18/2018  ? Lower extremity edema 04/18/2018  ? Thoracic outlet syndrome 02/19/2018  ? Pain in limb 02/19/2018  ? Muscle strain 09/01/2017  ? Chest pain 03/21/2017  ? Dyspnea on exertion 02/19/2017  ? Language difficulty 02/19/2017  ? BPH with obstruction/lower urinary tract symptoms 12/03/2015  ? Medication refill 09/13/2015  ? Erectile disorder, generalized, mild 07/26/2015  ? Hypogonadism in male 05/24/2015  ? Environmental allergies 04/13/2015  ? Sleep apnea 04/13/2015  ? Migraines 04/13/2015  ? Neck pain 04/13/2015  ? Adjustment disorder with mixed anxiety and depressed mood 04/13/2015  ? Psoriasis 04/13/2015  ? History of kidney stones 04/13/2015  ? Neuropathy 04/13/2015  ? Benign fibroma of prostate 03/24/2015  ?  Hypertension 03/24/2015  ? Elevation of level of transaminase or lactic acid dehydrogenase (LDH) 03/24/2015  ? Fatigue 03/24/2015  ? ?Family History  ?Problem Relation Age of Onset  ? Hypertension Mother   ?     Living  ? Hearing loss Mother   ? Heart disease Other   ? Healthy Father   ?     Living  ? Diabetes Brother   ? Stroke Paternal Uncle   ? Heart attack Paternal Uncle   ? Hearing loss Maternal Grandmother   ? Healthy Son   ? Healthy Daughter   ? Kidney cancer Neg Hx   ? Bladder Cancer Neg Hx   ? Prostate cancer Neg Hx   ? ?Social History  ? ?Tobacco Use  ? Smoking status: Never  ? Smokeless tobacco: Never  ?Substance  Use Topics  ? Alcohol use: Yes  ?  Alcohol/week: 3.0 standard drinks  ?  Types: 3 Cans of beer per week  ?Works in Administrator, sports. at [employer] ? ?Allergies  ?Allergen Reactions  ? Erythromycin Nausea And Vomiting  ? Cephalexin Other (See Comments)  ?  Unknown  ? Dexamethasone Other (See Comments)  ?  Unknown  ? Oxycodone-Acetaminophen Other (See Comments)  ?  Unknown  ? Prednisone Other (See Comments)  ?  Unknown  ? Gabapentin Other (See Comments)  ?  Aggressive  ? Ibuprofen Itching and Other (See Comments)  ?  Can take in low doses per pt  ? ?Current Meds  ?Medication Sig  ? amLODipine (NORVASC) 10 MG tablet TAKE 1 TABLET BY MOUTH DAILY.  ? atorvastatin (LIPITOR) 40 MG tablet Take 1 tablet (40 mg total) by mouth daily at 6 PM.  ? ibuprofen (ADVIL) 600 MG tablet Take 1 tablet (600 mg total) by mouth every 6 (six) hours as needed.  ? Multiple Vitamin (MULTIVITAMIN) tablet Take 1 tablet by mouth daily.  ? nortriptyline (PAMELOR) 10 MG capsule Take 10 mg by mouth at bedtime.  ? omeprazole (PRILOSEC) 20 MG capsule Take one capsule by mouth daily.  ? sertraline (ZOLOFT) 100 MG tablet TAKE 1&1/2 TABLETS (150 MG TOTAL) BY MOUTH ONCE DAILY  ? [DISCONTINUED] benzonatate (TESSALON PERLES) 100 MG capsule Take 1 capsule (100 mg total) by mouth 3 (three) times daily as needed.  ? [DISCONTINUED] doxycycline  (VIBRA-TABS) 100 MG tablet Take 1 tablet (100 mg total) by mouth 2 (two) times daily.  ? ?Immunization History  ?Administered Date(s) Administered  ? Influenza Split 09/21/2014  ? Influenza-Unspecified 08/22/2016

## 2022-05-01 NOTE — Patient Instructions (Signed)
We are going to have to start "from scratch "  ? ?Your breathing test that will also check your muscle strength during the test. ? ?In some blood test to check for issues with muscle weakness and also with allergies as I did notice that your last blood count had high level of allergy cells in it. ? ?We are going to check an echocardiogram to make sure the pressures of the artery going from the heart to the lungs are controlled. ? ?We will repeat a sleep study as I want to see how you are central apnea is doing.  If you do have persistent issues with a central apnea we will have to consider a device such as REMEDE by [employer] but I will have to find out who implants it around here.  I suspect it may be [facility] or [facility]. ? ?We will see you in follow-up in 4 to 6 weeks time call sooner should any new problems arise. ? ? ?

## 2022-05-07 ENCOUNTER — Other Ambulatory Visit: Payer: Self-pay

## 2022-05-07 ENCOUNTER — Ambulatory Visit (INDEPENDENT_AMBULATORY_CARE_PROVIDER_SITE_OTHER): Payer: No Typology Code available for payment source

## 2022-05-07 DIAGNOSIS — R0602 Shortness of breath: Secondary | ICD-10-CM

## 2022-05-07 LAB — ECHOCARDIOGRAM COMPLETE
AR max vel: 2.72 cm2
AV Area VTI: 2.77 cm2
AV Area mean vel: 3.04 cm2
AV Mean grad: 4 mmHg
AV Peak grad: 8.1 mmHg
Ao pk vel: 1.42 m/s
Area-P 1/2: 3.54 cm2
Calc EF: 54 %
S' Lateral: 3.7 cm
Single Plane A2C EF: 50.5 %
Single Plane A4C EF: 57.2 %

## 2022-05-16 ENCOUNTER — Telehealth: Payer: Self-pay | Admitting: Primary Care

## 2022-05-16 NOTE — Telephone Encounter (Signed)
Lm for patient.  Sleep meds contact number is [phone]

## 2022-05-20 NOTE — Telephone Encounter (Signed)
Patient returned call. I have provided him with sleepmed's contact number.  He voiced his understanding and had no further questions.  Nothing further needed.

## 2022-05-20 NOTE — Telephone Encounter (Signed)
Lm x2 for patient.  Will close encounter per office protocol.   

## 2022-05-23 ENCOUNTER — Other Ambulatory Visit: Payer: Self-pay | Admitting: Family Medicine

## 2022-05-23 ENCOUNTER — Other Ambulatory Visit (HOSPITAL_COMMUNITY): Payer: Self-pay

## 2022-05-23 DIAGNOSIS — L409 Psoriasis, unspecified: Secondary | ICD-10-CM

## 2022-05-23 NOTE — Telephone Encounter (Signed)
I have not filled this previously. This is not a medication that I manage. It should be refilled by dermatology.

## 2022-05-26 ENCOUNTER — Other Ambulatory Visit (HOSPITAL_COMMUNITY): Payer: Self-pay

## 2022-05-27 ENCOUNTER — Other Ambulatory Visit: Payer: Self-pay

## 2022-05-27 ENCOUNTER — Other Ambulatory Visit: Payer: Self-pay | Admitting: Family Medicine

## 2022-05-27 ENCOUNTER — Other Ambulatory Visit (HOSPITAL_COMMUNITY): Payer: Self-pay

## 2022-05-27 MED ORDER — SERTRALINE HCL 100 MG PO TABS
ORAL_TABLET | ORAL | 3 refills | Status: DC
  Filled 2022-05-27: qty 45, 30d supply, fill #0
  Filled 2022-07-11: qty 45, 30d supply, fill #1
  Filled 2022-08-26: qty 45, 30d supply, fill #2
  Filled 2022-10-15: qty 45, 30d supply, fill #3

## 2022-05-28 ENCOUNTER — Other Ambulatory Visit (HOSPITAL_COMMUNITY): Payer: Self-pay

## 2022-05-28 NOTE — Telephone Encounter (Signed)
Patient has not been seen for psoriasis since Feb 2022 with no scheduled appts.   Denied RF and states patient needs to call for appointment.

## 2022-05-29 ENCOUNTER — Other Ambulatory Visit (HOSPITAL_COMMUNITY): Payer: Self-pay

## 2022-05-30 ENCOUNTER — Other Ambulatory Visit (HOSPITAL_COMMUNITY): Payer: Self-pay

## 2022-05-31 LAB — MISC LABCORP TEST (SEND OUT): Labcorp test code: 165620

## 2022-06-03 ENCOUNTER — Other Ambulatory Visit (HOSPITAL_COMMUNITY): Payer: Self-pay

## 2022-06-04 ENCOUNTER — Other Ambulatory Visit (HOSPITAL_COMMUNITY): Payer: Self-pay

## 2022-06-06 ENCOUNTER — Other Ambulatory Visit (HOSPITAL_COMMUNITY): Payer: Self-pay

## 2022-06-10 ENCOUNTER — Other Ambulatory Visit (HOSPITAL_COMMUNITY): Payer: Self-pay

## 2022-06-11 ENCOUNTER — Ambulatory Visit: Payer: No Typology Code available for payment source | Attending: Pulmonary Disease

## 2022-06-11 DIAGNOSIS — Z9989 Dependence on other enabling machines and devices: Secondary | ICD-10-CM | POA: Insufficient documentation

## 2022-06-11 DIAGNOSIS — G4739 Other sleep apnea: Secondary | ICD-10-CM | POA: Insufficient documentation

## 2022-06-11 DIAGNOSIS — G4733 Obstructive sleep apnea (adult) (pediatric): Secondary | ICD-10-CM | POA: Diagnosis not present

## 2022-06-11 DIAGNOSIS — Z6828 Body mass index (BMI) 28.0-28.9, adult: Secondary | ICD-10-CM | POA: Diagnosis not present

## 2022-06-12 ENCOUNTER — Ambulatory Visit: Payer: No Typology Code available for payment source | Attending: Pulmonary Disease

## 2022-06-12 DIAGNOSIS — G4739 Other sleep apnea: Secondary | ICD-10-CM | POA: Insufficient documentation

## 2022-06-12 DIAGNOSIS — R0602 Shortness of breath: Secondary | ICD-10-CM | POA: Diagnosis present

## 2022-06-12 LAB — PULMONARY FUNCTION TEST ARMC ONLY
DL/VA % pred: 78 %
DL/VA: 3.38 ml/min/mmHg/L
DLCO unc % pred: 78 %
DLCO unc: 25.62 ml/min/mmHg
FEF 25-75 Post: 7.08 L/sec
FEF 25-75 Pre: 3.9 L/sec
FEF2575-%Change-Post: 81 %
FEF2575-%Pred-Post: 190 %
FEF2575-%Pred-Pre: 105 %
FEV1-%Change-Post: 20 %
FEV1-%Pred-Post: 104 %
FEV1-%Pred-Pre: 86 %
FEV1-Post: 4.57 L
FEV1-Pre: 3.79 L
FEV1FVC-%Change-Post: 2 %
FEV1FVC-%Pred-Pre: 104 %
FEV6-%Change-Post: 17 %
FEV6-%Pred-Post: 100 %
FEV6-%Pred-Pre: 85 %
FEV6-Post: 5.5 L
FEV6-Pre: 4.69 L
FEV6FVC-%Change-Post: 0 %
FEV6FVC-%Pred-Post: 103 %
FEV6FVC-%Pred-Pre: 103 %
FVC-%Change-Post: 17 %
FVC-%Pred-Post: 97 %
FVC-%Pred-Pre: 83 %
FVC-Post: 5.54 L
FVC-Pre: 4.72 L
Post FEV1/FVC ratio: 83 %
Post FEV6/FVC ratio: 99 %
Pre FEV1/FVC ratio: 80 %
Pre FEV6/FVC Ratio: 99 %
RV % pred: 78 %
RV: 1.81 L
TLC % pred: 93 %
TLC: 7.27 L

## 2022-06-12 LAB — BLOOD GAS, ARTERIAL
Acid-Base Excess: 0.7 mmol/L (ref 0.0–2.0)
Bicarbonate: 24.5 mmol/L (ref 20.0–28.0)
O2 Saturation: 97.8 %
Patient temperature: 37
pCO2 arterial: 36 mmHg (ref 32–48)
pH, Arterial: 7.44 (ref 7.35–7.45)
pO2, Arterial: 85 mmHg (ref 83–108)

## 2022-06-12 MED ORDER — ALBUTEROL SULFATE (2.5 MG/3ML) 0.083% IN NEBU
2.5000 mg | INHALATION_SOLUTION | Freq: Once | RESPIRATORY_TRACT | Status: AC
  Administered 2022-06-12: 2.5 mg via RESPIRATORY_TRACT

## 2022-06-13 ENCOUNTER — Other Ambulatory Visit: Payer: Self-pay | Admitting: Dermatology

## 2022-06-13 ENCOUNTER — Other Ambulatory Visit (HOSPITAL_COMMUNITY): Payer: Self-pay

## 2022-06-13 DIAGNOSIS — L409 Psoriasis, unspecified: Secondary | ICD-10-CM

## 2022-06-17 ENCOUNTER — Other Ambulatory Visit (HOSPITAL_COMMUNITY): Payer: Self-pay

## 2022-06-19 ENCOUNTER — Telehealth (INDEPENDENT_AMBULATORY_CARE_PROVIDER_SITE_OTHER): Payer: No Typology Code available for payment source | Admitting: Pulmonary Disease

## 2022-06-19 DIAGNOSIS — G4739 Other sleep apnea: Secondary | ICD-10-CM

## 2022-06-19 DIAGNOSIS — G4733 Obstructive sleep apnea (adult) (pediatric): Secondary | ICD-10-CM

## 2022-06-19 NOTE — Telephone Encounter (Signed)
Per Dr. Elsworth Soho :  "Split study with severe OSA , AHI 85/hour Few centrals at baseline 7/hour Well corrected by CPAP 8 cm Centrals emerged on higher pressures , so would suggest we place him on CPAP 8 cm with medium nasal mask and review CPAP downloads on follow-up"  Patient will need CPAP at 8 cm of water pressure ordered with medium nasal mask of choice.

## 2022-06-19 NOTE — Telephone Encounter (Signed)
Split study with severe OSA , AHI 85/hour Few centrals at baseline 7/hour  Well corrected by CPAP 8 cm Centrals emerged on higher pressures , so would suggest we place him on CPAP 8 cm with medium nasal mask and review CPAP downloads on follow-up

## 2022-06-20 ENCOUNTER — Other Ambulatory Visit (HOSPITAL_COMMUNITY): Payer: Self-pay

## 2022-06-20 ENCOUNTER — Encounter: Payer: Self-pay | Admitting: Internal Medicine

## 2022-06-20 ENCOUNTER — Ambulatory Visit (INDEPENDENT_AMBULATORY_CARE_PROVIDER_SITE_OTHER): Payer: No Typology Code available for payment source | Admitting: Internal Medicine

## 2022-06-20 ENCOUNTER — Other Ambulatory Visit: Payer: Self-pay | Admitting: Pharmacist

## 2022-06-20 ENCOUNTER — Telehealth: Payer: Self-pay

## 2022-06-20 ENCOUNTER — Ambulatory Visit: Payer: No Typology Code available for payment source | Attending: Family Medicine | Admitting: Pharmacist

## 2022-06-20 DIAGNOSIS — Z79899 Other long term (current) drug therapy: Secondary | ICD-10-CM

## 2022-06-20 DIAGNOSIS — L409 Psoriasis, unspecified: Secondary | ICD-10-CM | POA: Diagnosis not present

## 2022-06-20 MED ORDER — OTEZLA 10 & 20 & 30 MG PO TBPK
ORAL_TABLET | ORAL | 0 refills | Status: DC
  Filled 2022-06-20: qty 55, fill #0
  Filled 2022-07-03 – 2022-07-10 (×2): qty 55, 28d supply, fill #0

## 2022-06-20 MED ORDER — OTEZLA 10 & 20 & 30 MG PO TBPK
ORAL_TABLET | ORAL | 0 refills | Status: DC
  Filled 2022-06-20: qty 55, fill #0

## 2022-06-20 NOTE — Telephone Encounter (Addendum)
Spoke to patient and relayed below results. He voiced his understanding.  He stated that he currently wears cpap. He will call back with name of DME so order can be placed to change settings. He is wanting to know about the implant that was discussed during last OV? He is unsure of name.   Dr. Patsey Berthold, please advise. Thanks

## 2022-06-20 NOTE — Telephone Encounter (Signed)
FYI

## 2022-06-20 NOTE — Telephone Encounter (Signed)
What we should do is see how he does with the new settings.  It may be that his all settings are causing his central apnea to be worse.  This was demonstrated during the test.  We can order him a new machine at the 8 cm H2O setting and see how he does on follow-up.  If not we can certainly reassess the device issue.  The system is by his [employer] (Remede device) and I believe the nearest place to implant and would be [facility].  But he may not need the device if he does well just with the new setting of CPAP.

## 2022-06-20 NOTE — Progress Notes (Signed)
Subjective:    Patient ID: Daniel Calderon, male    DOB: Mar 03, 1966, 56 y.o.   MRN: [id number]  HPI Here due to worsening psoriasis  Lifelong psoriasis Been on otezla from Dr Nicole Kindred in the past  Out of control --knees bleeding, etc Had been on otezla for over a year but ran out a couple of months ago Due to see Dr Nicole Kindred but couldn't get in right away  Current Outpatient Medications on File Prior to Visit  Medication Sig Dispense Refill   amLODipine (NORVASC) 10 MG tablet TAKE 1 TABLET BY MOUTH DAILY. 90 tablet 2   atorvastatin (LIPITOR) 40 MG tablet Take 1 tablet (40 mg total) by mouth daily at 6 PM. 30 tablet 2   ibuprofen (ADVIL) 600 MG tablet Take 1 tablet (600 mg total) by mouth every 6 (six) hours as needed. 30 tablet 0   Multiple Vitamin (MULTIVITAMIN) tablet Take 1 tablet by mouth daily.     omeprazole (PRILOSEC) 20 MG capsule Take one capsule by mouth daily. 90 capsule 1   sertraline (ZOLOFT) 100 MG tablet TAKE 1&1/2 TABLETS (150 MG TOTAL) BY MOUTH ONCE DAILY 45 tablet 3   No current facility-administered medications on file prior to visit.    Allergies  Allergen Reactions   Erythromycin Nausea And Vomiting   Cephalexin Other (See Comments)    Unknown   Dexamethasone Other (See Comments)    Unknown   Oxycodone-Acetaminophen Other (See Comments)    Unknown   Prednisone Other (See Comments)    Unknown   Gabapentin Other (See Comments)    Aggressive    Past Medical History:  Diagnosis Date   Allergy    Seasonal   Anxiety    Benign enlargement of prostate    Depression    Elevated BP    Elevated transaminase level    Failure of erection    Fatigue    Frequent headaches    GERD (gastroesophageal reflux disease)    History of kidney stones    Hypertension    Hypogonadism in male    Migraine    Sinus congestion 09/01/2017   Sleep apnea    Currently uses cpap   Thoracic outlet syndrome    Left Arm   Tongue pain 02/09/2019    Past Surgical  History:  Procedure Laterality Date   COLONOSCOPY WITH PROPOFOL N/A 11/11/2017   Procedure: COLONOSCOPY WITH PROPOFOL;  Surgeon: Lin Landsman, MD;  Location: [place];  Service: Endoscopy;  Laterality: N/A;   ESOPHAGOGASTRODUODENOSCOPY (EGD) WITH PROPOFOL N/A 08/25/2019   Procedure: ESOPHAGOGASTRODUODENOSCOPY (EGD) WITH PROPOFOL;  Surgeon: Lin Landsman, MD;  Location: [place];  Service: Gastroenterology;  Laterality: N/A;   KNEE ARTHROSCOPY WITH MEDIAL MENISECTOMY Left 07/26/2018   Procedure: KNEE ARTHROSCOPY WITH PARTIAL MEDIAL MENISECTOMY;  Surgeon: Leim Fabry, MD;  Location: [facility];  Service: Orthopedics;  Laterality: Left;   Left Shoulder Surgery  2011   nerve block     2 in neck. 5 in back.   NOSE SURGERY     POLYPECTOMY  11/11/2017   Procedure: POLYPECTOMY;  Surgeon: Lin Landsman, MD;  Location: [place];  Service: Endoscopy;;   SCALENE NODE BIOPSY / EXCISION     VASECTOMY      Family History  Problem Relation Age of Onset   Hypertension Mother        Living   Hearing loss Mother    Heart disease Other    Healthy Father  Living   Diabetes Brother    Stroke Paternal Uncle    Heart attack Paternal Uncle    Hearing loss Maternal Grandmother    Healthy Son    Healthy Daughter    Kidney cancer Neg Hx    Bladder Cancer Neg Hx    Prostate cancer Neg Hx     Social History   Socioeconomic History   Marital status: Married    Spouse name: Not on file   Number of children: 2   Years of education: 13   Highest education level: Not on file  Occupational History   Not on file  Tobacco Use   Smoking status: Never   Smokeless tobacco: Never  Vaping Use   Vaping Use: Never used  Substance and Sexual Activity   Alcohol use: Yes    Alcohol/week: 3.0 standard drinks of alcohol    Types: 3 Cans of beer per week   Drug use: No   Sexual activity: Yes    Partners: Female    Comment: Wife  Other Topics Concern   Not on  file  Social History Narrative   [facility]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  Skin:    Comments: Plaque psoriasis ---legs and especially on right knee  Neurological:     Mental Status: He is alert.            Assessment & Plan:

## 2022-06-20 NOTE — Telephone Encounter (Signed)
We can send a implant eval I will have to find who does them in [place].

## 2022-06-20 NOTE — Progress Notes (Signed)
  S: Patient presents for review of their specialty medication therapy.  Patient is currently taking Otezla for psoriasis. Patient is managed by Dr. Nicole Kindred for this.   Adherence: sample starter pack given today.  Efficacy: reports good efficacy when he was taking it previously.  Dosing:  Active psoriatic arthritis or plaque psoriasis (moderate to severe): Oral: Initial: 10 mg in the morning. Titrate upward by additional 10 mg per day on days 2 to 5 as follows: Day 2: 10 mg twice daily; Day 3: 10 mg in the morning and 20 mg in the evening; Day 4: 20 mg twice daily; Day 5: 20 mg in the morning and 30 mg in the evening. Maintenance dose: 30 mg twice daily starting on day 6  Current adverse effects: Headache: none GI upset: none Weight loss: none Neuropsychiatric effects: none  O:  Lab Results  Component Value Date   WBC 7.0 04/08/2022   HGB 15.2 04/08/2022   HCT 43.0 04/08/2022   MCV 89.0 04/08/2022   PLT 146.0 (L) 04/08/2022      Chemistry      Component Value Date/Time   NA 139 04/08/2022 1155   NA 141 03/10/2019 1523   K 3.7 04/08/2022 1155   CL 105 04/08/2022 1155   CO2 27 04/08/2022 1155   BUN 13 04/08/2022 1155   BUN 17 03/10/2019 1523   CREATININE 0.92 04/08/2022 1155   CREATININE 1.26 05/24/2021 1600   GLU 100 10/02/2017 0000      Component Value Date/Time   CALCIUM 9.4 04/08/2022 1155   ALKPHOS 88 04/08/2022 1155   AST 50 (H) 04/08/2022 1155   ALT 94 (H) 04/08/2022 1155   BILITOT 0.8 04/08/2022 1155   BILITOT 0.4 03/10/2019 1523       A/P: 1. Medication review: patient is currently on [place] for psoriasis and is tolerating it well. Reviewed the medication including the following: apremilast inhibits phosphodiesterase 4 (PDE4) specific for cyclic adenosine monophosphate (cAMP) which results in increased intracellular cAMP levels and regulation of numerous inflammatory mediators (eg, decreased expression of nitric oxide synthase, TNF-alpha, and interleukin  [IL]-23, as well as increased IL-10. Patient educated on purpose, proper use and potential adverse effects of Otezla. Possible adverse effects include weight loss, GI upset, headache, and mood changes. Renal function should be routinely monitored. Administer without regard to food. Do not crush, chew, or split tablets. No recommendations for any changes at this time.   Benard Halsted, PharmD, [place], [place] [phone]

## 2022-06-20 NOTE — Assessment & Plan Note (Signed)
Has done well with otezla in the past and just off for 2 months or so Discussed that I generally don't prescribe these biologics---but I will give 1 month of otezla pending his follow up with Dr Nicole Kindred

## 2022-06-20 NOTE — Telephone Encounter (Signed)
Lm for patient.  

## 2022-06-20 NOTE — Telephone Encounter (Signed)
Patient states he had a sleep study recently.  Patient states they are looking to put something in his chest and they are requesting the name of the supplier of his CPAP machine.  Patient also stated that he saw his dermatologist for psoriasis about a year ago and he needs a medication refill for [place].  Patient states his knees are bleeding through his work pants because he ran out of his medciation.  Patient states it will be two months before he can get in with his dermatologist.  We do not have appointments available today.  I scheduled patient to see Dr. Viviana Simpler at [facility] today.

## 2022-06-20 NOTE — Telephone Encounter (Signed)
Spoke to patient.  He stated that his DME company is [employer]. I relayed Dr. Domingo Dimes response/recommendations to him.  He stated that he has had his cpap pressure adjusted previously without improvement. He would like to go ahead with the implant eval.   Dr. Patsey Berthold, please advise. thanks

## 2022-06-24 ENCOUNTER — Other Ambulatory Visit (HOSPITAL_COMMUNITY): Payer: Self-pay

## 2022-06-25 ENCOUNTER — Other Ambulatory Visit (HOSPITAL_COMMUNITY): Payer: Self-pay

## 2022-06-26 DIAGNOSIS — G4733 Obstructive sleep apnea (adult) (pediatric): Secondary | ICD-10-CM

## 2022-06-26 DIAGNOSIS — Z9989 Dependence on other enabling machines and devices: Secondary | ICD-10-CM | POA: Diagnosis not present

## 2022-06-26 DIAGNOSIS — G4739 Other sleep apnea: Secondary | ICD-10-CM

## 2022-06-26 NOTE — Telephone Encounter (Signed)
Patient is aware of below message and voiced his understanding.  He call back with name of physician.  Nothing further needed at this time.

## 2022-06-26 NOTE — Telephone Encounter (Signed)
What he will need to do is to have him go to the [facility] website for the REMEDE device.  He would then take a "quiz" and they would send him physicians in the area that do the procedure.

## 2022-06-26 NOTE — Telephone Encounter (Signed)
Dr. Patsey Berthold, do you have an update on this?

## 2022-06-27 ENCOUNTER — Ambulatory Visit: Payer: No Typology Code available for payment source | Admitting: Primary Care

## 2022-06-27 ENCOUNTER — Other Ambulatory Visit: Payer: Self-pay

## 2022-06-27 ENCOUNTER — Other Ambulatory Visit: Payer: Self-pay | Admitting: Family Medicine

## 2022-06-27 ENCOUNTER — Encounter: Payer: Self-pay | Admitting: Primary Care

## 2022-06-27 VITALS — BP 124/80 | HR 78 | Temp 97.9°F | Ht 74.0 in | Wt 221.0 lb

## 2022-06-27 DIAGNOSIS — G4731 Primary central sleep apnea: Secondary | ICD-10-CM | POA: Diagnosis not present

## 2022-06-27 DIAGNOSIS — J4520 Mild intermittent asthma, uncomplicated: Secondary | ICD-10-CM | POA: Diagnosis not present

## 2022-06-27 DIAGNOSIS — G4733 Obstructive sleep apnea (adult) (pediatric): Secondary | ICD-10-CM

## 2022-06-27 DIAGNOSIS — G4730 Sleep apnea, unspecified: Secondary | ICD-10-CM

## 2022-06-27 DIAGNOSIS — Z9989 Dependence on other enabling machines and devices: Secondary | ICD-10-CM

## 2022-06-27 MED ORDER — FLUTICASONE FUROATE-VILANTEROL 100-25 MCG/ACT IN AEPB
1.0000 | INHALATION_SPRAY | Freq: Every day | RESPIRATORY_TRACT | 5 refills | Status: DC
Start: 2022-06-27 — End: 2022-07-04
  Filled 2022-06-27: qty 60, 30d supply, fill #0

## 2022-06-27 MED ORDER — OMEPRAZOLE 20 MG PO CPDR
DELAYED_RELEASE_CAPSULE | Freq: Every day | ORAL | 1 refills | Status: DC
  Filled 2022-06-27: qty 90, 90d supply, fill #0
  Filled 2022-10-15: qty 90, 90d supply, fill #1

## 2022-06-27 NOTE — Progress Notes (Signed)
$'@Patient'Q$  ID: Daniel Calderon, male    DOB: April 17, 1966, 56 y.o.   MRN: [id number]  Chief Complaint  Patient presents with   Follow-up    Recent sleep study-    Referring provider: [name], MD  HPI: 56 year old male. Past medical history significant for hypertension, OSA on CPAP, cataplexy, neuropathy, adjustment disorder with anxiety/depression, hyperlipidemia, environmental allergies.  06/27/2022 Patient was seen by Dr. Patsey Berthold recently in May for complex sleep apnea.  Currently on CPAP, concern for worsening central sleep apneas.  Neuromuscular complaints, felt to possibly benefit from phrenic nerve stimulator. He completed survey for phrenic nerve stimulator and was given name for [facility] in [place]. He has adjusted CPAP pressure to 8cm H20. He has persistent shortness of breath. Tells me that he is told very often that he is breathing heavy. He was looking into inspire device but he has more central apneas than obstructive. Needs new CPAP machine, last replaced in 2015. PFTs showed no obstructive lung disease, pos BD response and hyperinflation.   [facility] download 03/30/22-04/28/21 Usage 30/30 days (100%); 30 days (100%) > 4 hours  Average usage 9 hours 6 mins Pressure 7-9cm h20 Events per hour - AI 3.6/ HI 1.0  Apnea index - central 1.9/obstructive 1.7  AHI 4.6  [facility] in [place]. Phone [phone].   Allergies  Allergen Reactions   Erythromycin Nausea And Vomiting   Cephalexin Other (See Comments)    Unknown   Dexamethasone Other (See Comments)    Unknown   Oxycodone-Acetaminophen Other (See Comments)    Unknown   Prednisone Other (See Comments)    Unknown   Gabapentin Other (See Comments)    Aggressive    Immunization History  Administered Date(s) Administered   Influenza Split 09/21/2014   Influenza-Unspecified 08/22/2016, 09/13/2018   PFIZER Comirnaty(Gray Top)Covid-19 Tri-Sucrose  Vaccine 06/07/2021   Tdap 12/23/2013    Past Medical History:  Diagnosis Date   Allergy    Seasonal   Anxiety    Benign enlargement of prostate    Depression    Elevated BP    Elevated transaminase level    Failure of erection    Fatigue    Frequent headaches    GERD (gastroesophageal reflux disease)    History of kidney stones    Hypertension    Hypogonadism in male    Migraine    Sinus congestion 09/01/2017   Sleep apnea    Currently uses cpap   Thoracic outlet syndrome    Left Arm   Tongue pain 02/09/2019    Tobacco History: Social History   Tobacco Use  Smoking Status Never  Smokeless Tobacco Never   Counseling given: Not Answered   Outpatient Medications Prior to Visit  Medication Sig Dispense Refill   amLODipine (NORVASC) 10 MG tablet TAKE 1 TABLET BY MOUTH DAILY. 90 tablet 2   Apremilast (OTEZLA) 10 & 20 & 30 MG TBPK Take 1 tab daily and increase per the pack instructions 55 each 0   atorvastatin (LIPITOR) 40 MG tablet Take 1 tablet (40 mg total) by mouth daily at 6 PM. 30 tablet 2   ibuprofen (ADVIL) 600 MG tablet Take 1 tablet (600 mg total) by mouth every 6 (six) hours as needed. 30 tablet 0   Multiple Vitamin (MULTIVITAMIN) tablet Take 1 tablet by mouth daily.     omeprazole (PRILOSEC) 20 MG capsule Take one capsule by mouth daily. 90 capsule 1  sertraline (ZOLOFT) 100 MG tablet TAKE 1&1/2 TABLETS (150 MG TOTAL) BY MOUTH ONCE DAILY 45 tablet 3   No facility-administered medications prior to visit.   Review of Systems  Review of Systems  Constitutional: Negative.   HENT: Negative.    Respiratory:  Positive for shortness of breath. Negative for cough and wheezing.      Physical Exam  BP 124/80 (BP Location: Left Arm, Cuff Size: Large)   Pulse 78   Temp 97.9 F (36.6 C) (Temporal)   Ht '6\' 2"'$  (1.88 m)   Wt 221 lb (100.2 kg)   SpO2 98%   BMI 28.37 kg/m  Physical Exam Constitutional:      Appearance: Normal appearance.  HENT:     Head:  Normocephalic and atraumatic.     Mouth/Throat:     Mouth: Mucous membranes are moist.     Pharynx: Oropharynx is clear.  Cardiovascular:     Rate and Rhythm: Normal rate and regular rhythm.  Pulmonary:     Effort: Pulmonary effort is normal.     Breath sounds: Normal breath sounds.  Musculoskeletal:        General: Normal range of motion.  Skin:    General: Skin is warm and dry.  Neurological:     General: No focal deficit present.     Mental Status: He is alert and oriented to person, place, and time. Mental status is at baseline.  Psychiatric:        Mood and Affect: Mood normal.        Behavior: Behavior normal.        Thought Content: Thought content normal.        Judgment: Judgment normal.      Lab Results:  CBC    Component Value Date/Time   WBC 7.0 04/08/2022 1155   RBC 4.83 04/08/2022 1155   HGB 15.2 04/08/2022 1155   HGB 15.4 03/18/2021 1340   HCT 43.0 04/08/2022 1155   HCT 42.3 03/18/2021 1340   PLT 146.0 (L) 04/08/2022 1155   MCV 89.0 04/08/2022 1155   MCH 30.0 04/25/2020 0818   MCHC 35.4 04/08/2022 1155   RDW 13.4 04/08/2022 1155   LYMPHSABS 1.3 04/08/2022 1155   MONOABS 0.4 04/08/2022 1155   EOSABS 0.4 04/08/2022 1155   BASOSABS 0.1 04/08/2022 1155    BMET    Component Value Date/Time   NA 139 04/08/2022 1155   NA 141 03/10/2019 1523   K 3.7 04/08/2022 1155   CL 105 04/08/2022 1155   CO2 27 04/08/2022 1155   GLUCOSE 122 (H) 04/08/2022 1155   BUN 13 04/08/2022 1155   BUN 17 03/10/2019 1523   CREATININE 0.92 04/08/2022 1155   CREATININE 1.26 05/24/2021 1600   CALCIUM 9.4 04/08/2022 1155   GFRNONAA >60 01/28/2021 1330   GFRAA >60 04/25/2020 0818    BNP    Component Value Date/Time   BNP 22.0 02/19/2017 1012    ProBNP No results found for: "PROBNP"  Imaging: SLEEP STUDY DOCUMENTS  Result Date: 07/03/2022 Ordered by an unspecified provider.  SLEEP STUDY DOCUMENTS  Result Date: 07/03/2022 Ordered by an unspecified provider.     Assessment & Plan:   Asthma - Patient has persistent dyspnea symptoms. Pulmonary function testing showed no evidence of obstructive lung disease, he did have some evidence hyperinflation with positive bronchodilator response suggestive of asthma.  Recommend starting low dose ICS/LABA inhaler called Breo 163mg one puff daily.   Sleep apnea - Currently on CPAP for complex  sleep apnea. Concern for worsening central sleep apnea with neuromuscular complaints. Dr. Patsey Berthold recommending phrenic nerve stimulator. Patient referred to [facility] center for management. Referring to Dr. Halford Chessman.   OSA on CPAP - Patient is 100% compliant with use last 30 days. Average usage 9 hours 6 mins. Pressure 7-8cm h20; Residual AHI 4.6/hr. Adjusted CPAP pressure to 8cm h20. Needs new CPAP machine, last replaced in 2015   Martyn Ehrich, [place] 07/21/2022

## 2022-06-27 NOTE — Patient Instructions (Addendum)
Pulmonary function testing showed no evidence of obstructive lung disease, he did have some evidence hyperinflation with positive bronchodilator response suggestive of asthma.  Recommend starting you on a daily steroid inhaler.  Orders New CPAP at 8cm H2O  Referral: [facility] in [place] for phrenic nerve stimulator eval Phone [phone]   Rx: Breo 158mg one puff daily  Follow-up September with Dr. SHalford Chessman-new patient for complex sleep apnea (30 minutes)

## 2022-06-30 ENCOUNTER — Other Ambulatory Visit: Payer: Self-pay

## 2022-06-30 ENCOUNTER — Telehealth: Payer: Self-pay | Admitting: Primary Care

## 2022-06-30 NOTE — Telephone Encounter (Addendum)
Sleep study has been sent to scan center but has not been scanned into chart yet.   Spoke to [facility] and requested copy of sleep study. Will await fax.

## 2022-06-30 NOTE — Telephone Encounter (Signed)
[facility] has not faxed over results.  Lm for [facility] to request results once more.  Patient is aware that we are currently awaiting report.

## 2022-06-30 NOTE — Telephone Encounter (Signed)
Spoke to patient. He stated that [facility] is not affordable with a co pay of 74 dollars.   Pharmacy team, can you assist with this? Is there a cheaper alternative?

## 2022-07-01 NOTE — Telephone Encounter (Signed)
Sleep study has been received and placed up front for pickup. Left message for patient.

## 2022-07-01 NOTE — Telephone Encounter (Signed)
Sleep study has been picked up by patient. Will close encounter as nothing further is needed.

## 2022-07-02 ENCOUNTER — Other Ambulatory Visit: Payer: Self-pay

## 2022-07-02 ENCOUNTER — Telehealth: Payer: Self-pay | Admitting: Primary Care

## 2022-07-02 NOTE — Telephone Encounter (Signed)
Spoke to [place] with [facility] and requested sleep study.

## 2022-07-02 NOTE — Telephone Encounter (Signed)
Sleep study has been received and faxed to [facility]/

## 2022-07-03 ENCOUNTER — Other Ambulatory Visit (HOSPITAL_COMMUNITY): Payer: Self-pay

## 2022-07-03 ENCOUNTER — Other Ambulatory Visit: Payer: Self-pay

## 2022-07-03 ENCOUNTER — Telehealth: Payer: Self-pay | Admitting: Primary Care

## 2022-07-03 NOTE — Telephone Encounter (Signed)
Okay to substitute with Advair 100/50, 1 inhalation twice a day.

## 2022-07-03 NOTE — Telephone Encounter (Signed)
Pt is very upset with our office. He states this prescription needs to be handled ASAP as it has been since Monday. States "he shouldn't have to use his status or badge to get things going." Would like to speak with Lesleigh Noe if possible.

## 2022-07-03 NOTE — Telephone Encounter (Signed)
Spoke to patient.  He stated that he spoke to [facility] was advised that Advair is a cheaper alternative.    Dr. Patsey Berthold, please advise if okay to switch. Daniel Calderon is unavailable until 7/17/203

## 2022-07-03 NOTE — Telephone Encounter (Signed)
Patient is aware that we are still awaiting a response form pharmacy team.  Pharmacy team, please advise. Thanks

## 2022-07-04 ENCOUNTER — Other Ambulatory Visit: Payer: Self-pay

## 2022-07-04 ENCOUNTER — Other Ambulatory Visit (HOSPITAL_COMMUNITY): Payer: Self-pay

## 2022-07-04 MED ORDER — FLUTICASONE-SALMETEROL 100-50 MCG/ACT IN AEPB
1.0000 | INHALATION_SPRAY | Freq: Two times a day (BID) | RESPIRATORY_TRACT | 11 refills | Status: DC
  Filled 2022-07-04: qty 60, 30d supply, fill #0

## 2022-07-04 NOTE — Telephone Encounter (Signed)
Patient is aware of below message and voiced his understanding.  Advair sent to preferred pharmacy. Nothing further needed.

## 2022-07-07 NOTE — Telephone Encounter (Signed)
Seems like encounter was open in error so closing encounter.  

## 2022-07-08 ENCOUNTER — Other Ambulatory Visit (HOSPITAL_COMMUNITY): Payer: Self-pay

## 2022-07-10 ENCOUNTER — Telehealth: Payer: Self-pay | Admitting: Primary Care

## 2022-07-10 ENCOUNTER — Other Ambulatory Visit (HOSPITAL_COMMUNITY): Payer: Self-pay

## 2022-07-10 NOTE — Telephone Encounter (Signed)
Spoke to patient.  He stated that [facility] faxed over medical record request. He is aware that our office has not received request to forward to medical record department. He will call [facility] and request that they re fax.

## 2022-07-10 NOTE — Telephone Encounter (Signed)
Spoke with Deana from [facility] who states she is needing pt's medical records prior to being able to see if a procedure can be approved on pt. Stated to her that she could contact our medical records dept and she verbalized understanding. Phone number for medical records provided to [facility]. Nothing further needed.

## 2022-07-11 ENCOUNTER — Other Ambulatory Visit (HOSPITAL_COMMUNITY): Payer: Self-pay

## 2022-07-11 ENCOUNTER — Other Ambulatory Visit: Payer: Self-pay

## 2022-07-21 DIAGNOSIS — J45909 Unspecified asthma, uncomplicated: Secondary | ICD-10-CM | POA: Insufficient documentation

## 2022-07-21 NOTE — Assessment & Plan Note (Deleted)
-   Adjusted CPAP pressure to 8cm h20 - Needs new CPAP machine, last replaced in 2015

## 2022-07-21 NOTE — Assessment & Plan Note (Signed)
-   Patient has persistent dyspnea symptoms. Pulmonary function testing showed no evidence of obstructive lung disease, he did have some evidence hyperinflation with positive bronchodilator response suggestive of asthma.  Recommend starting low dose ICS/LABA inhaler called Breo 185mg one puff daily.

## 2022-07-21 NOTE — Assessment & Plan Note (Addendum)
-   Patient is 100% compliant with use last 30 days. Average usage 9 hours 6 mins. Pressure 7-8cm h20; Residual AHI 4.6/hr. Adjusted CPAP pressure to 8cm h20. Needs new CPAP machine, last replaced in 2015

## 2022-07-21 NOTE — Assessment & Plan Note (Addendum)
-   Currently on CPAP for complex sleep apnea. Concern for worsening central sleep apnea with neuromuscular complaints. Dr. Patsey Berthold recommending phrenic nerve stimulator. Patient referred to [facility] center for management. Referring to Dr. Halford Chessman.

## 2022-07-24 ENCOUNTER — Telehealth: Payer: Self-pay | Admitting: Pulmonary Disease

## 2022-07-24 NOTE — Telephone Encounter (Signed)
Called Dr Druckers office back and Jenny Reichmann stated that since patient is on a [employer] plan that [facility] is out of network. Jenny Reichmann stated that a   "Out of Network Exception" order before [facility] will pay for device.   Please advise

## 2022-07-30 ENCOUNTER — Other Ambulatory Visit (HOSPITAL_COMMUNITY): Payer: Self-pay

## 2022-07-30 ENCOUNTER — Other Ambulatory Visit: Payer: Self-pay | Admitting: Internal Medicine

## 2022-07-31 ENCOUNTER — Emergency Department: Payer: No Typology Code available for payment source

## 2022-07-31 ENCOUNTER — Encounter: Payer: Self-pay | Admitting: Emergency Medicine

## 2022-07-31 ENCOUNTER — Other Ambulatory Visit: Payer: Self-pay

## 2022-07-31 ENCOUNTER — Emergency Department
Admission: EM | Admit: 2022-07-31 | Discharge: 2022-07-31 | Disposition: A | Discharge status: Home / Self Care | Payer: No Typology Code available for payment source | Attending: Emergency Medicine | Admitting: Emergency Medicine

## 2022-07-31 DIAGNOSIS — J029 Acute pharyngitis, unspecified: Secondary | ICD-10-CM | POA: Diagnosis not present

## 2022-07-31 DIAGNOSIS — R079 Chest pain, unspecified: Secondary | ICD-10-CM | POA: Diagnosis not present

## 2022-07-31 DIAGNOSIS — R200 Anesthesia of skin: Secondary | ICD-10-CM | POA: Diagnosis not present

## 2022-07-31 DIAGNOSIS — Z8379 Family history of other diseases of the digestive system: Secondary | ICD-10-CM

## 2022-07-31 LAB — CBC
HCT: 44.5 % (ref 39.0–52.0)
Hemoglobin: 15.9 g/dL (ref 13.0–17.0)
MCH: 31.4 pg (ref 26.0–34.0)
MCHC: 35.7 g/dL (ref 30.0–36.0)
MCV: 87.8 fL (ref 80.0–100.0)
Platelets: 150 10*3/uL (ref 150–400)
RBC: 5.07 MIL/uL (ref 4.22–5.81)
RDW: 12.8 % (ref 11.5–15.5)
WBC: 6 10*3/uL (ref 4.0–10.5)
nRBC: 0 % (ref 0.0–0.2)

## 2022-07-31 LAB — BASIC METABOLIC PANEL
Anion gap: 9 (ref 5–15)
BUN: 15 mg/dL (ref 6–20)
CO2: 24 mmol/L (ref 22–32)
Calcium: 9.7 mg/dL (ref 8.9–10.3)
Chloride: 108 mmol/L (ref 98–111)
Creatinine, Ser: 0.92 mg/dL (ref 0.61–1.24)
GFR, Estimated: >60 mL/min (ref >60)
Glucose, Bld: 107 mg/dL — ABNORMAL HIGH (ref 70–99)
Potassium: 3.9 mmol/L (ref 3.5–5.1)
Sodium: 141 mmol/L (ref 135–145)

## 2022-07-31 LAB — TROPONIN I (HIGH SENSITIVITY)
Troponin I (High Sensitivity): 6 ng/L (ref <18)
Troponin I (High Sensitivity): 6 ng/L (ref <18)

## 2022-07-31 MED ORDER — ASPIRIN 81 MG PO CHEW
324.0000 mg | CHEWABLE_TABLET | Freq: Once | ORAL | Status: AC
  Administered 2022-07-31: 324 mg via ORAL
  Filled 2022-07-31: qty 4

## 2022-07-31 MED ORDER — ALUM & MAG HYDROXIDE-SIMETH 200-200-20 MG/5ML PO SUSP
30.0000 mL | Freq: Once | ORAL | Status: AC
  Administered 2022-07-31: 30 mL via ORAL
  Filled 2022-07-31: qty 30

## 2022-07-31 NOTE — ED Triage Notes (Signed)
Presents with sudden onset of chest pain this am  Diaphoretic on arrival  some SOB

## 2022-07-31 NOTE — ED Provider Notes (Signed)
[facility] Provider Note   Event Date/Time   First MD Initiated Contact with Patient 07/31/22 0715     (approximate) History  Chest Pain  HPI Daniel Calderon is a 56 y.o. male with past medical history of GERD presents for midepigastric and substernal burning chest pain that radiates through to his back.  Patient describes his pain as a 9/10, burning pain that began since he awoke and has been worsening since onset.  Patient denies any exertional worsening of this pain.  Patient does endorse mild sore throat and complaining of left-sided facial numbness. ROS: Patient currently denies any vision changes, tinnitus, difficulty speaking, facial droop, sore throat, chest pain, shortness of breath, abdominal pain, nausea/vomiting/diarrhea, dysuria, or weakness/numbness/paresthesias in any extremity   Physical Exam  Triage Vital Signs: ED Triage Vitals  Enc Vitals Group     BP 07/31/22 0712 (!) 151/90     Pulse Rate 07/31/22 0712 69     Resp 07/31/22 0712 18     Temp 07/31/22 0712 97.8 F (36.6 C)     Temp Source 07/31/22 0712 Oral     SpO2 07/31/22 0712 98 %     Weight 07/31/22 0708 220 lb 14.4 oz (100.2 kg)     Height 07/31/22 0708 '6\' 2"'$  (1.88 m)     Head Circumference --      Peak Flow --      Pain Score 07/31/22 0708 9     Pain Loc --      Pain Edu? --      Excl. in [place]? --    Most recent vital signs: Vitals:   07/31/22 0830 07/31/22 0901  BP: 132/84 (!) 135/101  Pulse: (!) 58 60  Resp: 18 20  Temp:    SpO2: 96% 99%   General: Awake, oriented x4. CV:  Good peripheral perfusion.  Resp:  Normal effort.  Abd:  No distention.  Other:  Middle-aged Caucasian male laying in bed in mild distress secondary to pain.  Left tympanic membrane erythematous without significant purulent material behind the membrane.  Erythematous posterior oropharynx ED Results / Procedures / Treatments  Labs (all labs ordered are listed, but only abnormal results are  displayed) Labs Reviewed  BASIC METABOLIC PANEL - Abnormal; Notable for the following components:      Result Value   Glucose, Bld 107 (*)    All other components within normal limits  CBC  TROPONIN I (HIGH SENSITIVITY)  TROPONIN I (HIGH SENSITIVITY)   EKG ED ECG REPORT I, Naaman Plummer, the attending physician, personally viewed and interpreted this ECG. Date: 07/31/2022 EKG Time: 0708 Rate: 57 Rhythm: Bradycardic sinus rhythm QRS Axis: normal Intervals: normal ST/T Wave abnormalities: normal Narrative Interpretation: Bradycardic sinus rhythm.  No evidence of acute ischemia RADIOLOGY ED MD interpretation: 2 view chest x-ray interpreted by me shows no evidence of acute abnormalities including no pneumonia, pneumothorax, or widened mediastinum -Agree with radiology assessment Official radiology report(s): DG Chest 2 View  Result Date: 07/31/2022 CLINICAL DATA:  56 year old male history of sudden onset of chest pain, nausea and vomiting. Diaphoresis. EXAM: CHEST - 2 VIEW COMPARISON:  Chest x-ray 04/25/2020. FINDINGS: Lung volumes are low. No consolidative airspace disease. No pleural effusions. No pneumothorax. No pulmonary nodule or mass noted. Pulmonary vasculature and the cardiomediastinal silhouette are within normal limits. IMPRESSION: 1. Low lung volumes without radiographic evidence of acute cardiopulmonary disease. Electronically Signed   By: Vinnie Langton M.D.   On: 07/31/2022 07:38  PROCEDURES: Critical Care performed: No .1-3 Lead EKG Interpretation  Performed by: Naaman Plummer, MD Authorized by: Naaman Plummer, MD     Interpretation: normal     ECG rate:  61   ECG rate assessment: normal     Rhythm: sinus rhythm     Ectopy: none     Conduction: normal    MEDICATIONS ORDERED IN ED: Medications  aspirin chewable tablet 324 mg (324 mg Oral Given 07/31/22 0819)  alum & mag hydroxide-simeth (MAALOX/MYLANTA) 200-200-20 MG/5ML suspension 30 mL (30 mLs Oral Given  07/31/22 0819)   IMPRESSION / MDM / ASSESSMENT AND PLAN / ED COURSE  I reviewed the triage vital signs and the nursing notes.                             The patient is on the cardiac monitor to evaluate for evidence of arrhythmia and/or significant heart rate changes. Patient's presentation is most consistent with acute presentation with potential threat to life or bodily function. Workup: ECG, CXR, CBC, BMP, Troponin Findings: ECG: No overt evidence of STEMI. No evidence of Brugadas sign, delta wave, epsilon wave, significantly prolonged QTc, or malignant arrhythmia HS Troponin: Negative x1 Other Labs unremarkable for emergent problems. CXR: Without PTX, PNA, or widened mediastinum Last Stress Test:  2021 Last Heart Catheterization:  2020 HEART Score: 3  Given History, Exam, and Workup I have low suspicion for ACS, Pneumothorax, Pneumonia, Pulmonary Embolus, Tamponade, Aortic Dissection or other emergent problem as a cause for this presentation.   Reassesment: Prior to discharge patients pain was controlled and they were well appearing.  Disposition:  Discharge. Strict return precautions discussed with patient with full understanding. Advised patient to follow up promptly with primary care provider    FINAL CLINICAL IMPRESSION(S) / ED DIAGNOSES   Final diagnoses:  Chest pain, unspecified type  Family history of GERD   Rx / DC Orders   ED Discharge Orders     None      Note:  This document was prepared using Dragon voice recognition software and may include unintentional dictation errors.   Naaman Plummer, MD 07/31/22 [phone]

## 2022-08-01 ENCOUNTER — Other Ambulatory Visit (HOSPITAL_COMMUNITY): Payer: Self-pay

## 2022-08-01 NOTE — Telephone Encounter (Signed)
This is not a medication that I fill. He needs to get this refill from his dermatologist.

## 2022-08-05 ENCOUNTER — Other Ambulatory Visit (HOSPITAL_COMMUNITY): Payer: Self-pay

## 2022-08-06 NOTE — Telephone Encounter (Signed)
Please advise if there is any update on this.

## 2022-08-07 ENCOUNTER — Other Ambulatory Visit (HOSPITAL_COMMUNITY): Payer: Self-pay

## 2022-08-14 ENCOUNTER — Other Ambulatory Visit: Payer: Self-pay

## 2022-08-26 ENCOUNTER — Other Ambulatory Visit: Payer: Self-pay

## 2022-08-27 ENCOUNTER — Ambulatory Visit: Payer: No Typology Code available for payment source | Admitting: Dermatology

## 2022-08-27 DIAGNOSIS — L409 Psoriasis, unspecified: Secondary | ICD-10-CM

## 2022-08-27 DIAGNOSIS — L4050 Arthropathic psoriasis, unspecified: Secondary | ICD-10-CM

## 2022-08-27 DIAGNOSIS — L7211 Pilar cyst: Secondary | ICD-10-CM

## 2022-08-27 DIAGNOSIS — Z79899 Other long term (current) drug therapy: Secondary | ICD-10-CM | POA: Diagnosis not present

## 2022-08-27 NOTE — Progress Notes (Signed)
Follow-Up Visit   Subjective  Daniel Calderon is a 56 y.o. male who presents for the following: Psoriasis (Would like to stop [place]. Would like to discuss biologic treatments. Knees, elbows. Rutherford Nail causes heartburn and makes him have to "go to the bathroom a lot". He states topical Rx steroid creams cause more itching. Has used Calcipotriene and Betamethasone ) and Cyst (Right frontal scalp. Raised. Larger at times). Patient does have joint pain in knees.    The following portions of the chart were reviewed this encounter and updated as appropriate:      Review of Systems: No other skin or systemic complaints except as noted in HPI or Assessment and Plan.   Objective  Well appearing patient in no apparent distress; mood and affect are within normal limits.  A focused examination was performed including scalp, face, elbows, knees. Relevant physical exam findings are noted in the Assessment and Plan.  knees, elbows Pink scaly papules at B/L pretibial, pink scaly plaques at right knee, right elbow  BSA 6% on Otezla  Right Parietal Scalp 1 cm firm subcutaneous nodule   Assessment & Plan  Psoriasis knees, elbows  With PsA, Chronic and persistent condition with duration or expected duration over one year. Condition is symptomatic/ bothersome to patient. Not currently at goal.   Pt has persistent GI side effects on Otezla, and psoriasis is not clearing up.  Psoriasis is a chronic non-curable, but treatable genetic/hereditary disease that may have other systemic features affecting other organ systems such as joints (Psoriatic Arthritis). It is associated with an increased risk of inflammatory bowel disease, heart disease, non-alcoholic fatty liver disease, and depression.    Discussed Sotyktu vs injectable biologics. Pt prefers to start biologic injections.   Plan to start [facility] pending normal labs and negative TB test. Will send to [facility].   Has used and failed  Calcipotriene and Betamethasone and Otezla (persistent GI upset).  Okay to continue [place] until can start [employer].  Side effects of Otezla (apremilast) include diarrhea, nausea, headache, upper respiratory infection, depression, and weight decrease (5-10%). It should only be taken by pregnant women after a discussion regarding risks and benefits with their doctor. Goal is control of skin condition, not cure.  The use of Rutherford Nail requires long term medication management, including periodic office visits.   Reviewed risks of biologics including immunosuppression, infections, injection site reaction, and failure to improve condition. Goal is control of skin condition, not cure.  Some older biologics such as Humira and Enbrel may slightly increase risk of malignancy and may worsen congestive heart failure.  Talz and Cosentyx may cause inflammatory bowel disease to flare. The use of biologics requires long term medication management, including periodic office visits and monitoring of blood work.    Related Procedures Comprehensive metabolic panel CBC with Differential/Platelet Hepatitis B surface antibody,qualitative Hepatitis B surface antigen Hepatitis C antibody QuantiFERON-TB Gold Plus  Encounter for long-term (current) use of high-risk medication  Related Procedures Comprehensive metabolic panel CBC with Differential/Platelet Hepatitis B surface antibody,qualitative Hepatitis B surface antigen Hepatitis C antibody QuantiFERON-TB Gold Plus  Pilar cyst Right Parietal Scalp  Cyst with symptoms and/or recent change.  Discussed surgical excision to remove, including resulting scar and possible recurrence.  Patient will schedule for surgery. Pre-op information given.    Return for Cyst Excision, Next Available; 1 month psoriasis follow up.  I, Emelia Salisbury, CMA, am acting as scribe for Brendolyn Patty, MD.  Documentation: I have reviewed the above documentation for accuracy  and  completeness, and I agree with the above.  Brendolyn Patty MD

## 2022-08-27 NOTE — Patient Instructions (Signed)
Reviewed risks of biologics including immunosuppression, infections, injection site reaction, and failure to improve condition. Goal is control of skin condition, not cure.  Some older biologics such as Humira and Enbrel may slightly increase risk of malignancy and may worsen congestive heart failure.  Talz and Cosentyx may cause inflammatory bowel disease to flare. The use of biologics requires long term medication management, including periodic office visits and monitoring of blood work.      Pre-Operative Instructions  You are scheduled for a surgical procedure at [facility]. We recommend you read the following instructions. If you have any questions or concerns, please call the office at [phone].  Shower and wash the entire body with soap and water the day of your surgery paying special attention to cleansing at and around the planned surgery site.  Avoid aspirin or aspirin containing products at least fourteen (14) days prior to your surgical procedure and for at least one week (7 Days) after your surgical procedure. If you take aspirin on a regular basis for heart disease or history of stroke or for any other reason, we may recommend you continue taking aspirin but please notify us if you take this on a regular basis. Aspirin can cause more bleeding to occur during surgery as well as prolonged bleeding and bruising after surgery.   Avoid other nonsteroidal pain medications at least one week prior to surgery and at least one week prior to your surgery. These include medications such as Ibuprofen (Motrin, Advil and Nuprin), Naprosyn, Voltaren, Relafen, etc. If medications are used for therapeutic reasons, please inform us as they can cause increased bleeding or prolonged bleeding during and bruising after surgical procedures.   Please advise [name] if you are taking any "blood thinner" medications such as Coumadin or Dipyridamole or Plavix or similar medications. These cause increased  bleeding and prolonged bleeding during procedures and bruising after surgical procedures. We may have to consider discontinuing these medications briefly prior to and shortly after your surgery if safe to do so.   Please inform us of all medications you are currently taking. All medications that are taken regularly should be taken the day of surgery as you always do. Nevertheless, we need to be informed of what medications you are taking prior to surgery to know whether they will affect the procedure or cause any complications.   Please inform us of any medication allergies. Also inform us of whether you have allergies to Latex or rubber products or whether you have had any adverse reaction to Lidocaine or Epinephrine.  Please inform us of any prosthetic or artificial body parts such as artificial heart valve, joint replacements, etc., or similar condition that might require preoperative antibiotics.   We recommend avoidance of alcohol at least two weeks prior to surgery and continued avoidance for at least two weeks after surgery.   We recommend discontinuation of tobacco smoking at least two weeks prior to surgery and continued abstinence for at least two weeks after surgery.  Do not plan strenuous exercise, strenuous work or strenuous lifting for approximately four weeks after your surgery.   We request if you are unable to make your scheduled surgical appointment, please call us at least a week in advance or as soon as you are aware of a problem so that we can cancel or reschedule the appointment.   You MAY TAKE TYLENOL (acetaminophen) for pain as it is not a blood thinner.   PLEASE PLAN TO BE IN [place] FOR TWO WEEKS  FOLLOWING SURGERY, THIS IS IMPORTANT SO YOU CAN BE CHECKED FOR DRESSING CHANGES, SUTURE REMOVAL AND TO MONITOR FOR POSSIBLE COMPLICATIONS.   Due to recent changes in healthcare laws, you may see results of your pathology and/or laboratory studies on MyChart before the doctors have  had a chance to review them. We understand that in some cases there may be results that are confusing or concerning to you. Please understand that not all results are received at the same time and often the doctors may need to interpret multiple results in order to provide you with the best plan of care or course of treatment. Therefore, we ask that you please give [name] 2 business days to thoroughly review all your results before contacting the office for clarification. Should we see a critical lab result, you will be contacted sooner.   If You Need Anything After Your Visit  If you have any questions or concerns for your doctor, please call our main line at [phone] and press option 4 to reach your doctor's medical assistant. If no one answers, please leave a voicemail as directed and we will return your call as soon as possible. Messages left after 4 pm will be answered the following business day.   You may also send [name] a message via [place]. We typically respond to MyChart messages within 1-2 business days.  For prescription refills, please ask your pharmacy to contact our office. Our fax number is [phone].  If you have an urgent issue when the clinic is closed that cannot wait until the next business day, you can page your doctor at the number below.    Please note that while we do our best to be available for urgent issues outside of office hours, we are not available 24/7.   If you have an urgent issue and are unable to reach [name], you may choose to seek medical care at your doctor's office, retail clinic, urgent care center, or emergency room.  If you have a medical emergency, please immediately call 911 or go to the emergency department.  Pager Numbers  - Dr. Nehemiah Massed: [phone]  - Dr. Laurence Ferrari: [phone]  - Dr. Nicole Kindred: [phone]  In the event of inclement weather, please call our main line at [phone] for an update on the status of any delays or  closures.  Dermatology Medication Tips: Please keep the boxes that topical medications come in in order to help keep track of the instructions about where and how to use these. Pharmacies typically print the medication instructions only on the boxes and not directly on the medication tubes.   If your medication is too expensive, please contact our office at [phone] option 4 or send [name] a message through [place].   We are unable to tell what your co-pay for medications will be in advance as this is different depending on your insurance coverage. However, we may be able to find a substitute medication at lower cost or fill out paperwork to get insurance to cover a needed medication.   If a prior authorization is required to get your medication covered by your insurance company, please allow [name] 1-2 business days to complete this process.  Drug prices often vary depending on where the prescription is filled and some pharmacies may offer cheaper prices.  The website www.goodrx.com contains coupons for medications through different pharmacies. The prices here do not account for what the cost may be with help from insurance (it may be cheaper with your insurance), but the website  can give you the price if you did not use any insurance.  - You can print the associated coupon and take it with your prescription to the pharmacy.  - You may also stop by our office during regular business hours and pick up a GoodRx coupon card.  - If you need your prescription sent electronically to a different pharmacy, notify our office through [facility] or by phone at [phone] option 4.     Si Usted Necesita Algo Despus de Su Visita  Tambin puede enviarnos un mensaje a travs de Pharmacist, community. Por lo general respondemos a los mensajes de MyChart en el transcurso de 1 a 2 das hbiles.  Para renovar recetas, por favor pida a su farmacia que se ponga en contacto con nuestra oficina. Harland Dingwall de fax  es [place] [phone].  Si tiene un asunto urgente cuando la clnica est cerrada y que no puede esperar hasta el siguiente da hbil, puede llamar/localizar a su doctor(a) al nmero que aparece a continuacin.   Por favor, tenga en cuenta que aunque hacemos todo lo posible para estar disponibles para asuntos urgentes fuera del horario de [place], no estamos disponibles las 24 horas del da, los 7 das de la [place].   Si tiene un problema urgente y no puede comunicarse con nosotros, puede optar por buscar atencin mdica  en el consultorio de su doctor(a), en una clnica privada, en un centro de atencin urgente o en una sala de emergencias.  Si tiene Engineering geologist, por favor llame inmediatamente al 911 o vaya a la sala de emergencias.  Nmeros de bper  - Dr. Nehemiah Massed: [phone]  - Dra. Moye: [phone]  - Dra. Nicole Kindred: [phone]  En caso de inclemencias del [place], por favor llame a Johnsie Kindred principal al [phone] para una actualizacin sobre el [place] de cualquier retraso o cierre.  Consejos para la medicacin en dermatologa: Por favor, guarde las cajas en las que vienen los medicamentos de uso tpico para ayudarle a seguir las instrucciones sobre dnde y cmo usarlos. Las farmacias generalmente imprimen las instrucciones del medicamento slo en las cajas y no directamente en los tubos del [place].   Si su medicamento es muy caro, por favor, pngase en contacto con Zigmund Daniel llamando al [phone] y presione la opcin 4 o envenos un mensaje a travs de Pharmacist, community.   No podemos decirle cul ser su copago por los medicamentos por adelantado ya que esto es diferente dependiendo de la cobertura de su seguro. Sin embargo, es posible que podamos encontrar un medicamento sustituto a Electrical engineer un formulario para que el seguro cubra el medicamento que se considera necesario.   Si se requiere una autorizacin previa para que su compaa de seguros [place]  su medicamento, por favor permtanos de 1 a 2 das hbiles para completar este proceso.  Los precios de los medicamentos varan con frecuencia dependiendo del Environmental consultant de dnde se surte la receta y alguna farmacias pueden ofrecer precios ms baratos.  El sitio web www.goodrx.com tiene cupones para medicamentos de Airline pilot. Los precios aqu no tienen en cuenta lo que podra costar con la ayuda del seguro (puede ser ms barato con su seguro), pero el sitio web puede darle el precio si no utiliz Research scientist (physical sciences).  - Puede imprimir el cupn correspondiente y llevarlo con su receta a la farmacia.  - Tambin puede pasar por nuestra oficina durante el horario de atencin regular y Charity fundraiser una tarjeta de cupones de GoodRx.  -  Si necesita que su receta se enve electrnicamente a una farmacia diferente, informe a nuestra oficina a travs de MyChart de [place] o por telfono llamando al [phone] y presione la opcin 4.  

## 2022-08-30 LAB — CBC WITH DIFFERENTIAL/PLATELET
Basophils Absolute: 0.1 10*3/uL (ref 0.0–0.2)
Basos: 1 %
EOS (ABSOLUTE): 0.5 10*3/uL — ABNORMAL HIGH (ref 0.0–0.4)
Eos: 6 %
Hematocrit: 41.5 % (ref 37.5–51.0)
Hemoglobin: 14.8 g/dL (ref 13.0–17.7)
Immature Grans (Abs): 0 10*3/uL (ref 0.0–0.1)
Immature Granulocytes: 0 %
Lymphocytes Absolute: 1.4 10*3/uL (ref 0.7–3.1)
Lymphs: 20 %
MCH: 31.8 pg (ref 26.6–33.0)
MCHC: 35.7 g/dL (ref 31.5–35.7)
MCV: 89 fL (ref 79–97)
Monocytes Absolute: 0.5 10*3/uL (ref 0.1–0.9)
Monocytes: 7 %
Neutrophils Absolute: 4.6 10*3/uL (ref 1.4–7.0)
Neutrophils: 66 %
Platelets: 141 10*3/uL — ABNORMAL LOW (ref 150–450)
RBC: 4.65 x10E6/uL (ref 4.14–5.80)
RDW: 12.4 % (ref 11.6–15.4)
WBC: 7.1 10*3/uL (ref 3.4–10.8)

## 2022-08-30 LAB — COMPREHENSIVE METABOLIC PANEL
ALT: 137 IU/L — ABNORMAL HIGH (ref 0–44)
AST: 88 IU/L — ABNORMAL HIGH (ref 0–40)
Albumin/Globulin Ratio: 3.4 — ABNORMAL HIGH (ref 1.2–2.2)
Albumin: 5.1 g/dL — ABNORMAL HIGH (ref 3.8–4.9)
Alkaline Phosphatase: 103 IU/L (ref 44–121)
BUN/Creatinine Ratio: 16 (ref 9–20)
BUN: 17 mg/dL (ref 6–24)
Bilirubin Total: 0.7 mg/dL (ref 0.0–1.2)
CO2: 22 mmol/L (ref 20–29)
Calcium: 10.2 mg/dL (ref 8.7–10.2)
Chloride: 103 mmol/L (ref 96–106)
Creatinine, Ser: 1.04 mg/dL (ref 0.76–1.27)
Globulin, Total: 1.5 g/dL (ref 1.5–4.5)
Glucose: 92 mg/dL (ref 70–99)
Potassium: 4.7 mmol/L (ref 3.5–5.2)
Sodium: 143 mmol/L (ref 134–144)
Total Protein: 6.6 g/dL (ref 6.0–8.5)
eGFR: 86 mL/min/{1.73_m2} (ref >59)

## 2022-08-30 LAB — QUANTIFERON-TB GOLD PLUS
QuantiFERON Mitogen Value: >10 IU/mL
QuantiFERON Nil Value: 0.02 IU/mL
QuantiFERON TB1 Ag Value: 0.03 IU/mL
QuantiFERON TB2 Ag Value: 0.03 IU/mL
QuantiFERON-TB Gold Plus: NEGATIVE

## 2022-08-30 LAB — HEPATITIS B SURFACE ANTIGEN: Hepatitis B Surface Ag: NEGATIVE

## 2022-08-30 LAB — HEPATITIS C ANTIBODY: Hep C Virus Ab: NONREACTIVE

## 2022-08-30 LAB — HEPATITIS B SURFACE ANTIBODY,QUALITATIVE: Hep B Surface Ab, Qual: REACTIVE

## 2022-09-01 ENCOUNTER — Telehealth: Payer: Self-pay

## 2022-09-01 ENCOUNTER — Other Ambulatory Visit: Payer: Self-pay

## 2022-09-01 MED ORDER — SKYRIZI PEN 150 MG/ML ~~LOC~~ SOAJ
150.0000 mg | SUBCUTANEOUS | 3 refills | Status: DC
Start: 2022-09-01 — End: 2022-09-15

## 2022-09-01 MED ORDER — SKYRIZI PEN 150 MG/ML ~~LOC~~ SOAJ: 150.0000 mg | SUBCUTANEOUS | 1 refills | Status: DC

## 2022-09-01 NOTE — Telephone Encounter (Signed)
Left pt msg to call for lab results.  Daniel Calderon has been sent to [facility]

## 2022-09-01 NOTE — Telephone Encounter (Signed)
-----   Message from Brendolyn Patty, MD sent at 09/01/2022 10:45 AM EDT ----- CBC/diff is okay, CMP shows elevated liver function tests, which also have been elevated in recent labs.  Could be due to alcohol ingestion?  Hepatitis B/C screen is negative.  He does have Hep B antibodies c/w immunity. This elevated LFTs should be addressed by PCP if not already.  TB screen is negative.   Please send in [place] PA to [place] as per OV  - please call patient

## 2022-09-02 ENCOUNTER — Telehealth: Payer: Self-pay

## 2022-09-02 NOTE — Telephone Encounter (Signed)
-----   Message from Brendolyn Patty, MD sent at 09/01/2022 10:45 AM EDT ----- CBC/diff is okay, CMP shows elevated liver function tests, which also have been elevated in recent labs.  Could be due to alcohol ingestion?  Hepatitis B/C screen is negative.  He does have Hep B antibodies c/w immunity. This elevated LFTs should be addressed by PCP if not already.  TB screen is negative.   Please send in [place] PA to [place] as per OV  - please call patient

## 2022-09-02 NOTE — Telephone Encounter (Signed)
Patient notified of lab results and that Orson Ape was sent in.

## 2022-09-05 ENCOUNTER — Encounter: Payer: Self-pay | Admitting: Pulmonary Disease

## 2022-09-05 ENCOUNTER — Ambulatory Visit: Payer: No Typology Code available for payment source | Admitting: Pulmonary Disease

## 2022-09-05 VITALS — BP 134/80 | HR 79 | Temp 98.5°F | Ht 74.0 in | Wt 222.0 lb

## 2022-09-05 DIAGNOSIS — G4731 Primary central sleep apnea: Secondary | ICD-10-CM | POA: Diagnosis not present

## 2022-09-05 NOTE — Progress Notes (Signed)
[name]  Chief Complaint  Patient presents with   Follow-up    Wearing cpap nightly- pressure and mask is okay.     Past Surgical History:  He  has a past surgical history that includes Left Shoulder Surgery (2011); Scalene node biopsy / excision; Nose surgery; nerve block; Vasectomy; Colonoscopy with propofol (N/A, 11/11/2017); polypectomy (11/11/2017); Knee arthroscopy with medial menisectomy (Left, 07/26/2018); and Esophagogastroduodenoscopy (egd) with propofol (N/A, 08/25/2019).  Past Medical History:  Allergies, Anxiety, BPH, Depression, Headaches, ED, GERD, Nephrolithiasis, HTN, Hypogonadism, Thoracic outlet syndrome  Constitutional:  BP 134/80 (BP Location: Left Arm, Cuff Size: Normal)   Pulse 79   Temp 98.5 F (36.9 C) (Temporal)   Ht '6\' 2"'$  (1.88 m)   Wt 222 lb (100.7 kg)   SpO2 97%   BMI 28.50 kg/m   Brief Summary:  Daniel Calderon is a 56 y.o. male with sleep apnea and asthma.      Subjective:   He had split night study in July 2023.  Showed severe sleep apnea with AHI 80.6.  He had 95 obstructive events, 46 hypopnea, 15 central events, and 14 mixed events.  He did well with CPAP 7 cm H2O.  Developed more central apneas with higher CPAP settings.  He uses CPAP nightly.  No issues with mask fit.  He decreased pressure on his own recently from 8 to 6 cm H2O and feels this is working better.    Breathing okay.  Not having cough, wheeze, sputum, or chest congestion.  He feels like his breathing stops randomly during the day.  He has been seen by Dr. Georg Ruddle at [facility] and is awaiting insurance approval for a phrenic nerve stimulator.   Physical Exam:   Appearance - well kempt   ENMT - no sinus tenderness, no oral exudate, no LAN, Mallampati 4 airway, no stridor, elongated uvula  Respiratory - equal breath sounds bilaterally, no wheezing or rales  CV - s1s2 regular rate and rhythm, no murmurs  Ext - no clubbing, no  edema  Skin - no rashes  Psych - normal mood and affect   Pulmonary testing:  PFT 05/31/19 >> FEV1 4.82 (108%), FEV1% 81, TLC 7.60 (97%), DLCO 74% PFT 06/12/22 >> FEV1 4.57 (104%), FEV1% 83, TLC 7.27 (93%), DLCO 78%, +BD  Sleep Tests:  HST 04/28/19 >> AHI 68.4, SpO2 low 84%  PSG 07/03/22 >> AHI 80.6, SpO2 low 81% CPAP 08/06/22 to 09/04/22 >> used on 30 of 30 nights with average 8 hs 42 min.  Average AHI 5.8 with CPAP 8 cm H2O  Cardiac Tests:  Echo 05/07/22 >> EF 55 to 60%  Social History:  He  reports that he has never smoked. He has never used smokeless tobacco. He reports current alcohol use of about 3.0 standard drinks of alcohol per week. He reports that he does not use drugs.  Family History:  His family history includes Diabetes in his brother; Healthy in his daughter, father, and son; Hearing loss in his maternal grandmother and mother; Heart attack in his paternal uncle; Heart disease in an other family member; Hypertension in his mother; Stroke in his paternal uncle.     Assessment/Plan:   Severe obstructive sleep apnea with treatment emergent central sleep apnea. - he is concerned that he is under-treated, and that he needs Bipap or a phrenic nerve stimulator; he has been seen by Dr. Wendie Chess at [facility] for phrenic nerve stimulator and is awaiting insurance approval -  I explained his current CPAP download shows good control of his sleep apnea - he changed his pressure from 8 to 6 cm H2O - will arrange for overnight oximetry with CPAP and then determine if he needs additional sleep testing - if he continues to have central events with therapy, then he might benefit from ENT assessment and possible uvulopalatopharyngoplasty  Mild, intermittent asthma. - breo was too expensive - prn advair, albuterol    Time Spent Involved in Patient Care on Day of Examination:  51 minutes  Follow up:   Patient Instructions  Will arrange for overnight oxygen test with  CPAP  Follow up in 6 months  Medication List:   Allergies as of 09/05/2022       Reactions   Erythromycin Nausea And Vomiting   Cephalexin Other (See Comments)   Unknown   Dexamethasone Other (See Comments)   Unknown   Oxycodone-acetaminophen Other (See Comments)   Unknown   Prednisone Other (See Comments)   Unknown   Gabapentin Other (See Comments)   Aggressive        Medication List        Accurate as of September 05, 2022 11:13 AM. If you have any questions, ask your nurse or doctor.          Advair Diskus 100-50 MCG/ACT Aepb Generic drug: fluticasone-salmeterol Inhale 1 puff into the lungs 2 (two) times daily.   amLODipine 10 MG tablet Commonly known as: NORVASC TAKE 1 TABLET BY MOUTH DAILY.   atorvastatin 40 MG tablet Commonly known as: LIPITOR Take 1 tablet (40 mg total) by mouth daily at 6 PM.   ibuprofen 600 MG tablet Commonly known as: ADVIL Take 1 tablet (600 mg total) by mouth every 6 (six) hours as needed.   multivitamin tablet Take 1 tablet by mouth daily.   omeprazole 20 MG capsule Commonly known as: PRILOSEC Take one capsule by mouth daily.   Otezla 10 & 20 & 30 MG Tbpk Generic drug: Apremilast Take 1 tab daily and increase per the pack instructions   sertraline 100 MG tablet Commonly known as: ZOLOFT TAKE 1&1/2 TABLETS (150 MG TOTAL) BY MOUTH ONCE DAILY   Skyrizi Pen 150 MG/ML Soaj Generic drug: Risankizumab-rzaa Inject 150 mg into the skin as directed. At weeks 0 & 4.   Skyrizi Pen 150 MG/ML Soaj Generic drug: Risankizumab-rzaa Inject 150 mg into the skin as directed. Every 12 weeks for maintenance.        Signature:  Chesley Mires, MD [place] Pager - [phone] 09/05/2022, 11:13 AM

## 2022-09-05 NOTE — Patient Instructions (Signed)
Will arrange for overnight oxygen test with CPAP  Follow up in 6 months

## 2022-09-09 ENCOUNTER — Other Ambulatory Visit: Payer: Self-pay

## 2022-09-09 DIAGNOSIS — Z9989 Dependence on other enabling machines and devices: Secondary | ICD-10-CM

## 2022-09-12 ENCOUNTER — Other Ambulatory Visit (HOSPITAL_COMMUNITY): Payer: Self-pay

## 2022-09-15 ENCOUNTER — Ambulatory Visit: Payer: No Typology Code available for payment source | Attending: Family Medicine | Admitting: Pharmacist

## 2022-09-15 ENCOUNTER — Ambulatory Visit: Payer: No Typology Code available for payment source | Admitting: Dermatology

## 2022-09-15 ENCOUNTER — Other Ambulatory Visit (HOSPITAL_COMMUNITY): Payer: Self-pay

## 2022-09-15 DIAGNOSIS — L720 Epidermal cyst: Secondary | ICD-10-CM | POA: Diagnosis not present

## 2022-09-15 DIAGNOSIS — Z79899 Other long term (current) drug therapy: Secondary | ICD-10-CM

## 2022-09-15 DIAGNOSIS — D485 Neoplasm of uncertain behavior of skin: Secondary | ICD-10-CM

## 2022-09-15 MED ORDER — SKYRIZI PEN 150 MG/ML ~~LOC~~ SOAJ
150.0000 mg | SUBCUTANEOUS | 1 refills | Status: DC
  Filled 2022-09-15: qty 1, fill #0
  Filled 2022-09-16: qty 1, 28d supply, fill #0
  Filled 2022-10-13: qty 1, 28d supply, fill #1

## 2022-09-15 MED ORDER — SKYRIZI PEN 150 MG/ML ~~LOC~~ SOAJ
150.0000 mg | SUBCUTANEOUS | 3 refills | Status: DC
  Filled 2022-09-15: qty 1, fill #0

## 2022-09-15 NOTE — Patient Instructions (Signed)
Wound Care Instructions for After Surgery  On the day following your surgery, you should begin doing daily dressing changes until your sutures are removed: Remove the bandage. Cleanse the wound gently with soap and water.  Make sure you then dry the skin surrounding the wound completely or the tape will not stick to the skin. Do not use cotton balls on the wound. After the wound is clean and dry, apply the ointment (either prescription antibiotic prescribed by your doctor or plain Vaseline if nothing was prescribed) gently with a Q-tip. If you are using a bandaid to cover: Apply a bandaid large enough to cover the entire wound. If you do not have a bandaid large enough to cover the wound OR if you are sensitive to bandaid adhesive: Cut a non-stick pad (such as Telfa) to fit the size of the wound.  Cover the wound with the non-stick pad. If the wound is draining, you may want to add a small amount of gauze on top of the non-stick pad for a little added compression to the area. Use tape to seal the area completely.  For the next 1-2 weeks: Be sure to keep the wound moist with ointment 24/7 to ensure best healing. If you are unable to cover the wound with a bandage to hold the ointment in place, you may need to reapply the ointment several times a day. Do not bend over or lift heavy items to reduce the chance of elevated blood pressure to the wound. Do not participate in particularly strenuous activities.  Below is a list of dressing supplies you might need.  Cotton-tipped applicators - Q-tips Gauze pads (2x2 and/or 4x4) - All-Purpose Sponges New and clean tube of petroleum jelly (Vaseline) OR prescription antibiotic ointment if prescribed Either a bandaid large enough to cover the entire wound OR non-stick dressing material (Telfa) and Tape (Paper or Hypafix)  FOR ADULT SURGERY PATIENTS: If you need something for pain relief, you may take 1 extra strength Tylenol (acetaminophen) and 2  ibuprofen (200 mg) together every 4 hours as needed. (Do not take these medications if you are allergic to them or if you know you cannot take them for any other reason). Typically you may only need pain medication for 1-3 days.   Comments on the Post-Operative Period Slight swelling and redness often appear around the wound. This is normal and will disappear within several days following the surgery. The healing wound will drain a brownish-red-yellow discharge during healing. This is a normal phase of wound healing. As the wound begins to heal, the drainage may increase in amount. Again, this drainage is normal. Notify us if the drainage becomes persistently bloody, excessively swollen, or intensely painful or develops a foul odor or red streaks.  The healing wound will also typically be itchy. This is normal. If you have severe or persistent pain, Notify us if the discomfort is severe or persistent. Avoid alcoholic beverages when taking pain medicine.  In Case of Wound Hemorrhage A wound hemorrhage is when the bandage suddenly becomes soaked with bright red blood and flows profusely. If this happens, sit down or lie down with your head elevated. If the wound has a dressing on it, do not remove the dressing. Apply pressure to the existing gauze. If the wound is not covered, use a gauze pad to apply pressure and continue applying the pressure for 20 minutes without peeking. DO NOT COVER THE WOUND WITH A LARGE TOWEL OR WASH CLOTH. Release your hand from the   wound site but do not remove the dressing. If the bleeding has stopped, gently clean around the wound. Leave the dressing in place for 24 hours if possible. This wait time allows the blood vessels to close off so that you do not spark a new round of bleeding by disrupting the newly clotted blood vessels with an immediate dressing change. If the bleeding does not subside, continue to hold pressure for 40 minutes. If bleeding continues, page your  physician, contact an After Hours clinic or go to the Emergency Room.  Due to recent changes in healthcare laws, you may see results of your pathology and/or laboratory studies on MyChart before the doctors have had a chance to review them. We understand that in some cases there may be results that are confusing or concerning to you. Please understand that not all results are received at the same time and often the doctors may need to interpret multiple results in order to provide you with the best plan of care or course of treatment. Therefore, we ask that you please give us 2 business days to thoroughly review all your results before contacting the office for clarification. Should we see a critical lab result, you will be contacted sooner.   If You Need Anything After Your Visit  If you have any questions or concerns for your doctor, please call our main line at [phone] and press option 4 to reach your doctor's medical assistant. If no one answers, please leave a voicemail as directed and we will return your call as soon as possible. Messages left after 4 pm will be answered the following business day.   You may also send us a message via MyChart. We typically respond to MyChart messages within 1-2 business days.  For prescription refills, please ask your pharmacy to contact our office. Our fax number is [phone].  If you have an urgent issue when the clinic is closed that cannot wait until the next business day, you can page your doctor at the number below.    Please note that while we do our best to be available for urgent issues outside of office hours, we are not available 24/7.   If you have an urgent issue and are unable to reach us, you may choose to seek medical care at your doctor's office, retail clinic, urgent care center, or emergency room.  If you have a medical emergency, please immediately call 911 or go to the emergency department.  Pager Numbers  - Dr. Kowalski:  [phone]  - Dr. Moye: [phone]  - Dr. Stewart: [phone]  In the event of inclement weather, please call our main line at [phone] for an update on the status of any delays or closures.  Dermatology Medication Tips: Please keep the boxes that topical medications come in in order to help keep track of the instructions about where and how to use these. Pharmacies typically print the medication instructions only on the boxes and not directly on the medication tubes.   If your medication is too expensive, please contact our office at [phone] option 4 or send us a message through MyChart.   We are unable to tell what your co-pay for medications will be in advance as this is different depending on your insurance coverage. However, we may be able to find a substitute medication at lower cost or fill out paperwork to get insurance to cover a needed medication.   If a prior authorization is required to get your medication covered by   your insurance company, please allow us 1-2 business days to complete this process.  Drug prices often vary depending on where the prescription is filled and some pharmacies may offer cheaper prices.  The website www.goodrx.com contains coupons for medications through different pharmacies. The prices here do not account for what the cost may be with help from insurance (it may be cheaper with your insurance), but the website can give you the price if you did not use any insurance.  - You can print the associated coupon and take it with your prescription to the pharmacy.  - You may also stop by our office during regular business hours and pick up a GoodRx coupon card.  - If you need your prescription sent electronically to a different pharmacy, notify our office through [facility] MyChart or by phone at [phone] option 4.     Si Usted Necesita Algo Despus de Su Visita  Tambin puede enviarnos un mensaje a travs de MyChart. Por lo general  respondemos a los mensajes de MyChart en el transcurso de 1 a 2 das hbiles.  Para renovar recetas, por favor pida a su farmacia que se ponga en contacto con nuestra oficina. Nuestro nmero de fax es el [phone].  Si tiene un asunto urgente cuando la clnica est cerrada y que no puede esperar hasta el siguiente da hbil, puede llamar/localizar a su doctor(a) al nmero que aparece a continuacin.   Por favor, tenga en cuenta que aunque hacemos todo lo posible para estar disponibles para asuntos urgentes fuera del horario de oficina, no estamos disponibles las 24 horas del da, los 7 das de la semana.   Si tiene un problema urgente y no puede comunicarse con nosotros, puede optar por buscar atencin mdica  en el consultorio de su doctor(a), en una clnica privada, en un centro de atencin urgente o en una sala de emergencias.  Si tiene una emergencia mdica, por favor llame inmediatamente al 911 o vaya a la sala de emergencias.  Nmeros de bper  - Dr. Kowalski: [phone]  - Dra. Moye: [phone]  - Dra. Stewart: [phone]  En caso de inclemencias del tiempo, por favor llame a nuestra lnea principal al [phone] para una actualizacin sobre el estado de cualquier retraso o cierre.  Consejos para la medicacin en dermatologa: Por favor, guarde las cajas en las que vienen los medicamentos de uso tpico para ayudarle a seguir las instrucciones sobre dnde y cmo usarlos. Las farmacias generalmente imprimen las instrucciones del medicamento slo en las cajas y no directamente en los tubos del medicamento.   Si su medicamento es muy caro, por favor, pngase en contacto con nuestra oficina llamando al [phone] y presione la opcin 4 o envenos un mensaje a travs de MyChart.   No podemos decirle cul ser su copago por los medicamentos por adelantado ya que esto es diferente dependiendo de la cobertura de su seguro. Sin embargo, es posible que podamos encontrar un  medicamento sustituto a menor costo o llenar un formulario para que el seguro cubra el medicamento que se considera necesario.   Si se requiere una autorizacin previa para que su compaa de seguros cubra su medicamento, por favor permtanos de 1 a 2 das hbiles para completar este proceso.  Los precios de los medicamentos varan con frecuencia dependiendo del lugar de dnde se surte la receta y alguna farmacias pueden ofrecer precios ms baratos.  El sitio web www.goodrx.com tiene cupones para medicamentos de diferentes farmacias. Los precios aqu no tienen   en cuenta lo que podra costar con la ayuda del seguro (puede ser ms barato con su seguro), pero el sitio web puede darle el precio si no utiliz ningn seguro.  - Puede imprimir el cupn correspondiente y llevarlo con su receta a la farmacia.  - Tambin puede pasar por nuestra oficina durante el horario de atencin regular y recoger una tarjeta de cupones de GoodRx.  - Si necesita que su receta se enve electrnicamente a una farmacia diferente, informe a nuestra oficina a travs de MyChart de [place] o por telfono llamando al [phone] y presione la opcin 4.  

## 2022-09-15 NOTE — Progress Notes (Signed)
   S: Patient presents for review of their specialty medication therapy.  Patient is about to start taking Skyrizi for psoriasis. Patient is managed by Dr. Nicole Kindred for this. Was seen 08/27/2022 and endorsed GI side effects with Rutherford Nail. Topical steroids have been ineffective.   Adherence: has not started   Efficacy: has not started   Dosing: Plaque psoriasis, moderate to severe: SubQ: Two consecutive injections (75 mg each) for a total dose of 150 mg at weeks 0, 4, and then every 12 weeks thereafter.  Screening: TB test: QF TB Gold negative 08/27/2022 Hep screen negative  Monitoring: S/sx of infection: none  S/sx of hypersensitivity/injection site reaction: none    O:     Lab Results  Component Value Date   WBC 7.1 08/27/2022   HGB 14.8 08/27/2022   HCT 41.5 08/27/2022   MCV 89 08/27/2022   PLT 141 (L) 08/27/2022      Chemistry      Component Value Date/Time   NA 143 08/27/2022 1145   K 4.7 08/27/2022 1145   CL 103 08/27/2022 1145   CO2 22 08/27/2022 1145   BUN 17 08/27/2022 1145   CREATININE 1.04 08/27/2022 1145   CREATININE 1.26 05/24/2021 1600   GLU 100 10/02/2017 0000      Component Value Date/Time   CALCIUM 10.2 08/27/2022 1145   ALKPHOS 103 08/27/2022 1145   AST 88 (H) 08/27/2022 1145   ALT 137 (H) 08/27/2022 1145   BILITOT 0.7 08/27/2022 1145       A/P: 1. Medication review: Patient is about to start on [place] for psoriasis. Reviewed the medication with the patient, including the following: Orson Ape is a monoclonal antibody used in the treatment of psoriasis. Patient educated on purpose, proper use and potential adverse effects of Skyrizi. Possible adverse effects are infections, headache, and injection site reactions. Live vaccinations should be avoided while on therapy. SubQ: Administer the two consecutive injections subcutaneously at different anatomic locations, such as thighs, abdomen, or back of upper arms. Do not inject into areas where the skin is  tender, bruised, red, hard, or affected by psoriasis. Intended for use under supervision of a health care professional; self-injection may occur after proper training (except back of upper arms). No recommendations for any changes at this time.  Benard Halsted, PharmD, [facility] [place], [place] [phone]

## 2022-09-15 NOTE — Progress Notes (Signed)
   Follow-Up Visit   Subjective  Daniel Calderon is a 56 y.o. male who presents for the following: Cyst (Right parietal scalp. Patient presents for excision).   The following portions of the chart were reviewed this encounter and updated as appropriate:       Review of Systems:  No other skin or systemic complaints except as noted in HPI or Assessment and Plan.  Objective  Well appearing patient in no apparent distress; mood and affect are within normal limits.  A focused examination was performed including face, scalp. Relevant physical exam findings are noted in the Assessment and Plan.  Right Parietal Scalp 1.1 cm firm subcutaneous nodule    Assessment & Plan  Neoplasm of uncertain behavior of skin Right Parietal Scalp  Skin excision  Lesion length (cm):  1.1 Lesion width (cm):  1.1 Margin per side (cm):  0.1 Total excision diameter (cm):  1.3 Informed consent: discussed and consent obtained   Timeout: patient name, date of birth, surgical site, and procedure verified   Procedure prep:  Patient was prepped and draped in usual sterile fashion Prep type:  Povidone-iodine Anesthesia: the lesion was anesthetized in a standard fashion   Anesthetic:  1% lidocaine w/ epinephrine 1-100,000 buffered w/ 8.4% NaHCO3 (Total 6cc - 3cc lido w/epi, 3cc bupivicaine) Instrument used: #15 blade   Instrument used comment:  #15c blade Hemostasis achieved with: pressure and electrodesiccation   Outcome: patient tolerated procedure well with no complications   Post-procedure details: sterile dressing applied and wound care instructions given   Dressing type: petrolatum and pressure dressing    Skin repair Complexity:  Intermediate Final length (cm):  0.7 Undermining: edges could be approximated without difficulty and edges undermined   Subcutaneous layers (deep stitches):  Suture type: Vicryl (polyglactin 910)   Stitches:  Buried vertical mattress Fine/surface layer approximation  (top stitches):  Stitches: simple interrupted   Suture removal (days):  7 Hemostasis achieved with: suture Outcome: patient tolerated procedure well with no complications   Post-procedure details: sterile dressing applied and wound care instructions given   Dressing type: pressure dressing (mupirocin)    Specimen 1 - Surgical pathology Differential Diagnosis: Cyst vs other Check Margins: No Firm subcutaneous nodule 1.1 cm   Return in about 1 week (around 09/22/2022) for suture removal.  Documentation: I have reviewed the above documentation for accuracy and completeness, and I agree with the above.  Brendolyn Patty MD

## 2022-09-16 ENCOUNTER — Other Ambulatory Visit (HOSPITAL_COMMUNITY): Payer: Self-pay

## 2022-09-16 ENCOUNTER — Telehealth: Payer: Self-pay

## 2022-09-16 NOTE — Telephone Encounter (Signed)
Talked to patient and he is not having any problems from surgery yesterday.

## 2022-09-22 ENCOUNTER — Ambulatory Visit (INDEPENDENT_AMBULATORY_CARE_PROVIDER_SITE_OTHER): Payer: No Typology Code available for payment source | Admitting: Dermatology

## 2022-09-22 DIAGNOSIS — L720 Epidermal cyst: Secondary | ICD-10-CM

## 2022-09-22 NOTE — Progress Notes (Signed)
   Follow-Up Visit   Subjective  Daniel Calderon is a 56 y.o. male who presents for the following: Post op (Cyst, right parietal scalp).   The following portions of the chart were reviewed this encounter and updated as appropriate:       Review of Systems:  No other skin or systemic complaints except as noted in HPI or Assessment and Plan.  Objective  Well appearing patient in no apparent distress; mood and affect are within normal limits.  A focused examination was performed including face, scalp. Relevant physical exam findings are noted in the Assessment and Plan.  Right Parietal Scalp Healing excision site-  is clean, dry and intact     Assessment & Plan  Epidermoid cyst Right Parietal Scalp  Benign cyst. Wound cleansed and sutures removed. Discussed pathology results.    Return in about 1 month (around 10/23/2022) for Psoriasis, pt gave himself first injection Skrizi today, 4 wks will be 2nd injection.  IJamesetta Orleans, CMA, am acting as scribe for Brendolyn Patty, MD .  Documentation: I have reviewed the above documentation for accuracy and completeness, and I agree with the above.  Brendolyn Patty MD

## 2022-09-22 NOTE — Patient Instructions (Signed)
Due to recent changes in healthcare laws, you may see results of your pathology and/or laboratory studies on MyChart before the doctors have had a chance to review them. We understand that in some cases there may be results that are confusing or concerning to you. Please understand that not all results are received at the same time and often the doctors may need to interpret multiple results in order to provide you with the best plan of care or course of treatment. Therefore, we ask that you please give us 2 business days to thoroughly review all your results before contacting the office for clarification. Should we see a critical lab result, you will be contacted sooner.   If You Need Anything After Your Visit  If you have any questions or concerns for your doctor, please call our main line at [phone] and press option 4 to reach your doctor's medical assistant. If no one answers, please leave a voicemail as directed and we will return your call as soon as possible. Messages left after 4 pm will be answered the following business day.   You may also send us a message via MyChart. We typically respond to MyChart messages within 1-2 business days.  For prescription refills, please ask your pharmacy to contact our office. Our fax number is [phone].  If you have an urgent issue when the clinic is closed that cannot wait until the next business day, you can page your doctor at the number below.    Please note that while we do our best to be available for urgent issues outside of office hours, we are not available 24/7.   If you have an urgent issue and are unable to reach us, you may choose to seek medical care at your doctor's office, retail clinic, urgent care center, or emergency room.  If you have a medical emergency, please immediately call 911 or go to the emergency department.  Pager Numbers  - Dr. Kowalski: [phone]  - Dr. Moye: [phone]  - Dr. Stewart:  [phone]  In the event of inclement weather, please call our main line at [phone] for an update on the status of any delays or closures.  Dermatology Medication Tips: Please keep the boxes that topical medications come in in order to help keep track of the instructions about where and how to use these. Pharmacies typically print the medication instructions only on the boxes and not directly on the medication tubes.   If your medication is too expensive, please contact our office at [phone] option 4 or send us a message through MyChart.   We are unable to tell what your co-pay for medications will be in advance as this is different depending on your insurance coverage. However, we may be able to find a substitute medication at lower cost or fill out paperwork to get insurance to cover a needed medication.   If a prior authorization is required to get your medication covered by your insurance company, please allow us 1-2 business days to complete this process.  Drug prices often vary depending on where the prescription is filled and some pharmacies may offer cheaper prices.  The website www.goodrx.com contains coupons for medications through different pharmacies. The prices here do not account for what the cost may be with help from insurance (it may be cheaper with your insurance), but the website can give you the price if you did not use any insurance.  - You can print the associated coupon and take it with   your prescription to the pharmacy.  - You may also stop by our office during regular business hours and pick up a GoodRx coupon card.  - If you need your prescription sent electronically to a different pharmacy, notify our office through [place] MyChart or by phone at [phone] option 4.     Si Usted Necesita Algo Despus de Su Visita  Tambin puede enviarnos un mensaje a travs de MyChart. Por lo general respondemos a los mensajes de MyChart en el transcurso de 1 a 2  das hbiles.  Para renovar recetas, por favor pida a su farmacia que se ponga en contacto con nuestra oficina. Nuestro nmero de fax es el [phone].  Si tiene un asunto urgente cuando la clnica est cerrada y que no puede esperar hasta el siguiente da hbil, puede llamar/localizar a su doctor(a) al nmero que aparece a continuacin.   Por favor, tenga en cuenta que aunque hacemos todo lo posible para estar disponibles para asuntos urgentes fuera del horario de oficina, no estamos disponibles las 24 horas del da, los 7 das de la semana.   Si tiene un problema urgente y no puede comunicarse con nosotros, puede optar por buscar atencin mdica  en el consultorio de su doctor(a), en una clnica privada, en un centro de atencin urgente o en una sala de emergencias.  Si tiene una emergencia mdica, por favor llame inmediatamente al 911 o vaya a la sala de emergencias.  Nmeros de bper  - Dr. Kowalski: [phone]  - Dra. Moye: [phone]  - Dra. Stewart: [phone]  En caso de inclemencias del tiempo, por favor llame a nuestra lnea principal al [phone] para una actualizacin sobre el estado de cualquier retraso o cierre.  Consejos para la medicacin en dermatologa: Por favor, guarde las cajas en las que vienen los medicamentos de uso tpico para ayudarle a seguir las instrucciones sobre dnde y cmo usarlos. Las farmacias generalmente imprimen las instrucciones del medicamento slo en las cajas y no directamente en los tubos del medicamento.   Si su medicamento es muy caro, por favor, pngase en contacto con nuestra oficina llamando al [phone] y presione la opcin 4 o envenos un mensaje a travs de MyChart.   No podemos decirle cul ser su copago por los medicamentos por adelantado ya que esto es diferente dependiendo de la cobertura de su seguro. Sin embargo, es posible que podamos encontrar un medicamento sustituto a menor costo o llenar un formulario para que el  seguro cubra el medicamento que se considera necesario.   Si se requiere una autorizacin previa para que su compaa de seguros cubra su medicamento, por favor permtanos de 1 a 2 das hbiles para completar este proceso.  Los precios de los medicamentos varan con frecuencia dependiendo del lugar de dnde se surte la receta y alguna farmacias pueden ofrecer precios ms baratos.  El sitio web www.goodrx.com tiene cupones para medicamentos de diferentes farmacias. Los precios aqu no tienen en cuenta lo que podra costar con la ayuda del seguro (puede ser ms barato con su seguro), pero el sitio web puede darle el precio si no utiliz ningn seguro.  - Puede imprimir el cupn correspondiente y llevarlo con su receta a la farmacia.  - Tambin puede pasar por nuestra oficina durante el horario de atencin regular y recoger una tarjeta de cupones de GoodRx.  - Si necesita que su receta se enve electrnicamente a una farmacia diferente, informe a nuestra oficina a travs de MyChart de [place]   o por telfono llamando al [phone] y presione la opcin 4.  

## 2022-09-25 ENCOUNTER — Other Ambulatory Visit (HOSPITAL_COMMUNITY): Payer: Self-pay

## 2022-09-29 ENCOUNTER — Telehealth: Payer: Self-pay

## 2022-09-29 NOTE — Telephone Encounter (Signed)
Await access nurse outcome. Patient should keep his appointment with Joycelyn Schmid if not evaluated prior to then.

## 2022-09-29 NOTE — Telephone Encounter (Signed)
Patient states he believes he has a hernia.  Patient states he is experiencing pain between the left side of his belly button to the top of his groin.  Patient states this started on Friday (09/26/2022).  Patient states he went to [facility] and they are jammed.  Patient has been scheduled to see Mable Paris, FNP tomorrow at 8:30am.    I transferred call to Access Nurse.

## 2022-09-30 ENCOUNTER — Ambulatory Visit (INDEPENDENT_AMBULATORY_CARE_PROVIDER_SITE_OTHER): Payer: No Typology Code available for payment source | Admitting: Family

## 2022-09-30 ENCOUNTER — Other Ambulatory Visit: Payer: Self-pay | Admitting: Family

## 2022-09-30 ENCOUNTER — Ambulatory Visit
Admission: RE | Admit: 2022-09-30 | Discharge: 2022-09-30 | Disposition: A | Discharge status: Home / Self Care | Payer: No Typology Code available for payment source | Source: Ambulatory Visit | Attending: Family | Admitting: Family

## 2022-09-30 ENCOUNTER — Encounter: Payer: Self-pay | Admitting: Family

## 2022-09-30 ENCOUNTER — Telehealth: Payer: Self-pay

## 2022-09-30 ENCOUNTER — Ambulatory Visit: Payer: No Typology Code available for payment source | Admitting: Dermatology

## 2022-09-30 VITALS — BP 130/78 | HR 74 | Temp 98.2°F | Ht 74.0 in | Wt 222.0 lb

## 2022-09-30 DIAGNOSIS — R161 Splenomegaly, not elsewhere classified: Secondary | ICD-10-CM

## 2022-09-30 DIAGNOSIS — R1032 Left lower quadrant pain: Secondary | ICD-10-CM | POA: Diagnosis present

## 2022-09-30 DIAGNOSIS — Z1211 Encounter for screening for malignant neoplasm of colon: Secondary | ICD-10-CM

## 2022-09-30 LAB — CBC WITH DIFFERENTIAL/PLATELET
Basophils Absolute: 0.1 10*3/uL (ref 0.0–0.1)
Basophils Relative: 1 % (ref 0.0–3.0)
Eosinophils Absolute: 0.3 10*3/uL (ref 0.0–0.7)
Eosinophils Relative: 4.5 % (ref 0.0–5.0)
HCT: 43.4 % (ref 39.0–52.0)
Hemoglobin: 15.4 g/dL (ref 13.0–17.0)
Lymphocytes Relative: 18.2 % (ref 12.0–46.0)
Lymphs Abs: 1.3 10*3/uL (ref 0.7–4.0)
MCHC: 35.4 g/dL (ref 30.0–36.0)
MCV: 90.7 fl (ref 78.0–100.0)
Monocytes Absolute: 0.5 10*3/uL (ref 0.1–1.0)
Monocytes Relative: 6.7 % (ref 3.0–12.0)
Neutro Abs: 5.1 10*3/uL (ref 1.4–7.7)
Neutrophils Relative %: 69.6 % (ref 43.0–77.0)
Platelets: 168 10*3/uL (ref 150.0–400.0)
RBC: 4.79 Mil/uL (ref 4.22–5.81)
RDW: 13.4 % (ref 11.5–15.5)
WBC: 7.4 10*3/uL (ref 4.0–10.5)

## 2022-09-30 LAB — COMPREHENSIVE METABOLIC PANEL
ALT: 126 U/L — ABNORMAL HIGH (ref 0–53)
AST: 73 U/L — ABNORMAL HIGH (ref 0–37)
Albumin: 5 g/dL (ref 3.5–5.2)
Alkaline Phosphatase: 96 U/L (ref 39–117)
BUN: 17 mg/dL (ref 6–23)
CO2: 26 mEq/L (ref 19–32)
Calcium: 9.9 mg/dL (ref 8.4–10.5)
Chloride: 103 mEq/L (ref 96–112)
Creatinine, Ser: 1.02 mg/dL (ref 0.40–1.50)
GFR: 83.59 mL/min (ref >60.00)
Glucose, Bld: 102 mg/dL — ABNORMAL HIGH (ref 70–99)
Potassium: 4 mEq/L (ref 3.5–5.1)
Sodium: 139 mEq/L (ref 135–145)
Total Bilirubin: 0.9 mg/dL (ref 0.2–1.2)
Total Protein: 7.1 g/dL (ref 6.0–8.3)

## 2022-09-30 LAB — URINALYSIS, ROUTINE W REFLEX MICROSCOPIC
Bilirubin Urine: NEGATIVE
Hgb urine dipstick: NEGATIVE
Ketones, ur: NEGATIVE
Leukocytes,Ua: NEGATIVE
Nitrite: NEGATIVE
RBC / HPF: NONE SEEN (ref 0–?)
Specific Gravity, Urine: 1.025 (ref 1.000–1.030)
Total Protein, Urine: NEGATIVE
Urine Glucose: NEGATIVE
Urobilinogen, UA: 0.2 (ref 0.0–1.0)
pH: 6 (ref 5.0–8.0)

## 2022-09-30 MED ORDER — IOHEXOL 300 MG/ML  SOLN
100.0000 mL | Freq: Once | INTRAMUSCULAR | Status: AC | PRN
  Administered 2022-09-30: 100 mL via INTRAVENOUS

## 2022-09-30 NOTE — Patient Instructions (Signed)
We have ordered stat imaging of your abdomen and labs.  Please let me know how you are doing.    Again, I would encourage you to consider urology consult for concern for not emptying your bladder completely.  Please arrange a follow-up Dr Caryl Bis to discuss this in further detail

## 2022-09-30 NOTE — Telephone Encounter (Signed)
Daniel Calderon is calling from [facility] Radiology with CT abdomen and pelvis results for patient.  I transferred call to Daniel Calderon, [place].

## 2022-09-30 NOTE — Assessment & Plan Note (Signed)
Patient nontoxic in appearance however he does appear uncomfortable today.  Afebrile.  focal left lower quadrant abdominal pain  on exam.  Differentials include diverticulitis, occult hernia, renal stone, muscle strain.  Discussed history of low back pain and possible musculoskeletal etiology.  Benign low back exam and patient is not experiencing low back or radicular symptoms at this time.  We jointly agreed to defer specific low back imaging at this time.  Discussed at length noctura  and ithis could be a symptom of sleep apnea.  We also discussed concern with regard to not emptying his bladder and I ordered PSA for this reason. He declines prostate exam.   I have advised urology consult for further evaluation with incomplete voiding, he politely declines at this time.  We will focus on acute left lower quadrant pain;  patient will arrange follow-up with PCP.  Pending labs, urinalysis

## 2022-09-30 NOTE — Progress Notes (Signed)
Subjective:    Patient ID: Daniel Calderon, male    DOB: 26-Jun-1966, 56 y.o.   MRN: [id number]  CC: Daniel Calderon is a 56 y.o. male who presents today for an acute visit.    HPI: Left groin pain x 6 days  Moved a blanket warmer with 'pushing and pulling' . He also thought his phone attached to belt may have contributed.   He golfed 5 days ago and pain intensified as he swung the golf club.   He has iced/heat with some relief.   He can feel 'bulge' . Endorses nausea.   He has also noticed over the last couple of months, he has experienced nocturia and doesn't feel he completely empties his bladder.   He describes urinary hesitancy  Stream is not weak.   No dysuria, fever, constipation, vomiting, pain having a bowel movement.     H/o low back pain. He has chronic episodic numbness and describes 'blood pressure cuff on this legs' when going up steps. No low back or leg pain today.   He was seen by cardiology and has VAS [name] lower extremity with exercise , normal per Christell Faith 01/03/2019. Stress test 12/13/2018   H/o OSA. Compliant with Cip[facility]. He follows with pulmonology , last seen 09/05/22 Dr Halford Chessman  07/14/22 seen by Dr Georg Ruddle, cardiology regarding [place] for central sleep apnea  Colonoscopy 11/11/2017 , polyp, hemorrhoid, repeat in 5 years  No history of abdominal surgery  HISTORY:  Past Medical History:  Diagnosis Date   Allergy    Seasonal   Anxiety    Benign enlargement of prostate    Depression    Elevated BP    Elevated transaminase level    Failure of erection    Fatigue    Frequent headaches    GERD (gastroesophageal reflux disease)    History of kidney stones    Hypertension    Hypogonadism in male    Migraine    Sinus congestion 09/01/2017   Sleep apnea    Currently uses cpap   Thoracic outlet syndrome    Left Arm   Tongue pain 02/09/2019   Past Surgical History:  Procedure Laterality Date   COLONOSCOPY WITH PROPOFOL N/A 11/11/2017    Procedure: COLONOSCOPY WITH PROPOFOL;  Surgeon: Lin Landsman, MD;  Location: [facility];  Service: Endoscopy;  Laterality: N/A;   ESOPHAGOGASTRODUODENOSCOPY (EGD) WITH PROPOFOL N/A 08/25/2019   Procedure: ESOPHAGOGASTRODUODENOSCOPY (EGD) WITH PROPOFOL;  Surgeon: Lin Landsman, MD;  Location: [place];  Service: Gastroenterology;  Laterality: N/A;   KNEE ARTHROSCOPY WITH MEDIAL MENISECTOMY Left 07/26/2018   Procedure: KNEE ARTHROSCOPY WITH PARTIAL MEDIAL MENISECTOMY;  Surgeon: Leim Fabry, MD;  Location: [facility];  Service: Orthopedics;  Laterality: Left;   Left Shoulder Surgery  2011   nerve block     2 in neck. 5 in back.   NOSE SURGERY     POLYPECTOMY  11/11/2017   Procedure: POLYPECTOMY;  Surgeon: Lin Landsman, MD;  Location: [facility];  Service: Endoscopy;;   SCALENE NODE BIOPSY / EXCISION     VASECTOMY     Family History  Problem Relation Age of Onset   Hypertension Mother        Living   Hearing loss Mother    Heart disease Other    Healthy Father        Living   Diabetes Brother    Stroke Paternal Uncle    Heart attack Paternal Uncle  Hearing loss Maternal Grandmother    Healthy Son    Healthy Daughter    Kidney cancer Neg Hx    Bladder Cancer Neg Hx    Prostate cancer Neg Hx     Allergies: Erythromycin, Cephalexin, Dexamethasone, Oxycodone-acetaminophen, Prednisone, and Gabapentin Current Outpatient Medications on File Prior to Visit  Medication Sig Dispense Refill   amLODipine (NORVASC) 10 MG tablet TAKE 1 TABLET BY MOUTH DAILY. 90 tablet 2   atorvastatin (LIPITOR) 40 MG tablet Take 1 tablet (40 mg total) by mouth daily at 6 PM. 30 tablet 2   fluticasone-salmeterol (ADVAIR DISKUS) 100-50 MCG/ACT AEPB Inhale 1 puff into the lungs 2 (two) times daily. 60 each 11   ibuprofen (ADVIL) 600 MG tablet Take 1 tablet (600 mg total) by mouth every 6 (six) hours as needed. 30 tablet 0   Multiple Vitamin (MULTIVITAMIN) tablet Take 1  tablet by mouth daily.     omeprazole (PRILOSEC) 20 MG capsule Take one capsule by mouth daily. 90 capsule 1   Risankizumab-rzaa (SKYRIZI PEN) 150 MG/ML SOAJ Inject 150 mg into the skin as directed. At weeks 0 & 4. 1 mL 1   Risankizumab-rzaa (SKYRIZI PEN) 150 MG/ML SOAJ Inject 150 mg into the skin as directed. Every 12 weeks for maintenance. 1 mL 3   sertraline (ZOLOFT) 100 MG tablet TAKE 1&1/2 TABLETS (150 MG TOTAL) BY MOUTH ONCE DAILY 45 tablet 3   No current facility-administered medications on file prior to visit.    Social History   Tobacco Use   Smoking status: Never   Smokeless tobacco: Never  Vaping Use   Vaping Use: Never used  Substance Use Topics   Alcohol use: Yes    Alcohol/week: 3.0 standard drinks of alcohol    Types: 3 Cans of beer per week   Drug use: No    Review of Systems  Constitutional:  Negative for chills and fever.  Respiratory:  Negative for cough.   Cardiovascular:  Negative for chest pain and palpitations.  Gastrointestinal:  Positive for abdominal pain and nausea. Negative for abdominal distention, constipation, diarrhea and vomiting.  Genitourinary:  Positive for difficulty urinating and frequency. Negative for urgency.      Objective:    BP 130/78 (BP Location: Left Arm, Patient Position: Sitting, Cuff Size: Normal)   Pulse 74   Temp 98.2 F (36.8 C) (Oral)   Ht '6\' 2"'$  (1.88 m)   Wt 222 lb (100.7 kg)   SpO2 97%   BMI 28.50 kg/m    Physical Exam Vitals reviewed.  Constitutional:      Appearance: He is well-developed.  Cardiovascular:     Rate and Rhythm: Regular rhythm.     Heart sounds: Normal heart sounds.  Pulmonary:     Effort: Pulmonary effort is normal. No respiratory distress.     Breath sounds: Normal breath sounds. No wheezing, rhonchi or rales.  Abdominal:     General: There is no distension.     Palpations: There is no pulsatile mass.     Tenderness: There is no right CVA tenderness, left CVA tenderness or guarding.  Negative signs include McBurney's sign.       Comments: Left lower quadrant tenderness with palpation.  No obvious mass or bulge appreciated with coughing or Valsalva.  Musculoskeletal:     Lumbar back: No swelling, spasms or tenderness. Normal range of motion.     Comments: Full range of motion with flexion, extension, lateral side bends. No pain, numbness, tingling elicited with  single leg raise bilaterally. No rash.  Skin:    General: Skin is warm and dry.  Neurological:     Mental Status: He is alert.  Psychiatric:        Speech: Speech normal.        Behavior: Behavior normal.        Assessment & Plan:    Problem List Items Addressed This Visit       Other   Left groin pain    Patient nontoxic in appearance however he does appear uncomfortable today.  Afebrile.  focal left lower quadrant abdominal pain  on exam.  Differentials include diverticulitis, occult hernia, renal stone, muscle strain.  Discussed history of low back pain and possible musculoskeletal etiology.  Benign low back exam and patient is not experiencing low back or radicular symptoms at this time.  We jointly agreed to defer specific low back imaging at this time.  Discussed at length noctura  and ithis could be a symptom of sleep apnea.  We also discussed concern with regard to not emptying his bladder and I ordered PSA for this reason. He declines prostate exam.   I have advised urology consult for further evaluation with incomplete voiding, he politely declines at this time.  We will focus on acute left lower quadrant pain;  patient will arrange follow-up with PCP.  Pending labs, urinalysis      Relevant Orders   CBC with Differential/Platelet   Comprehensive metabolic panel   PSA   Urinalysis, Routine w reflex microscopic   Urine Culture   CT ABDOMEN PELVIS W CONTRAST   Other Visit Diagnoses     Left lower quadrant abdominal pain    -  Primary   Relevant Orders   CT ABDOMEN PELVIS W CONTRAST   Screen  for colon cancer       Relevant Orders   Ambulatory referral to Gastroenterology        I am having Lennette Bihari A. Sequeira maintain his multivitamin, atorvastatin, ibuprofen, amLODipine, sertraline, omeprazole, fluticasone-salmeterol, Skyrizi Pen, and Skyrizi Pen.   No orders of the defined types were placed in this encounter.   Return precautions given.   Risks, benefits, and alternatives of the medications and treatment plan prescribed today were discussed, and patient expressed understanding.   Education regarding symptom management and diagnosis given to patient on AVS.  Continue to follow with [name], MD for routine health maintenance.   Daniel Calderon and I agreed with plan.   Mable Paris, FNP

## 2022-09-30 NOTE — Telephone Encounter (Signed)
Spoke to [place]  from [facility] and she informed me of Mr Keefe CT abdomen results. Opened the chart and was able to see them.I passed the message along to [facility] for review.

## 2022-10-01 ENCOUNTER — Telehealth: Payer: Self-pay

## 2022-10-01 ENCOUNTER — Encounter: Payer: Self-pay | Admitting: Family

## 2022-10-01 ENCOUNTER — Other Ambulatory Visit: Payer: Self-pay

## 2022-10-01 DIAGNOSIS — Z86010 Personal history of colonic polyps: Secondary | ICD-10-CM

## 2022-10-01 LAB — URINE CULTURE
MICRO NUMBER:: 14031509
Result:: NO GROWTH
SPECIMEN QUALITY:: ADEQUATE

## 2022-10-01 LAB — PSA: PSA: 0.16 ng/mL (ref 0.10–4.00)

## 2022-10-01 MED ORDER — NA SULFATE-K SULFATE-MG SULF 17.5-3.13-1.6 GM/177ML PO SOLN
1.0000 | Freq: Once | ORAL | 0 refills | Status: AC
  Filled 2022-10-01: qty 354, 1d supply, fill #0

## 2022-10-01 NOTE — Telephone Encounter (Signed)
Gastroenterology Pre-Procedure Review  Request Date: 10/14/22 Requesting Physician: Dr. Marius Ditch  PATIENT REVIEW QUESTIONS: The patient responded to the following health history questions as indicated:    1. Are you having any GI issues? yes (Nausea due to hernia, enlarged spleen) 2. Do you have a personal history of Polyps? yes (polyps Dr. Marius Ditch colonoscopy was 11/11/17) 3. Do you have a family history of Colon Cancer or Polyps? no 4. Diabetes Mellitus? no 5. Joint replacements in the past 12 months?no 6. Major health problems in the past 3 months?ER Visit 07/31/22 chest pain unspecified 7. Any artificial heart valves, MVP, or defibrillator?no    MEDICATIONS & ALLERGIES:    Patient reports the following regarding taking any anticoagulation/antiplatelet therapy:   Plavix, Coumadin, Eliquis, Xarelto, Lovenox, Pradaxa, Brilinta, or Effient? no Aspirin? no  Patient confirms/reports the following medications:  Current Outpatient Medications  Medication Sig Dispense Refill   amLODipine (NORVASC) 10 MG tablet TAKE 1 TABLET BY MOUTH DAILY. 90 tablet 2   atorvastatin (LIPITOR) 40 MG tablet Take 1 tablet (40 mg total) by mouth daily at 6 PM. 30 tablet 2   fluticasone-salmeterol (ADVAIR DISKUS) 100-50 MCG/ACT AEPB Inhale 1 puff into the lungs 2 (two) times daily. 60 each 11   ibuprofen (ADVIL) 600 MG tablet Take 1 tablet (600 mg total) by mouth every 6 (six) hours as needed. 30 tablet 0   Multiple Vitamin (MULTIVITAMIN) tablet Take 1 tablet by mouth daily.     omeprazole (PRILOSEC) 20 MG capsule Take one capsule by mouth daily. 90 capsule 1   Risankizumab-rzaa (SKYRIZI PEN) 150 MG/ML SOAJ Inject 150 mg into the skin as directed. At weeks 0 & 4. 1 mL 1   Risankizumab-rzaa (SKYRIZI PEN) 150 MG/ML SOAJ Inject 150 mg into the skin as directed. Every 12 weeks for maintenance. 1 mL 3   sertraline (ZOLOFT) 100 MG tablet TAKE 1&1/2 TABLETS (150 MG TOTAL) BY MOUTH ONCE DAILY 45 tablet 3   No current  facility-administered medications for this visit.    Patient confirms/reports the following allergies:  Allergies  Allergen Reactions   Erythromycin Nausea And Vomiting   Cephalexin Other (See Comments)    Unknown   Dexamethasone Other (See Comments)    Unknown   Oxycodone-Acetaminophen Other (See Comments)    Unknown   Prednisone Other (See Comments)    Unknown   Gabapentin Other (See Comments)    Aggressive    No orders of the defined types were placed in this encounter.   AUTHORIZATION INFORMATION Primary Insurance: 1D#: Group #:  Secondary Insurance: 1D#: Group #:  SCHEDULE INFORMATION: Date: 10/14/22 Time: Location: [place]

## 2022-10-03 ENCOUNTER — Inpatient Hospital Stay: Payer: No Typology Code available for payment source | Attending: Internal Medicine | Admitting: Internal Medicine

## 2022-10-03 ENCOUNTER — Inpatient Hospital Stay: Payer: No Typology Code available for payment source

## 2022-10-03 ENCOUNTER — Other Ambulatory Visit: Payer: Self-pay

## 2022-10-03 ENCOUNTER — Encounter: Payer: Self-pay | Admitting: Internal Medicine

## 2022-10-03 VITALS — BP 137/93 | HR 80 | Temp 97.8°F | Ht 74.0 in | Wt 222.0 lb

## 2022-10-03 DIAGNOSIS — G4733 Obstructive sleep apnea (adult) (pediatric): Secondary | ICD-10-CM | POA: Insufficient documentation

## 2022-10-03 DIAGNOSIS — R748 Abnormal levels of other serum enzymes: Secondary | ICD-10-CM | POA: Diagnosis not present

## 2022-10-03 DIAGNOSIS — L4050 Arthropathic psoriasis, unspecified: Secondary | ICD-10-CM | POA: Diagnosis not present

## 2022-10-03 DIAGNOSIS — R161 Splenomegaly, not elsewhere classified: Secondary | ICD-10-CM | POA: Diagnosis present

## 2022-10-03 DIAGNOSIS — K760 Fatty (change of) liver, not elsewhere classified: Secondary | ICD-10-CM | POA: Diagnosis not present

## 2022-10-03 NOTE — Progress Notes (Signed)
Patient was seen with pcp for pain lower pelvic pain on left side. Showed enlarged spleen. Patient complains of pain this morning.

## 2022-10-03 NOTE — Progress Notes (Signed)
[facility] NOTE  Patient Care Team: [name], MD as PCP - General (Family Medicine) Pieter Partridge, DO as Consulting Physician (Neurology)  CHIEF COMPLAINTS/PURPOSE OF CONSULTATION:   Oncology History   No history exists.     #Psoriatic arthitis [[place] skin Dr.Stewat]; complex OSA- [central/obstructive]- CPAP  HISTORY OF PRESENTING ILLNESS: Ambulating independently.  Accompanied by his wife.  [name] 56 y.o.  male patient with history of sleep apnea- on CPAP; chronic shortness of breath; has been referred.  Further evaluation recommendations for splenomegaly.  Patient had a recent episode of abdominal pain for which he has been evaluated by GI.  Patient also underwent a CT scan of the abdomen pelvis that showed splenomegaly.  Patient has been evaluated by GI- awaiting colonoscopy.  Of note patient has had a history of chronic episodes of diaphoresis; every night soaking.  However, no weight loss.  Patient always fatigue.  Patient also complains of significant shortness of breath on exertion.  Patient s/p extensive evaluation with cardiology and pulmonary.   Review of Systems  Constitutional:  Positive for diaphoresis and malaise/fatigue. Negative for chills, fever and weight loss.  HENT:  Negative for nosebleeds and sore throat.   Eyes:  Negative for double vision.  Respiratory:  Negative for cough, hemoptysis, sputum production, shortness of breath and wheezing.   Cardiovascular:  Negative for chest pain, palpitations, orthopnea and leg swelling.  Gastrointestinal:  Negative for abdominal pain, blood in stool, constipation, diarrhea, heartburn, melena, nausea and vomiting.  Genitourinary:  Negative for dysuria, frequency and urgency.  Musculoskeletal:  Positive for joint pain. Negative for back pain.  Skin:  Positive for itching and rash.  Neurological:  Negative for dizziness, tingling, focal weakness, weakness and headaches.   Endo/Heme/Allergies:  Does not bruise/bleed easily.  Psychiatric/Behavioral:  Negative for depression. The patient is not nervous/anxious and does not have insomnia.     MEDICAL HISTORY:  Past Medical History:  Diagnosis Date  . Allergy    Seasonal  . Anxiety   . Benign enlargement of prostate   . Depression   . Elevated BP   . Elevated transaminase level   . Failure of erection   . Fatigue   . Frequent headaches   . GERD (gastroesophageal reflux disease)   . History of kidney stones   . Hypertension   . Hypogonadism in male   . Migraine   . Sinus congestion 09/01/2017  . Sleep apnea    Currently uses cpap  . Thoracic outlet syndrome    Left Arm  . Tongue pain 02/09/2019    SURGICAL HISTORY: Past Surgical History:  Procedure Laterality Date  . COLONOSCOPY WITH PROPOFOL N/A 11/11/2017   Procedure: COLONOSCOPY WITH PROPOFOL;  Surgeon: Lin Landsman, MD;  Location: [place];  Service: Endoscopy;  Laterality: N/A;  . ESOPHAGOGASTRODUODENOSCOPY (EGD) WITH PROPOFOL N/A 08/25/2019   Procedure: ESOPHAGOGASTRODUODENOSCOPY (EGD) WITH PROPOFOL;  Surgeon: Lin Landsman, MD;  Location: [facility] ENDOSCOPY;  Service: Gastroenterology;  Laterality: N/A;  . KNEE ARTHROSCOPY WITH MEDIAL MENISECTOMY Left 07/26/2018   Procedure: KNEE ARTHROSCOPY WITH PARTIAL MEDIAL MENISECTOMY;  Surgeon: Leim Fabry, MD;  Location: [facility];  Service: Orthopedics;  Laterality: Left;  . Left Shoulder Surgery  2011  . nerve block     2 in neck. 5 in back.  . NOSE SURGERY    . POLYPECTOMY  11/11/2017   Procedure: POLYPECTOMY;  Surgeon: Lin Landsman, MD;  Location: [place];  Service:  Endoscopy;;  . SCALENE NODE BIOPSY / EXCISION    . VASECTOMY      SOCIAL HISTORY: Social History   Socioeconomic History  . Marital status: Married    Spouse name: Not on file  . Number of children: 2  . Years of education: 78  . Highest education level: Not on file  Occupational  History  . Not on file  Tobacco Use  . Smoking status: Never  . Smokeless tobacco: Never  Vaping Use  . Vaping Use: Never used  Substance and Sexual Activity  . Alcohol use: Yes    Alcohol/week: 3.0 standard drinks of alcohol    Types: 3 Cans of beer per week  . Drug use: No  . Sexual activity: Yes    Partners: Female    Comment: Wife  Other Topics Concern  . Not on file  Social History Narrative   [employer]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    Lives with wife and daughter (25)   High school and tech school   Caffeine- 2-3 coffee    Enjoys- yard work, Location manager- 2 dogs    Right handed   One story home   Social Determinants of Radio broadcast assistant Strain: Not on file  Food Insecurity: Not on file  Transportation Needs: Not on file  Physical Activity: Not on file  Stress: Not on file  Social Connections: Not on file  Intimate Partner Violence: Not on file    FAMILY HISTORY: Family History  Problem Relation Age of Onset  . Hypertension Mother        Living  . Hearing loss Mother   . Heart disease Other   . Healthy Father        Living  . Diabetes Brother   . Stroke Paternal Uncle   . Heart attack Paternal Uncle   . Hearing loss Maternal Grandmother   . Healthy Son   . Healthy Daughter   . Kidney cancer Neg Hx   . Bladder Cancer Neg Hx   . Prostate cancer Neg Hx     ALLERGIES:  is allergic to erythromycin, cephalexin, dexamethasone, oxycodone-acetaminophen, prednisone, and gabapentin.  MEDICATIONS:  Current Outpatient Medications  Medication Sig Dispense Refill  . amLODipine (NORVASC) 10 MG tablet TAKE 1 TABLET BY MOUTH DAILY. 90 tablet 2  . fluticasone-salmeterol (ADVAIR DISKUS) 100-50 MCG/ACT AEPB Inhale 1 puff into the lungs 2 (two) times daily. 60 each 11  . ibuprofen (ADVIL) 600 MG tablet Take 1 tablet (600 mg total) by mouth every 6 (six) hours as needed. 30 tablet 0  . Multiple Vitamin (MULTIVITAMIN) tablet Take 1 tablet by mouth daily.    Marland Kitchen omeprazole  (PRILOSEC) 20 MG capsule Take one capsule by mouth daily. 90 capsule 1  . Risankizumab-rzaa (SKYRIZI PEN) 150 MG/ML SOAJ Inject 150 mg into the skin as directed. At weeks 0 & 4. 1 mL 1  . Risankizumab-rzaa (SKYRIZI PEN) 150 MG/ML SOAJ Inject 150 mg into the skin as directed. Every 12 weeks for maintenance. 1 mL 3  . sertraline (ZOLOFT) 100 MG tablet TAKE 1&1/2 TABLETS (150 MG TOTAL) BY MOUTH ONCE DAILY 45 tablet 3  . atorvastatin (LIPITOR) 40 MG tablet Take 1 tablet (40 mg total) by mouth daily at 6 PM. (Patient not taking: Reported on 10/03/2022) 30 tablet 2  . Na Sulfate-K Sulfate-Mg Sulf 17.5-3.13-1.6 GM/177ML SOLN Take 1 kit by mouth once for 1 dose. 354 mL 0   No current facility-administered medications for this visit.  PHYSICAL EXAMINATION:   Vitals:   10/03/22 1127  BP: (!) 137/93  Pulse: 80  Temp: 97.8 F (36.6 C)   Filed Weights   10/03/22 1127  Weight: 222 lb (100.7 kg)    Physical Exam Vitals and nursing note reviewed.  HENT:     Head: Normocephalic and atraumatic.     Mouth/Throat:     Pharynx: Oropharynx is clear.  Eyes:     Extraocular Movements: Extraocular movements intact.     Pupils: Pupils are equal, round, and reactive to light.  Cardiovascular:     Rate and Rhythm: Normal rate and regular rhythm.  Pulmonary:     Comments: Decreased breath sounds bilaterally.  Abdominal:     Palpations: Abdomen is soft.  Musculoskeletal:        General: Normal range of motion.     Cervical back: Normal range of motion.  Skin:    General: Skin is warm.  Neurological:     General: No focal deficit present.     Mental Status: He is alert and oriented to person, place, and time.  Psychiatric:        Behavior: Behavior normal.        Judgment: Judgment normal.   LABORATORY DATA:  I have reviewed the data as listed Lab Results  Component Value Date   WBC 7.4 09/30/2022   HGB 15.4 09/30/2022   HCT 43.4 09/30/2022   MCV 90.7 09/30/2022   PLT 168.0 09/30/2022    Recent Labs    04/08/22 1155 07/31/22 0743 08/27/22 1145 09/30/22 0911  NA 139 141 143 139  K 3.7 3.9 4.7 4.0  CL 105 108 103 103  CO2 27 24 22 26   GLUCOSE 122* 107* 92 102*  BUN 13 15 17 17   CREATININE 0.92 0.92 1.04 1.02  CALCIUM 9.4 9.7 10.2 9.9  GFRNONAA  --  >60  --   --   PROT 6.8  --  6.6 7.1  ALBUMIN 4.9  --  5.1* 5.0  AST 50*  --  88* 73*  ALT 94*  --  137* 126*  ALKPHOS 88  --  103 96  BILITOT 0.8  --  0.7 0.9    RADIOGRAPHIC STUDIES: I have personally reviewed the radiological images as listed and agreed with the findings in the report. CT ABDOMEN PELVIS W CONTRAST  Result Date: 09/30/2022 CLINICAL DATA:  Abdominal pain. Evaluate for diverticulitis versus renal stone versus enlarged prostate versus hernia. EXAM: CT ABDOMEN AND PELVIS WITH CONTRAST TECHNIQUE: Multidetector CT imaging of the abdomen and pelvis was performed using the standard protocol following bolus administration of intravenous contrast. RADIATION DOSE REDUCTION: This exam was performed according to the departmental dose-optimization program which includes automated exposure control, adjustment of the mA and/or kV according to patient size and/or use of iterative reconstruction technique. CONTRAST:  111m OMNIPAQUE IOHEXOL 300 MG/ML  SOLN COMPARISON:  01/28/2021 FINDINGS: Lower chest: No acute abnormality. Hepatobiliary: No focal liver abnormality. Gallbladder appears within normal limits. No signs of bile duct dilatation. Pancreas: Unremarkable. No pancreatic ductal dilatation or surrounding inflammatory changes. Spleen: The spleen measures 15.5 x 7.9 by 13.9 cm (volume = 890 cm^3). No focal splenic abnormality identified. Adrenals/Urinary Tract: Normal adrenal glands. No nephrolithiasis, hydronephrosis, or mass. No hydroureter or ureteral lithiasis. Bladder appears partially decompressed. No focal abnormality noted. Stomach/Bowel: Stomach appears normal. The appendix is visualized and is within normal  limits. No small bowel wall thickening, inflammation, or distension. Within the left lower quadrant of  the abdomen there is a ovoid fat density structure adjacent to the sigmoid colon measuring 1.8 cm with thin high density rim and surrounding inflammatory fat stranding with thickening of the adjacent peritoneum, image 80/2. Mild adjacent colonic wall thickening is noted. Vascular/Lymphatic: No significant vascular findings are present. No enlarged abdominal or pelvic lymph nodes. Reproductive: Prostate gland with central calcifications measures 3 x 3.9 x 3.8 cm (volume = 20 cm^3). Other: No free fluid.  No fluid collections. Small fat containing left inguinal hernia, image 90/2. Musculoskeletal: No acute or significant osseous findings. Degenerative disc disease is identified at L5-S1. No acute or suspicious osseous findings. IMPRESSION: 1. Within the left lower quadrant of the abdomen there is a ovoid fat density structure adjacent to the sigmoid colon with thin high density rim and surrounding inflammatory fat stranding with thickening of the adjacent peritoneum. Findings are compatible with acute epiploic appendagitis. 2. Splenomegaly. 3. Small fat containing left inguinal hernia. These results will be called to the ordering clinician or representative by the Radiologist Assistant, and communication documented in the PACS or [employer]. Electronically Signed   By: Kerby Moors M.D.   On: 09/30/2022 11:10     Splenomegaly #Mild to moderate splenomegaly [890 cc- CT scan- OCT 2023; similar in 2020]; otherwise no evidence of any abnormal lymphadenopathy.  No evidence of cirrhosis.  Discussed multiple causes of splenomegaly benign versus malignant.  Discussed that although malignant causes like low-grade lymphoma of the spleen is possible, I do not see any evidence of any aggressive lymphoma/leukemia that needs emergent evaluation at this time.  I do not think patient's symptoms of night  sweats/fatigue-related to his mild splenomegaly.  I would recommend holding off further imaging like PET scan or bone marrow biopsy at this time unless the splenomegaly is progressive.  #Fatty liver-elevated AST ALT-discussed regarding cutting down alcohol.  Discussed importance of healthy weight/and weight loss.  Strongly recommend eating more green leafy vegetables and cutting down processed food/ carbohydrates.  Instead increasing whole grains / protein in the diet.  Also patient needs to exercise to lose weight and also improve his overall prognosis from his fatty liver.  #Dyspnea on exertion-s/p multiple evaluations with pulmonary; cardiac; Hx of complex OSA-unfortunately limits his exercise tolerance.  Recommend continued evaluation/treatment as per pulmonary.  Thank you Ms.Arnett. for allowing me to participate in the care of your pleasant patient. Please do not hesitate to contact me with questions or concerns in the interim.    # DISPOSITION: # no labs today # follow up in 6 months- MD; las- cbc/cmp;LDH; 1 week US abdomen prior- Dr.B  # I reviewed the blood work- with the patient in detail; also reviewed the imaging independently [as summarized above]; and with the patient in detail.       # PAIN CONTROL  # GENETICS:  # CLINICAL TRIALS:  # DISPOSITION: # labs today- ordered # Follow up-   Thank you Dr. for allowing me to participate in the care of your pleasant patient. Please do not hesitate to contact me with questions or concerns in the interim.  Above plan of care was discussed with patient/family in detail.  My contact information was given to the patient/family.     Cammie Sickle, MD 10/03/2022 3:57 PM

## 2022-10-03 NOTE — Assessment & Plan Note (Addendum)
#  Mild to moderate splenomegaly [890 cc- CT scan- OCT 2023; similar in 2020]; otherwise no evidence of any abnormal lymphadenopathy.  No evidence of cirrhosis.  Discussed multiple causes of splenomegaly benign versus malignant.  Discussed that although malignant causes like low-grade lymphoma of the spleen is possible, I do not see any evidence of any aggressive lymphoma/leukemia that needs emergent evaluation at this time.  I do not think patient's symptoms of night sweats/fatigue-related to his mild splenomegaly.  I would recommend holding off further imaging like PET scan or bone marrow biopsy at this time unless the splenomegaly is progressive.  #Fatty liver-elevated AST ALT-discussed regarding cutting down alcohol.  Discussed importance of healthy weight/and weight loss.  Strongly recommend eating more green leafy vegetables and cutting down processed food/ carbohydrates.  Instead increasing whole grains / protein in the diet.  Also patient needs to exercise to lose weight and also improve his overall prognosis from his fatty liver.  #Dyspnea on exertion-s/p multiple evaluations with pulmonary; cardiac; Hx of complex OSA-unfortunately limits his exercise tolerance.  Recommend continued evaluation/treatment as per pulmonary.  Thank you Ms.Arnett. for allowing me to participate in the care of your pleasant patient. Please do not hesitate to contact me with questions or concerns in the interim.    # DISPOSITION: # no labs today # follow up in 6 months- MD; las- cbc/cmp;LDH; 1 week US abdomen prior- Dr.B  # I reviewed the blood work- with the patient in detail; also reviewed the imaging independently [as summarized above]; and with the patient in detail.

## 2022-10-06 ENCOUNTER — Encounter: Payer: Self-pay | Admitting: Family Medicine

## 2022-10-06 ENCOUNTER — Ambulatory Visit (INDEPENDENT_AMBULATORY_CARE_PROVIDER_SITE_OTHER): Payer: No Typology Code available for payment source | Admitting: Family Medicine

## 2022-10-06 VITALS — BP 99/60 | HR 83 | Temp 98.3°F | Ht 74.0 in | Wt 220.8 lb

## 2022-10-06 DIAGNOSIS — Z0000 Encounter for general adult medical examination without abnormal findings: Secondary | ICD-10-CM | POA: Insufficient documentation

## 2022-10-06 DIAGNOSIS — E785 Hyperlipidemia, unspecified: Secondary | ICD-10-CM

## 2022-10-06 DIAGNOSIS — Z0001 Encounter for general adult medical examination with abnormal findings: Secondary | ICD-10-CM

## 2022-10-06 LAB — LIPID PANEL
Cholesterol: 187 mg/dL (ref 0–200)
HDL: 34.4 mg/dL — ABNORMAL LOW (ref >39.00)
NonHDL: 153.09
Total CHOL/HDL Ratio: 5
Triglycerides: 219 mg/dL — ABNORMAL HIGH (ref 0.0–149.0)
VLDL: 43.8 mg/dL — ABNORMAL HIGH (ref 0.0–40.0)

## 2022-10-06 LAB — LDL CHOLESTEROL, DIRECT: Direct LDL: 138 mg/dL

## 2022-10-06 NOTE — Progress Notes (Signed)
Tommi Rumps, MD Phone: [phone]  Daniel Calderon is a 56 y.o. male who presents today for CPE.  Diet: has reduced his portions in half, cut out soda in the past week Exercise: limited by his central apnea Colonoscopy: scheduled Prostate cancer screening: UTD Family history-  Prostate cancer: no  Colon cancer: no  Ovarian cancer: possibly his sister, notes she recently had her uterus and ovaries removed for a cancerous process Vaccines-   Flu: through work  Tetanus: UTD  Shingles: due  [place]: x2 HIV screening: UTD Hep C Screening: UTD Tobacco use: no Alcohol use: no alcohol in the past week, notes he was previously drinking 2+ drinks a night Illicit Drug use: no Dentist: yes Ophthalmology: yes Recently seen for left lower quadrant pain.  Found to have epiploic appendagitis.  Also found to have an enlarged spleen.  He has seen hematology for that.  He also has a small fat-containing left inguinal hernia.   Active Ambulatory Problems    Diagnosis Date Noted   Benign fibroma of prostate 03/24/2015   Hypertension 03/24/2015   Elevation of level of transaminase or lactic acid dehydrogenase (LDH) 03/24/2015   Fatigue 03/24/2015   Environmental allergies 04/13/2015   Sleep apnea 04/13/2015   Migraines 04/13/2015   Neck pain 04/13/2015   Adjustment disorder with mixed anxiety and depressed mood 04/13/2015   Psoriasis 04/13/2015   History of kidney stones 04/13/2015   Neuropathy 04/13/2015   Hypogonadism in male 05/24/2015   Erectile disorder, generalized, mild 07/26/2015   Medication refill 09/13/2015   BPH with obstruction/lower urinary tract symptoms 12/03/2015   Dyspnea on exertion 02/19/2017   Language difficulty 02/19/2017   Chest pain 03/21/2017   Muscle strain 09/01/2017   Thoracic outlet syndrome 02/19/2018   Pain in limb 02/19/2018   Acute pain of left knee 04/18/2018   Cutaneous skin tags 04/18/2018   Lower extremity edema 04/18/2018   Excessive  daytime sleepiness 03/28/2019   Cataplexy 03/28/2019   OSA on CPAP 03/28/2019   Hypogammaglobulinemia ([place]) 05/17/2019   Elevated LFTs 08/10/2019   Left flank pain 08/15/2019   Abdominal pain, epigastric    Radiculopathy, cervical and lumbar 12/06/2019   Hyperlipidemia 12/07/2019   Left sided numbness 02/13/2020   Nausea 04/08/2022   Tremor 04/08/2022   ASIS pain 04/08/2022   Asthma 07/21/2022   Left groin pain 09/30/2022   Splenomegaly 10/03/2022   Encounter for general adult medical examination with abnormal findings 10/06/2022   Resolved Ambulatory Problems    Diagnosis Date Noted   Failure of erection 03/24/2015   Encounter to establish care 04/13/2015   Sinusitis, acute maxillary 07/30/2016   Sinus congestion 09/01/2017   Rectal bleeding    Tongue pain 02/09/2019   Stroke-like symptoms s/p tPA 12/07/2019   Past Medical History:  Diagnosis Date   Allergy    Anxiety    Benign enlargement of prostate    Depression    Elevated BP    Elevated transaminase level    Frequent headaches    GERD (gastroesophageal reflux disease)    Migraine     Family History  Problem Relation Age of Onset   Hypertension Mother        Living   Hearing loss Mother    Heart disease Other    Healthy Father        Living   Diabetes Brother    Stroke Paternal Uncle    Heart attack Paternal Uncle    Hearing loss Maternal Grandmother  Healthy Son    Healthy Daughter    Kidney cancer Neg Hx    Bladder Cancer Neg Hx    Prostate cancer Neg Hx     Social History   Socioeconomic History   Marital status: Married    Spouse name: Not on file   Number of children: 2   Years of education: 13   Highest education level: Not on file  Occupational History   Not on file  Tobacco Use   Smoking status: Never   Smokeless tobacco: Never  Vaping Use   Vaping Use: Never used  Substance and Sexual Activity   Alcohol use: Yes    Alcohol/week: 3.0 standard drinks of alcohol    Types: 3  Cans of beer per week   Drug use: No   Sexual activity: Yes    Partners: Female    Comment: Wife  Other Topics Concern   Not on file  Social History Narrative   [employer]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    Lives with wife and daughter (46)   High school and tech school   Caffeine- 2-3 coffee    Enjoys- yard work, Location manager- 2 dogs    Right handed   One story home   Social Determinants of Health   Financial Resource Strain: Not on file  Food Insecurity: Not on file  Transportation Needs: Not on file  Physical Activity: Not on file  Stress: Not on file  Social Connections: Not on file  Intimate Partner Violence: Not on file    ROS patient notes all positives have been ongoing and reports they have been evaluated.  General:  Negative for nexplained weight loss, fever Skin: Negative for new or changing mole, sore that won't heal HEENT: Positive for hoarseness, negative for trouble hearing, trouble seeing, ringing in ears, mouth sores, dysphagia. CV:  Negative for chest pain, edema, palpitations Resp: Positive for dyspnea, negative for cough, hemoptysis GI: Positive for nausea, abdominal pain, negative for vomiting, diarrhea, constipation, melena, hematochezia. GU: Positive for incontinence, sexual difficulty, negative for dysuria, urinary hesitance, hematuria, vaginal or penile discharge, polyuria, lumps in testicle or breasts MSK: Negative for muscle cramps or aches, joint pain or swelling Neuro: Positive for weakness, negative for headaches, numbness, dizziness, passing out/fainting Psych: Negative for depression, anxiety, memory problems  Objective  Physical Exam Vitals:   10/06/22 1350  BP: 99/60  Pulse: 83  Temp: 98.3 F (36.8 C)  SpO2: 97%    BP Readings from Last 3 Encounters:  10/06/22 99/60  10/03/22 (!) 137/93  09/30/22 130/78   Wt Readings from Last 3 Encounters:  10/06/22 220 lb 12.8 oz (100.2 kg)  10/03/22 222 lb (100.7 kg)  09/30/22 222 lb (100.7 kg)     Physical Exam Constitutional:      General: He is not in acute distress.    Appearance: He is not diaphoretic.  HENT:     Head: Normocephalic and atraumatic.  Cardiovascular:     Rate and Rhythm: Normal rate and regular rhythm.     Heart sounds: Normal heart sounds.  Pulmonary:     Effort: Pulmonary effort is normal.     Breath sounds: Normal breath sounds.  Abdominal:     General: Bowel sounds are normal. There is no distension.     Palpations: Abdomen is soft. There is no mass.     Tenderness: There is abdominal tenderness (Left lower quadrant). There is no guarding or rebound.  Musculoskeletal:     Right  lower leg: No edema.     Left lower leg: No edema.  Lymphadenopathy:     Cervical: No cervical adenopathy.  Skin:    General: Skin is warm and dry.  Neurological:     Mental Status: He is alert.  Psychiatric:        Mood and Affect: Mood normal.      Assessment/Plan:   Problem List Items Addressed This Visit     Encounter for general adult medical examination with abnormal findings - Primary    Physical exam completed.  I encouraged healthy diet.  He will try to remain as active as he is able to with his central apnea.  I encouraged him to confirm his history of type of cancer as it could dictate whether or not his family has to have genetic screening.  He will get his flu vaccine through work.  I encouraged him to get the Shingrix vaccine.  He will check with his insurance regarding coverage.  Counseled on not resuming alcohol intake though I did advise that if he does resume that he does not need to discontinue alcohol intake suddenly in the future given risk of withdrawal.  Patient has epiploic appendagitis.  This is typically self-limited with conservative management.  He also has small inguinal hernia.  He notes he will seek a referral for that at some point.  Check lipid panel.      Hyperlipidemia   Relevant Orders   Lipid panel    Return in about 6 months  (around 04/07/2023).   Tommi Rumps, MD [place]

## 2022-10-06 NOTE — Assessment & Plan Note (Signed)
Physical exam completed.  I encouraged healthy diet.  He will try to remain as active as he is able to with his central apnea.  I encouraged him to confirm his history of type of cancer as it could dictate whether or not his family has to have genetic screening.  He will get his flu vaccine through work.  I encouraged him to get the Shingrix vaccine.  He will check with his insurance regarding coverage.  Counseled on not resuming alcohol intake though I did advise that if he does resume that he does not need to discontinue alcohol intake suddenly in the future given risk of withdrawal.  Patient has epiploic appendagitis.  This is typically self-limited with conservative management.  He also has small inguinal hernia.  He notes he will seek a referral for that at some point.  Check lipid panel.

## 2022-10-06 NOTE — Patient Instructions (Signed)
Nice to see you. We will contact you with your cholesterol results. Please continue healthy diet.

## 2022-10-07 ENCOUNTER — Encounter: Payer: Self-pay | Admitting: Gastroenterology

## 2022-10-08 ENCOUNTER — Other Ambulatory Visit (HOSPITAL_COMMUNITY): Payer: Self-pay

## 2022-10-09 ENCOUNTER — Other Ambulatory Visit: Payer: Self-pay

## 2022-10-09 DIAGNOSIS — Z1211 Encounter for screening for malignant neoplasm of colon: Secondary | ICD-10-CM

## 2022-10-09 NOTE — Progress Notes (Signed)
AMB 

## 2022-10-13 ENCOUNTER — Other Ambulatory Visit (HOSPITAL_COMMUNITY): Payer: Self-pay

## 2022-10-14 ENCOUNTER — Other Ambulatory Visit (HOSPITAL_COMMUNITY): Payer: Self-pay

## 2022-10-14 ENCOUNTER — Encounter: Payer: Self-pay | Admitting: Gastroenterology

## 2022-10-14 ENCOUNTER — Ambulatory Visit: Payer: No Typology Code available for payment source | Admitting: General Practice

## 2022-10-14 ENCOUNTER — Other Ambulatory Visit: Payer: Self-pay

## 2022-10-14 ENCOUNTER — Encounter
Admission: RE | Disposition: A | Discharge status: Home / Self Care | Payer: Self-pay | Source: Home / Self Care | Attending: Gastroenterology

## 2022-10-14 ENCOUNTER — Ambulatory Visit
Admission: RE | Admit: 2022-10-14 | Discharge: 2022-10-14 | Disposition: A | Discharge status: Home / Self Care | Payer: No Typology Code available for payment source | Attending: Gastroenterology | Admitting: Gastroenterology

## 2022-10-14 DIAGNOSIS — K219 Gastro-esophageal reflux disease without esophagitis: Secondary | ICD-10-CM | POA: Insufficient documentation

## 2022-10-14 DIAGNOSIS — J45909 Unspecified asthma, uncomplicated: Secondary | ICD-10-CM | POA: Diagnosis not present

## 2022-10-14 DIAGNOSIS — Z79899 Other long term (current) drug therapy: Secondary | ICD-10-CM | POA: Diagnosis not present

## 2022-10-14 DIAGNOSIS — F419 Anxiety disorder, unspecified: Secondary | ICD-10-CM | POA: Insufficient documentation

## 2022-10-14 DIAGNOSIS — G4730 Sleep apnea, unspecified: Secondary | ICD-10-CM | POA: Insufficient documentation

## 2022-10-14 DIAGNOSIS — Z800 Family history of malignant neoplasm of digestive organs: Secondary | ICD-10-CM | POA: Diagnosis not present

## 2022-10-14 DIAGNOSIS — F32A Depression, unspecified: Secondary | ICD-10-CM | POA: Diagnosis not present

## 2022-10-14 DIAGNOSIS — Z86010 Personal history of colonic polyps: Secondary | ICD-10-CM | POA: Diagnosis not present

## 2022-10-14 DIAGNOSIS — I10 Essential (primary) hypertension: Secondary | ICD-10-CM | POA: Diagnosis not present

## 2022-10-14 DIAGNOSIS — Z1211 Encounter for screening for malignant neoplasm of colon: Secondary | ICD-10-CM | POA: Diagnosis present

## 2022-10-14 DIAGNOSIS — G709 Myoneural disorder, unspecified: Secondary | ICD-10-CM | POA: Insufficient documentation

## 2022-10-14 HISTORY — PX: COLONOSCOPY WITH PROPOFOL: SHX5780

## 2022-10-14 HISTORY — DX: Splenomegaly, not elsewhere classified: R16.1

## 2022-10-14 HISTORY — DX: Fatty (change of) liver, not elsewhere classified: K76.0

## 2022-10-14 HISTORY — DX: Primary central sleep apnea: G47.31

## 2022-10-14 HISTORY — DX: Other sleep apnea: G47.39

## 2022-10-14 SURGERY — COLONOSCOPY WITH PROPOFOL
Anesthesia: Monitor Anesthesia Care | Site: Rectum

## 2022-10-14 MED ORDER — STERILE WATER FOR IRRIGATION IR SOLN
Status: DC | PRN
  Administered 2022-10-14: 1

## 2022-10-14 MED ORDER — SODIUM CHLORIDE 0.9 % IV SOLN: INTRAVENOUS | Status: DC

## 2022-10-14 MED ORDER — STERILE WATER FOR IRRIGATION IR SOLN
Status: DC | PRN
  Administered 2022-10-14: 120 mL

## 2022-10-14 MED ORDER — LACTATED RINGERS IV SOLN: INTRAVENOUS | Status: DC

## 2022-10-14 MED ORDER — PROPOFOL 10 MG/ML IV BOLUS
INTRAVENOUS | Status: DC | PRN
  Administered 2022-10-14: 20 mg via INTRAVENOUS
  Administered 2022-10-14 (×3): 50 mg via INTRAVENOUS
  Administered 2022-10-14: 100 mg via INTRAVENOUS

## 2022-10-14 SURGICAL SUPPLY — 25 items
CLIP HMST 235XBRD CATH ROT (MISCELLANEOUS) IMPLANT
CLIP RESOLUTION 360 11X235 (MISCELLANEOUS)
ELECT REM PT RETURN 9FT ADLT (ELECTROSURGICAL)
ELECTRODE REM PT RTRN 9FT ADLT (ELECTROSURGICAL) IMPLANT
FCP ESCP3.2XJMB 240X2.8X (MISCELLANEOUS)
FORCEPS BIOP RAD 4 LRG CAP 4 (CUTTING FORCEPS) IMPLANT
FORCEPS BIOP RJ4 240 W/NDL (MISCELLANEOUS)
FORCEPS ESCP3.2XJMB 240X2.8X (MISCELLANEOUS) IMPLANT
GOWN CVR UNV OPN BCK APRN NK (MISCELLANEOUS) ×2 IMPLANT
GOWN ISOL THUMB LOOP REG UNIV (MISCELLANEOUS) ×2
INJECTOR VARIJECT VIN23 (MISCELLANEOUS) IMPLANT
KIT DEFENDO VALVE AND CONN (KITS) IMPLANT
KIT PRC NS LF DISP ENDO (KITS) ×1 IMPLANT
KIT PROCEDURE OLYMPUS (KITS) ×1
MANIFOLD NEPTUNE II (INSTRUMENTS) ×1 IMPLANT
MARKER SPOT ENDO TATTOO 5ML (MISCELLANEOUS) IMPLANT
PROBE APC STR FIRE (PROBE) IMPLANT
RETRIEVER NET ROTH 2.5X230 LF (MISCELLANEOUS) IMPLANT
SNARE COLD EXACTO (MISCELLANEOUS) IMPLANT
SNARE SHORT THROW 13M SML OVAL (MISCELLANEOUS) IMPLANT
SNARE SHORT THROW 30M LRG OVAL (MISCELLANEOUS) IMPLANT
SNARE SNG USE RND 15MM (INSTRUMENTS) IMPLANT
TRAP ETRAP POLY (MISCELLANEOUS) IMPLANT
VARIJECT INJECTOR VIN23 (MISCELLANEOUS)
WATER STERILE IRR 250ML POUR (IV SOLUTION) ×1 IMPLANT

## 2022-10-14 NOTE — Anesthesia Postprocedure Evaluation (Signed)
Anesthesia Post Note  Patient: Daniel Calderon  Procedure(s) Performed: COLONOSCOPY WITH PROPOFOL (Rectum)  Patient location during evaluation: PACU Anesthesia Type: MAC Level of consciousness: awake and alert Pain management: pain level controlled Vital Signs Assessment: post-procedure vital signs reviewed and stable Respiratory status: spontaneous breathing, nonlabored ventilation, respiratory function stable and patient connected to nasal cannula oxygen Cardiovascular status: stable and blood pressure returned to baseline Postop Assessment: no apparent nausea or vomiting Anesthetic complications: no   No notable events documented.   Last Vitals:  Vitals:   10/14/22 1048  BP: (!) 129/90  Pulse: 70  Resp: 20  Temp: (!) 36.2 C  SpO2: 98%    Last Pain:  Vitals:   10/14/22 1048  TempSrc: Temporal  PainSc: 0-No pain                 Dimas Millin

## 2022-10-14 NOTE — H&P (Signed)
Daniel Darby, MD [address]  [place]  [place], [place] [address]  Main: [phone]  Fax: [phone] Pager: [phone]  Primary Care Physician:  [name], MD Primary Gastroenterologist:  Dr. Cephas Calderon  Pre-Procedure History & Physical: HPI:  Daniel Calderon is a 56 y.o. male is here for an colonoscopy.   Past Medical History:  Diagnosis Date   Allergy    Seasonal   Anxiety    Benign enlargement of prostate    Complex sleep apnea syndrome    Depression    Elevated BP    Elevated transaminase level    Failure of erection    Fatigue    Frequent headaches    GERD (gastroesophageal reflux disease)    History of kidney stones    Hypertension    Hypogonadism in male    Migraine    NAFL (nonalcoholic fatty liver)    Sinus congestion 09/01/2017   Sleep apnea    Currently uses cpap   Splenomegaly    Thoracic outlet syndrome    Left Arm   Tongue pain 02/09/2019    Past Surgical History:  Procedure Laterality Date   COLONOSCOPY WITH PROPOFOL N/A 11/11/2017   Procedure: COLONOSCOPY WITH PROPOFOL;  Surgeon: Lin Landsman, MD;  Location: [place];  Service: Endoscopy;  Laterality: N/A;   ESOPHAGOGASTRODUODENOSCOPY (EGD) WITH PROPOFOL N/A 08/25/2019   Procedure: ESOPHAGOGASTRODUODENOSCOPY (EGD) WITH PROPOFOL;  Surgeon: Lin Landsman, MD;  Location: [facility];  Service: Gastroenterology;  Laterality: N/A;   KNEE ARTHROSCOPY WITH MEDIAL MENISECTOMY Left 07/26/2018   Procedure: KNEE ARTHROSCOPY WITH PARTIAL MEDIAL MENISECTOMY;  Surgeon: Leim Fabry, MD;  Location: [facility];  Service: Orthopedics;  Laterality: Left;   Left Shoulder Surgery  2011   nerve block     2 in neck. 5 in back.   NOSE SURGERY     POLYPECTOMY  11/11/2017   Procedure: POLYPECTOMY;  Surgeon: Lin Landsman, MD;  Location: [facility];  Service: Endoscopy;;   SCALENE NODE BIOPSY / EXCISION     VASECTOMY      Prior to Admission  medications   Medication Sig Start Date End Date Taking? Authorizing Provider  amLODipine (NORVASC) 10 MG tablet TAKE 1 TABLET BY MOUTH DAILY. 01/30/22 01/30/23 Yes Dutch Quint B, FNP  fluticasone-salmeterol (ADVAIR DISKUS) 100-50 MCG/ACT AEPB Inhale 1 puff into the lungs 2 (two) times daily. 07/04/22  Yes Tyler Pita, MD  omeprazole (PRILOSEC) 20 MG capsule Take one capsule by mouth daily. 06/27/22 06/27/23 Yes [name], MD  Risankizumab-rzaa (SKYRIZI PEN) 150 MG/ML SOAJ Inject 150 mg into the skin as directed. At weeks 0 & 4. 09/15/22  Yes Tresa Garter, MD  sertraline (ZOLOFT) 100 MG tablet TAKE 1&1/2 TABLETS (150 MG TOTAL) BY MOUTH ONCE DAILY 05/27/22 05/27/23 Yes [name], MD    Allergies as of 10/01/2022 - Review Complete 10/01/2022  Allergen Reaction Noted   Erythromycin Nausea And Vomiting 03/24/2015   Cephalexin Other (See Comments) 05/07/2015   Dexamethasone Other (See Comments) 05/07/2015   Oxycodone-acetaminophen Other (See Comments) 05/16/2016   Prednisone Other (See Comments) 05/07/2015   Gabapentin Other (See Comments) 06/26/2015    Family History  Problem Relation Age of Onset   Hypertension Mother        Living   Hearing loss Mother    Heart disease Other    Healthy Father        Living   Diabetes Brother  Stroke Paternal Uncle    Heart attack Paternal Uncle    Hearing loss Maternal Grandmother    Healthy Son    Healthy Daughter    Kidney cancer Neg Hx    Bladder Cancer Neg Hx    Prostate cancer Neg Hx     Social History   Socioeconomic History   Marital status: Married    Spouse name: Not on file   Number of children: 2   Years of education: 13   Highest education level: Not on file  Occupational History   Not on file  Tobacco Use   Smoking status: Never   Smokeless tobacco: Never  Vaping Use   Vaping Use: Never used  Substance and Sexual Activity   Alcohol use: Not Currently    Alcohol/week: 14.0 standard drinks of  alcohol    Types: 14 Shots of liquor per week    Comment: 10/07/22  None in last 7 days   Drug use: No   Sexual activity: Yes    Partners: Female    Comment: Wife  Other Topics Concern   Not on file  Social History Narrative   [employer]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    Lives with wife and daughter (76)   High school and tech school   Caffeine- 2-3 coffee    Enjoys- yard work, Location manager- 2 dogs    Right handed   One story home   Social Determinants of Health   Financial Resource Strain: Not on file  Food Insecurity: Not on file  Transportation Needs: Not on file  Physical Activity: Not on file  Stress: Not on file  Social Connections: Not on file  Intimate Partner Violence: Not on file    Review of Systems: See HPI, otherwise negative ROS  Physical Exam: BP (!) 129/90   Pulse 70   Temp (!) 97.2 F (36.2 C) (Temporal)   Resp 20   Ht '6\' 2"'$  (1.88 m)   Wt 95.7 kg   SpO2 98%   BMI 27.09 kg/m  General:   Alert,  pleasant and cooperative in NAD Head:  Normocephalic and atraumatic. Neck:  Supple; no masses or thyromegaly. Lungs:  Clear throughout to auscultation.    Heart:  Regular rate and rhythm. Abdomen:  Soft, nontender and nondistended. Normal bowel sounds, without guarding, and without rebound.   Neurologic:  Alert and  oriented x4;  grossly normal neurologically.  Impression/Plan: Daniel Calderon is here for an colonoscopy to be performed for h/o colon adenoma  Risks, benefits, limitations, and alternatives regarding  colonoscopy have been reviewed with the patient.  Questions have been answered.  All parties agreeable.   Sherri Sear, MD  10/14/2022, 11:19 AM

## 2022-10-14 NOTE — Op Note (Signed)
[facility] Gastroenterology Patient Name: Daniel Calderon Procedure Date: 10/14/2022 12:13 PM MRN: [id number] Account #: [id number] Date of Birth: 1966-02-14 Admit Type: Outpatient Age: 56 Room: [facility] OR ROOM 01 Gender: Male Note Status: Finalized Instrument Name: [id number] Procedure:             Colonoscopy Indications:           Surveillance: Personal history of adenomatous polyps                         on last colonoscopy 5 years ago, Family history of                         colon cancer in a first-degree relative before age 38                         years, Last colonoscopy: November 2018 Providers:             Lin Landsman MD, MD Referring MD:          Angela Adam. Caryl Bis (Referring MD) Medicines:             General Anesthesia Complications:         No immediate complications. Estimated blood loss: None. Procedure:             Pre-Anesthesia Assessment:                        - Prior to the procedure, a History and Physical was                         performed, and patient medications and allergies were                         reviewed. The patient is competent. The risks and                         benefits of the procedure and the sedation options and                         risks were discussed with the patient. All questions                         were answered and informed consent was obtained.                         Patient identification and proposed procedure were                         verified by the physician, the nurse, the                         anesthesiologist, the anesthetist and the technician                         in the pre-procedure area in the procedure room in the                         endoscopy suite. Mental Status Examination: alert and  oriented. Airway Examination: normal oropharyngeal                         airway and neck mobility. Respiratory Examination:                         clear to  auscultation. CV Examination: normal.                         Prophylactic Antibiotics: The patient does not require                         prophylactic antibiotics. Prior Anticoagulants: The                         patient has taken no previous anticoagulant or                         antiplatelet agents. ASA Grade Assessment: II - A                         patient with mild systemic disease. After reviewing                         the risks and benefits, the patient was deemed in                         satisfactory condition to undergo the procedure. The                         anesthesia plan was to use general anesthesia.                         Immediately prior to administration of medications,                         the patient was re-assessed for adequacy to receive                         sedatives. The heart rate, respiratory rate, oxygen                         saturations, blood pressure, adequacy of pulmonary                         ventilation, and response to care were monitored                         throughout the procedure. The physical status of the                         patient was re-assessed after the procedure.                        After obtaining informed consent, the colonoscope was                         passed under direct vision. Throughout the procedure,  the patient's blood pressure, pulse, and oxygen                         saturations were monitored continuously. The                         Colonoscope was introduced through the anus and                         advanced to the the terminal ileum, with                         identification of the appendiceal orifice and IC                         valve. The colonoscopy was performed without                         difficulty. The patient tolerated the procedure well.                         The quality of the bowel preparation was evaluated                         using the  [facility] Bowel Preparation Scale) with                         scores of: Right Colon = 3, Transverse Colon = 3 and                         Left Colon = 3 (entire mucosa seen well with no                         residual staining, small fragments of stool or opaque                         liquid). The total BBPS score equals 9. Findings:      The perianal and digital rectal examinations were normal. Pertinent       negatives include normal sphincter tone and no palpable rectal lesions.      The terminal ileum appeared normal.      The entire examined colon appeared normal.      The retroflexed view of the distal rectum and anal verge was normal and       showed no anal or rectal abnormalities. Impression:            - The examined portion of the ileum was normal.                        - The entire examined colon is normal.                        - The distal rectum and anal verge are normal on                         retroflexion view.                        -  No specimens collected. Recommendation:        - Discharge patient to home (with escort).                        - Resume previous diet today.                        - Continue present medications.                        - Repeat colonoscopy in 5 years for screening purposes. Procedure Code(s):     --- Professional ---                        Z1696, Colorectal cancer screening; colonoscopy on                         individual at high risk Diagnosis Code(s):     --- Professional ---                        Z86.010, Personal history of colonic polyps                        Z80.0, Family history of malignant neoplasm of                         digestive organs CPT copyright 2019 American Medical Association. All rights reserved. The codes documented in this report are preliminary and upon coder review may  be revised to meet current compliance requirements. Dr. Ulyess Mort Lin Landsman MD, MD 10/14/2022 12:52:34  PM This report has been signed electronically. Number of Addenda: 0 Note Initiated On: 10/14/2022 12:13 PM Scope Withdrawal Time: 0 hours 11 minutes 50 seconds  Total Procedure Duration: 0 hours 15 minutes 55 seconds  Estimated Blood Loss:  Estimated blood loss: none.      [facility]

## 2022-10-14 NOTE — Transfer of Care (Signed)
Immediate Anesthesia Transfer of Care Note  Patient: Daniel Calderon  Procedure(s) Performed: COLONOSCOPY WITH PROPOFOL (Rectum)  Patient Location: PACU  Anesthesia Type: MAC  Level of Consciousness: awake, alert  and patient cooperative  Airway and Oxygen Therapy: Patient Spontanous Breathing and Patient connected to supplemental oxygen  Post-op Assessment: Post-op Vital signs reviewed, Patient's Cardiovascular Status Stable, Respiratory Function Stable, Patent Airway and No signs of Nausea or vomiting  Post-op Vital Signs: Reviewed and stable  Complications: No notable events documented.

## 2022-10-14 NOTE — Anesthesia Preprocedure Evaluation (Signed)
Anesthesia Evaluation  Patient identified by MRN, date of birth, ID band Patient awake    Reviewed: Allergy & Precautions, H&P , NPO status , Patient's Chart, lab work & pertinent test results, reviewed documented beta blocker date and time   Airway Mallampati: II   Neck ROM: full    Dental  (+) Poor Dentition   Pulmonary asthma , sleep apnea ,    Pulmonary exam normal        Cardiovascular Exercise Tolerance: Good hypertension, On Medications negative cardio ROS Normal cardiovascular exam Rhythm:regular Rate:Normal     Neuro/Psych  Headaches, PSYCHIATRIC DISORDERS Anxiety Depression  Neuromuscular disease negative neurological ROS  negative psych ROS   GI/Hepatic negative GI ROS, Neg liver ROS, GERD  ,  Endo/Other  negative endocrine ROS  Renal/GU negative Renal ROS  negative genitourinary   Musculoskeletal   Abdominal   Peds  Hematology negative hematology ROS (+)   Anesthesia Other Findings Past Medical History: No date: Allergy     Comment:  Seasonal No date: Anxiety No date: Benign enlargement of prostate No date: Depression No date: Elevated BP No date: Elevated transaminase level No date: Failure of erection No date: Fatigue No date: Frequent headaches No date: GERD (gastroesophageal reflux disease) No date: History of kidney stones No date: Hypertension No date: Hypogonadism in male No date: Migraine 09/01/2017: Sinus congestion No date: Sleep apnea     Comment:  Currently uses cpap No date: Thoracic outlet syndrome     Comment:  Left Arm 02/09/2019: Tongue pain Past Surgical History: 11/11/2017: COLONOSCOPY WITH PROPOFOL; N/A     Comment:  Procedure: COLONOSCOPY WITH PROPOFOL;  Surgeon: Lin Landsman, MD;  Location: [place];                Service: Endoscopy;  Laterality: N/A; 07/26/2018: KNEE ARTHROSCOPY WITH MEDIAL MENISECTOMY; Left     Comment:  Procedure:  KNEE ARTHROSCOPY WITH PARTIAL MEDIAL               MENISECTOMY;  Surgeon: Leim Fabry, MD;  Location: [facility]               [facility];  Service: Orthopedics;  Laterality: Left; 2011: Left Shoulder Surgery No date: nerve block     Comment:  2 in neck. 5 in back. No date: NOSE SURGERY 11/11/2017: POLYPECTOMY     Comment:  Procedure: POLYPECTOMY;  Surgeon: Lin Landsman,               MD;  Location: [place];  Service: Endoscopy;; No date: SCALENE NODE BIOPSY / EXCISION No date: VASECTOMY BMI    Body Mass Index: 26.96 kg/m     Reproductive/Obstetrics negative OB ROS                             Anesthesia Physical  Anesthesia Plan  ASA: II  Anesthesia Plan: General   Post-op Pain Management: Minimal or no pain anticipated   Induction: Intravenous  PONV Risk Score and Plan: 3 and Propofol infusion, TIVA and Ondansetron  Airway Management Planned: Nasal Cannula  Additional Equipment: None  Intra-op Plan:   Post-operative Plan:   Informed Consent: I have reviewed the patients History and Physical, chart, labs and discussed the procedure including the risks, benefits and alternatives for the proposed anesthesia with the patient or authorized representative who has indicated  his/her understanding and acceptance.     Dental Advisory Given  Plan Discussed with: CRNA  Anesthesia Plan Comments: (Discussed risks of anesthesia with patient, including possibility of difficulty with spontaneous ventilation under anesthesia necessitating airway intervention, PONV, and rare risks such as cardiac or respiratory or neurological events, and allergic reactions. Discussed the role of CRNA in patient's perioperative care. Patient understands.)        Anesthesia Quick Evaluation

## 2022-10-15 ENCOUNTER — Other Ambulatory Visit: Payer: Self-pay

## 2022-10-15 ENCOUNTER — Other Ambulatory Visit (HOSPITAL_COMMUNITY): Payer: Self-pay

## 2022-10-15 ENCOUNTER — Encounter: Payer: Self-pay | Admitting: Gastroenterology

## 2022-10-16 ENCOUNTER — Other Ambulatory Visit (HOSPITAL_COMMUNITY): Payer: Self-pay

## 2022-10-17 ENCOUNTER — Other Ambulatory Visit (HOSPITAL_COMMUNITY): Payer: Self-pay

## 2022-10-17 ENCOUNTER — Other Ambulatory Visit: Payer: Self-pay | Admitting: Family

## 2022-10-17 DIAGNOSIS — R7989 Other specified abnormal findings of blood chemistry: Secondary | ICD-10-CM

## 2022-10-18 ENCOUNTER — Other Ambulatory Visit (HOSPITAL_COMMUNITY): Payer: Self-pay

## 2022-10-21 ENCOUNTER — Telehealth: Payer: Self-pay | Admitting: Gastroenterology

## 2022-10-21 NOTE — Telephone Encounter (Signed)
Medical record for [facility].O[facility]  10/14/2022 for colonoscopy were sent to [place] fax: [phone]

## 2022-10-22 ENCOUNTER — Other Ambulatory Visit (HOSPITAL_COMMUNITY): Payer: Self-pay

## 2022-10-23 ENCOUNTER — Other Ambulatory Visit: Payer: Self-pay | Admitting: Pharmacist

## 2022-10-23 ENCOUNTER — Other Ambulatory Visit (HOSPITAL_COMMUNITY): Payer: Self-pay

## 2022-10-23 MED ORDER — SKYRIZI PEN 150 MG/ML ~~LOC~~ SOAJ
150.0000 mg | SUBCUTANEOUS | 3 refills | Status: DC
  Filled 2022-10-23: qty 1, 28d supply, fill #0
  Filled 2022-12-25: qty 1, 84d supply, fill #0

## 2022-11-03 ENCOUNTER — Ambulatory Visit: Payer: No Typology Code available for payment source | Admitting: Dermatology

## 2022-11-12 ENCOUNTER — Other Ambulatory Visit: Payer: Self-pay | Admitting: Family

## 2022-11-12 ENCOUNTER — Other Ambulatory Visit: Payer: Self-pay

## 2022-11-14 ENCOUNTER — Other Ambulatory Visit: Payer: Self-pay

## 2022-11-14 ENCOUNTER — Other Ambulatory Visit: Payer: Self-pay | Admitting: Family Medicine

## 2022-11-14 MED FILL — Amlodipine Besylate Tab 10 MG (Base Equivalent): ORAL | 30 days supply | Qty: 30 | Fill #0 | Status: CN

## 2022-11-15 ENCOUNTER — Other Ambulatory Visit (HOSPITAL_COMMUNITY): Payer: Self-pay

## 2022-11-16 ENCOUNTER — Other Ambulatory Visit: Payer: Self-pay

## 2022-11-17 ENCOUNTER — Other Ambulatory Visit: Payer: Self-pay

## 2022-11-18 ENCOUNTER — Ambulatory Visit: Payer: No Typology Code available for payment source | Admitting: Family Medicine

## 2022-11-19 ENCOUNTER — Telehealth: Payer: No Typology Code available for payment source | Admitting: Physician Assistant

## 2022-11-19 DIAGNOSIS — U071 COVID-19: Secondary | ICD-10-CM | POA: Diagnosis not present

## 2022-11-19 MED ORDER — NIRMATRELVIR/RITONAVIR (PAXLOVID)TABLET: 3.0000 | ORAL_TABLET | Freq: Two times a day (BID) | ORAL | 0 refills | Status: AC

## 2022-11-19 NOTE — Progress Notes (Signed)
Virtual Visit Consent   Daniel Calderon, you are scheduled for a virtual visit with a [facility] provider today. Just as with appointments in the office, your consent must be obtained to participate. Your consent will be active for this visit and any virtual visit you may have with one of our providers in the next 365 days. If you have a MyChart account, a copy of this consent can be sent to you electronically.  As this is a virtual visit, video technology does not allow for your provider to perform a traditional examination. This may limit your provider's ability to fully assess your condition. If your provider identifies any concerns that need to be evaluated in person or the need to arrange testing (such as labs, EKG, etc.), we will make arrangements to do so. Although advances in technology are sophisticated, we cannot ensure that it will always work on either your end or our end. If the connection with a video visit is poor, the visit may have to be switched to a telephone visit. With either a video or telephone visit, we are not always able to ensure that we have a secure connection.  By engaging in this virtual visit, you consent to the provision of healthcare and authorize for your insurance to be billed (if applicable) for the services provided during this visit. Depending on your insurance coverage, you may receive a charge related to this service.  I need to obtain your verbal consent now. Are you willing to proceed with your visit today? Daniel Calderon has provided verbal consent on 11/19/2022 for a virtual visit (video or telephone). Daniel Daring, PA-C  Date: 11/19/2022 9:15 AM  Virtual Visit via Video Note   I, Daniel Calderon, connected with  Daniel Calderon  ([id number], 1966-06-19) on 11/19/22 at  9:00 AM EST by a video-enabled telemedicine application and verified that I am speaking with the correct person using two identifiers.  Location: Patient: Virtual Visit  Location Patient: Home Provider: Virtual Visit Location Provider: Home Office   I discussed the limitations of evaluation and management by telemedicine and the availability of in person appointments. The patient expressed understanding and agreed to proceed.    History of Present Illness: Daniel Calderon is a 56 y.o. who identifies as a male who was assigned male at birth, and is being seen today for Covid 76.  HPI: URI  This is a new problem. Episode onset: Home test is positive for covid 19 today; symptoms started last night. The problem has been gradually worsening. Associated symptoms include congestion, headaches, nausea (mild this morning, now resolved), a plugged ear sensation, sinus pain and a sore throat. Pertinent negatives include no chest pain, coughing, diarrhea, ear pain, rhinorrhea or vomiting. Associated symptoms comments: chills. He has tried acetaminophen for the symptoms. The treatment provided no relief.      Problems:  Patient Active Problem List   Diagnosis Date Noted   History of colonic polyps    Family history of colon cancer    Encounter for general adult medical examination with abnormal findings 10/06/2022   Splenomegaly 10/03/2022   Left groin pain 09/30/2022   Asthma 07/21/2022   Nausea 04/08/2022   Tremor 04/08/2022   ASIS pain 04/08/2022   Left sided numbness 02/13/2020   Hyperlipidemia 12/07/2019   Radiculopathy, cervical and lumbar 12/06/2019   Abdominal pain, epigastric    Left flank pain 08/15/2019   Elevated LFTs 08/10/2019   Hypogammaglobulinemia ([place]) 05/17/2019  Excessive daytime sleepiness 03/28/2019   Cataplexy 03/28/2019   OSA on CPAP 03/28/2019   Acute pain of left knee 04/18/2018   Cutaneous skin tags 04/18/2018   Lower extremity edema 04/18/2018   Thoracic outlet syndrome 02/19/2018   Pain in limb 02/19/2018   Muscle strain 09/01/2017   Chest pain 03/21/2017   Dyspnea on exertion 02/19/2017   Language difficulty 02/19/2017    BPH with obstruction/lower urinary tract symptoms 12/03/2015   Medication refill 09/13/2015   Erectile disorder, generalized, mild 07/26/2015   Hypogonadism in male 05/24/2015   Environmental allergies 04/13/2015   Sleep apnea 04/13/2015   Migraines 04/13/2015   Neck pain 04/13/2015   Adjustment disorder with mixed anxiety and depressed mood 04/13/2015   Psoriasis 04/13/2015   History of kidney stones 04/13/2015   Neuropathy 04/13/2015   Benign fibroma of prostate 03/24/2015   Hypertension 03/24/2015   Elevation of level of transaminase or lactic acid dehydrogenase (LDH) 03/24/2015   Fatigue 03/24/2015    Allergies:  Allergies  Allergen Reactions   Erythromycin Nausea And Vomiting   Cephalexin Other (See Comments)    Unknown   Dexamethasone Other (See Comments)    Unknown   Oxycodone-Acetaminophen Other (See Comments)    Unknown   Prednisone Other (See Comments)    Unknown   Gabapentin Other (See Comments)    Aggressive   Medications:  Current Outpatient Medications:    nirmatrelvir/ritonavir EUA (PAXLOVID) 20 x 150 MG & 10 x '100MG'$  TABS, Take 3 tablets by mouth 2 (two) times daily for 5 days. (Take nirmatrelvir 150 mg two tablets twice daily for 5 days and ritonavir 100 mg one tablet twice daily for 5 days) Patient GFR is 83.5, Disp: 30 tablet, Rfl: 0   amLODipine (NORVASC) 10 MG tablet, Take 1 tablet (10 mg total) by mouth daily., Disp: 30 tablet, Rfl: 11   fluticasone-salmeterol (ADVAIR DISKUS) 100-50 MCG/ACT AEPB, Inhale 1 puff into the lungs 2 (two) times daily., Disp: 60 each, Rfl: 11   omeprazole (PRILOSEC) 20 MG capsule, Take one capsule by mouth daily., Disp: 90 capsule, Rfl: 1   Risankizumab-rzaa (SKYRIZI PEN) 150 MG/ML SOAJ, Inject 150 mg into the skin as directed. At weeks 0 & 4., Disp: 1 mL, Rfl: 1   Risankizumab-rzaa (SKYRIZI PEN) 150 MG/ML SOAJ, Inject 150 mg into the skin as directed. Every 12 weeks for maintenance., Disp: 1 mL, Rfl: 3   sertraline (ZOLOFT)  100 MG tablet, TAKE 1&1/2 TABLETS (150 MG TOTAL) BY MOUTH ONCE DAILY, Disp: 45 tablet, Rfl: 3  Observations/Objective: Patient is well-developed, well-nourished in no acute distress.  Resting comfortably at home.  Head is normocephalic, atraumatic.  No labored breathing.  Speech is clear and coherent with logical content.  Patient is alert and oriented at baseline.    Assessment and Plan: 1. COVID-19 - nirmatrelvir/ritonavir EUA (PAXLOVID) 20 x 150 MG & 10 x '100MG'$  TABS; Take 3 tablets by mouth 2 (two) times daily for 5 days. (Take nirmatrelvir 150 mg two tablets twice daily for 5 days and ritonavir 100 mg one tablet twice daily for 5 days) Patient GFR is 83.5  Dispense: 30 tablet; Refill: 0  - Continue OTC symptomatic management of choice - Will send OTC vitamins and supplement information through AVS - Paxlovid prescribed - Patient enrolled in MyChart symptom monitoring - Push fluids - Rest as needed - Discussed return precautions and when to seek in-person evaluation, sent via AVS as well   Follow Up Instructions: I discussed the assessment  and treatment plan with the patient. The patient was provided an opportunity to ask questions and all were answered. The patient agreed with the plan and demonstrated an understanding of the instructions.  A copy of instructions were sent to the patient via MyChart unless otherwise noted below.    The patient was advised to call back or seek an in-person evaluation if the symptoms worsen or if the condition fails to improve as anticipated.  Time:  I spent 10 minutes with the patient via telehealth technology discussing the above problems/concerns.    Daniel Daring, PA-C

## 2022-11-19 NOTE — Patient Instructions (Signed)
Daniel Calderon, thank you for joining Mar Daring, PA-C for today's virtual visit.  While this provider is not your primary care provider (PCP), if your PCP is located in our provider database this encounter information will be shared with them immediately following your visit.   [facility] account gives you access to today's visit and all your visits, tests, and labs performed at [facility] " click here if you don't have a [facility] account or go to [email or web address]  Consent: (Patient) Daniel Calderon provided verbal consent for this virtual visit at the beginning of the encounter.  Current Medications:  Current Outpatient Medications:    nirmatrelvir/ritonavir EUA (PAXLOVID) 20 x 150 MG & 10 x '100MG'$  TABS, Take 3 tablets by mouth 2 (two) times daily for 5 days. (Take nirmatrelvir 150 mg two tablets twice daily for 5 days and ritonavir 100 mg one tablet twice daily for 5 days) Patient GFR is 83.5, Disp: 30 tablet, Rfl: 0   amLODipine (NORVASC) 10 MG tablet, Take 1 tablet (10 mg total) by mouth daily., Disp: 30 tablet, Rfl: 11   fluticasone-salmeterol (ADVAIR DISKUS) 100-50 MCG/ACT AEPB, Inhale 1 puff into the lungs 2 (two) times daily., Disp: 60 each, Rfl: 11   omeprazole (PRILOSEC) 20 MG capsule, Take one capsule by mouth daily., Disp: 90 capsule, Rfl: 1   Risankizumab-rzaa (SKYRIZI PEN) 150 MG/ML SOAJ, Inject 150 mg into the skin as directed. At weeks 0 & 4., Disp: 1 mL, Rfl: 1   Risankizumab-rzaa (SKYRIZI PEN) 150 MG/ML SOAJ, Inject 150 mg into the skin as directed. Every 12 weeks for maintenance., Disp: 1 mL, Rfl: 3   sertraline (ZOLOFT) 100 MG tablet, TAKE 1&1/2 TABLETS (150 MG TOTAL) BY MOUTH ONCE DAILY, Disp: 45 tablet, Rfl: 3   Medications ordered in this encounter:  Meds ordered this encounter  Medications   nirmatrelvir/ritonavir EUA (PAXLOVID) 20 x 150 MG & 10 x '100MG'$  TABS    Sig: Take 3 tablets by mouth 2 (two) times daily  for 5 days. (Take nirmatrelvir 150 mg two tablets twice daily for 5 days and ritonavir 100 mg one tablet twice daily for 5 days) Patient GFR is 83.5    Dispense:  30 tablet    Refill:  0    Order Specific Question:   Supervising Provider    Answer:   Chase Picket [id number]     *If you need refills on other medications prior to your next appointment, please contact your pharmacy*  Follow-Up: Call back or seek an in-person evaluation if the symptoms worsen or if the condition fails to improve as anticipated.  [name] [phone]  Other Instructions  COVID-19 COVID-19, or coronavirus disease 2019, is an infection that is caused by a new (novel) coronavirus called SARS-CoV-2. COVID-19 can cause many symptoms. In some people, the virus may not cause any symptoms. In others, it may cause mild or severe symptoms. Some people with severe infection develop severe disease. What are the causes? This illness is caused by a virus. The virus may be in the air as tiny specks of fluid (aerosols) or droplets, or it may be on surfaces. You may catch the virus by: Breathing in droplets from an infected person. Droplets can be spread by a person breathing, speaking, singing, coughing, or sneezing. Touching something, like a table or a doorknob, that has virus on it (is contaminated) and then touching your mouth, nose, or eyes. What increases the risk?  Risk for infection: You are more likely to get infected with the COVID-19 virus if: You are within 6 ft (1.8 m) of a person with COVID-19 for 15 minutes or longer. You are providing care for a person who is infected with COVID-19. You are in close personal contact with other people. Close personal contact includes hugging, kissing, or sharing eating or drinking utensils. Risk for serious illness caused by COVID-19: You are more likely to get seriously ill from the COVID-19 virus if: You have cancer. You have a long-term (chronic)  disease, such as: Chronic lung disease. This includes pulmonary embolism, chronic obstructive pulmonary disease, and cystic fibrosis. Long-term disease that lowers your body's ability to fight infection (immunocompromise). Serious cardiac conditions, such as heart failure, coronary artery disease, or cardiomyopathy. Diabetes. Chronic kidney disease. Liver diseases. These include cirrhosis, nonalcoholic fatty liver disease, alcoholic liver disease, or autoimmune hepatitis. You have obesity. You are pregnant or were recently pregnant. You have sickle cell disease. What are the signs or symptoms? Symptoms of this condition can range from mild to severe. Symptoms may appear any time from 2 to 14 days after being exposed to the virus. They include: Fever or chills. Shortness of breath or trouble breathing. Feeling tired or very tired. Headaches, body aches, or muscle aches. Runny or stuffy nose, sneezing, coughing, or sore throat. New loss of taste or smell. This is rare. Some people may also have stomach problems, such as nausea, vomiting, or diarrhea. Other people may not have any symptoms of COVID-19. How is this diagnosed? This condition may be diagnosed by testing samples to check for the COVID-19 virus. The most common tests are the PCR test and the antigen test. Tests may be done in the lab or at home. They include: Using a swab to take a sample of fluid from the back of your nose and throat (nasopharyngeal fluid), from your nose, or from your throat. Testing a sample of saliva from your mouth. Testing a sample of coughed-up mucus from your lungs (sputum). How is this treated? Treatment for COVID-19 infection depends on the severity of the condition. Mild symptoms can be managed at home with rest, fluids, and over-the-counter medicines. Serious symptoms may be treated in a hospital intensive care unit (ICU). Treatment in the ICU may include: Supplemental oxygen. Extra oxygen is given  through a tube in the nose, a face mask, or a hood. Medicines. These may include: Antivirals, such as monoclonal antibodies. These help your body fight off certain viruses that can cause disease. Anti-inflammatories, such as corticosteroids. These reduce inflammation and suppress the immune system. Antithrombotics. These prevent or treat blood clots, if they develop. Convalescent plasma. This helps boost your immune system, if you have an underlying immunosuppressive condition or are getting immunosuppressive treatments. Prone positioning. This means you will lie on your stomach. This helps oxygen to get into your lungs. Infection control measures. If you are at risk for more serious illness caused by COVID-19, your health care provider may prescribe two long-acting monoclonal antibodies, given together every 6 months. How is this prevented? To protect yourself: Use preventive medicine (pre-exposure prophylaxis). You may get pre-exposure prophylaxis if you have moderate or severe immunocompromise. Get vaccinated. Anyone 65 months old or older who meets guidelines can get a COVID-19 vaccine or vaccine series. This includes people who are pregnant or making breast milk (lactating). Get an added dose of COVID-19 vaccine after your first vaccine or vaccine series if you have moderate to severe immunocompromise. This  applies if you have had a solid organ transplant or have been diagnosed with an immunocompromising condition. You should get the added dose 4 weeks after you got the first COVID-19 vaccine or vaccine series. If you get an mRNA vaccine, you will need a 3-dose primary series. If you get the J&J/Janssen vaccine, you will need a 2-dose primary series, with the second dose being an mRNA vaccine. Talk to your health care provider about getting experimental monoclonal antibodies. This treatment is approved under emergency use authorization to prevent severe illness before or after being exposed to  the COVID-19 virus. You may be given monoclonal antibodies if: You have moderate or severe immunocompromise. This includes treatments that lower your immune response. People with immunocompromise may not develop protection against COVID-19 when they are vaccinated. You cannot be vaccinated. You may not get a vaccine if you have a severe allergic reaction to the vaccine or its components. You are not fully vaccinated. You are in a facility where COVID-19 is present and: Are in close contact with a person who is infected with the COVID-19 virus. Are at high risk of being exposed to the COVID-19 virus. You are at risk of illness from new variants of the COVID-19 virus. To protect others: If you have symptoms of COVID-19, take steps to prevent the virus from spreading to others. Stay home. Leave your house only to get medical care. Do not use public transit, if possible. Do not travel while you are sick. Wash your hands often with soap and water for at least 20 seconds. If soap and water are not available, use alcohol-based hand sanitizer. Make sure that all people in your household wash their hands well and often. Cough or sneeze into a tissue or your sleeve or elbow. Do not cough or sneeze into your hand or into the air. Where to find more information Centers for Disease Control and Prevention: CharmCourses.be World Health Organization: https://www.castaneda.info/ Get help right away if: You have trouble breathing. You have pain or pressure in your chest. You are confused. You have bluish lips and fingernails. You have trouble waking from sleep. You have symptoms that get worse. These symptoms may be an emergency. Get help right away. Call 911. Do not wait to see if the symptoms will go away. Do not drive yourself to the hospital. Summary COVID-19 is an infection that is caused by a new coronavirus. Sometimes, there are no symptoms. Other times, symptoms range from mild  to severe. Some people with a severe COVID-19 infection develop severe disease. The virus that causes COVID-19 can spread from person to person through droplets or aerosols from breathing, speaking, singing, coughing, or sneezing. Mild symptoms of COVID-19 can be managed at home with rest, fluids, and over-the-counter medicines. This information is not intended to replace advice given to you by your health care provider. Make sure you discuss any questions you have with your health care provider. Document Revised: 11/26/2021 Document Reviewed: 11/28/2021 Elsevier Patient Education  [place].    If you have been instructed to have an in-person evaluation today at a local Urgent Care facility, please use the link below. It will take you to a list of all of our available [facility], including address, phone number and hours of operation. Please do not delay care.  [facility]  If you or a family member do not have a primary care provider, use the link below to schedule a visit and establish care. When  you choose a [facility] primary care physician or advanced practice provider, you gain a long-term partner in health. Find a Primary Care Provider  Learn more about [facility] in-office and virtual care options: [facility]

## 2022-11-23 ENCOUNTER — Other Ambulatory Visit: Payer: Self-pay

## 2022-11-26 ENCOUNTER — Other Ambulatory Visit: Payer: Self-pay

## 2022-11-27 ENCOUNTER — Other Ambulatory Visit: Payer: Self-pay | Admitting: Family Medicine

## 2022-11-28 ENCOUNTER — Other Ambulatory Visit: Payer: Self-pay | Admitting: Family Medicine

## 2022-11-28 ENCOUNTER — Other Ambulatory Visit: Payer: Self-pay

## 2022-11-28 MED FILL — Amlodipine Besylate Tab 10 MG (Base Equivalent): ORAL | 30 days supply | Qty: 30 | Fill #0 | Status: CN

## 2022-12-01 ENCOUNTER — Other Ambulatory Visit: Payer: Self-pay

## 2022-12-01 ENCOUNTER — Telehealth: Payer: Self-pay

## 2022-12-01 MED ORDER — SERTRALINE HCL 100 MG PO TABS
150.0000 mg | ORAL_TABLET | Freq: Every day | ORAL | 11 refills | Status: DC
  Filled 2022-12-01: qty 45, 30d supply, fill #0

## 2022-12-01 MED ORDER — SERTRALINE HCL 100 MG PO TABS
150.0000 mg | ORAL_TABLET | Freq: Every day | ORAL | 11 refills | Status: DC
  Filled 2022-12-01: qty 45, 30d supply, fill #0
  Filled 2023-01-12: qty 45, 30d supply, fill #1
  Filled 2023-02-24: qty 45, 30d supply, fill #2
  Filled 2023-04-13: qty 45, 30d supply, fill #3
  Filled 2023-05-27: qty 45, 30d supply, fill #4
  Filled 2023-07-13: qty 45, 30d supply, fill #5
  Filled 2023-08-28: qty 45, 30d supply, fill #6
  Filled 2023-10-15: qty 45, 30d supply, fill #7

## 2022-12-01 MED FILL — Amlodipine Besylate Tab 10 MG (Base Equivalent): ORAL | 90 days supply | Qty: 90 | Fill #0 | Status: AC

## 2022-12-01 NOTE — Telephone Encounter (Signed)
Medication refilled to [facility].

## 2022-12-01 NOTE — Telephone Encounter (Signed)
Patient called to state that his medication had not been called into the pharmacy. He went on to state that this was the 3rd time this had happened. He stated the last time he had to have an appointment before his medication was filled. This time he stated he was going to [facility] or a walk in due to the symptoms of nausea , diarrhea and confusion. These symptoms occurred after not taking his medication for 3 days. He stated also that he does not want to go through this again. He informed me that he has been on this medication for 20 yrs. And he should not go without it.

## 2022-12-01 NOTE — Telephone Encounter (Signed)
I called and spoke with the patient and inforemed the patient his medication was sent to the pharmacy and he understood. Joeseph Verville,cma

## 2022-12-01 NOTE — Telephone Encounter (Signed)
Sent to pharmacy 

## 2022-12-25 ENCOUNTER — Other Ambulatory Visit (HOSPITAL_COMMUNITY): Payer: Self-pay

## 2022-12-29 ENCOUNTER — Other Ambulatory Visit: Payer: Self-pay

## 2022-12-30 ENCOUNTER — Other Ambulatory Visit: Payer: Self-pay

## 2022-12-30 ENCOUNTER — Other Ambulatory Visit (HOSPITAL_COMMUNITY): Payer: Self-pay

## 2022-12-31 ENCOUNTER — Other Ambulatory Visit (HOSPITAL_COMMUNITY): Payer: Self-pay

## 2023-01-01 ENCOUNTER — Other Ambulatory Visit: Payer: Self-pay

## 2023-01-02 ENCOUNTER — Other Ambulatory Visit (HOSPITAL_COMMUNITY): Payer: Self-pay

## 2023-01-05 ENCOUNTER — Other Ambulatory Visit: Payer: Self-pay

## 2023-01-12 ENCOUNTER — Other Ambulatory Visit: Payer: Self-pay | Admitting: Family Medicine

## 2023-01-12 ENCOUNTER — Other Ambulatory Visit: Payer: Self-pay

## 2023-01-12 MED ORDER — OMEPRAZOLE 20 MG PO CPDR
DELAYED_RELEASE_CAPSULE | Freq: Every day | ORAL | 1 refills | Status: DC
  Filled 2023-01-12: qty 90, 90d supply, fill #0
  Filled 2023-04-13: qty 90, 90d supply, fill #1

## 2023-02-09 ENCOUNTER — Telehealth: Payer: Self-pay | Admitting: Pulmonary Disease

## 2023-02-09 DIAGNOSIS — G4733 Obstructive sleep apnea (adult) (pediatric): Secondary | ICD-10-CM

## 2023-02-09 NOTE — Telephone Encounter (Signed)
I spoke with the patient, he was trying to get the Remede device but his insurance would not cover it. He is currently using a CPAP machine and it is working good for him with no problems at all. But he said he feels like he is becoming so dependent on the CPAP that he is forgetting to breath when he walks. He thinks that a BiPap would help to resolve this?

## 2023-02-10 NOTE — Telephone Encounter (Signed)
I have put in order for CPAP titration study.

## 2023-02-10 NOTE — Telephone Encounter (Signed)
Daniel Calderon please schedule. Thank You!

## 2023-02-10 NOTE — Telephone Encounter (Signed)
I notified the patient. He is agreeable with the plan. He is aware that someone will reach out to him with an appointment for the Titration Study.

## 2023-02-10 NOTE — Telephone Encounter (Signed)
Please let him know he would need to have a titration study starting on CPAP first, and if this doesn't control his sleep apnea then he would be switched to Bipap.  Please let me know if he is agreeable to this plan, and then I can put in order for titration study.

## 2023-02-11 NOTE — Telephone Encounter (Signed)
I have faxed the form to [facility] for them to get the patient scheduled

## 2023-02-24 ENCOUNTER — Other Ambulatory Visit: Payer: Self-pay

## 2023-02-24 MED FILL — Amlodipine Besylate Tab 10 MG (Base Equivalent): ORAL | 90 days supply | Qty: 90 | Fill #1 | Status: AC

## 2023-03-03 ENCOUNTER — Ambulatory Visit: Payer: 59 | Attending: Otolaryngology

## 2023-03-03 DIAGNOSIS — R400 Somnolence: Secondary | ICD-10-CM | POA: Insufficient documentation

## 2023-03-03 DIAGNOSIS — E669 Obesity, unspecified: Secondary | ICD-10-CM | POA: Insufficient documentation

## 2023-03-03 DIAGNOSIS — I10 Essential (primary) hypertension: Secondary | ICD-10-CM | POA: Diagnosis not present

## 2023-03-03 DIAGNOSIS — J45909 Unspecified asthma, uncomplicated: Secondary | ICD-10-CM | POA: Diagnosis not present

## 2023-03-03 DIAGNOSIS — G4733 Obstructive sleep apnea (adult) (pediatric): Secondary | ICD-10-CM | POA: Diagnosis not present

## 2023-03-03 DIAGNOSIS — Z6827 Body mass index (BMI) 27.0-27.9, adult: Secondary | ICD-10-CM | POA: Insufficient documentation

## 2023-03-04 ENCOUNTER — Ambulatory Visit: Payer: No Typology Code available for payment source | Admitting: Gastroenterology

## 2023-03-11 ENCOUNTER — Telehealth (INDEPENDENT_AMBULATORY_CARE_PROVIDER_SITE_OTHER): Payer: 59 | Admitting: Pulmonary Disease

## 2023-03-11 DIAGNOSIS — G4733 Obstructive sleep apnea (adult) (pediatric): Secondary | ICD-10-CM

## 2023-03-11 NOTE — Telephone Encounter (Signed)
Patient is aware of results/recommendations and voiced her understanding.  He agrees with plan. Bipap order placed. Nothing further needed.

## 2023-03-11 NOTE — Telephone Encounter (Signed)
Will await Dr. Sood's response.  

## 2023-03-11 NOTE — Telephone Encounter (Signed)
He has complex sleep apnea and was not able to tolerate CPAP before.  Please send order to arrange for Bipap 15/10 cm H2O.

## 2023-03-11 NOTE — Telephone Encounter (Signed)
CPAP of 9 cm was adequate Central apneas emerged at higher level of CPAP. BiPAP of 15/10 is preferred

## 2023-03-24 ENCOUNTER — Ambulatory Visit: Payer: 59

## 2023-03-25 ENCOUNTER — Ambulatory Visit: Payer: 59 | Admitting: Dermatology

## 2023-03-25 VITALS — BP 148/94 | HR 73

## 2023-03-25 DIAGNOSIS — Z79899 Other long term (current) drug therapy: Secondary | ICD-10-CM

## 2023-03-25 DIAGNOSIS — L409 Psoriasis, unspecified: Secondary | ICD-10-CM | POA: Diagnosis not present

## 2023-03-25 DIAGNOSIS — L4050 Arthropathic psoriasis, unspecified: Secondary | ICD-10-CM

## 2023-03-25 MED ORDER — BIMZELX 160 MG/ML ~~LOC~~ SOAJ: 2.0000 | SUBCUTANEOUS | 3 refills | Status: DC

## 2023-03-25 NOTE — Progress Notes (Signed)
   Follow-Up Visit   Subjective  Daniel Calderon is a 57 y.o. male who presents for the following: Psoriasis flare of the lower legs x 3 weeks. He is taking Skyrizi injections, started 6 months ago. Last injections 2 1/2 months ago. Not currently using topicals. No recent sickness. Psorisis is coming back and is very bothersome.  He also has joint pain.    The following portions of the chart were reviewed this encounter and updated as appropriate: medications, allergies, medical history  Review of Systems:  No other skin or systemic complaints except as noted in HPI or Assessment and Plan.  Objective  Well appearing patient in no apparent distress; mood and affect are within normal limits.  Areas Examined: Arms, legs, face  Relevant exam findings are noted in the Assessment and Plan.      Assessment & Plan     PSORIASIS with PsA Pink scaly papules BL lower legs; pink scaly plaque right knee and elbows 10% BSA.  Chronic and persistent condition with duration or expected duration over one year. Condition is bothersome/symptomatic for patient. Currently flared on Skyrizi.  Positive joint pain  Treatment Plan: Discussed switching biologic vs starting topical treatment in addition to [employer]. Patient prefers to switch to a different biologic. Start Bimzelx injections   Patient self-injected Bimzelx 160 mg x 2 into left and right abdomen today. Patient tolerated well. Lot [id number] Exp 09/08/2024  [place] [id number] *Not able to document in [facility], IT ticket opened.   Bimzelx 160 mg inject 360 mg on weeks 4, 8, 12, 16 dsp 78mL 3Rf. Rx sent to [facility].  Bimzelx Enrollment Form faxed.   Counseling and coordination of care for severe psoriasis on systemic treatment  psoriasis - severe on systemic treatment.  Psoriasis is a chronic non-curable, but treatable genetic/hereditary disease that may have other systemic features affecting other organ systems such as joints (Psoriatic  Arthritis).  It is linked with heart disease, inflammatory bowel disease, non-alcoholic fatty liver disease, and depression. Significant skin psoriasis and/or psoriatic arthritis may have significant symptoms and affects activities of daily activity and often benefits from systemic treatments.  These systemic treatments have some potential side effects including immunosuppression and require pre-treatment laboratory screening and periodic laboratory monitoring and periodic in person evaluation and monitoring by the attending dermatologist physician (long term medication management).   Reviewed risks of biologics including immunosuppression, infections, injection site reaction, and failure to improve condition. Goal is control of skin condition, not cure.  Some older biologics such as Humira and Enbrel may slightly increase risk of malignancy and may worsen congestive heart failure.  Taltz and Cosentyx may cause inflammatory bowel disease to flare. The use of biologics requires long term medication management, including periodic office visits and monitoring of blood work.    Return in about 4 weeks (around 04/22/2023) for Psoriasis - Bimzelx injection.  IJamesetta Orleans, CMA, am acting as scribe for Brendolyn Patty, MD .   Documentation: I have reviewed the above documentation for accuracy and completeness, and I agree with the above.  Brendolyn Patty, MD

## 2023-03-25 NOTE — Patient Instructions (Signed)
Due to recent changes in healthcare laws, you may see results of your pathology and/or laboratory studies on MyChart before the doctors have had a chance to review them. We understand that in some cases there may be results that are confusing or concerning to you. Please understand that not all results are received at the same time and often the doctors may need to interpret multiple results in order to provide you with the best plan of care or course of treatment. Therefore, we ask that you please give us 2 business days to thoroughly review all your results before contacting the office for clarification. Should we see a critical lab result, you will be contacted sooner.   If You Need Anything After Your Visit  If you have any questions or concerns for your doctor, please call our main line at [phone] and press option 4 to reach your doctor's medical assistant. If no one answers, please leave a voicemail as directed and we will return your call as soon as possible. Messages left after 4 pm will be answered the following business day.   You may also send us a message via MyChart. We typically respond to MyChart messages within 1-2 business days.  For prescription refills, please ask your pharmacy to contact our office. Our fax number is [phone].  If you have an urgent issue when the clinic is closed that cannot wait until the next business day, you can page your doctor at the number below.    Please note that while we do our best to be available for urgent issues outside of office hours, we are not available 24/7.   If you have an urgent issue and are unable to reach us, you may choose to seek medical care at your doctor's office, retail clinic, urgent care center, or emergency room.  If you have a medical emergency, please immediately call 911 or go to the emergency department.  Pager Numbers  - Dr. Kowalski: [phone]  - Dr. Moye: [phone]  - Dr. Stewart:  [phone]  In the event of inclement weather, please call our main line at [phone] for an update on the status of any delays or closures.  Dermatology Medication Tips: Please keep the boxes that topical medications come in in order to help keep track of the instructions about where and how to use these. Pharmacies typically print the medication instructions only on the boxes and not directly on the medication tubes.   If your medication is too expensive, please contact our office at [phone] option 4 or send us a message through MyChart.   We are unable to tell what your co-pay for medications will be in advance as this is different depending on your insurance coverage. However, we may be able to find a substitute medication at lower cost or fill out paperwork to get insurance to cover a needed medication.   If a prior authorization is required to get your medication covered by your insurance company, please allow us 1-2 business days to complete this process.  Drug prices often vary depending on where the prescription is filled and some pharmacies may offer cheaper prices.  The website www.goodrx.com contains coupons for medications through different pharmacies. The prices here do not account for what the cost may be with help from insurance (it may be cheaper with your insurance), but the website can give you the price if you did not use any insurance.  - You can print the associated coupon and take it with   your prescription to the pharmacy.  - You may also stop by our office during regular business hours and pick up a GoodRx coupon card.  - If you need your prescription sent electronically to a different pharmacy, notify our office through [place] MyChart or by phone at [phone] option 4.     Si Usted Necesita Algo Despus de Su Visita  Tambin puede enviarnos un mensaje a travs de MyChart. Por lo general respondemos a los mensajes de MyChart en el transcurso de 1 a 2  das hbiles.  Para renovar recetas, por favor pida a su farmacia que se ponga en contacto con nuestra oficina. Nuestro nmero de fax es el [phone].  Si tiene un asunto urgente cuando la clnica est cerrada y que no puede esperar hasta el siguiente da hbil, puede llamar/localizar a su doctor(a) al nmero que aparece a continuacin.   Por favor, tenga en cuenta que aunque hacemos todo lo posible para estar disponibles para asuntos urgentes fuera del horario de oficina, no estamos disponibles las 24 horas del da, los 7 das de la semana.   Si tiene un problema urgente y no puede comunicarse con nosotros, puede optar por buscar atencin mdica  en el consultorio de su doctor(a), en una clnica privada, en un centro de atencin urgente o en una sala de emergencias.  Si tiene una emergencia mdica, por favor llame inmediatamente al 911 o vaya a la sala de emergencias.  Nmeros de bper  - Dr. Kowalski: [phone]  - Dra. Moye: [phone]  - Dra. Stewart: [phone]  En caso de inclemencias del tiempo, por favor llame a nuestra lnea principal al [phone] para una actualizacin sobre el estado de cualquier retraso o cierre.  Consejos para la medicacin en dermatologa: Por favor, guarde las cajas en las que vienen los medicamentos de uso tpico para ayudarle a seguir las instrucciones sobre dnde y cmo usarlos. Las farmacias generalmente imprimen las instrucciones del medicamento slo en las cajas y no directamente en los tubos del medicamento.   Si su medicamento es muy caro, por favor, pngase en contacto con nuestra oficina llamando al [phone] y presione la opcin 4 o envenos un mensaje a travs de MyChart.   No podemos decirle cul ser su copago por los medicamentos por adelantado ya que esto es diferente dependiendo de la cobertura de su seguro. Sin embargo, es posible que podamos encontrar un medicamento sustituto a menor costo o llenar un formulario para que el  seguro cubra el medicamento que se considera necesario.   Si se requiere una autorizacin previa para que su compaa de seguros cubra su medicamento, por favor permtanos de 1 a 2 das hbiles para completar este proceso.  Los precios de los medicamentos varan con frecuencia dependiendo del lugar de dnde se surte la receta y alguna farmacias pueden ofrecer precios ms baratos.  El sitio web www.goodrx.com tiene cupones para medicamentos de diferentes farmacias. Los precios aqu no tienen en cuenta lo que podra costar con la ayuda del seguro (puede ser ms barato con su seguro), pero el sitio web puede darle el precio si no utiliz ningn seguro.  - Puede imprimir el cupn correspondiente y llevarlo con su receta a la farmacia.  - Tambin puede pasar por nuestra oficina durante el horario de atencin regular y recoger una tarjeta de cupones de GoodRx.  - Si necesita que su receta se enve electrnicamente a una farmacia diferente, informe a nuestra oficina a travs de MyChart de [place]   o por telfono llamando al [phone] y presione la opcin 4.  

## 2023-03-30 ENCOUNTER — Telehealth: Payer: Self-pay

## 2023-03-30 DIAGNOSIS — G4733 Obstructive sleep apnea (adult) (pediatric): Secondary | ICD-10-CM | POA: Diagnosis not present

## 2023-03-30 NOTE — Telephone Encounter (Signed)
Received a fax from [employer] saying they set the patient up with a CPAP today and he will need a follow up with Dr. Craige Cotta between 5/9 and 7/7.  I left a message for the patient to call our office to schedule this appointment.

## 2023-03-31 ENCOUNTER — Other Ambulatory Visit (HOSPITAL_COMMUNITY): Payer: Self-pay

## 2023-03-31 NOTE — Telephone Encounter (Signed)
Appt scheduled 05/13/2023 at 9:00. Patient is aware and voiced his understanding.  Nothing further needed.

## 2023-04-03 ENCOUNTER — Other Ambulatory Visit: Payer: Self-pay

## 2023-04-03 DIAGNOSIS — J019 Acute sinusitis, unspecified: Secondary | ICD-10-CM | POA: Diagnosis not present

## 2023-04-03 DIAGNOSIS — B9689 Other specified bacterial agents as the cause of diseases classified elsewhere: Secondary | ICD-10-CM | POA: Diagnosis not present

## 2023-04-03 DIAGNOSIS — J209 Acute bronchitis, unspecified: Secondary | ICD-10-CM | POA: Diagnosis not present

## 2023-04-03 MED ORDER — PREDNISONE 20 MG PO TABS
20.0000 mg | ORAL_TABLET | Freq: Two times a day (BID) | ORAL | 0 refills | Status: DC
  Filled 2023-04-03: qty 10, 5d supply, fill #0

## 2023-04-03 MED ORDER — BENZONATATE 200 MG PO CAPS
200.0000 mg | ORAL_CAPSULE | Freq: Three times a day (TID) | ORAL | 0 refills | Status: DC | PRN
  Filled 2023-04-03: qty 20, 7d supply, fill #0

## 2023-04-03 MED ORDER — AMOXICILLIN-POT CLAVULANATE 875-125 MG PO TABS
1.0000 | ORAL_TABLET | Freq: Two times a day (BID) | ORAL | 0 refills | Status: DC
  Filled 2023-04-03: qty 20, 10d supply, fill #0

## 2023-04-06 ENCOUNTER — Inpatient Hospital Stay: Payer: 59 | Admitting: Internal Medicine

## 2023-04-06 ENCOUNTER — Inpatient Hospital Stay: Payer: 59

## 2023-04-07 ENCOUNTER — Ambulatory Visit: Payer: No Typology Code available for payment source | Admitting: Family Medicine

## 2023-04-08 ENCOUNTER — Telehealth: Payer: Self-pay | Admitting: Pharmacist

## 2023-04-08 ENCOUNTER — Other Ambulatory Visit (HOSPITAL_COMMUNITY): Payer: Self-pay

## 2023-04-08 ENCOUNTER — Other Ambulatory Visit: Payer: Self-pay

## 2023-04-08 NOTE — Telephone Encounter (Signed)
Called patient to schedule an appointment for the  [facility]. I was unable to reach the patient so I left a HIPAA-compliant message requesting that the patient return my call.   Luke Van Ausdall, PharmD, BCACP, CPP Clinical Pharmacist [facility] [phone]  

## 2023-04-09 ENCOUNTER — Other Ambulatory Visit (HOSPITAL_COMMUNITY): Payer: Self-pay

## 2023-04-13 ENCOUNTER — Other Ambulatory Visit: Payer: Self-pay

## 2023-04-14 ENCOUNTER — Other Ambulatory Visit: Payer: Self-pay

## 2023-04-14 ENCOUNTER — Other Ambulatory Visit (HOSPITAL_COMMUNITY): Payer: Self-pay

## 2023-04-14 MED ORDER — BIMZELX 160 MG/ML ~~LOC~~ SOAJ: SUBCUTANEOUS | 3 refills | Status: DC

## 2023-04-16 ENCOUNTER — Telehealth: Payer: Self-pay

## 2023-04-16 ENCOUNTER — Other Ambulatory Visit (HOSPITAL_COMMUNITY): Payer: Self-pay

## 2023-04-16 NOTE — Telephone Encounter (Signed)
This patient called and stated that he doesn't want to continue with the Bimzelix. He didn't go into much detail, but just said that he doesn't like the way that its making him feel. Patient has a followup appointment on 04/22/23 to see you again. Please advise if there is anything further that I need to do concerning this?

## 2023-04-17 ENCOUNTER — Other Ambulatory Visit (HOSPITAL_COMMUNITY): Payer: Self-pay

## 2023-04-20 ENCOUNTER — Other Ambulatory Visit (HOSPITAL_COMMUNITY): Payer: Self-pay

## 2023-04-22 ENCOUNTER — Other Ambulatory Visit (HOSPITAL_COMMUNITY): Payer: Self-pay

## 2023-04-22 ENCOUNTER — Ambulatory Visit: Payer: 59 | Admitting: Dermatology

## 2023-04-22 VITALS — BP 126/83 | HR 71

## 2023-04-22 DIAGNOSIS — L409 Psoriasis, unspecified: Secondary | ICD-10-CM

## 2023-04-22 DIAGNOSIS — L4050 Arthropathic psoriasis, unspecified: Secondary | ICD-10-CM | POA: Diagnosis not present

## 2023-04-22 DIAGNOSIS — Z79899 Other long term (current) drug therapy: Secondary | ICD-10-CM | POA: Diagnosis not present

## 2023-04-22 DIAGNOSIS — Z7189 Other specified counseling: Secondary | ICD-10-CM

## 2023-04-22 MED ORDER — TREMFYA 100 MG/ML ~~LOC~~ SOAJ: 100.0000 mg | SUBCUTANEOUS | 5 refills | Status: DC

## 2023-04-22 MED ORDER — TREMFYA 100 MG/ML ~~LOC~~ SOAJ: 100.0000 mg | SUBCUTANEOUS | 0 refills | Status: DC

## 2023-04-22 NOTE — Progress Notes (Signed)
   Follow-Up Visit   Subjective  Daniel Calderon is a 57 y.o. male who presents for the following: Psoriasis 1 month follow-up. Patient started Bimzelx last visit. Skin improved, but patient states that he had bad mood changes and does not want to continue. He He was previously on [employer], but flared before next injection was due. He also tried [place] but had GI upset. He is not using any topicals right now. He wants to try a different biologic.    The following portions of the chart were reviewed this encounter and updated as appropriate: medications, allergies, medical history  Review of Systems:  No other skin or systemic complaints except as noted in HPI or Assessment and Plan.  Objective  Well appearing patient in no apparent distress; mood and affect are within normal limits.  Areas Examined: face, arms, legs   Relevant exam findings are noted in the Assessment and Plan.      Assessment & Plan    PSORIASIS WITH PsA  Pink scaly plaque on right knee; pink scaly papules on the lower legs, arms.  Psoriasis has improved significantly since got initial loading Bimzelx injections. 6% BSA.  Chronic and persistent condition with duration or expected duration over one year. Condition is bothersome/symptomatic for patient. Currently flared.  Improved with Bimzelx but unable to tolerate due to side effects.  Patient with positive joint pain.  Treatment Plan: d/c Bimzelx due to side effects, anger issues Plan to start Tremfya pending insurance approval.  Tremfya 100 mg/mL sample given to patient today, Lot [id number] Exp 08/2023. NDC [phone]. Patient will self-inject Tremfya today at home.   Tremfya 100 mg/mL inject at weeks 0 and 4 for initial dose Tremfya 100 mg/mL inject every 8 weeks 5Rf. Rxs sent to [facility] Prescription Enrollment signed and faxed.   Counseling and coordination of care for severe psoriasis on systemic treatment   psoriasis - severe on systemic treatment.  Psoriasis is a chronic non-curable, but treatable genetic/hereditary disease that may have other systemic features affecting other organ systems such as joints (Psoriatic Arthritis).  It is linked with heart disease, inflammatory bowel disease, non-alcoholic fatty liver disease, and depression. Significant skin psoriasis and/or psoriatic arthritis may have significant symptoms and affects activities of daily activity and often benefits from systemic treatments.  These systemic treatments have some potential side effects including immunosuppression and require pre-treatment laboratory screening and periodic laboratory monitoring and periodic in person evaluation and monitoring by the attending dermatologist physician (long term medication management).    Reviewed risks of biologics including immunosuppression, infections, injection site reaction, and failure to improve condition. Goal is control of skin condition, not cure.  Some older biologics such as Humira and Enbrel may slightly increase risk of malignancy and may worsen congestive heart failure.  Taltz and Cosentyx may cause inflammatory bowel disease to flare. The use of biologics requires long term medication management, including periodic office visits and monitoring of blood work.     Return in about 4 weeks (around 05/20/2023) for Psoriasis. 2nd Tremfya injection  I, Cherlyn Labella, CMA, am acting as scribe for Willeen Niece, MD .   Documentation: I have reviewed the above documentation for accuracy and completeness, and I agree with the above.  Willeen Niece, MD

## 2023-04-22 NOTE — Patient Instructions (Signed)
Reviewed risks of biologics including immunosuppression, infections, injection site reaction, and failure to improve condition. Goal is control of skin condition, not cure.  Some older biologics such as Humira and Enbrel may slightly increase risk of malignancy and may worsen congestive heart failure.  Taltz and Cosentyx may cause inflammatory bowel disease to flare. The use of biologics requires long term medication management, including periodic office visits and monitoring of blood work.     Due to recent changes in healthcare laws, you may see results of your pathology and/or laboratory studies on MyChart before the doctors have had a chance to review them. We understand that in some cases there may be results that are confusing or concerning to you. Please understand that not all results are received at the same time and often the doctors may need to interpret multiple results in order to provide you with the best plan of care or course of treatment. Therefore, we ask that you please give us 2 business days to thoroughly review all your results before contacting the office for clarification. Should we see a critical lab result, you will be contacted sooner.   If You Need Anything After Your Visit  If you have any questions or concerns for your doctor, please call our main line at [phone] and press option 4 to reach your doctor's medical assistant. If no one answers, please leave a voicemail as directed and we will return your call as soon as possible. Messages left after 4 pm will be answered the following business day.   You may also send us a message via MyChart. We typically respond to MyChart messages within 1-2 business days.  For prescription refills, please ask your pharmacy to contact our office. Our fax number is [phone].  If you have an urgent issue when the clinic is closed that cannot wait until the next business day, you can page your doctor at the number below.    Please  note that while we do our best to be available for urgent issues outside of office hours, we are not available 24/7.   If you have an urgent issue and are unable to reach us, you may choose to seek medical care at your doctor's office, retail clinic, urgent care center, or emergency room.  If you have a medical emergency, please immediately call 911 or go to the emergency department.  Pager Numbers  - Dr. Kowalski: [phone]  - Dr. Moye: [phone]  - Dr. Stewart: [phone]  In the event of inclement weather, please call our main line at [phone] for an update on the status of any delays or closures.  Dermatology Medication Tips: Please keep the boxes that topical medications come in in order to help keep track of the instructions about where and how to use these. Pharmacies typically print the medication instructions only on the boxes and not directly on the medication tubes.   If your medication is too expensive, please contact our office at [phone] option 4 or send us a message through MyChart.   We are unable to tell what your co-pay for medications will be in advance as this is different depending on your insurance coverage. However, we may be able to find a substitute medication at lower cost or fill out paperwork to get insurance to cover a needed medication.   If a prior authorization is required to get your medication covered by your insurance company, please allow us 1-2 business days to complete this process.  Drug prices   often vary depending on where the prescription is filled and some pharmacies may offer cheaper prices.  The website www.goodrx.com contains coupons for medications through different pharmacies. The prices here do not account for what the cost may be with help from insurance (it may be cheaper with your insurance), but the website can give you the price if you did not use any insurance.  - You can print the associated coupon and take it with  your prescription to the pharmacy.  - You may also stop by our office during regular business hours and pick up a GoodRx coupon card.  - If you need your prescription sent electronically to a different pharmacy, notify our office through [place] MyChart or by phone at [phone] option 4.     Si Usted Necesita Algo Despus de Su Visita  Tambin puede enviarnos un mensaje a travs de MyChart. Por lo general respondemos a los mensajes de MyChart en el transcurso de 1 a 2 das hbiles.  Para renovar recetas, por favor pida a su farmacia que se ponga en contacto con nuestra oficina. Nuestro nmero de fax es el [phone].  Si tiene un asunto urgente cuando la clnica est cerrada y que no puede esperar hasta el siguiente da hbil, puede llamar/localizar a su doctor(a) al nmero que aparece a continuacin.   Por favor, tenga en cuenta que aunque hacemos todo lo posible para estar disponibles para asuntos urgentes fuera del horario de oficina, no estamos disponibles las 24 horas del da, los 7 das de la semana.   Si tiene un problema urgente y no puede comunicarse con nosotros, puede optar por buscar atencin mdica  en el consultorio de su doctor(a), en una clnica privada, en un centro de atencin urgente o en una sala de emergencias.  Si tiene una emergencia mdica, por favor llame inmediatamente al 911 o vaya a la sala de emergencias.  Nmeros de bper  - Dr. Kowalski: [phone]  - Dra. Moye: [phone]  - Dra. Stewart: [phone]  En caso de inclemencias del tiempo, por favor llame a nuestra lnea principal al [phone] para una actualizacin sobre el estado de cualquier retraso o cierre.  Consejos para la medicacin en dermatologa: Por favor, guarde las cajas en las que vienen los medicamentos de uso tpico para ayudarle a seguir las instrucciones sobre dnde y cmo usarlos. Las farmacias generalmente imprimen las instrucciones del medicamento slo en las cajas y  no directamente en los tubos del medicamento.   Si su medicamento es muy caro, por favor, pngase en contacto con nuestra oficina llamando al [phone] y presione la opcin 4 o envenos un mensaje a travs de MyChart.   No podemos decirle cul ser su copago por los medicamentos por adelantado ya que esto es diferente dependiendo de la cobertura de su seguro. Sin embargo, es posible que podamos encontrar un medicamento sustituto a menor costo o llenar un formulario para que el seguro cubra el medicamento que se considera necesario.   Si se requiere una autorizacin previa para que su compaa de seguros cubra su medicamento, por favor permtanos de 1 a 2 das hbiles para completar este proceso.  Los precios de los medicamentos varan con frecuencia dependiendo del lugar de dnde se surte la receta y alguna farmacias pueden ofrecer precios ms baratos.  El sitio web www.goodrx.com tiene cupones para medicamentos de diferentes farmacias. Los precios aqu no tienen en cuenta lo que podra costar con la ayuda del seguro (puede ser ms barato con   su seguro), pero el sitio web puede darle el precio si no utiliz ningn seguro.  - Puede imprimir el cupn correspondiente y llevarlo con su receta a la farmacia.  - Tambin puede pasar por nuestra oficina durante el horario de atencin regular y recoger una tarjeta de cupones de GoodRx.  - Si necesita que su receta se enve electrnicamente a una farmacia diferente, informe a nuestra oficina a travs de MyChart de [place] o por telfono llamando al [phone] y presione la opcin 4.  

## 2023-04-27 ENCOUNTER — Other Ambulatory Visit: Payer: Self-pay

## 2023-04-27 ENCOUNTER — Other Ambulatory Visit (HOSPITAL_COMMUNITY): Payer: Self-pay

## 2023-04-27 MED ORDER — TREMFYA 100 MG/ML ~~LOC~~ SOSY: PREFILLED_SYRINGE | SUBCUTANEOUS | 0 refills | Status: DC

## 2023-04-29 DIAGNOSIS — G4733 Obstructive sleep apnea (adult) (pediatric): Secondary | ICD-10-CM | POA: Diagnosis not present

## 2023-05-04 ENCOUNTER — Other Ambulatory Visit: Payer: Self-pay

## 2023-05-04 ENCOUNTER — Other Ambulatory Visit (HOSPITAL_COMMUNITY): Payer: Self-pay

## 2023-05-04 ENCOUNTER — Ambulatory Visit: Payer: 59 | Attending: Family Medicine | Admitting: Pharmacist

## 2023-05-04 DIAGNOSIS — Z79899 Other long term (current) drug therapy: Secondary | ICD-10-CM

## 2023-05-04 MED ORDER — TREMFYA 100 MG/ML ~~LOC~~ SOAJ
100.0000 mg | SUBCUTANEOUS | 0 refills | Status: DC
  Filled 2023-05-04: qty 2, fill #0
  Filled 2023-05-07: qty 1, 28d supply, fill #0

## 2023-05-04 MED ORDER — TREMFYA 100 MG/ML ~~LOC~~ SOAJ
100.0000 mg | SUBCUTANEOUS | 5 refills | Status: DC
  Filled 2023-05-04: qty 1, fill #0
  Filled 2023-06-02: qty 1, 56d supply, fill #0

## 2023-05-04 NOTE — Progress Notes (Signed)
   S: Patient presents today for review of their specialty medication.   Patient is currently taking Tremfya for plaque psoriasis. Patient is managed by Dr. Roseanne Reno for this.   Dosing: Plaque psoriasis: SubQ: 100 mg at weeks 0, 4, and then every 8 weeks thereafter.  Adherence: confirmed. Has taken 1 injection in clinic. Tolerated this well.   Efficacy: can already note a better control in symptoms.   Monitoring:  S/sx of infection: none S/sx of hypersensitivity: none  Current adverse effects: none   O:     Lab Results  Component Value Date   WBC 7.4 09/30/2022   HGB 15.4 09/30/2022   HCT 43.4 09/30/2022   MCV 90.7 09/30/2022   PLT 168.0 09/30/2022      Chemistry      Component Value Date/Time   NA 139 09/30/2022 0911   NA 143 08/27/2022 1145   K 4.0 09/30/2022 0911   CL 103 09/30/2022 0911   CO2 26 09/30/2022 0911   BUN 17 09/30/2022 0911   BUN 17 08/27/2022 1145   CREATININE 1.02 09/30/2022 0911   CREATININE 1.26 05/24/2021 1600   GLU 100 10/02/2017 0000      Component Value Date/Time   CALCIUM 9.9 09/30/2022 0911   ALKPHOS 96 09/30/2022 0911   AST 73 (H) 09/30/2022 0911   ALT 126 (H) 09/30/2022 0911   BILITOT 0.9 09/30/2022 0911   BILITOT 0.7 08/27/2022 1145       A/P: 1. Medication review: patient currently on Tremfya for plaque psoriasis. Reviewed the medication with the patient, including the following: Tremfya is a medication used in the treatment of plaque psoriasis. Administer SubQ into front of thighs, lower abdomen (except for 2 inches around navel), or back of upper arms; do not inject into areas where the skin is tender, bruised, red, hard, thick, scaly, or affected by psoriasis. Possible adverse reactions include increased risk of infection, headache, and hypersensitivity reactions. Live vaccinations should be avoided. No recommendations for any changes.  Butch Penny, PharmD, Patsy [place], CPP Clinical Pharmacist [facility] [phone]

## 2023-05-07 ENCOUNTER — Other Ambulatory Visit: Payer: Self-pay

## 2023-05-07 ENCOUNTER — Other Ambulatory Visit (HOSPITAL_COMMUNITY): Payer: Self-pay

## 2023-05-08 ENCOUNTER — Other Ambulatory Visit (HOSPITAL_COMMUNITY): Payer: Self-pay

## 2023-05-08 ENCOUNTER — Other Ambulatory Visit: Payer: Self-pay

## 2023-05-11 ENCOUNTER — Other Ambulatory Visit (HOSPITAL_COMMUNITY): Payer: Self-pay

## 2023-05-13 ENCOUNTER — Ambulatory Visit: Payer: 59 | Admitting: Pulmonary Disease

## 2023-05-19 ENCOUNTER — Ambulatory Visit
Admission: EM | Admit: 2023-05-19 | Discharge: 2023-05-19 | Disposition: A | Discharge status: Home / Self Care | Payer: 59 | Attending: Emergency Medicine | Admitting: Emergency Medicine

## 2023-05-19 DIAGNOSIS — W57XXXA Bitten or stung by nonvenomous insect and other nonvenomous arthropods, initial encounter: Secondary | ICD-10-CM

## 2023-05-19 DIAGNOSIS — M25562 Pain in left knee: Secondary | ICD-10-CM | POA: Diagnosis not present

## 2023-05-19 DIAGNOSIS — S20462A Insect bite (nonvenomous) of left back wall of thorax, initial encounter: Secondary | ICD-10-CM | POA: Diagnosis not present

## 2023-05-19 DIAGNOSIS — M25561 Pain in right knee: Secondary | ICD-10-CM

## 2023-05-19 MED ORDER — DOXYCYCLINE HYCLATE 100 MG PO CAPS: 100.0000 mg | ORAL_CAPSULE | Freq: Two times a day (BID) | ORAL | 0 refills | Status: DC

## 2023-05-19 NOTE — ED Triage Notes (Signed)
Pt is with his wife.  Pt c/o tick bite to left mid back. Pt has a raised spot where the tick bite was.  Pt states he is now having headache, body aches, fatigue.  Pt has worked overnight through the weekend and fell going up the stairs.   Pt is unsure how long tick was in his back.

## 2023-05-19 NOTE — Discharge Instructions (Signed)
Take the doxycycline twice daily with food for 10 days.  The doxycycline will make you more photosensitive Subedi to make sure that you are wearing sunscreen when you are outdoors and make sure that you are applying it to your ears and also the part of your hair.  Reapply the sunscreen every 2 hours while outdoors.  If you develop any rashes, joint pain, fever, muscle pain, or fatigue return for reevaluation.  

## 2023-05-19 NOTE — ED Provider Notes (Signed)
[facility]    CSN: [id number] Arrival date & time: 05/19/23  0800      History   Chief Complaint Chief Complaint  Patient presents with   Insect Bite   Fall    HPI Daniel Calderon is a 57 y.o. male.   HPI  57 year old male with a past medical history of psoriasis and psoriatic arthritis presents for evaluation of headache, joint pain, and itching that has been going on for the last 2 to 3 days.  He describes his joints as being more stiff than painful.  He reports that due to the joint stiffness he tripped going up some stairs over the weekend.  He also endorses fatigue.  He denies fever or rash.  His wife did pull a small tick off of his left back and then had to take the hat out and she is concerned about tickborne illness or infection.  Past Medical History:  Diagnosis Date   Allergy    Seasonal   Anxiety    Benign enlargement of prostate    Complex sleep apnea syndrome    Depression    Elevated BP    Elevated transaminase level    Failure of erection    Fatigue    Frequent headaches    GERD (gastroesophageal reflux disease)    History of kidney stones    Hypertension    Hypogonadism in male    Migraine    NAFL (nonalcoholic fatty liver)    Sinus congestion 09/01/2017   Sleep apnea    Currently uses cpap   Splenomegaly    Thoracic outlet syndrome    Left Arm   Tongue pain 02/09/2019    Patient Active Problem List   Diagnosis Date Noted   History of colonic polyps    Family history of colon cancer    Encounter for general adult medical examination with abnormal findings 10/06/2022   Splenomegaly 10/03/2022   Left groin pain 09/30/2022   Asthma 07/21/2022   Nausea 04/08/2022   Tremor 04/08/2022   ASIS pain 04/08/2022   Left sided numbness 02/13/2020   Hyperlipidemia 12/07/2019   Radiculopathy, cervical and lumbar 12/06/2019   Abdominal pain, epigastric    Left flank pain 08/15/2019   Elevated LFTs 08/10/2019   Hypogammaglobulinemia  (HCC) 05/17/2019   Excessive daytime sleepiness 03/28/2019   Cataplexy 03/28/2019   OSA on CPAP 03/28/2019   Acute pain of left knee 04/18/2018   Cutaneous skin tags 04/18/2018   Lower extremity edema 04/18/2018   Thoracic outlet syndrome 02/19/2018   Pain in limb 02/19/2018   Muscle strain 09/01/2017   Chest pain 03/21/2017   Dyspnea on exertion 02/19/2017   Language difficulty 02/19/2017   BPH with obstruction/lower urinary tract symptoms 12/03/2015   Medication refill 09/13/2015   Erectile disorder, generalized, mild 07/26/2015   Hypogonadism in male 05/24/2015   Environmental allergies 04/13/2015   Sleep apnea 04/13/2015   Migraines 04/13/2015   Neck pain 04/13/2015   Adjustment disorder with mixed anxiety and depressed mood 04/13/2015   Psoriasis 04/13/2015   History of kidney stones 04/13/2015   Neuropathy 04/13/2015   Benign fibroma of prostate 03/24/2015   Hypertension 03/24/2015   Elevation of level of transaminase or lactic acid dehydrogenase (LDH) 03/24/2015   Fatigue 03/24/2015    Past Surgical History:  Procedure Laterality Date   COLONOSCOPY WITH PROPOFOL N/A 11/11/2017   Procedure: COLONOSCOPY WITH PROPOFOL;  Surgeon: Toney Reil, MD;  Location: [facility];  Service: Endoscopy;  Laterality: N/A;   COLONOSCOPY WITH PROPOFOL N/A 10/14/2022   Procedure: COLONOSCOPY WITH PROPOFOL;  Surgeon: Toney Reil, MD;  Location: [facility];  Service: Endoscopy;  Laterality: N/A;  sleep apnea   ESOPHAGOGASTRODUODENOSCOPY (EGD) WITH PROPOFOL N/A 08/25/2019   Procedure: ESOPHAGOGASTRODUODENOSCOPY (EGD) WITH PROPOFOL;  Surgeon: Toney Reil, MD;  Location: [facility];  Service: Gastroenterology;  Laterality: N/A;   KNEE ARTHROSCOPY WITH MEDIAL MENISECTOMY Left 07/26/2018   Procedure: KNEE ARTHROSCOPY WITH PARTIAL MEDIAL MENISECTOMY;  Surgeon: Signa Kell, MD;  Location: [facility] OR[facility];  Service: Orthopedics;  Laterality: Left;   Left Shoulder  Surgery  2011   nerve block     2 in neck. 5 in back.   NOSE SURGERY     POLYPECTOMY  11/11/2017   Procedure: POLYPECTOMY;  Surgeon: Toney Reil, MD;  Location: [facility];  Service: Endoscopy;;   SCALENE NODE BIOPSY / EXCISION     VASECTOMY         Home Medications    Prior to Admission medications   Medication Sig Start Date End Date Taking? Authorizing Provider  amLODipine (NORVASC) 10 MG tablet Take 1 tablet (10 mg total) by mouth daily. 11/14/22 11/14/23 Yes Glori Luis, MD  doxycycline (VIBRAMYCIN) 100 MG capsule Take 1 capsule (100 mg total) by mouth 2 (two) times daily. 05/19/23  Yes Becky Augusta, NP  fluticasone-salmeterol (ADVAIR DISKUS) 100-50 MCG/ACT AEPB Inhale 1 puff into the lungs 2 (two) times daily. 07/04/22  Yes Salena Saner, MD  Guselkumab (TREMFYA) 100 MG/ML SOPN Inject 100 mg into the skin every 8 (eight) weeks. For maintenance. 05/04/23  Yes Jegede, Olugbemiga E, MD  Guselkumab (TREMFYA) 100 MG/ML SOPN Inject 100 mg into the skin as directed. At week 0 & 4. 05/04/23  Yes Quentin Angst, MD  omeprazole (PRILOSEC) 20 MG capsule Take one capsule by mouth daily. 01/12/23 01/12/24 Yes Glori Luis, MD  sertraline (ZOLOFT) 100 MG tablet Take 1.5 tablets (150 mg total) by mouth daily. 12/01/22 12/01/23 Yes Glori Luis, MD    Family History Family History  Problem Relation Age of Onset   Hypertension Mother        Living   Hearing loss Mother    Heart disease Other    Healthy Father        Living   Diabetes Brother    Stroke Paternal Uncle    Heart attack Paternal Uncle    Hearing loss Maternal Grandmother    Healthy Son    Healthy Daughter    Kidney cancer Neg Hx    Bladder Cancer Neg Hx    Prostate cancer Neg Hx     Social History Social History   Tobacco Use   Smoking status: Never   Smokeless tobacco: Never  Vaping Use   Vaping Use: Never used  Substance Use Topics   Alcohol use: Yes    Alcohol/week:  14.0 standard drinks of alcohol    Types: 14 Shots of liquor per week    Comment: 10/07/22  None in last 7 days   Drug use: No     Allergies   Erythromycin, Cephalexin, Dexamethasone, Oxycodone-acetaminophen, Prednisone, and Gabapentin   Review of Systems Review of Systems  Constitutional:  Positive for fatigue. Negative for fever.  Musculoskeletal:  Positive for arthralgias.  Skin:  Negative for rash.  Neurological:  Positive for headaches.     Physical Exam Triage Vital Signs ED Triage Vitals  Enc Vitals Group  BP      Pulse      Resp      Temp      Temp src      SpO2      Weight      Height      Head Circumference      Peak Flow      Pain Score      Pain Loc      Pain Edu?      Excl. in GC?    No data found.  Updated Vital Signs BP 121/79 (BP Location: Left Arm)   Pulse 77   Temp 98.2 F (36.8 C) (Oral)   Ht 6\' 2"  (1.88 m)   Wt 215 lb (97.5 kg)   SpO2 93%   BMI 27.60 kg/m   Visual Acuity Right Eye Distance:   Left Eye Distance:   Bilateral Distance:    Right Eye Near:   Left Eye Near:    Bilateral Near:     Physical Exam Vitals and nursing note reviewed.  Constitutional:      Appearance: Normal appearance.  Skin:    General: Skin is warm and dry.     Capillary Refill: Capillary refill takes less than 2 seconds.     Findings: Erythema and lesion present.  Neurological:     General: No focal deficit present.     Mental Status: He is alert and oriented to person, place, and time.      UC Treatments / Results  Labs (all labs ordered are listed, but only abnormal results are displayed) Labs Reviewed - No data to display  EKG   Radiology No results found.  Procedures Procedures (including critical care time)  Medications Ordered in UC Medications - No data to display  Initial Impression / Assessment and Plan / UC Course  I have reviewed the triage vital signs and the nursing notes.  Pertinent labs & imaging results that  were available during my care of the patient were reviewed by me and considered in my medical decision making (see chart for details).   Patient is a pleasant, nontoxic-appearing 57 year old male who is here for evaluation after his wife pulled a partially engorged tick off of his back yesterday.  For the last 2 to 3 days he has been experiencing headache, fatigue, and joint stiffness above his baseline for his psoriatic arthritis.  As you can see in image above, there is a scabbed bite mark with some surrounding erythema.  This is not hot to touch or tender.  When viewed under magnification there are no remaining jaw parts of the tick.  Given patient's cluster of symptoms I am concerned that he is developing a tickborne illness and I will cover him with doxycycline 100 mg twice daily for 10 days.  I have cautioned him regarding photosensitivity effects of doxycycline.  I have also given him ER precautions that should his joint pain worsen, he develop fevers, worsening headache, or systemic rashes.  Final Clinical Impressions(s) / UC Diagnoses   Final diagnoses:  Arthralgia of both lower legs  Tick bite of left back wall of thorax, initial encounter     Discharge Instructions      Take the doxycycline twice daily with food for 10 days.  The doxycycline will make you more photosensitive Subedi to make sure that you are wearing sunscreen when you are outdoors and make sure that you are applying it to your ears and also the part of your  hair.  Reapply the sunscreen every 2 hours while outdoors.  If you develop any rashes, joint pain, fever, muscle pain, or fatigue return for reevaluation.      ED Prescriptions     Medication Sig Dispense Auth. Provider   doxycycline (VIBRAMYCIN) 100 MG capsule Take 1 capsule (100 mg total) by mouth 2 (two) times daily. 20 capsule Becky Augusta, NP      PDMP not reviewed this encounter.   Becky Augusta, NP 05/19/23 [phone]

## 2023-05-20 ENCOUNTER — Ambulatory Visit: Payer: 59 | Admitting: Dermatology

## 2023-05-27 ENCOUNTER — Other Ambulatory Visit: Payer: Self-pay

## 2023-05-27 MED FILL — Amlodipine Besylate Tab 10 MG (Base Equivalent): ORAL | 90 days supply | Qty: 90 | Fill #2 | Status: AC

## 2023-05-30 DIAGNOSIS — G4733 Obstructive sleep apnea (adult) (pediatric): Secondary | ICD-10-CM | POA: Diagnosis not present

## 2023-06-02 ENCOUNTER — Other Ambulatory Visit (HOSPITAL_COMMUNITY): Payer: Self-pay

## 2023-06-03 ENCOUNTER — Other Ambulatory Visit (HOSPITAL_COMMUNITY): Payer: Self-pay

## 2023-06-03 ENCOUNTER — Other Ambulatory Visit: Payer: Self-pay

## 2023-06-17 ENCOUNTER — Ambulatory Visit: Payer: 59 | Admitting: Dermatology

## 2023-06-17 VITALS — BP 137/89 | HR 73

## 2023-06-17 DIAGNOSIS — L4050 Arthropathic psoriasis, unspecified: Secondary | ICD-10-CM

## 2023-06-17 DIAGNOSIS — L409 Psoriasis, unspecified: Secondary | ICD-10-CM | POA: Diagnosis not present

## 2023-06-17 DIAGNOSIS — Z79899 Other long term (current) drug therapy: Secondary | ICD-10-CM | POA: Diagnosis not present

## 2023-06-17 MED ORDER — VTAMA 1 % EX CREA: TOPICAL_CREAM | CUTANEOUS | 3 refills | Status: DC

## 2023-06-17 NOTE — Patient Instructions (Addendum)
Your prescription was sent to [facility] in [place]. A representative from [facility] will contact you within 3 business hours to verify your address and insurance information to schedule a free delivery. If for any reason you do not receive a phone call from them, please reach out to them. Their phone number is [phone] and their hours are Monday-Friday 9:00 am-5:00 pm.      Due to recent changes in healthcare laws, you may see results of your pathology and/or laboratory studies on MyChart before the doctors have had a chance to review them. We understand that in some cases there may be results that are confusing or concerning to you. Please understand that not all results are received at the same time and often the doctors may need to interpret multiple results in order to provide you with the best plan of care or course of treatment. Therefore, we ask that you please give us 2 business days to thoroughly review all your results before contacting the office for clarification. Should we see a critical lab result, you will be contacted sooner.   If You Need Anything After Your Visit  If you have any questions or concerns for your doctor, please call our main line at [phone] and press option 4 to reach your doctor's medical assistant. If no one answers, please leave a voicemail as directed and we will return your call as soon as possible. Messages left after 4 pm will be answered the following business day.   You may also send us a message via MyChart. We typically respond to MyChart messages within 1-2 business days.  For prescription refills, please ask your pharmacy to contact our office. Our fax number is [phone].  If you have an urgent issue when the clinic is closed that cannot wait until the next business day, you can page your doctor at the number below.    Please note that while we do our best to be available for urgent issues outside of office hours, we are not  available 24/7.   If you have an urgent issue and are unable to reach us, you may choose to seek medical care at your doctor's office, retail clinic, urgent care center, or emergency room.  If you have a medical emergency, please immediately call 911 or go to the emergency department.  Pager Numbers  - Dr. Kowalski: [phone]  - Dr. Moye: [phone]  - Dr. Stewart: [phone]  In the event of inclement weather, please call our main line at [phone] for an update on the status of any delays or closures.  Dermatology Medication Tips: Please keep the boxes that topical medications come in in order to help keep track of the instructions about where and how to use these. Pharmacies typically print the medication instructions only on the boxes and not directly on the medication tubes.   If your medication is too expensive, please contact our office at [phone] option 4 or send us a message through MyChart.   We are unable to tell what your co-pay for medications will be in advance as this is different depending on your insurance coverage. However, we may be able to find a substitute medication at lower cost or fill out paperwork to get insurance to cover a needed medication.   If a prior authorization is required to get your medication covered by your insurance company, please allow us 1-2 business days to complete this process.  Drug prices often vary depending on where the prescription is   filled and some pharmacies may offer cheaper prices.  The website www.goodrx.com contains coupons for medications through different pharmacies. The prices here do not account for what the cost may be with help from insurance (it may be cheaper with your insurance), but the website can give you the price if you did not use any insurance.  - You can print the associated coupon and take it with your prescription to the pharmacy.  - You may also stop by our office during regular business hours  and pick up a GoodRx coupon card.  - If you need your prescription sent electronically to a different pharmacy, notify our office through [place] MyChart or by phone at [phone] option 4.     Si Usted Necesita Algo Despus de Su Visita  Tambin puede enviarnos un mensaje a travs de MyChart. Por lo general respondemos a los mensajes de MyChart en el transcurso de 1 a 2 das hbiles.  Para renovar recetas, por favor pida a su farmacia que se ponga en contacto con nuestra oficina. Nuestro nmero de fax es el [phone].  Si tiene un asunto urgente cuando la clnica est cerrada y que no puede esperar hasta el siguiente da hbil, puede llamar/localizar a su doctor(a) al nmero que aparece a continuacin.   Por favor, tenga en cuenta que aunque hacemos todo lo posible para estar disponibles para asuntos urgentes fuera del horario de oficina, no estamos disponibles las 24 horas del da, los 7 das de la semana.   Si tiene un problema urgente y no puede comunicarse con nosotros, puede optar por buscar atencin mdica  en el consultorio de su doctor(a), en una clnica privada, en un centro de atencin urgente o en una sala de emergencias.  Si tiene una emergencia mdica, por favor llame inmediatamente al 911 o vaya a la sala de emergencias.  Nmeros de bper  - Dr. Kowalski: [phone]  - Dra. Moye: [phone]  - Dra. Stewart: [phone]  En caso de inclemencias del tiempo, por favor llame a nuestra lnea principal al [phone] para una actualizacin sobre el estado de cualquier retraso o cierre.  Consejos para la medicacin en dermatologa: Por favor, guarde las cajas en las que vienen los medicamentos de uso tpico para ayudarle a seguir las instrucciones sobre dnde y cmo usarlos. Las farmacias generalmente imprimen las instrucciones del medicamento slo en las cajas y no directamente en los tubos del medicamento.   Si su medicamento es muy caro, por favor, pngase  en contacto con nuestra oficina llamando al [phone] y presione la opcin 4 o envenos un mensaje a travs de MyChart.   No podemos decirle cul ser su copago por los medicamentos por adelantado ya que esto es diferente dependiendo de la cobertura de su seguro. Sin embargo, es posible que podamos encontrar un medicamento sustituto a menor costo o llenar un formulario para que el seguro cubra el medicamento que se considera necesario.   Si se requiere una autorizacin previa para que su compaa de seguros cubra su medicamento, por favor permtanos de 1 a 2 das hbiles para completar este proceso.  Los precios de los medicamentos varan con frecuencia dependiendo del lugar de dnde se surte la receta y alguna farmacias pueden ofrecer precios ms baratos.  El sitio web www.goodrx.com tiene cupones para medicamentos de diferentes farmacias. Los precios aqu no tienen en cuenta lo que podra costar con la ayuda del seguro (puede ser ms barato con su seguro), pero el sitio web puede darle   el precio si no utiliz ningn seguro.  - Puede imprimir el cupn correspondiente y llevarlo con su receta a la farmacia.  - Tambin puede pasar por nuestra oficina durante el horario de atencin regular y recoger una tarjeta de cupones de GoodRx.  - Si necesita que su receta se enve electrnicamente a una farmacia diferente, informe a nuestra oficina a travs de MyChart de [place] o por telfono llamando al [phone] y presione la opcin 4.  

## 2023-06-17 NOTE — Progress Notes (Signed)
   Follow-Up Visit   Subjective  Daniel Calderon is a 57 y.o. male who presents for the following: Psoriasis with PsA, 1 month f/u, started Tremfya last month. He has had initial injections at week 0 and week 4. Psoriasis is not improving and has had bleeding at sites on the legs.  He has tried [place] (GI side effects), Cristy Folks (stopped working), Bimzelx (mood changes).  He has not used any topicals.  The following portions of the chart were reviewed this encounter and updated as appropriate: medications, allergies, medical history  Review of Systems:  No other skin or systemic complaints except as noted in HPI or Assessment and Plan.  Objective  Well appearing patient in no apparent distress; mood and affect are within normal limits.  Areas Examined: Legs, arms  Relevant exam findings are noted in the Assessment and Plan.      Assessment & Plan     PSORIASIS WITH PsA  Pink scaly plaques on the right knee; pink scaly papules on the legs, lower back.  6% BSA.  Chronic and persistent condition with duration or expected duration over one year. Condition is bothersome/symptomatic for patient. Currently flared.   Patient with joint pain.  Treatment Plan: Continue Tremfya 100 mg/mL injections. Patient has injection at home and will inject today. He will inject again in 4 weeks due to persistent flare. If patient not able to get Tremfya sent to him for that 4 wk injection, he will come to office for sample.  Start Vtama Cream once a day to affected areas of psoriasis.   If patient isn't improved by next visit (skin and joints), will change biologic to Talz.   Counseling and coordination of care for severe psoriasis on systemic treatment  psoriasis - severe on systemic treatment.  Psoriasis is a chronic non-curable, but treatable genetic/hereditary disease that may have other systemic features affecting other organ systems such as joints (Psoriatic Arthritis).  It is linked with  heart disease, inflammatory bowel disease, non-alcoholic fatty liver disease, and depression. Significant skin psoriasis and/or psoriatic arthritis may have significant symptoms and affects activities of daily activity and often benefits from systemic treatments.  These systemic treatments have some potential side effects including immunosuppression and require pre-treatment laboratory screening and periodic laboratory monitoring and periodic in person evaluation and monitoring by the attending dermatologist physician (long term medication management).     Return in about 4 months (around 10/17/2023) for Psoriasis.  ICherlyn Labella, CMA, am acting as scribe for Willeen Niece, MD .   Documentation: I have reviewed the above documentation for accuracy and completeness, and I agree with the above.  Willeen Niece, MD

## 2023-06-29 DIAGNOSIS — G4733 Obstructive sleep apnea (adult) (pediatric): Secondary | ICD-10-CM | POA: Diagnosis not present

## 2023-07-02 ENCOUNTER — Ambulatory Visit: Payer: 59 | Admitting: Urology

## 2023-07-03 ENCOUNTER — Encounter: Payer: Self-pay | Admitting: Urology

## 2023-07-03 DIAGNOSIS — G4733 Obstructive sleep apnea (adult) (pediatric): Secondary | ICD-10-CM | POA: Diagnosis not present

## 2023-07-08 ENCOUNTER — Ambulatory Visit: Payer: 59 | Admitting: Urology

## 2023-07-08 ENCOUNTER — Encounter: Payer: Self-pay | Admitting: Urology

## 2023-07-08 VITALS — BP 111/73 | HR 80 | Ht 74.0 in | Wt 215.0 lb

## 2023-07-08 DIAGNOSIS — E291 Testicular hypofunction: Secondary | ICD-10-CM | POA: Diagnosis not present

## 2023-07-08 DIAGNOSIS — N5201 Erectile dysfunction due to arterial insufficiency: Secondary | ICD-10-CM

## 2023-07-08 MED ORDER — AMBULATORY NON FORMULARY MEDICATION: 0 refills | Status: DC

## 2023-07-08 NOTE — Patient Instructions (Signed)
EDEX

## 2023-07-08 NOTE — Progress Notes (Signed)
I, Duke Salvia, acting as a Neurosurgeon for Riki Altes, MD., have documented all relevant documentation on the behalf of Riki Altes, MD, as directed by  Riki Altes, MD while in the presence of Riki Altes, MD.   07/08/2023 1:32 PM   Daniel Calderon 1966-04-12 [id number]  Referring provider: Glori Luis, MD [address] [place],  [place] [address] Chief Complaint  Patient presents with   Hypogonadism    HPI: Daniel Calderon is a 57 y.o. male presenting for follow up of hypogonadism.  Off testosterone since 2022. He was diagnosed with sleep apnea and is on BiPAP and has noted significant improvement in his sleep/energy level and denies fatigue, decreased libido. His main complaint is erectile dysfunction, which was also present when he was on testosterone replacement therapy.  Difficulty achieving and maintaining an erection Unable to tolerate PDE-5 inhibitors secondary to headache. No pain or curvature with erections. Last PSA October 2023 was 0.16.   PMH: Past Medical History:  Diagnosis Date   Allergy    Seasonal   Anxiety    Benign enlargement of prostate    Depression    Elevated BP    Elevated transaminase level    Failure of erection    Fatigue    Frequent headaches    GERD (gastroesophageal reflux disease)    History of kidney stones    Hypertension    Hypogonadism in male    Migraine    Sinus congestion 09/01/2017   Sleep apnea    Currently uses cpap   Thoracic outlet syndrome    Left Arm   Tongue pain 02/09/2019    Surgical History: Past Surgical History:  Procedure Laterality Date   COLONOSCOPY WITH PROPOFOL N/A 11/11/2017   Procedure: COLONOSCOPY WITH PROPOFOL;  Surgeon: Toney Reil, MD;  Location: [facility];  Service: Endoscopy;  Laterality: N/A;   ESOPHAGOGASTRODUODENOSCOPY (EGD) WITH PROPOFOL N/A 08/25/2019   Procedure: ESOPHAGOGASTRODUODENOSCOPY (EGD) WITH PROPOFOL;  Surgeon: Toney Reil, MD;  Location: [facility];  Service: Gastroenterology;  Laterality: N/A;   KNEE ARTHROSCOPY WITH MEDIAL MENISECTOMY Left 07/26/2018   Procedure: KNEE ARTHROSCOPY WITH PARTIAL MEDIAL MENISECTOMY;  Surgeon: Signa Kell, MD;  Location: [facility] OR[facility];  Service: Orthopedics;  Laterality: Left;   Left Shoulder Surgery  2011   nerve block     2 in neck. 5 in back.   NOSE SURGERY     POLYPECTOMY  11/11/2017   Procedure: POLYPECTOMY;  Surgeon: Toney Reil, MD;  Location: [facility];  Service: Endoscopy;;   SCALENE NODE BIOPSY / EXCISION     VASECTOMY      Home Medications:  Allergies as of 09/14/2020       Reactions   Erythromycin Nausea And Vomiting   Cephalexin Other (See Comments)   Unknown   Dexamethasone Other (See Comments)   Unknown   Oxycodone-acetaminophen Other (See Comments)   Unknown   Prednisone Other (See Comments)   Unknown   Gabapentin Other (See Comments)   Aggressive   Ibuprofen Itching, Other (See Comments)   Can take in low doses per pt        Medication List        Accurate as of September 14, 2020  1:32 PM. If you have any questions, ask your nurse or doctor.          amLODipine 10 MG tablet Commonly known as: NORVASC TAKE 1 TABLET BY MOUTH DAILY.  atorvastatin 40 MG tablet Commonly known as: LIPITOR Take 1 tablet (40 mg total) by mouth daily at 6 PM.   benzonatate 200 MG capsule Commonly known as: TESSALON Take 1 capsule (200 mg total) by mouth 3 (three) times daily as needed for cough.   chlorpheniramine-HYDROcodone 10-8 MG/5ML Suer Commonly known as: Tussionex Pennkinetic ER Take 5 mLs by mouth every 12 (twelve) hours as needed for cough.   clonazePAM 0.5 MG tablet Commonly known as: KLONOPIN Take 0.5 mg by mouth 2 (two) times daily as needed.   cyanocobalamin 1000 MCG/ML injection Commonly known as: (VITAMIN B-12) Inject 1,000 mcg into the muscle every 30 (thirty) days.   diclofenac 75 MG EC tablet Commonly  known as: VOLTAREN Take 75 mg by mouth 2 (two) times daily.   divalproex 250 MG DR tablet Commonly known as: DEPAKOTE Take 250 mg by mouth 2 (two) times daily.   ibuprofen 600 MG tablet Commonly known as: ADVIL Take 1 tablet (600 mg total) by mouth every 6 (six) hours as needed.   multivitamin tablet Take 1 tablet by mouth daily.   nortriptyline 10 MG capsule Commonly known as: PAMELOR Take 10 mg by mouth at bedtime.   omeprazole 20 MG capsule Commonly known as: PRILOSEC TAKE 1 CAPSULE (20 MG TOTAL) BY MOUTH DAILY.   sertraline 100 MG tablet Commonly known as: ZOLOFT TAKE 1 TABLET BY MOUTH ONCE DAILY.   testosterone 50 MG/5GM (1%) Gel Commonly known as: ANDROGEL Place 5 g onto the skin daily.        Allergies:  Allergies  Allergen Reactions   Erythromycin Nausea And Vomiting   Cephalexin Other (See Comments)    Unknown   Dexamethasone Other (See Comments)    Unknown   Oxycodone-Acetaminophen Other (See Comments)    Unknown   Prednisone Other (See Comments)    Unknown   Gabapentin Other (See Comments)    Aggressive   Ibuprofen Itching and Other (See Comments)    Can take in low doses per pt    Family History: Family History  Problem Relation Age of Onset   Hypertension Mother        Living   Hearing loss Mother    Heart disease Other    Healthy Father        Living   Diabetes Brother    Stroke Paternal Uncle    Heart attack Paternal Uncle    Hearing loss Maternal Grandmother    Healthy Son    Healthy Daughter    Kidney cancer Neg Hx    Bladder Cancer Neg Hx    Prostate cancer Neg Hx     Social History:  reports that he has never smoked. He has never used smokeless tobacco. He reports current alcohol use of about 3.0 standard drinks of alcohol per week. He reports that he does not use drugs.   Physical Exam: BP 125/79   Pulse 99   Ht 6\' 2"  (1.88 m)   Wt 210 lb (95.3 kg)   BMI 26.96 kg/m   Constitutional:  Alert and oriented, No acute  distress. HEENT: [place] AT Respiratory: Normal respiratory effort, no increased work of breathing. Psychiatric: Normal mood and affect.    Assessment & Plan:    1.  Erectile dysfunction Main complaint is erectile dysfunction and he does not desire to restart TRT. We discussed that 70% of ED is secondary to vascular abnormalities. He is unable to tolerate PDE5 inhibitors. We discussed intracavernosal injections and he was interested  in an appointment for injection training. We discussed the most common side effects are priapism and rarely, corporal scarring. RX test dose Trimix sent to [facility] and an appointment was made with Michiel Cowboy for injection training.  2.  Hypogonadism Does not desire to restart TRT Check testosterone/LH to assess for levels that would be low enough to cause medical problems including osteoporosis.  I have reviewed the above documentation for accuracy and completeness, and I agree with the above.   Riki Altes, MD   [facility] [address], [address] [place], [place] [address] [phone]

## 2023-07-09 LAB — LUTEINIZING HORMONE: LH: 7.8 m[IU]/mL (ref 1.7–8.6)

## 2023-07-09 LAB — TESTOSTERONE: Testosterone: 319 ng/dL (ref 264–916)

## 2023-07-13 ENCOUNTER — Other Ambulatory Visit: Payer: Self-pay | Admitting: Family Medicine

## 2023-07-13 ENCOUNTER — Other Ambulatory Visit: Payer: Self-pay

## 2023-07-14 ENCOUNTER — Other Ambulatory Visit: Payer: Self-pay

## 2023-07-15 ENCOUNTER — Other Ambulatory Visit: Payer: Self-pay

## 2023-07-15 MED FILL — Omeprazole Cap Delayed Release 20 MG: ORAL | 90 days supply | Qty: 90 | Fill #0 | Status: AC

## 2023-07-16 ENCOUNTER — Other Ambulatory Visit: Payer: Self-pay

## 2023-07-23 ENCOUNTER — Other Ambulatory Visit: Payer: Self-pay

## 2023-07-27 ENCOUNTER — Encounter (HOSPITAL_COMMUNITY): Payer: Self-pay

## 2023-07-27 ENCOUNTER — Other Ambulatory Visit (HOSPITAL_COMMUNITY): Payer: Self-pay

## 2023-07-30 DIAGNOSIS — G4733 Obstructive sleep apnea (adult) (pediatric): Secondary | ICD-10-CM | POA: Diagnosis not present

## 2023-08-03 ENCOUNTER — Other Ambulatory Visit (HOSPITAL_COMMUNITY): Payer: Self-pay

## 2023-08-03 ENCOUNTER — Encounter (HOSPITAL_COMMUNITY): Payer: Self-pay

## 2023-08-03 DIAGNOSIS — G4733 Obstructive sleep apnea (adult) (pediatric): Secondary | ICD-10-CM | POA: Diagnosis not present

## 2023-08-05 NOTE — Progress Notes (Unsigned)
   08/06/2023 8:28 AM  Daniel Calderon [birth year] [id number]   Referring provider: Glori Luis, MD [address] [place],  [place] [address]  Urological history: 1.  Erectile dysfunction -Contributing factors of age, BPH, anxiety, depression, hypertension, sleep apnea, hypogonadism and alcohol consumption -Testosterone level (06/2023) 319 -Intolerable PDE 5 inhibitors  2. BPH -PSA (09/2022) 0.16  Chief Complaint  Patient presents with   trimix   HPI: Daniel Calderon is a 57 y.o. male who presents today for Trimix titration.    The procedure is discussed with patient.  He is allowed to ask questions.  Questions were answered to his satisfaction.  We are not able to go forward with the titration as he never received the medication.   Physical Exam:  BP 121/81   Pulse 80   Ht 6\' 2"  (1.88 m)   Wt 215 lb (97.5 kg)   BMI 27.60 kg/m   Constitutional:  Well nourished. Alert and oriented, No acute distress. Psychiatric: Normal mood and affect.   Assessment & Plan:    1.  Erectile dysfunction -rescheduled     Return for rescheduled to 08/27.  Hulan Fray  [facility] [address] [place], [place] [address] [phone]

## 2023-08-06 ENCOUNTER — Encounter: Payer: Self-pay | Admitting: Urology

## 2023-08-06 ENCOUNTER — Ambulatory Visit: Payer: 59 | Admitting: Urology

## 2023-08-06 VITALS — BP 121/81 | HR 80 | Ht 74.0 in | Wt 215.0 lb

## 2023-08-06 DIAGNOSIS — N5201 Erectile dysfunction due to arterial insufficiency: Secondary | ICD-10-CM

## 2023-08-06 MED ORDER — AMBULATORY NON FORMULARY MEDICATION: 0 refills | Status: DC

## 2023-08-17 NOTE — Progress Notes (Unsigned)
   08/18/2023 9:07 AM  [name] 09/17/1966 [id number]   Referring provider: Glori Luis, MD [address] [place],  [place] [address]  Urological history: 1.  Erectile dysfunction -Contributing factors of age, BPH, anxiety, depression,, HTN, hypogonadism, sleep apnea and alcohol consumption.  -Testosterone level (06/2023) 319 -Intolerable PDE 5 inhibitors  Chief Complaint  Patient presents with   trimix   HPI: PIERCESON CERRO is a 57 y.o. male who presents today for Trimix titration.    The procedure is discussed with patient.  He is allowed to ask questions.  Questions were answered to his satisfaction.  We were able to proceed to the titration.  Physical Exam:  BP 127/79   Pulse 71   Constitutional:  Well nourished. Alert and oriented, No acute distress. GU: No CVA tenderness.  No bladder fullness or masses.  Patient with circumcised phallus.   Urethral meatus is patent.  No penile discharge. No penile lesions or rashes. Scrotum without lesions, cysts, rashes and/or edema.   Psychiatric: Normal mood and affect.   Procedure  Patient's left corpus cavernosum is identified.  An area near the base of the penis is cleansed with rubbing alcohol.  Careful to avoid the dorsal vein, 2 mcg of Trimix (papaverine 30 mg, phentolamine 1 mg and prostaglandin E1 10 mcg, Lot # [id number]  exp # [id number] is injected at a 90 degree angle into the left corpus cavernosum near the base of the penis.  Patient experienced a very firm erection in 15 minutes.    Phenylephrine ([id number], MAR 2025) that was diluted with normal saline (Lot # [id number], exp 01OCT 2024) to provide a final concentration of approximately 100 to 500 mcg per mL. 2 mcg  intracavernous injections of the freshly diluted phenylephrine solution was administered into the left corpora and detumescence was achieved.    Assessment & Plan:    1.  Erectile dysfunction -great results with the 2 mcg of Trimix, he  will call back after he has satisfactory intercourse to ensure this is the correct dose so that we can prescribe the correct dose for him  -Advised patient of the condition of priapism, painful erection lasting for more than four hours, and to contact the office or seek treatment in the ED immediately     Return for patient to call with dose .  Cloretta Ned   [facility] [address] [place], [place] [address] [phone]   I spent 15 minutes on the day of the encounter to include pre-visit record review, face-to-face time with the patient, and post-visit ordering of tests.

## 2023-08-18 ENCOUNTER — Ambulatory Visit (INDEPENDENT_AMBULATORY_CARE_PROVIDER_SITE_OTHER): Payer: 59 | Admitting: Urology

## 2023-08-18 ENCOUNTER — Encounter: Payer: Self-pay | Admitting: Urology

## 2023-08-18 VITALS — BP 125/76 | HR 67

## 2023-08-18 DIAGNOSIS — N4830 Priapism, unspecified: Secondary | ICD-10-CM

## 2023-08-18 DIAGNOSIS — N529 Male erectile dysfunction, unspecified: Secondary | ICD-10-CM

## 2023-08-18 MED ORDER — PHENYLEPHRINE 100 MCG/ML FOR PRIAPISM (OUTPATIENT ~~LOC~~ UROLOGY USE ONLY)
200.0000 ug | Freq: Once | INTRAMUSCULAR | Status: AC
Start: 2023-08-18 — End: ?

## 2023-08-18 NOTE — Patient Instructions (Signed)
TRIMIX SELF-INJECTION INSTRUCTIONS    DETAILED PROCEDURE  1. GETTING SET UP  A. Proper hygiene is important. Wash your hands and keep the penis clean.  B. Assemble the following:  - Bottle of Trimix  - Alcohol pad  - Syringe  C. Keep the Trimix cold by returning the bottle to the refrigerator, or by placing the bottle in a cup of ice.   2. PREPARE THE SYRINGE  A. Wipe the rubber top of the vial with an alcohol pad.  B. After removing the cap of the needle, pull the plunger back to the desired dosage, filling this volume with air. Use a new needle and syringe each time.  C. Insert the needle through the rubber top and inject the air into the vial.  D. Turn the vial with needle and syringe inserted upside down. Pull back on the syringe plunger in a slow and steady motion until the desired dosage is achieved.  E. Tap the side of the syringe (1cc tuberculin syringe with a 29 gauge needle) to allow any air bubbles to float towards the needle. Avoid having these air bubbles in the syringe when self-injecting by first injecting out the collected bubbles that may form.  F. Remove the needle from the bottle and replace the protective cap on the needle.    3. SELECT AND PREPARE THE SITE FOR INJECTION  A. The proper location for injection is at the 9-11 and 1-3 o'clock positions, between the base and mid-portion of the penis.(see diagram) Avoid the midline because of potential for injury to the urethra (6 o'clock; for urinary passage) and the penile arteries and nerves (near 12 o'clock). Avoid any visible veins or arteries on the surface.  B. Grasp and pull the head of the penis toward the side of your leg with the index finger and thumb (use the left hand, if right handed). While maintaining light tension, select a site for injection.  C. Clean the site with an alcohol pad.   4: INJECT TRIMIX AND APPLY COMPRESSION  A. With a steady and continuous motion, penetrate the skin with the needle at a 90 o  angle. The needle should then be advanced to the hub. Slight resistance is encountered as the needle passes into the proper position within the erectile tissue (corporeal body).  B. Inject the Trimix over approximately 4 seconds. Withdraw the needle from the penis and apply compression to the injection site for approximately 1 minute. Several minutes of compression may be required to avoid bleeding, especially if you are an aspirin user.  C. Replace the cap on the needle and dispose of properly.   If you experience a painful erection that will not go down, take four (30 mg) tablets of pseudoephedrine (Sudafed-not the extended release) and if the erection does not go down in the next hour or increases in pain, contact the office immediately or seek treatment in the ED    

## 2023-08-28 ENCOUNTER — Other Ambulatory Visit: Payer: Self-pay

## 2023-08-28 MED FILL — Amlodipine Besylate Tab 10 MG (Base Equivalent): ORAL | 90 days supply | Qty: 90 | Fill #3 | Status: AC

## 2023-08-30 DIAGNOSIS — G4733 Obstructive sleep apnea (adult) (pediatric): Secondary | ICD-10-CM | POA: Diagnosis not present

## 2023-09-03 DIAGNOSIS — G4733 Obstructive sleep apnea (adult) (pediatric): Secondary | ICD-10-CM | POA: Diagnosis not present

## 2023-09-10 ENCOUNTER — Other Ambulatory Visit: Payer: Self-pay

## 2023-09-12 ENCOUNTER — Encounter (HOSPITAL_COMMUNITY): Payer: Self-pay

## 2023-09-29 DIAGNOSIS — G4733 Obstructive sleep apnea (adult) (pediatric): Secondary | ICD-10-CM | POA: Diagnosis not present

## 2023-10-07 ENCOUNTER — Other Ambulatory Visit: Payer: Self-pay

## 2023-10-08 DIAGNOSIS — G4733 Obstructive sleep apnea (adult) (pediatric): Secondary | ICD-10-CM | POA: Diagnosis not present

## 2023-10-15 ENCOUNTER — Other Ambulatory Visit: Payer: Self-pay

## 2023-10-19 DIAGNOSIS — M25551 Pain in right hip: Secondary | ICD-10-CM | POA: Diagnosis not present

## 2023-10-19 DIAGNOSIS — M25561 Pain in right knee: Secondary | ICD-10-CM | POA: Diagnosis not present

## 2023-10-20 ENCOUNTER — Other Ambulatory Visit: Payer: Self-pay | Admitting: Orthopedic Surgery

## 2023-10-20 DIAGNOSIS — M25551 Pain in right hip: Secondary | ICD-10-CM

## 2023-10-22 ENCOUNTER — Other Ambulatory Visit: Payer: Self-pay

## 2023-10-22 NOTE — Progress Notes (Signed)
Disenrolled from Transport planner.

## 2023-10-28 ENCOUNTER — Other Ambulatory Visit: Payer: Self-pay

## 2023-10-28 ENCOUNTER — Telehealth: Payer: Self-pay

## 2023-10-28 MED ORDER — OMEPRAZOLE 20 MG PO CPDR
20.0000 mg | DELAYED_RELEASE_CAPSULE | Freq: Every day | ORAL | 0 refills | Status: DC
  Filled 2023-10-28: qty 90, 90d supply, fill #0

## 2023-10-28 NOTE — Telephone Encounter (Signed)
Medication has been refilled. Pt notified.

## 2023-10-28 NOTE — Telephone Encounter (Signed)
Prescription Request  10/28/2023  LOV: Visit date not found  What is the name of the medication or equipment? omeprazole (PRILOSEC) 20 MG capsule  Have you contacted your pharmacy to request a refill? Yes   Which pharmacy would you like this sent to?  [facility] [address] [place] [address] Phone: [phone] Fax: [phone]    Patient notified that their request is being sent to the clinical staff for review and that they should receive a response within 2 business days.   Please advise at Mobile [phone]  Patient states he is out of this medication.  Patient has scheduled an appointment to see Rennie Plowman, FNP, on 11/02/2023.

## 2023-10-30 ENCOUNTER — Ambulatory Visit
Admission: RE | Admit: 2023-10-30 | Discharge: 2023-10-30 | Disposition: A | Discharge status: Home / Self Care | Payer: 59 | Source: Ambulatory Visit | Attending: Orthopedic Surgery | Admitting: Orthopedic Surgery

## 2023-10-30 DIAGNOSIS — M25551 Pain in right hip: Secondary | ICD-10-CM | POA: Insufficient documentation

## 2023-10-30 DIAGNOSIS — G4733 Obstructive sleep apnea (adult) (pediatric): Secondary | ICD-10-CM | POA: Diagnosis not present

## 2023-11-02 ENCOUNTER — Telehealth: Payer: Self-pay | Admitting: Family Medicine

## 2023-11-02 ENCOUNTER — Ambulatory Visit (INDEPENDENT_AMBULATORY_CARE_PROVIDER_SITE_OTHER): Payer: 59 | Admitting: Family

## 2023-11-02 ENCOUNTER — Other Ambulatory Visit: Payer: Self-pay

## 2023-11-02 ENCOUNTER — Encounter: Payer: Self-pay | Admitting: Family

## 2023-11-02 VITALS — BP 122/82 | HR 76 | Temp 98.1°F | Ht 74.0 in | Wt 217.8 lb

## 2023-11-02 DIAGNOSIS — I10 Essential (primary) hypertension: Secondary | ICD-10-CM

## 2023-11-02 DIAGNOSIS — Z13220 Encounter for screening for lipoid disorders: Secondary | ICD-10-CM | POA: Diagnosis not present

## 2023-11-02 DIAGNOSIS — Z0001 Encounter for general adult medical examination with abnormal findings: Secondary | ICD-10-CM | POA: Diagnosis not present

## 2023-11-02 DIAGNOSIS — Z125 Encounter for screening for malignant neoplasm of prostate: Secondary | ICD-10-CM

## 2023-11-02 DIAGNOSIS — G4733 Obstructive sleep apnea (adult) (pediatric): Secondary | ICD-10-CM | POA: Diagnosis not present

## 2023-11-02 DIAGNOSIS — Z8249 Family history of ischemic heart disease and other diseases of the circulatory system: Secondary | ICD-10-CM | POA: Diagnosis not present

## 2023-11-02 DIAGNOSIS — K219 Gastro-esophageal reflux disease without esophagitis: Secondary | ICD-10-CM | POA: Diagnosis not present

## 2023-11-02 DIAGNOSIS — Z136 Encounter for screening for cardiovascular disorders: Secondary | ICD-10-CM

## 2023-11-02 DIAGNOSIS — R413 Other amnesia: Secondary | ICD-10-CM | POA: Diagnosis not present

## 2023-11-02 DIAGNOSIS — F419 Anxiety disorder, unspecified: Secondary | ICD-10-CM | POA: Insufficient documentation

## 2023-11-02 LAB — COMPREHENSIVE METABOLIC PANEL
ALT: 65 U/L — ABNORMAL HIGH (ref 0–53)
AST: 38 U/L — ABNORMAL HIGH (ref 0–37)
Albumin: 5.2 g/dL (ref 3.5–5.2)
Alkaline Phosphatase: 84 U/L (ref 39–117)
BUN: 22 mg/dL (ref 6–23)
CO2: 28 meq/L (ref 19–32)
Calcium: 9.9 mg/dL (ref 8.4–10.5)
Chloride: 103 meq/L (ref 96–112)
Creatinine, Ser: 1.11 mg/dL (ref 0.40–1.50)
GFR: 74.95 mL/min (ref >60.00)
Glucose, Bld: 93 mg/dL (ref 70–99)
Potassium: 4.5 meq/L (ref 3.5–5.1)
Sodium: 140 meq/L (ref 135–145)
Total Bilirubin: 0.8 mg/dL (ref 0.2–1.2)
Total Protein: 7 g/dL (ref 6.0–8.3)

## 2023-11-02 LAB — CBC WITH DIFFERENTIAL/PLATELET
Basophils Absolute: 0.1 10*3/uL (ref 0.0–0.1)
Basophils Relative: 1.1 % (ref 0.0–3.0)
Eosinophils Absolute: 0.2 10*3/uL (ref 0.0–0.7)
Eosinophils Relative: 2.6 % (ref 0.0–5.0)
HCT: 45.7 % (ref 39.0–52.0)
Hemoglobin: 16 g/dL (ref 13.0–17.0)
Lymphocytes Relative: 19.2 % (ref 12.0–46.0)
Lymphs Abs: 1.4 10*3/uL (ref 0.7–4.0)
MCHC: 35 g/dL (ref 30.0–36.0)
MCV: 91.7 fL (ref 78.0–100.0)
Monocytes Absolute: 0.5 10*3/uL (ref 0.1–1.0)
Monocytes Relative: 6.1 % (ref 3.0–12.0)
Neutro Abs: 5.3 10*3/uL (ref 1.4–7.7)
Neutrophils Relative %: 71 % (ref 43.0–77.0)
Platelets: 178 10*3/uL (ref 150.0–400.0)
RBC: 4.99 Mil/uL (ref 4.22–5.81)
RDW: 13.3 % (ref 11.5–15.5)
WBC: 7.5 10*3/uL (ref 4.0–10.5)

## 2023-11-02 LAB — LIPID PANEL
Cholesterol: 186 mg/dL (ref 0–200)
HDL: 46.9 mg/dL (ref >39.00)
LDL Cholesterol: 114 mg/dL — ABNORMAL HIGH (ref 0–99)
NonHDL: 139.43
Total CHOL/HDL Ratio: 4
Triglycerides: 128 mg/dL (ref 0.0–149.0)
VLDL: 25.6 mg/dL (ref 0.0–40.0)

## 2023-11-02 LAB — HEMOGLOBIN A1C: Hgb A1c MFr Bld: 4.9 % (ref 4.6–6.5)

## 2023-11-02 LAB — B12 AND FOLATE PANEL
Folate: 10.7 ng/mL (ref >5.9)
Vitamin B-12: 246 pg/mL (ref 211–911)

## 2023-11-02 LAB — PSA: PSA: 0.38 ng/mL (ref 0.10–4.00)

## 2023-11-02 MED ORDER — SERTRALINE HCL 100 MG PO TABS
100.0000 mg | ORAL_TABLET | Freq: Every day | ORAL | 3 refills | Status: DC
  Filled 2023-11-02 – 2023-12-01 (×2): qty 90, 90d supply, fill #0
  Filled 2024-02-19: qty 90, 90d supply, fill #1
  Filled 2024-05-24: qty 90, 90d supply, fill #2
  Filled 2024-08-23: qty 90, 90d supply, fill #3

## 2023-11-02 MED ORDER — OMEPRAZOLE 20 MG PO CPDR
20.0000 mg | DELAYED_RELEASE_CAPSULE | Freq: Every day | ORAL | 3 refills | Status: DC
  Filled 2023-11-02 – 2024-01-25 (×2): qty 90, 90d supply, fill #0
  Filled 2024-04-20: qty 90, 90d supply, fill #1
  Filled 2024-07-18: qty 90, 90d supply, fill #2
  Filled 2024-10-14: qty 90, 90d supply, fill #3

## 2023-11-02 MED ORDER — AMLODIPINE BESYLATE 10 MG PO TABS
10.0000 mg | ORAL_TABLET | Freq: Every day | ORAL | 3 refills | Status: DC
  Filled 2023-11-02 – 2023-12-01 (×2): qty 90, 90d supply, fill #0
  Filled 2024-02-19: qty 90, 90d supply, fill #1
  Filled 2024-05-24: qty 90, 90d supply, fill #2
  Filled 2024-08-15: qty 90, 90d supply, fill #3

## 2023-11-02 NOTE — Assessment & Plan Note (Signed)
Fortunately patient is asymptomatic.  Based on family history, we jointly agreed CT calcium score is appropriate to further stratify his CV risk.  Pending lipid panel as well.

## 2023-11-02 NOTE — Assessment & Plan Note (Signed)
Chronic, stable.  Continue Zoloft 100 mg qd

## 2023-11-02 NOTE — Assessment & Plan Note (Signed)
Encourage exercise.  Colonoscopy up-to-date.

## 2023-11-02 NOTE — Assessment & Plan Note (Signed)
Chronic, stable.  No alarm features at this time.  Continue omeprazole 40 mg daily for now

## 2023-11-02 NOTE — Progress Notes (Signed)
Assessment & Plan:  Encounter for general adult medical examination with abnormal findings Assessment & Plan: Encourage exercise.  Colonoscopy up-to-date.  Orders: -     Hemoglobin A1c -     CBC with Differential/Platelet -     Comprehensive metabolic panel -     Insulin, random  OSA on CPAP Assessment & Plan: Compliant with Bipap   Memory impairment Assessment & Plan: Subtle changes over time.  Mini-Mental status exam 30/30  Patient politely declines baseline MRI brain which I advised to evaluate for ischemic disease. Pending labs. Will monitor.   Orders: -     Insulin, random -     B12 and Folate Panel -     RPR  Anxiety Assessment & Plan: Chronic, stable.  Continue Zoloft 100 mg qd  Orders: -     Sertraline HCl; Take 1 tablet (100 mg total) by mouth daily.  Dispense: 90 tablet; Refill: 3  Family history of ASCVD Assessment & Plan: Fortunately patient is asymptomatic.  Based on family history, we jointly agreed CT calcium score is appropriate to further stratify his CV risk.  Pending lipid panel as well.   Orders: -     Lipid panel -     CT CARDIAC SCORING (SELF PAY ONLY); Future  Screening for prostate cancer -     PSA  Encounter for lipid screening for cardiovascular disease -     Lipid panel  Primary hypertension -     amLODIPine Besylate; Take 1 tablet (10 mg total) by mouth daily.  Dispense: 90 tablet; Refill: 3  Gastroesophageal reflux disease, unspecified whether esophagitis present Assessment & Plan: Chronic, stable.  No alarm features at this time.  Continue omeprazole 40 mg daily for now  Orders: -     Omeprazole; Take 1 capsule (20 mg total) by mouth daily.  Dispense: 90 capsule; Refill: 3     Return precautions given.   Risks, benefits, and alternatives of the medications and treatment plan prescribed today were discussed, and patient expressed understanding.   Education regarding symptom management and diagnosis given to patient on  AVS either electronically or printed.  Return for Complete Physical Exam.  Rennie Plowman, FNP  Subjective:    Patient ID: Daniel Calderon, male    DOB: [birth year], 57 y.o.   MRN: [id number]  CC: Daniel Calderon is a 57 y.o. male who presents today for physical exam.    HPI: He complains of increased forgetfulness of late.  He will stand in a room and then may struggle to remember what he came into the room to get.  Trouble finding words.  He has not forgotten loves names.  He reports a 'here and there' migraine. No vision loss, confusion.   Sleeping well.  He feels anxiety s well controlled.   He is compliant with Bipap machine nightly.   Denies CP, wheezing, SOB.   He is longer following with pulmonology      MRI brain 11/2019  CT cervical spine 11/2019 Cervical spine spondylosis most notable at C6-7  CTA head 11/2019 Unremarkable CTA of the head. No intracranial large vessel occlusion or proximal high-grade arterial stenosis.   GERD is well controlled on prilosec 40mg . No trouble swallowing, pain with swallowing.   Continues to follow with urology for ED and dermatology for Psoriasis  He is also following with Dr. Allena Katz in regards to right hip pain.  Awaiting results of MRI right hip.   Colorectal  Cancer Screening: UTD ,  09/2023; repeat in 5 years  Prostate Cancer Screening: Has been discussed with patient.  Denies urinary frequency, decreased urine stream.  He does have nocturia.   Lung Cancer Screening: No 30 year pack year history and > 50 years to 80 years.  Immunizations       Tetanus - UTD        Exercise: Gets regular exercise, walking at work.  Alcohol use:  occassional Smoking/tobacco use: Nonsmoker.    Health Maintenance  Topic Date Due   Zoster (Shingles) Vaccine (1 of 2) Never done   COVID-19 Vaccine (2 - Pfizer risk series) 06/28/2021   DTaP/Tdap/Td vaccine (2 - Td or Tdap) 12/24/2023   Colon Cancer Screening  10/15/2027   Flu Shot   Completed   Hepatitis C Screening  Completed   HIV Screening  Completed   HPV Vaccine  Aged Out     ALLERGIES: Erythromycin, Cephalexin, Dexamethasone, Oxycodone-acetaminophen, Prednisone, and Gabapentin  Current Outpatient Medications on File Prior to Visit  Medication Sig Dispense Refill   AMBULATORY NON FORMULARY MEDICATION Trimix (30/1/10)-(Pap/Phent/PGE)  Test Dose  1ml vial   Qty #3 Refills 0  [facility] [phone] Fax [phone] 1 mL 0   Guselkumab (TREMFYA) 100 MG/ML SOPN Inject 100 mg into the skin every 8 (eight) weeks. For maintenance. 1 mL 5   Guselkumab (TREMFYA) 100 MG/ML SOPN Inject 100 mg into the skin as directed. At week 0 & 4. 2 mL 0   Current Facility-Administered Medications on File Prior to Visit  Medication Dose Route Frequency Provider Last Rate Last Admin   phenylephrine 100 mcg/mL CONC. dilution injection for priapism ([facility] USE ONLY)  200 mcg Intracavernosal Once         Review of Systems  Constitutional:  Negative for chills and fever.  HENT:  Negative for congestion.   Respiratory:  Negative for cough.   Cardiovascular:  Negative for chest pain, palpitations and leg swelling.  Gastrointestinal:  Negative for diarrhea, nausea and vomiting.  Musculoskeletal:  Negative for myalgias.  Skin:  Negative for rash.  Neurological:  Negative for headaches.  Hematological:  Negative for adenopathy.  Psychiatric/Behavioral:  Negative for confusion.       Objective:    BP 122/82   Pulse 76   Temp 98.1 F (36.7 C) (Oral)   Ht 6\' 2"  (1.88 m)   Wt 217 lb 12.8 oz (98.8 kg)   SpO2 96%   BMI 27.96 kg/m   BP Readings from Last 3 Encounters:  11/02/23 122/82  08/18/23 125/76  08/06/23 121/81   Wt Readings from Last 3 Encounters:  11/02/23 217 lb 12.8 oz (98.8 kg)  08/06/23 215 lb (97.5 kg)  07/08/23 215 lb (97.5 kg)      11/02/2023    9:39 AM 09/30/2022    8:36 AM 04/08/2022   11:32 AM  Depression screen  PHQ 2/9  Decreased Interest 0 0 0  Down, Depressed, Hopeless 0 0 0  PHQ - 2 Score 0 0 0  Altered sleeping 0    Tired, decreased energy 1    Change in appetite 0    Feeling bad or failure about yourself  0    Trouble concentrating 0    Moving slowly or fidgety/restless 0    Suicidal thoughts 0    PHQ-9 Score 1    Difficult doing work/chores Not difficult at all       Physical Exam Vitals reviewed.  Constitutional:  Appearance: Normal appearance. He is well-developed.  Neck:     Thyroid: No thyroid mass or thyromegaly.  Cardiovascular:     Rate and Rhythm: Regular rhythm.     Heart sounds: Normal heart sounds.  Pulmonary:     Effort: Pulmonary effort is normal. No respiratory distress.     Breath sounds: Normal breath sounds. No wheezing, rhonchi or rales.  Abdominal:     General: Bowel sounds are normal. There is no distension.     Palpations: Abdomen is soft. Abdomen is not rigid. There is no fluid wave or mass.     Tenderness: There is no abdominal tenderness. There is no guarding or rebound. Negative signs include Murphy's sign and McBurney's sign.  Lymphadenopathy:     Head:     Right side of head: No submental, submandibular, tonsillar, preauricular, posterior auricular or occipital adenopathy.     Left side of head: No submental, submandibular, tonsillar, preauricular, posterior auricular or occipital adenopathy.     Cervical: No cervical adenopathy.  Skin:    General: Skin is warm and dry.  Neurological:     Mental Status: He is alert.  Psychiatric:        Speech: Speech normal.        Behavior: Behavior normal.

## 2023-11-02 NOTE — Assessment & Plan Note (Addendum)
Subtle changes over time.  Mini-Mental status exam 30/30  Patient politely declines baseline MRI brain which I advised to evaluate for ischemic disease. Pending labs. Will monitor.

## 2023-11-02 NOTE — Patient Instructions (Addendum)
I have ordered CT calcium score ( SELF PAY option)  to further stratify your overall cardiovascular risk .   An estimate of cost is $150-200 out-of-pocket as not covered by insurance. However please call your insurance company in advance to ensure no other costs so that you do not have any unexpected bills.     I have placed your order to [facility] in [place] as  generally most convenient.   Phone Number to [facility] on [address] is is [phone] to get scheduled.   Please call to get scheduled and if any issues at all in doing so, please let me know.   Below an article from [facility] regarding the test.   [email or web address]/exams-[email or web address]-[email or web address]-ct#:~:text=A%[email or web address]%20CT%20calcium%20score,arteries%20can%20cause%20heart%20attacks.   Exams We Offer: Cardiac CT Calcium Score  Knowing your score could save your life. A cardiac CT calcium score, also known as a coronary calcium scan, is a quick, convenient and noninvasive way of evaluating the amount of calcified (hard) plaque in your heart vessels. The level of calcium equates to the extent of plaque build-up in your arteries. Plaque in the arteries can cause heart attacks.  The radiologist reads the images and sends your doctor a report with a calcium score. Patients with higher scores have a greater risk for a heart attack, heart disease or stroke. Knowing your score can help your doctor decide on blood pressure and cholesterol goals that will minimize your risk as much as possible.  The [employer] of Cardiology found that Coronary artery calcification (CAC) is an excellent cardiovascular disease risk marker and can help guide the decision to use cholesterol reducing medications such as statins. A negative calcium score may reduce the need for statins in otherwise eligible patients.  The exam takes less than 10 minutes, is painless and does not require any IV or  oral contrast. At [facility] Imaging locations, the out-of-pocket fee without insurance is $75. At the time of scheduling, please let us know if you want to process the exam through your insurance or self-pay at the rate of $75. Patients who want to self-pay should not submit their insurance card when checking in for the appointment.   Who should get a Cardiac CT Calcium Score: Middle age adults at intermediate risk of heart disease Family history of heart disease Borderline high cholesterol, high blood pressure or diabetes Overweight or physical inactivity Uncertain about taking daily preventive medical therapy  Health Maintenance, Male Adopting a healthy lifestyle and getting preventive care are important in promoting health and wellness. Ask your health care provider about: The right schedule for you to have regular tests and exams. Things you can do on your own to prevent diseases and keep yourself healthy. What should I know about diet, weight, and exercise? Eat a healthy diet  Eat a diet that includes plenty of vegetables, fruits, low-fat dairy products, and lean protein. Do not eat a lot of foods that are high in solid fats, added sugars, or sodium. Maintain a healthy weight Body mass index (BMI) is a measurement that can be used to identify possible weight problems. It estimates body fat based on height and weight. Your health care provider can help determine your BMI and help you achieve or maintain a healthy weight. Get regular exercise Get regular exercise. This is one of the most important things you can do for your health. Most adults should: Exercise for at least 150 minutes each week. The exercise should increase your heart rate and  make you sweat (moderate-intensity exercise). Do strengthening exercises at least twice a week. This is in addition to the moderate-intensity exercise. Spend less time sitting. Even light physical activity can be beneficial. Watch  cholesterol and blood lipids Have your blood tested for lipids and cholesterol at 55 years of age, then have this test every 5 years. You may need to have your cholesterol levels checked more often if: Your lipid or cholesterol levels are high. You are older than 55 years of age. You are at high risk for heart disease. What should I know about cancer screening? Many types of cancers can be detected early and may often be prevented. Depending on your health history and family history, you may need to have cancer screening at various ages. This may include screening for: Colorectal cancer. Prostate cancer. Skin cancer. Lung cancer. What should I know about heart disease, diabetes, and high blood pressure? Blood pressure and heart disease High blood pressure causes heart disease and increases the risk of stroke. This is more likely to develop in people who have high blood pressure readings or are overweight. Talk with your health care provider about your target blood pressure readings. Have your blood pressure checked: Every 3-5 years if you are 68-73 years of age. Every year if you are 80 years old or older. If you are between the ages of 11 and 21 and are a current or former smoker, ask your health care provider if you should have a one-time screening for abdominal aortic aneurysm (AAA). Diabetes Have regular diabetes screenings. This checks your fasting blood sugar level. Have the screening done: Once every three years after age 57 if you are at a normal weight and have a low risk for diabetes. More often and at a younger age if you are overweight or have a high risk for diabetes. What should I know about preventing infection? Hepatitis B If you have a higher risk for hepatitis B, you should be screened for this virus. Talk with your health care provider to find out if you are at risk for hepatitis B infection. Hepatitis C Blood testing is recommended for: Everyone born from [birth year] through  1965. Anyone with known risk factors for hepatitis C. Sexually transmitted infections (STIs) You should be screened each year for STIs, including gonorrhea and chlamydia, if: You are sexually active and are younger than 55 years of age. You are older than 55 years of age and your health care provider tells you that you are at risk for this type of infection. Your sexual activity has changed since you were last screened, and you are at increased risk for chlamydia or gonorrhea. Ask your health care provider if you are at risk. Ask your health care provider about whether you are at high risk for HIV. Your health care provider may recommend a prescription medicine to help prevent HIV infection. If you choose to take medicine to prevent HIV, you should first get tested for HIV. You should then be tested every 3 months for as long as you are taking the medicine. Follow these instructions at home: Alcohol use Do not drink alcohol if your health care provider tells you not to drink. If you drink alcohol: Limit how much you have to 0-2 drinks a day. Know how much alcohol is in your drink. In the [place] one drink equals one 12 oz bottle of beer (355 mL), one 5 oz glass of wine (148 mL), or one 1 oz glass of hard liquor (44  mL). Lifestyle Do not use any products that contain nicotine or tobacco. These products include cigarettes, chewing tobacco, and vaping devices, such as e-cigarettes. If you need help quitting, ask your health care provider. Do not use street drugs. Do not share needles. Ask your health care provider for help if you need support or information about quitting drugs. General instructions Schedule regular health, dental, and eye exams. Stay current with your vaccines. Tell your health care provider if: You often feel depressed. You have ever been abused or do not feel safe at home. Summary Adopting a healthy lifestyle and getting preventive care are important in promoting health and  wellness. Follow your health care provider's instructions about healthy diet, exercising, and getting tested or screened for diseases. Follow your health care provider's instructions on monitoring your cholesterol and blood pressure. This information is not intended to replace advice given to you by your health care provider. Make sure you discuss any questions you have with your health care provider. Document Revised: 04/29/2021 Document Reviewed: 04/29/2021 Elsevier Patient Education  2024 [employer].

## 2023-11-02 NOTE — Assessment & Plan Note (Signed)
 Compliant with [employer].

## 2023-11-02 NOTE — Telephone Encounter (Signed)
Lft pt vm to call ofc to sch CT. thanks 

## 2023-11-03 ENCOUNTER — Ambulatory Visit: Payer: 59 | Admitting: Dermatology

## 2023-11-03 ENCOUNTER — Telehealth: Payer: Self-pay | Admitting: Family Medicine

## 2023-11-03 DIAGNOSIS — Z7189 Other specified counseling: Secondary | ICD-10-CM | POA: Diagnosis not present

## 2023-11-03 DIAGNOSIS — L4050 Arthropathic psoriasis, unspecified: Secondary | ICD-10-CM

## 2023-11-03 DIAGNOSIS — L820 Inflamed seborrheic keratosis: Secondary | ICD-10-CM | POA: Diagnosis not present

## 2023-11-03 DIAGNOSIS — Z79899 Other long term (current) drug therapy: Secondary | ICD-10-CM

## 2023-11-03 DIAGNOSIS — L409 Psoriasis, unspecified: Secondary | ICD-10-CM | POA: Diagnosis not present

## 2023-11-03 LAB — INSULIN, RANDOM: Insulin: 15.8 u[IU]/mL

## 2023-11-03 LAB — RPR: RPR Ser Ql: NONREACTIVE

## 2023-11-03 NOTE — Progress Notes (Signed)
Follow-Up Visit   Subjective  Daniel Calderon is a 57 y.o. male who presents for the following: Psoriasis with PsA on the legs, knees, elbows. Not using any topicals (too greasy) or biologics currently. Patient never received additional injections at home of Tremfya. He would like to restart Dobratz [place] (gave GI upset).  He has also tried Careers adviser (stopped working), Bimzelx (mood changes).  Patient has a growth on the left upper inner arm that's itchy and bleeds at times.    The following portions of the chart were reviewed this encounter and updated as appropriate: medications, allergies, medical history  Review of Systems:  No other skin or systemic complaints except as noted in HPI or Assessment and Plan.  Objective  Well appearing patient in no apparent distress; mood and affect are within normal limits.  Areas Examined: Face, legs, elbows  Relevant exam findings are noted in the Assessment and Plan.    L upper inner arm x 1 Erythematous stuck-on, waxy papule     Assessment & Plan   Inflamed seborrheic keratosis L upper inner arm x 1  Symptomatic, irritating, patient would like treated.  Destruction of lesion - L upper inner arm x 1  Destruction method: cryotherapy   Informed consent: discussed and consent obtained   Lesion destroyed using liquid nitrogen: Yes   Region frozen until ice ball extended beyond lesion: Yes   Outcome: patient tolerated procedure well with no complications   Post-procedure details: wound care instructions given   Additional details:  Prior to procedure, discussed risks of blister formation, small wound, skin dyspigmentation, or rare scar following cryotherapy. Recommend Vaseline ointment to treated areas while healing.    PSORIASIS WITH PsA  Pink scaly papules and plaques on the bilateral lower legs, right knee, ankles; xerosis on elbows 6% BSA.  Chronic and persistent condition with duration or expected duration over one year. Condition  is bothersome/symptomatic for patient. Currently flared.   Counseling on psoriasis and coordination of care  psoriasis is a chronic non-curable, but treatable genetic/hereditary disease that may have other systemic features affecting other organ systems such as joints (Psoriatic Arthritis). It is associated with an increased risk of inflammatory bowel disease, heart disease, non-alcoholic fatty liver disease, and depression.  Treatments include light and laser treatments; topical medications; and systemic medications including oral and injectables.  Patient with joint pain  Treatment Plan: QuantiFERON-TB Gold negative 08/27/2022. Patient is screened yearly through [facility].   Start Sotyktu 6 MG take 1 po every day. Sample pack given today x 1. Patient will contact us if he would like [name] to send Rx in and he will come in for another sample.   Sernivo Spray sample given today apply qd. Lot WCBN-1 Exp 01/2024. Patient will call if he would like Rx sent in.   Discussed potential risks/benefits of Sotyktu which is an oral biologic medication used for treating moderate to severe psoriasis. Overall potential side effects are minimal and include upper respiratory infections, cold sores, and mild acne/folliculitis. Labs are checked before starting, including TB screening test, and then annually for long term medication management.   Long term medication management.  Patient is using long term (months to years) prescription medication  to control their dermatologic condition.  These medications require periodic monitoring to evaluate for efficacy and side effects and may require periodic laboratory monitoring.    Return in about 3 months (around 02/03/2024) for Psoriasis.  ICherlyn Labella, CMA, am acting as scribe for [employer],  MD .   Documentation: I have reviewed the above documentation for accuracy and completeness, and I agree with the above.  Willeen Niece, MD

## 2023-11-03 NOTE — Patient Instructions (Addendum)

## 2023-11-03 NOTE — Telephone Encounter (Signed)
Lft pt vm to call ofc to sch CT. thanks 

## 2023-11-05 ENCOUNTER — Ambulatory Visit
Admission: RE | Admit: 2023-11-05 | Discharge: 2023-11-05 | Disposition: A | Discharge status: Home / Self Care | Payer: 59 | Source: Ambulatory Visit | Attending: Family | Admitting: Family

## 2023-11-05 DIAGNOSIS — Z8249 Family history of ischemic heart disease and other diseases of the circulatory system: Secondary | ICD-10-CM | POA: Insufficient documentation

## 2023-11-06 ENCOUNTER — Encounter: Payer: Self-pay | Admitting: Family

## 2023-11-06 ENCOUNTER — Ambulatory Visit: Payer: 59

## 2023-11-06 ENCOUNTER — Other Ambulatory Visit: Payer: Self-pay | Admitting: Family

## 2023-11-06 DIAGNOSIS — R899 Unspecified abnormal finding in specimens from other organs, systems and tissues: Secondary | ICD-10-CM

## 2023-11-08 DIAGNOSIS — G4733 Obstructive sleep apnea (adult) (pediatric): Secondary | ICD-10-CM | POA: Diagnosis not present

## 2023-11-10 ENCOUNTER — Telehealth: Payer: Self-pay

## 2023-11-10 NOTE — Telephone Encounter (Signed)
-----   Message from Daniel Calderon sent at 11/06/2023 11:46 AM EST ----- Review result note Schedule labs in the next couple weeks This patient okay with proceeding with ultrasound of the liver?  Please let me know

## 2023-11-10 NOTE — Telephone Encounter (Signed)
Lvm for pt to give office a call back to schedule lab appt, see Claris Che message below

## 2023-11-18 ENCOUNTER — Other Ambulatory Visit: Payer: Self-pay | Admitting: Family

## 2023-11-18 DIAGNOSIS — R9389 Abnormal findings on diagnostic imaging of other specified body structures: Secondary | ICD-10-CM

## 2023-11-18 NOTE — Telephone Encounter (Signed)
Detailed VMN left asking pt to CB in regards to arnett's note

## 2023-11-23 ENCOUNTER — Telehealth: Payer: Self-pay

## 2023-11-23 NOTE — Telephone Encounter (Signed)
Lvm for pt to give office a call back in regards to results and mychart msgs      Daniel Calderon,   The over read for your CT calcium score raises concern for opacity in the left lower lobe.  I would recommend a dedicated chest x-ray to ensure no infection is present.  I have ordered a chest x-ray to be obtained at [facility] medical mall.  Our office is closed 11/28 and 11/29 so you are unable to have a chest x-ray in our office.  I would recommend having the chest x-ray done as soon as you are able.  Certainly if you are having acute symptoms, worrisome cough fever, please go to the nearest urgent care to be evaluated in person.   Please also let me know your thoughts in regards to rechecking liver enzymes and potentially starting Crestor 10 mg.   Regards, Margaret  Written by Allegra Grana, FNP on 11/18/2023  9:21 PM EST    Daniel Calderon,   Based on family history of heart disease, I would recommend LDL goal less than 70.  CT calcium score shows calcium score of 9.09, 52nd percentile.   Are you agreeable to starting cholesterol medication such as Crestor 10 mg daily?   If yes, I would recommend first proceeding with ultrasound of liver and then rechecking liver enzymes in a couple of weeks to ensure not trending up.   We would need to closely monitor liver enzymes on Crestor.  Please let me know your thoughts.         Regards, Claris Che

## 2023-11-23 NOTE — Telephone Encounter (Signed)
-----   Message from Daniel Calderon sent at 11/18/2023  9:21 PM EST ----- Call patient Review result note Ensure he is seen result note and knows to obtain chest x-ray at [facility]

## 2023-11-24 ENCOUNTER — Encounter: Payer: Self-pay | Admitting: *Deleted

## 2023-11-25 NOTE — Telephone Encounter (Addendum)
error 

## 2023-11-29 DIAGNOSIS — G4733 Obstructive sleep apnea (adult) (pediatric): Secondary | ICD-10-CM | POA: Diagnosis not present

## 2023-12-01 ENCOUNTER — Other Ambulatory Visit: Payer: Self-pay

## 2023-12-02 NOTE — Telephone Encounter (Signed)
Lvm for pt in regards to completed chest xray. If ot is in agreement will need an appt for xray

## 2023-12-03 NOTE — Telephone Encounter (Signed)
Patient states he was told he needed to have an x-ray but he doesn't have an appointment and isn't sure what to do.  I let patient know that Rennie Plowman, FNP, has put an order in for him to have an x-ray at the [facility] and he does not need to have an appointment, he can just walk-in.  Patient was questioning this, so I spoke with Jenate [place], CMA, to verify that this is correct.  Loletta Specter states this is correct.  I relayed message to patient.

## 2023-12-08 DIAGNOSIS — G4733 Obstructive sleep apnea (adult) (pediatric): Secondary | ICD-10-CM | POA: Diagnosis not present

## 2023-12-09 ENCOUNTER — Telehealth: Payer: Self-pay

## 2023-12-09 MED ORDER — SOTYKTU 6 MG PO TABS
ORAL_TABLET | ORAL | 5 refills | Status: DC
  Filled 2023-12-25: qty 30, 30d supply, fill #0

## 2023-12-09 NOTE — Telephone Encounter (Signed)
I returned pt's call. He states psoriasis is improving as in his legs are no longer bleeding, but not clearing. Pt would like to continue Sotyktu medication. 30 day sample left for patient at front desk check in and prescription sent to [facility].

## 2023-12-10 ENCOUNTER — Other Ambulatory Visit (HOSPITAL_COMMUNITY): Payer: Self-pay

## 2023-12-20 ENCOUNTER — Other Ambulatory Visit (HOSPITAL_COMMUNITY): Payer: Self-pay

## 2023-12-21 ENCOUNTER — Ambulatory Visit
Admission: RE | Admit: 2023-12-21 | Discharge: 2023-12-21 | Disposition: A | Discharge status: Home / Self Care | Payer: 59 | Source: Ambulatory Visit | Attending: Family | Admitting: *Deleted

## 2023-12-21 ENCOUNTER — Other Ambulatory Visit: Payer: Self-pay

## 2023-12-21 ENCOUNTER — Ambulatory Visit
Admission: RE | Admit: 2023-12-21 | Discharge: 2023-12-21 | Disposition: A | Discharge status: Home / Self Care | Payer: 59 | Source: Ambulatory Visit | Attending: Family | Admitting: Family

## 2023-12-21 ENCOUNTER — Other Ambulatory Visit (HOSPITAL_COMMUNITY): Payer: Self-pay

## 2023-12-21 DIAGNOSIS — R9389 Abnormal findings on diagnostic imaging of other specified body structures: Secondary | ICD-10-CM | POA: Insufficient documentation

## 2023-12-21 DIAGNOSIS — R918 Other nonspecific abnormal finding of lung field: Secondary | ICD-10-CM | POA: Diagnosis not present

## 2023-12-22 ENCOUNTER — Other Ambulatory Visit: Payer: Self-pay

## 2023-12-22 ENCOUNTER — Other Ambulatory Visit (HOSPITAL_COMMUNITY): Payer: Self-pay

## 2023-12-24 ENCOUNTER — Other Ambulatory Visit: Payer: Self-pay

## 2023-12-24 ENCOUNTER — Other Ambulatory Visit (HOSPITAL_COMMUNITY): Payer: Self-pay

## 2023-12-24 NOTE — Progress Notes (Signed)
 Pharmacy Patient Advocate Encounter   Received notification from Patient Pharmacy that prior authorization for Sotyktu  is required/requested.   Insurance verification completed.   The patient is insured through [place] .   Per test claim: PA required; PA submitted to above mentioned insurance via CoverMyMeds Key/confirmation #/[facility] Status is pending

## 2023-12-25 ENCOUNTER — Other Ambulatory Visit (HOSPITAL_COMMUNITY): Payer: Self-pay

## 2023-12-25 NOTE — Progress Notes (Signed)
 Pharmacy Patient Advocate Encounter  Received notification from [facility] that Prior Authorization for Sotyktu has been APPROVED from 12/17/23 to 04/15/24   PA #/Case ID/Reference #: [id number]

## 2023-12-28 ENCOUNTER — Other Ambulatory Visit: Payer: Self-pay

## 2023-12-29 ENCOUNTER — Encounter: Payer: Self-pay | Admitting: Family

## 2023-12-29 ENCOUNTER — Telehealth: Payer: Self-pay | Admitting: Family

## 2023-12-29 NOTE — Telephone Encounter (Signed)
 Left message for patient to call and reschedule TOC to the next Thursday. Please reschedule appoinmtent.

## 2023-12-30 ENCOUNTER — Other Ambulatory Visit: Payer: Self-pay

## 2023-12-30 ENCOUNTER — Telehealth: Payer: Self-pay | Admitting: Pharmacist

## 2023-12-30 DIAGNOSIS — G4733 Obstructive sleep apnea (adult) (pediatric): Secondary | ICD-10-CM | POA: Diagnosis not present

## 2023-12-30 NOTE — Telephone Encounter (Signed)
 Called patient to schedule an appointment for the [facility]. I was unable to reach the patient so I left a HIPAA-compliant message requesting that the patient return my call.   Herlene Fleeta [place], PharmD, JAQUELINE, CPP Clinical Pharmacist [facility] [phone]

## 2023-12-31 ENCOUNTER — Other Ambulatory Visit: Payer: Self-pay

## 2023-12-31 DIAGNOSIS — M5412 Radiculopathy, cervical region: Secondary | ICD-10-CM | POA: Diagnosis not present

## 2023-12-31 MED ORDER — METHYLPREDNISOLONE 4 MG PO TBPK
ORAL_TABLET | ORAL | 0 refills | Status: AC
  Filled 2023-12-31: qty 21, 6d supply, fill #0

## 2023-12-31 MED ORDER — METHOCARBAMOL 750 MG PO TABS
750.0000 mg | ORAL_TABLET | Freq: Three times a day (TID) | ORAL | 1 refills | Status: DC
  Filled 2023-12-31: qty 30, 10d supply, fill #0

## 2023-12-31 MED ORDER — MELOXICAM 15 MG PO TABS
15.0000 mg | ORAL_TABLET | Freq: Every day | ORAL | 2 refills | Status: DC
  Filled 2023-12-31: qty 30, 30d supply, fill #0

## 2024-01-04 ENCOUNTER — Other Ambulatory Visit (HOSPITAL_COMMUNITY): Payer: Self-pay

## 2024-01-04 ENCOUNTER — Other Ambulatory Visit: Payer: Self-pay

## 2024-01-04 ENCOUNTER — Encounter: Payer: Self-pay | Admitting: Family

## 2024-01-04 ENCOUNTER — Encounter: Payer: Self-pay | Admitting: Family Medicine

## 2024-01-04 ENCOUNTER — Ambulatory Visit (INDEPENDENT_AMBULATORY_CARE_PROVIDER_SITE_OTHER): Payer: 59 | Admitting: Family Medicine

## 2024-01-04 ENCOUNTER — Other Ambulatory Visit: Payer: Self-pay | Admitting: Family

## 2024-01-04 VITALS — BP 118/70 | HR 83 | Temp 98.0°F | Resp 18 | Ht 74.0 in | Wt 215.0 lb

## 2024-01-04 DIAGNOSIS — M5412 Radiculopathy, cervical region: Secondary | ICD-10-CM | POA: Diagnosis not present

## 2024-01-04 DIAGNOSIS — R9389 Abnormal findings on diagnostic imaging of other specified body structures: Secondary | ICD-10-CM | POA: Diagnosis not present

## 2024-01-04 NOTE — Progress Notes (Signed)
 SUBJECTIVE:   Chief Complaint  Patient presents with   Hospitalization Follow-up    Shoulder Pain right shoulder    HPI Presents for hospital follow up  Discussed the use of AI scribe software for clinical note transcription with the patient, who gave verbal consent to proceed.  History of Present Illness The patient, with a history of cervical radiculopathy, presents with a three-week history of neck pain radiating to the right shoulder. The pain began suddenly and has persisted, leading the patient to seek physical therapy and dry needling, which only provided temporary relief and subsequently exacerbated the symptoms. The patient reports a pulsating sensation in the arm, cramping, and soreness, which is exacerbated by neck movement. The patient also reports chest pain, which has been occurring for several years, and has increased in frequency with the current episode of neck pain.  The patient was seen in [facility], where x-rays were taken and a course of muscle relaxants and steroids was initiated. The patient is on the last day of the steroid course, which has allowed some arm movement but has not improved overall function. The patient also reports potential blurred vision, which they attribute to the medication.  The patient has a history of receiving injections for cervical radiculopathy several years ago, which provided relief at the time. 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 The patient has been trying to avoid repetitive movements and labor-intensive work due to the current symptoms. The patient also reports difficulty sleeping due to the pain, which is exacerbated by any movement.  The patient is currently taking a quarter of the prescribed muscle relaxant at night, as taking the full dose causes excessive drowsiness. The patient also reports that the pain has slightly improved since starting the medication regimen.   PERTINENT PMH / PSH: As  above  OBJECTIVE:  BP 118/70   Pulse 83   Temp 98 F (36.7 C)   Resp 18   Ht 6' 2 (1.88 m)   Wt 215 lb (97.5 kg)   SpO2 96%   BMI 27.60 kg/m    Physical Exam Vitals reviewed.  Constitutional:      General: He is not in acute distress.    Appearance: He is obese. He is not ill-appearing or toxic-appearing.  Musculoskeletal:        General: No deformity or signs of injury. Normal range of motion.     Right shoulder: Tenderness present. Normal range of motion.     Cervical back: Normal range of motion.        01/04/2024   10:55 AM 11/02/2023    9:39 AM 09/30/2022    8:36 AM 04/08/2022   11:32 AM 02/13/2020    1:44 PM  Depression screen PHQ 2/9  Decreased Interest 0 0 0 0 0  Down, Depressed, Hopeless 0 0 0 0 0  PHQ - 2 Score 0 0 0 0 0  Altered sleeping 0 0     Tired, decreased energy 0 1     Change in appetite 0 0     Feeling bad or failure about yourself  0 0     Trouble concentrating 0 0     Moving slowly or fidgety/restless 3 0     Suicidal thoughts 0 0     PHQ-9 Score 3 1     Difficult doing work/chores Not difficult at all Not difficult at all         01/04/2024   10:55 AM  11/02/2023    9:40 AM  GAD 7 : Generalized Anxiety Score  Nervous, Anxious, on Edge 0 0  Control/stop worrying 0 0  Worry too much - different things 0 0  Trouble relaxing 0 0  Restless 0 0  Easily annoyed or irritable 0 0  Afraid - awful might happen 0 0  Total GAD 7 Score 0 0  Anxiety Difficulty Not difficult at all Not difficult at all    ASSESSMENT/PLAN:  Cervical radiculopathy Assessment & Plan: Persistent neck pain radiating to the right shoulder despite physical therapy and dry needling. Currently on a tapering course of steroids and muscle relaxants with partial relief. -Follow up with Ortho for further evaluation and possible imaging. -Consider increasing frequency of Robaxin  if tolerable. -Avoid repetitive movements and heavy labor at work.   Abnormal chest  CT Assessment & Plan: Cardiac CT 10/2023 noted left lower lobe ground-glass opacity and minimal bilateral dependent atelectasis noted Chest xray 11/2023 Opacity seen on prior CT scan is not well visualized on these radiographs and recommended CT chest  -CT chest has been placed by PCP today.  Patient is aware and will f/u with primary    PDMP reviewed  Return if symptoms worsen or fail to improve, for PCP.  Glenys Ferrari, MD

## 2024-01-04 NOTE — Patient Instructions (Addendum)
 It was a pleasure meeting you today. Thank you for allowing me to take part in your health care.  Our goals for today as we discussed include:  Complete Steroid pack as prescribed by Orthopedics Continue Robaxin  750 mg three times a day as needed If worsening symptoms follow up with Ortho.  Tylenol  500 mg every 6 hours as needed Lidocaine  patch every 12 hours as needed Diclofenac gel every 6 hours as needed Heat/Ice as needed Massage therapy Gentle low back stretching daily Can refer to physical therapy if no improvement   CT chest has been ordered by PCP. They will call you with appointment   This is a list of the screening recommended for you and due dates:  Health Maintenance  Topic Date Due   Pneumococcal Vaccination (1 of 2 - PCV) Never done   Zoster (Shingles) Vaccine (1 of 2) Never done   COVID-19 Vaccine (2 - Pfizer risk series) 06/28/2021   DTaP/Tdap/Td vaccine (2 - Td or Tdap) 12/24/2023   Colon Cancer Screening  10/15/2027   Flu Shot  Completed   Hepatitis C Screening  Completed   HIV Screening  Completed   HPV Vaccine  Aged Out    Follow up with PCP as needed  If you have any questions or concerns, please do not hesitate to call the office at [phone].  I look forward to our next visit and until then take care and stay safe.  Regards,   Glenys Ferrari, MD   [facility]

## 2024-01-04 NOTE — Progress Notes (Signed)
Ordered CT chest w contrast

## 2024-01-05 ENCOUNTER — Telehealth: Payer: Self-pay | Admitting: Family

## 2024-01-05 ENCOUNTER — Other Ambulatory Visit: Payer: Self-pay

## 2024-01-05 NOTE — Telephone Encounter (Signed)
 Lft pt vm to call ofc to sch CT. thanks

## 2024-01-06 ENCOUNTER — Ambulatory Visit: Payer: Commercial Managed Care - PPO | Attending: Family | Admitting: Pharmacist

## 2024-01-06 ENCOUNTER — Other Ambulatory Visit: Payer: Self-pay

## 2024-01-06 ENCOUNTER — Other Ambulatory Visit: Payer: Self-pay | Admitting: Pharmacist

## 2024-01-06 ENCOUNTER — Telehealth: Payer: Self-pay | Admitting: Family

## 2024-01-06 DIAGNOSIS — Z79899 Other long term (current) drug therapy: Secondary | ICD-10-CM

## 2024-01-06 DIAGNOSIS — M542 Cervicalgia: Secondary | ICD-10-CM | POA: Diagnosis not present

## 2024-01-06 MED ORDER — SOTYKTU 6 MG PO TABS
ORAL_TABLET | ORAL | 5 refills | Status: DC
  Filled 2024-01-07: qty 30, 30d supply, fill #0
  Filled 2024-01-29: qty 30, 30d supply, fill #1
  Filled 2024-03-16: qty 30, 30d supply, fill #2
  Filled 2024-04-29 (×2): qty 30, 30d supply, fill #3
  Filled 2024-06-06: qty 30, 30d supply, fill #4
  Filled 2024-07-06 – 2024-07-07 (×2): qty 30, 30d supply, fill #5

## 2024-01-06 NOTE — Telephone Encounter (Signed)
 Call returned. Documented as a Programmer, systems.

## 2024-01-06 NOTE — Progress Notes (Signed)
 Please see OV from 01/06/24 for complete documentation.   Marene Shape, PharmD, Becky Bowels, CPP Clinical Pharmacist [facility] [phone]

## 2024-01-06 NOTE — Telephone Encounter (Signed)
 Pt returning call to speak to [facility]. Pt requesting call back, can be reached at [phone] for today.

## 2024-01-06 NOTE — Progress Notes (Signed)
   S: Patient presents today for review of their specialty medication.   Patient is currently taking Sotyktu for psoriasis. Patient is managed by Dr. Roseanne Reno for this.   Dosing: Plaque Psoriasis: 6mg  PO once daily   Adherence: confirms. Has been taking samples so far  Efficacy: Reports great results so far!   Monitoring:  TB screening: completed Hep B screening: completed Triglycerides: results from 11/02/23 wnl  LFTs: history of transaminitis with most recent results from 11/02/23 improved from prior results.  S/sx of infection: none  Current adverse effects: -Skin changes/folliculitis: none -Oral sores: none -Muscle pain: none -Angioedema: none  O:     Lab Results  Component Value Date   WBC 7.5 11/02/2023   HGB 16.0 11/02/2023   HCT 45.7 11/02/2023   MCV 91.7 11/02/2023   PLT 178.0 11/02/2023      Chemistry      Component Value Date/Time   NA 140 11/02/2023 1018   NA 143 08/27/2022 1145   K 4.5 11/02/2023 1018   CL 103 11/02/2023 1018   CO2 28 11/02/2023 1018   BUN 22 11/02/2023 1018   BUN 17 08/27/2022 1145   CREATININE 1.11 11/02/2023 1018   CREATININE 1.26 05/24/2021 1600   GLU 100 10/02/2017 0000      Component Value Date/Time   CALCIUM 9.9 11/02/2023 1018   ALKPHOS 84 11/02/2023 1018   AST 38 (H) 11/02/2023 1018   ALT 65 (H) 11/02/2023 1018   BILITOT 0.8 11/02/2023 1018   BILITOT 0.7 08/27/2022 1145       A/P: 1. Medication review: patient currently on Deucravacitinib for plaque psoriasis and is tolerating it well. Reviewed the medication with the patient including the following: deucravacitinib is an oral tyrosine kinase 2 inhibitor used in the treatment of plaque psoriasis. It works by blocking tyrosine kinase 2 and these leads to decreased release of pro-inflammatory cytokines. It can be taken with or without food but should not be crushed or chewed. It should not be used in patients with higher cardiovascular risk, hepatic dysfunction (liver  transaminase levels greater than 3x UNL), previous hypersensitivity reactions to deucravacitinib, active infection, or a history of increased CK levels or a hx of rhabdomyolysis. Patients should avoid live vaccines during treatment. Malignancies have been reported and use should be avoided in patients with prior malignancies. Common adverse effects include folliculitis, oral ulcers, and/or URI. While rare, other adverse effects such as increased triglycerides, infection, malignancy, or angioedema have occurred. Oral tablets should be stored in a dry area at room temperature. No recommendation for any changes at this time.   Butch Penny, PharmD, Patsy [place], CPP Clinical Pharmacist [facility] [phone]

## 2024-01-06 NOTE — Telephone Encounter (Signed)
 Lft pt vm to call ofc to sch CT. thanks

## 2024-01-07 ENCOUNTER — Other Ambulatory Visit (HOSPITAL_COMMUNITY): Payer: Self-pay

## 2024-01-07 ENCOUNTER — Other Ambulatory Visit: Payer: Self-pay

## 2024-01-07 ENCOUNTER — Telehealth: Payer: Self-pay

## 2024-01-07 NOTE — Telephone Encounter (Signed)
Noted  

## 2024-01-07 NOTE — Progress Notes (Signed)
Specialty Pharmacy Initial Fill Coordination Note  Daniel Calderon is a 58 y.o. male contacted today regarding initial fill of specialty medication(s) Deucravacitinib (Sotyktu)   Patient requested Delivery   Delivery date: 01/12/24   Verified address: [address]   Medication will be filled on 1/20.   Patient is aware of $0 copayment.

## 2024-01-07 NOTE — Telephone Encounter (Signed)
 Error

## 2024-01-08 ENCOUNTER — Encounter: Payer: Self-pay | Admitting: Family Medicine

## 2024-01-08 ENCOUNTER — Other Ambulatory Visit: Payer: Self-pay | Admitting: Family

## 2024-01-08 DIAGNOSIS — R9389 Abnormal findings on diagnostic imaging of other specified body structures: Secondary | ICD-10-CM | POA: Insufficient documentation

## 2024-01-08 DIAGNOSIS — M5412 Radiculopathy, cervical region: Secondary | ICD-10-CM | POA: Diagnosis not present

## 2024-01-08 NOTE — Assessment & Plan Note (Signed)
Cardiac CT 10/2023 noted left lower lobe ground-glass opacity and minimal bilateral dependent atelectasis noted Chest xray 11/2023 Opacity seen on prior CT scan is not well visualized on these radiographs and recommended CT chest  -CT chest has been placed by PCP today.  Patient is aware and will f/u with primary

## 2024-01-08 NOTE — Assessment & Plan Note (Signed)
Persistent neck pain radiating to the right shoulder despite physical therapy and dry needling. Currently on a tapering course of steroids and muscle relaxants with partial relief. -Follow up with Ortho for further evaluation and possible imaging. -Consider increasing frequency of Robaxin if tolerable. -Avoid repetitive movements and heavy labor at work.

## 2024-01-09 ENCOUNTER — Other Ambulatory Visit: Payer: Self-pay | Admitting: Family

## 2024-01-09 ENCOUNTER — Telehealth: Payer: Self-pay | Admitting: Family

## 2024-01-11 ENCOUNTER — Other Ambulatory Visit: Payer: Self-pay

## 2024-01-11 ENCOUNTER — Telehealth: Payer: Self-pay | Admitting: Family

## 2024-01-11 DIAGNOSIS — I10 Essential (primary) hypertension: Secondary | ICD-10-CM

## 2024-01-11 DIAGNOSIS — M5412 Radiculopathy, cervical region: Secondary | ICD-10-CM | POA: Diagnosis not present

## 2024-01-11 MED ORDER — PREGABALIN 50 MG PO CAPS
50.0000 mg | ORAL_CAPSULE | Freq: Two times a day (BID) | ORAL | 0 refills | Status: DC
  Filled 2024-01-11: qty 60, 30d supply, fill #0

## 2024-01-11 NOTE — Telephone Encounter (Signed)
Ct chest sch 01/18/24

## 2024-01-11 NOTE — Telephone Encounter (Signed)
BMP is ordered and Patient is scheduled for 01/14/24.

## 2024-01-11 NOTE — Telephone Encounter (Signed)
Call pt  Radiology called and he need kidney function prior to CT chest 01/18/24  Please order BMP lab and sch in the next 4 days so we get back in time.

## 2024-01-14 ENCOUNTER — Other Ambulatory Visit: Payer: Commercial Managed Care - PPO

## 2024-01-14 DIAGNOSIS — M5412 Radiculopathy, cervical region: Secondary | ICD-10-CM | POA: Diagnosis not present

## 2024-01-15 NOTE — Telephone Encounter (Signed)
Call pt  It looks like he missed his lab appointment this week.  He must have his renal function checked before they will do CT chest.  Please schedule ASAP

## 2024-01-15 NOTE — Telephone Encounter (Signed)
Called pt and rescheduled him for 01/18/24

## 2024-01-18 ENCOUNTER — Other Ambulatory Visit: Payer: Commercial Managed Care - PPO

## 2024-01-18 ENCOUNTER — Ambulatory Visit: Admission: RE | Admit: 2024-01-18 | Payer: Commercial Managed Care - PPO | Source: Ambulatory Visit

## 2024-01-18 ENCOUNTER — Telehealth: Payer: Self-pay

## 2024-01-18 ENCOUNTER — Ambulatory Visit
Admission: RE | Admit: 2024-01-18 | Discharge: 2024-01-18 | Disposition: A | Discharge status: Home / Self Care | Payer: Commercial Managed Care - PPO | Source: Ambulatory Visit | Attending: Family | Admitting: Family

## 2024-01-18 ENCOUNTER — Other Ambulatory Visit
Admission: RE | Admit: 2024-01-18 | Discharge: 2024-01-18 | Disposition: A | Discharge status: Home / Self Care | Payer: Commercial Managed Care - PPO | Source: Home / Self Care | Attending: Family | Admitting: Family

## 2024-01-18 DIAGNOSIS — I10 Essential (primary) hypertension: Secondary | ICD-10-CM

## 2024-01-18 DIAGNOSIS — R9389 Abnormal findings on diagnostic imaging of other specified body structures: Secondary | ICD-10-CM | POA: Insufficient documentation

## 2024-01-18 DIAGNOSIS — R918 Other nonspecific abnormal finding of lung field: Secondary | ICD-10-CM | POA: Diagnosis not present

## 2024-01-18 LAB — BASIC METABOLIC PANEL
Anion gap: 10 (ref 5–15)
BUN: 22 mg/dL — ABNORMAL HIGH (ref 6–20)
CO2: 24 mmol/L (ref 22–32)
Calcium: 9.7 mg/dL (ref 8.9–10.3)
Chloride: 103 mmol/L (ref 98–111)
Creatinine, Ser: 0.9 mg/dL (ref 0.61–1.24)
GFR, Estimated: >60 mL/min (ref >60)
Glucose, Bld: 99 mg/dL (ref 70–99)
Potassium: 3.9 mmol/L (ref 3.5–5.1)
Sodium: 137 mmol/L (ref 135–145)

## 2024-01-18 MED ORDER — IOHEXOL 300 MG/ML  SOLN
75.0000 mL | Freq: Once | INTRAMUSCULAR | Status: AC | PRN
  Administered 2024-01-18: 75 mL via INTRAVENOUS

## 2024-01-18 NOTE — Addendum Note (Signed)
Addended by: [place], Amberli Ruegg on: 01/18/2024 10:01 AM   Modules accepted: Orders

## 2024-01-18 NOTE — Telephone Encounter (Signed)
Copied from CRM [id number]. Topic: Clinical - Request for Lab/Test Order >> Jan 18, 2024  8:34 AM [name] wrote: Reason for CRM: Patient would like labs to be done at [facility] center this morning since he works there and has a CT scheduled. Needs labs in order to have CT. Please enter lab orders to be done at [facility].

## 2024-01-19 ENCOUNTER — Telehealth: Payer: Self-pay | Admitting: Neurosurgery

## 2024-01-19 ENCOUNTER — Inpatient Hospital Stay
Admission: RE | Admit: 2024-01-19 | Discharge: 2024-01-19 | Disposition: A | Discharge status: Home / Self Care | Payer: Self-pay | Source: Ambulatory Visit | Attending: Neurosurgery | Admitting: Neurosurgery

## 2024-01-19 ENCOUNTER — Other Ambulatory Visit: Payer: Self-pay | Admitting: Family Medicine

## 2024-01-19 ENCOUNTER — Encounter: Payer: Self-pay | Admitting: Family

## 2024-01-19 DIAGNOSIS — Z049 Encounter for examination and observation for unspecified reason: Secondary | ICD-10-CM

## 2024-01-19 NOTE — Telephone Encounter (Addendum)
[facility] offered him a C5-7 ACDF. [facility] note from 01/11/24 states he is 4-/5 in the right triceps. MRI cervical spine shows severe neuroforaminal narrowing C5-7 on the right. I do not see record of physical therapy. However, due to the weakness mentioned in [facility]'s note, I think it's OK to schedule with Dr Myer Haff for a second opinion.

## 2024-01-19 NOTE — Telephone Encounter (Signed)
Images have been loaded in chart.

## 2024-01-19 NOTE — Telephone Encounter (Signed)
Notes from [facility] scanned into patient's chart.

## 2024-01-19 NOTE — Telephone Encounter (Signed)
Patient dropped off his MRI and report from [facility]. The report has been scanned into his chart and disc placed in basket to review. Patient is working on obtaining notes from [facility]. 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 Myer Haff for a second opinion. Please advise where to schedule.

## 2024-01-20 NOTE — Progress Notes (Unsigned)
Referring Physician:  Allegra Grana, FNP [address] [place],  [place] [address]  Primary Physician:  Allegra Grana, FNP  History of Present Illness: 01/21/2024 Daniel Calderon is here today with a chief complaint of severe right arm pain.  He also has neck pain.  He has numbness, tingling, and weakness in his right arm.  He has longstanding left forearm numbness secondary to thoracic outlet syndrome on the left side.  All his recent symptoms for the past several weeks have been on the right side.  Moving his neck causes severe pain.  All activities make his pain worse.  He has severe pain rating into his right shoulder blade and down his triceps and into his forearm.  He has numbness in the 2nd through 4th fingers.  He had an injection last week which helped a small amount. Bowel/Bladder Dysfunction: none  Conservative measures:  Physical therapy: has not participated in PT  Multimodal medical therapy including regular antiinflammatories: Lyrica, Medrol pak, Meloxicam, Methocarbamol  Injections: no epidural steroid injections  Past Surgery: none  Daniel Calderon has no symptoms of cervical myelopathy.  The symptoms are causing a significant impact on the patient's life.   I have utilized the care everywhere function in epic to review the outside records available from external health systems.  Review of Systems:  A 10 point review of systems is negative, except for the pertinent positives and negatives detailed in the HPI.  Past Medical History: Past Medical History:  Diagnosis Date   Allergy    Seasonal   Anxiety    Arthritis    Benign enlargement of prostate    Complex sleep apnea syndrome    Depression    Elevated BP    Elevated transaminase level    Failure of erection    Fatigue    Frequent headaches    GERD (gastroesophageal reflux disease)    History of kidney stones    Hypertension    Hypogonadism in male    Migraine    NAFL  (nonalcoholic fatty liver)    Sinus congestion 09/01/2017   Sleep apnea    Currently uses cpap   Splenomegaly    Thoracic outlet syndrome    Left Arm   Tongue pain 02/09/2019    Past Surgical History: Past Surgical History:  Procedure Laterality Date   COLONOSCOPY WITH PROPOFOL N/A 11/11/2017   Procedure: COLONOSCOPY WITH PROPOFOL;  Surgeon: Toney Reil, MD;  Location: [facility];  Service: Endoscopy;  Laterality: N/A;   COLONOSCOPY WITH PROPOFOL N/A 10/14/2022   Procedure: COLONOSCOPY WITH PROPOFOL;  Surgeon: Toney Reil, MD;  Location: [facility];  Service: Endoscopy;  Laterality: N/A;  sleep apnea   ESOPHAGOGASTRODUODENOSCOPY (EGD) WITH PROPOFOL N/A 08/25/2019   Procedure: ESOPHAGOGASTRODUODENOSCOPY (EGD) WITH PROPOFOL;  Surgeon: Toney Reil, MD;  Location: [facility];  Service: Gastroenterology;  Laterality: N/A;   KNEE ARTHROSCOPY WITH MEDIAL MENISECTOMY Left 07/26/2018   Procedure: KNEE ARTHROSCOPY WITH PARTIAL MEDIAL MENISECTOMY;  Surgeon: Signa Kell, MD;  Location: [facility];  Service: Orthopedics;  Laterality: Left;   Left Shoulder Surgery  2011   nerve block     2 in neck. 5 in back.   NOSE SURGERY     POLYPECTOMY  11/11/2017   Procedure: POLYPECTOMY;  Surgeon: Toney Reil, MD;  Location: [facility];  Service: Endoscopy;;   SCALENE NODE BIOPSY / EXCISION     VASECTOMY  Allergies: Allergies as of 01/21/2024 - Review Complete 01/21/2024  Allergen Reaction Noted   Erythromycin Nausea And Vomiting 03/24/2015   Cephalexin Other (See Comments) 05/07/2015   Dexamethasone Other (See Comments) 05/07/2015   Oxycodone-acetaminophen Other (See Comments) 05/16/2016   Prednisone Other (See Comments) 05/07/2015   Gabapentin Other (See Comments) 06/26/2015    Medications:  Current Outpatient Medications:    AMBULATORY NON FORMULARY MEDICATION, Trimix (30/1/10)-(Pap/Phent/PGE)  Test Dose  1ml vial   Qty #3 Refills 0   [facility] [phone] Fax [phone], Disp: 1 mL, Rfl: 0   amLODipine (NORVASC) 10 MG tablet, Take 1 tablet (10 mg total) by mouth daily., Disp: 90 tablet, Rfl: 3   Deucravacitinib (SOTYKTU) 6 MG TABS, Take one tablet by mouth once daily., Disp: 30 tablet, Rfl: 5   meloxicam (MOBIC) 15 MG tablet, Take 1 tablet (15 mg total) by mouth daily., Disp: 30 tablet, Rfl: 2   methocarbamol (ROBAXIN) 750 MG tablet, Take 1 tablet (750 mg total) by mouth 3 (three) times daily., Disp: 30 tablet, Rfl: 1   omeprazole (PRILOSEC) 20 MG capsule, Take 1 capsule (20 mg total) by mouth daily., Disp: 90 capsule, Rfl: 3   pregabalin (LYRICA) 50 MG capsule, Take 1 capsule (50 mg total) by mouth 2 (two) times daily., Disp: 60 capsule, Rfl: 0   sertraline (ZOLOFT) 100 MG tablet, Take 1 tablet (100 mg total) by mouth daily., Disp: 90 tablet, Rfl: 3  Current Facility-Administered Medications:    phenylephrine 100 mcg/mL CONC. dilution injection for priapism ([facility] USE ONLY), 200 mcg, Intracavernosal, Once,   Social History: Social History   Tobacco Use   Smoking status: Never   Smokeless tobacco: Never  Vaping Use   Vaping status: Never Used  Substance Use Topics   Alcohol use: Not Currently    Alcohol/week: 10.0 standard drinks of alcohol    Types: 10 Standard drinks or equivalent per week    Comment: Drinks beer and Liquor   Drug use: No    Family Medical History: Family History  Problem Relation Age of Onset   Hypertension Mother        Living   Hearing loss Mother    Stroke Mother 55   Heart attack Mother 82   Skin cancer Father        believes melanoma   Colon cancer Sister 65       stage 3 colon cancer   Diabetes Brother    Hearing loss Maternal Grandmother    Healthy Daughter    Healthy Son    Stroke Paternal Uncle    Heart attack Paternal Uncle    Heart disease Other    Kidney cancer Neg Hx    Bladder Cancer Neg Hx    Prostate cancer Neg Hx      Physical Examination: Vitals:   01/21/24 1109  BP: (!) 142/70    General: Patient is in no apparent distress. Attention to examination is appropriate.  Neck:   Supple.  Limited rotation to the right due to severe pain.  Respiratory: Patient is breathing without any difficulty.   NEUROLOGICAL:     Awake, alert, oriented to person, place, and time.  Speech is clear and fluent.   Cranial Nerves: Pupils equal round and reactive to light.  Facial tone is symmetric.  Facial sensation is symmetric. Shoulder shrug is symmetric. Tongue protrusion is midline.    Strength: Side Biceps Triceps Deltoid Interossei Grip Wrist Ext. Wrist Flex.  R 5  3 5 5  4- 4 5  L 5 5 5 5 5 5 5    Side Iliopsoas Quads Hamstring PF DF EHL  R 5 5 5 5 5 5   L 5 5 5 5 5 5    Reflexes are 1+ and symmetric at the biceps, triceps, brachioradialis, patella and achilles.   Hoffman's is absent.   Bilateral upper and lower extremity sensation is intact to light touch.    No evidence of dysmetria noted.  Gait is normal.     Medical Decision Making  Imaging: MRI C spine 01/08/2024 Right foraminal C6-7 disc herniation with severe right neuroforaminal narrowing and impingement of the exiting right C7 nerve root.  Moderate left C5-6 neuroforaminal narrowing.  I have personally reviewed the images and agree with the above interpretation.  Assessment and Plan: Daniel Calderon is a pleasant 57 y.o. male with right C7 radiculopathy with profound weakness in his right arm.  His weakness is primarily in his right triceps.  Given his profound weakness, I do not feel that conservative management is indicated.  Have recommended surgical intervention.  We reviewed the options.  I recommend C6-7 arthroplasty.  I discussed the planned procedure at length with the patient, including the risks, benefits, alternatives, and indications. The risks discussed include but are not limited to bleeding, infection, need for reoperation,  spinal fluid leak, stroke, vision loss, anesthetic complication, coma, paralysis, and even death. We also discussed the possibility of post-operative dysphagia, vocal cord paralysis, and the risk of adjacent segment disease in the future. I also described in detail that improvement was not guaranteed.  The patient expressed understanding of these risks, and asked that we proceed with surgery. I described the surgery in layman's terms, and gave ample opportunity for questions, which were answered to the best of my ability.  I spent a total of 30 minutes in this patient's care today. This time was spent reviewing pertinent records including imaging studies, obtaining and confirming history, performing a directed evaluation, formulating and discussing my recommendations, and documenting the visit within the medical record.      Thank you for involving me in the care of this patient.      Carrel Leather K. Myer Haff MD, [facility]

## 2024-01-20 NOTE — H&P (View-Only) (Signed)
Referring Physician:  Allegra Grana, FNP [address] [place],  [place] [address]  Primary Physician:  Allegra Grana, FNP  History of Present Illness: 01/21/2024 [name] is here today with a chief complaint of severe right arm pain.  He also has neck pain.  He has numbness, tingling, and weakness in his right arm.  He has longstanding left forearm numbness secondary to thoracic outlet syndrome on the left side.  All his recent symptoms for the past several weeks have been on the right side.  Moving his neck causes severe pain.  All activities make his pain worse.  He has severe pain rating into his right shoulder blade and down his triceps and into his forearm.  He has numbness in the 2nd through 4th fingers.  He had an injection last week which helped a small amount. Bowel/Bladder Dysfunction: none  Conservative measures:  Physical therapy: has not participated in PT  Multimodal medical therapy including regular antiinflammatories: Lyrica, Medrol pak, Meloxicam, Methocarbamol  Injections: no epidural steroid injections  Past Surgery: none  Daniel Calderon has no symptoms of cervical myelopathy.  The symptoms are causing a significant impact on the patient's life.   I have utilized the care everywhere function in epic to review the outside records available from external health systems.  Review of Systems:  A 10 point review of systems is negative, except for the pertinent positives and negatives detailed in the HPI.  Past Medical History: Past Medical History:  Diagnosis Date   Allergy    Seasonal   Anxiety    Arthritis    Benign enlargement of prostate    Complex sleep apnea syndrome    Depression    Elevated BP    Elevated transaminase level    Failure of erection    Fatigue    Frequent headaches    GERD (gastroesophageal reflux disease)    History of kidney stones    Hypertension    Hypogonadism in male    Migraine    NAFL  (nonalcoholic fatty liver)    Sinus congestion 09/01/2017   Sleep apnea    Currently uses cpap   Splenomegaly    Thoracic outlet syndrome    Left Arm   Tongue pain 02/09/2019    Past Surgical History: Past Surgical History:  Procedure Laterality Date   COLONOSCOPY WITH PROPOFOL N/A 11/11/2017   Procedure: COLONOSCOPY WITH PROPOFOL;  Surgeon: Toney Reil, MD;  Location: [facility];  Service: Endoscopy;  Laterality: N/A;   COLONOSCOPY WITH PROPOFOL N/A 10/14/2022   Procedure: COLONOSCOPY WITH PROPOFOL;  Surgeon: Toney Reil, MD;  Location: [facility];  Service: Endoscopy;  Laterality: N/A;  sleep apnea   ESOPHAGOGASTRODUODENOSCOPY (EGD) WITH PROPOFOL N/A 08/25/2019   Procedure: ESOPHAGOGASTRODUODENOSCOPY (EGD) WITH PROPOFOL;  Surgeon: Toney Reil, MD;  Location: [facility];  Service: Gastroenterology;  Laterality: N/A;   KNEE ARTHROSCOPY WITH MEDIAL MENISECTOMY Left 07/26/2018   Procedure: KNEE ARTHROSCOPY WITH PARTIAL MEDIAL MENISECTOMY;  Surgeon: Signa Kell, MD;  Location: [facility] OR[facility];  Service: Orthopedics;  Laterality: Left;   Left Shoulder Surgery  2011   nerve block     2 in neck. 5 in back.   NOSE SURGERY     POLYPECTOMY  11/11/2017   Procedure: POLYPECTOMY;  Surgeon: Toney Reil, MD;  Location: [facility];  Service: Endoscopy;;   SCALENE NODE BIOPSY / EXCISION     VASECTOMY  Allergies: Allergies as of 01/21/2024 - Review Complete 01/21/2024  Allergen Reaction Noted   Erythromycin Nausea And Vomiting 03/24/2015   Cephalexin Other (See Comments) 05/07/2015   Dexamethasone Other (See Comments) 05/07/2015   Oxycodone-acetaminophen Other (See Comments) 05/16/2016   Prednisone Other (See Comments) 05/07/2015   Gabapentin Other (See Comments) 06/26/2015    Medications:  Current Outpatient Medications:    AMBULATORY NON FORMULARY MEDICATION, Trimix (30/1/10)-(Pap/Phent/PGE)  Test Dose  1ml vial   Qty #3 Refills 0   [facility] [phone] Fax [phone], Disp: 1 mL, Rfl: 0   amLODipine (NORVASC) 10 MG tablet, Take 1 tablet (10 mg total) by mouth daily., Disp: 90 tablet, Rfl: 3   Deucravacitinib (SOTYKTU) 6 MG TABS, Take one tablet by mouth once daily., Disp: 30 tablet, Rfl: 5   meloxicam (MOBIC) 15 MG tablet, Take 1 tablet (15 mg total) by mouth daily., Disp: 30 tablet, Rfl: 2   methocarbamol (ROBAXIN) 750 MG tablet, Take 1 tablet (750 mg total) by mouth 3 (three) times daily., Disp: 30 tablet, Rfl: 1   omeprazole (PRILOSEC) 20 MG capsule, Take 1 capsule (20 mg total) by mouth daily., Disp: 90 capsule, Rfl: 3   pregabalin (LYRICA) 50 MG capsule, Take 1 capsule (50 mg total) by mouth 2 (two) times daily., Disp: 60 capsule, Rfl: 0   sertraline (ZOLOFT) 100 MG tablet, Take 1 tablet (100 mg total) by mouth daily., Disp: 90 tablet, Rfl: 3  Current Facility-Administered Medications:    phenylephrine 100 mcg/mL CONC. dilution injection for priapism ([facility] USE ONLY), 200 mcg, Intracavernosal, Once,   Social History: Social History   Tobacco Use   Smoking status: Never   Smokeless tobacco: Never  Vaping Use   Vaping status: Never Used  Substance Use Topics   Alcohol use: Not Currently    Alcohol/week: 10.0 standard drinks of alcohol    Types: 10 Standard drinks or equivalent per week    Comment: Drinks beer and Liquor   Drug use: No    Family Medical History: Family History  Problem Relation Age of Onset   Hypertension Mother        Living   Hearing loss Mother    Stroke Mother 64   Heart attack Mother 61   Skin cancer Father        believes melanoma   Colon cancer Sister 16       stage 3 colon cancer   Diabetes Brother    Hearing loss Maternal Grandmother    Healthy Daughter    Healthy Son    Stroke Paternal Uncle    Heart attack Paternal Uncle    Heart disease Other    Kidney cancer Neg Hx    Bladder Cancer Neg Hx    Prostate cancer Neg Hx      Physical Examination: Vitals:   01/21/24 1109  BP: (!) 142/70    General: Patient is in no apparent distress. Attention to examination is appropriate.  Neck:   Supple.  Limited rotation to the right due to severe pain.  Respiratory: Patient is breathing without any difficulty.   NEUROLOGICAL:     Awake, alert, oriented to person, place, and time.  Speech is clear and fluent.   Cranial Nerves: Pupils equal round and reactive to light.  Facial tone is symmetric.  Facial sensation is symmetric. Shoulder shrug is symmetric. Tongue protrusion is midline.    Strength: Side Biceps Triceps Deltoid Interossei Grip Wrist Ext. Wrist Flex.  R 5  3 5 5  4- 4 5  L 5 5 5 5 5 5 5    Side Iliopsoas Quads Hamstring PF DF EHL  R 5 5 5 5 5 5   L 5 5 5 5 5 5    Reflexes are 1+ and symmetric at the biceps, triceps, brachioradialis, patella and achilles.   Hoffman's is absent.   Bilateral upper and lower extremity sensation is intact to light touch.    No evidence of dysmetria noted.  Gait is normal.     Medical Decision Making  Imaging: MRI C spine 01/08/2024 Right foraminal C6-7 disc herniation with severe right neuroforaminal narrowing and impingement of the exiting right C7 nerve root.  Moderate left C5-6 neuroforaminal narrowing.  I have personally reviewed the images and agree with the above interpretation.  Assessment and Plan: [name] is a pleasant 58 y.o. male with right C7 radiculopathy with profound weakness in his right arm.  His weakness is primarily in his right triceps.  Given his profound weakness, I do not feel that conservative management is indicated.  Have recommended surgical intervention.  We reviewed the options.  I recommend C6-7 arthroplasty.  I discussed the planned procedure at length with the patient, including the risks, benefits, alternatives, and indications. The risks discussed include but are not limited to bleeding, infection, need for reoperation,  spinal fluid leak, stroke, vision loss, anesthetic complication, coma, paralysis, and even death. We also discussed the possibility of post-operative dysphagia, vocal cord paralysis, and the risk of adjacent segment disease in the future. I also described in detail that improvement was not guaranteed.  The patient expressed understanding of these risks, and asked that we proceed with surgery. I described the surgery in layman's terms, and gave ample opportunity for questions, which were answered to the best of my ability.  I spent a total of 30 minutes in this patient's care today. This time was spent reviewing pertinent records including imaging studies, obtaining and confirming history, performing a directed evaluation, formulating and discussing my recommendations, and documenting the visit within the medical record.      Thank you for involving me in the care of this patient.      Hakim Minniefield K. Myer Haff MD, [facility] Neurosurgery

## 2024-01-21 ENCOUNTER — Ambulatory Visit: Payer: Commercial Managed Care - PPO | Admitting: Neurosurgery

## 2024-01-21 ENCOUNTER — Other Ambulatory Visit: Payer: Self-pay

## 2024-01-21 ENCOUNTER — Encounter: Payer: Self-pay | Admitting: Neurosurgery

## 2024-01-21 VITALS — BP 132/98 | Ht 74.0 in | Wt 216.8 lb

## 2024-01-21 DIAGNOSIS — Z01818 Encounter for other preprocedural examination: Secondary | ICD-10-CM

## 2024-01-21 DIAGNOSIS — R29898 Other symptoms and signs involving the musculoskeletal system: Secondary | ICD-10-CM | POA: Diagnosis not present

## 2024-01-21 DIAGNOSIS — M5412 Radiculopathy, cervical region: Secondary | ICD-10-CM | POA: Diagnosis not present

## 2024-01-21 NOTE — Patient Instructions (Signed)
Please see below for information in regards to your upcoming surgery:   Planned surgery: C6-7 arthroplasty   Surgery date: 02/03/24 at [facility]: [address], [place], [place] [address]) - you will find out your arrival time the business day before your surgery.   Pre-op appointment at [facility] Pre-admit Testing: we will call you with a date/time for this. If you are scheduled for an in person appointment, Pre-admit Testing is located on the first floor of the [facility] building, [address], [address]. Please bring all prescriptions in the original prescription bottles to your appointment. During this appointment, they will advise you which medications you can take the morning of surgery, and which medications you will need to hold for surgery. Labs (such as blood work, EKG) may be done at your pre-op appointment. You are not required to fast for these labs. Should you need to change your pre-op appointment, please call Pre-admit testing at [phone].     Deucravacitinib: hold for 1 week before and 2 weeks after    Surgical clearance: we will send a clearance form to Rennie Plowman, FNP and Dr Roseanne Reno. They may wish to see you in their office prior to signing the clearance form. If so, they may call you to schedule an appointment.     Common restrictions after surgery: No bending, lifting, or twisting ("BLT"). Avoid lifting objects heavier than 10 pounds for the first 6 weeks after surgery. Where possible, avoid household activities that involve lifting, bending, reaching, pushing, or pulling such as laundry, vacuuming, grocery shopping, and childcare. Try to arrange for help from friends and family for these activities while you heal. Do not drive while taking prescription pain medication. Weeks 6 through 12 after surgery: avoid lifting more than 25 pounds.    X-rays after surgery: Because you are having a fusion or  arthroplasty: for appointments after your 2 week follow-up: please arrive at the [facility] outpatient imaging center ([address], [place]) or [employer] one hour prior to your appointment for x-rays. This applies to every appointment after your 2 week follow-up. Failure to do so may result in your appointment being rescheduled.   How to contact us:  If you have any questions/concerns before or after surgery, you can reach [name] at [phone], or you can send a mychart message. We can be reached by phone or mychart 8am-4pm, Monday-Friday.  *Please note: Calls after 4pm are forwarded to a third party answering service. Mychart messages are not routinely monitored during evenings, weekends, and holidays. Please call our office to contact the answering service for urgent concerns during non-business hours.   If you have FMLA/disability paperwork, please drop it off or fax it to [phone], attention Patty.   Appointments/FMLA & disability paperwork: Joycelyn Rua, & Flonnie Hailstone Registered Nurse/Surgery scheduler: Royston Cowper Medical Assistants: Nash Mantis Physician Assistants: Joan Flores, PA-C, Manning Charity, PA-C & Drake Leach, PA-C Surgeons: Venetia Night, MD & Ernestine Mcmurray, MD

## 2024-01-22 ENCOUNTER — Ambulatory Visit: Payer: Commercial Managed Care - PPO | Admitting: Neurosurgery

## 2024-01-25 ENCOUNTER — Other Ambulatory Visit: Payer: Self-pay

## 2024-01-26 ENCOUNTER — Telehealth: Payer: Self-pay | Admitting: Family

## 2024-01-26 ENCOUNTER — Telehealth: Payer: Self-pay

## 2024-01-26 NOTE — Telephone Encounter (Signed)
 Daniel Calderon from [facility] @ [phone] to confirm that the surgery clearance form has been faxed back as of 01/26/24. For his procedure scheduled  for this month  02/03/2024 and we received confirmation that it was received.Message was taken by operator that stated she was sending message to the office. My name and office number was given

## 2024-01-26 NOTE — Telephone Encounter (Signed)
 close

## 2024-01-26 NOTE — Telephone Encounter (Signed)
 Copied from CRM [id number]. Topic: General - Other >> Jan 26, 2024  3:52 PM Russell PARAS wrote: Reason for CRM: Jackolyn from [facility] is calling to confirm if clinic has received the surgery clearance form faxed on 01/21/2024. She is requesting CB# [phone], and notes his procedure is scheduled for 02/03/2024 and needs this form as soon as possible.

## 2024-01-28 ENCOUNTER — Other Ambulatory Visit (HOSPITAL_COMMUNITY): Payer: Self-pay

## 2024-01-29 ENCOUNTER — Other Ambulatory Visit: Payer: Self-pay

## 2024-01-29 ENCOUNTER — Encounter
Admission: RE | Admit: 2024-01-29 | Discharge: 2024-01-29 | Disposition: A | Discharge status: Home / Self Care | Payer: Commercial Managed Care - PPO | Source: Ambulatory Visit | Attending: Neurosurgery | Admitting: Neurosurgery

## 2024-01-29 DIAGNOSIS — I10 Essential (primary) hypertension: Secondary | ICD-10-CM | POA: Diagnosis not present

## 2024-01-29 DIAGNOSIS — Z01818 Encounter for other preprocedural examination: Secondary | ICD-10-CM | POA: Insufficient documentation

## 2024-01-29 DIAGNOSIS — Z01812 Encounter for preprocedural laboratory examination: Secondary | ICD-10-CM | POA: Diagnosis present

## 2024-01-29 DIAGNOSIS — Z01810 Encounter for preprocedural cardiovascular examination: Secondary | ICD-10-CM | POA: Diagnosis not present

## 2024-01-29 HISTORY — DX: Obstructive sleep apnea (adult) (pediatric): G47.33

## 2024-01-29 HISTORY — DX: Psoriasis, unspecified: L40.9

## 2024-01-29 HISTORY — DX: Unspecified asthma, uncomplicated: J45.909

## 2024-01-29 HISTORY — DX: Hyperlipidemia, unspecified: E78.5

## 2024-01-29 HISTORY — DX: Fatty (change of) liver, not elsewhere classified: K76.0

## 2024-01-29 HISTORY — DX: Polyp of colon: K63.5

## 2024-01-29 LAB — TYPE AND SCREEN
ABO/RH(D): O POS
Antibody Screen: NEGATIVE

## 2024-01-29 LAB — SURGICAL PCR SCREEN
MRSA, PCR: NEGATIVE
Staphylococcus aureus: NEGATIVE

## 2024-01-29 NOTE — Telephone Encounter (Signed)
 Neurosurgery did receive fax

## 2024-01-29 NOTE — Patient Instructions (Addendum)
 Your procedure is scheduled on: Wednesday, February 12 Report to the Registration Desk on the 1st floor of the [facility] To find out your arrival time, please call [phone] between 1PM - 3PM on: Tuesday, February 11 If your arrival time is 6:00 am, do not arrive before that time as the [facility] entrance doors do not open until 6:00 am.  REMEMBER: Instructions that are not followed completely may result in serious medical risk, up to and including death; or upon the discretion of your surgeon and anesthesiologist your surgery may need to be rescheduled.  Do not eat food after midnight the night before surgery.  No gum chewing or hard candies.  You may however, drink CLEAR liquids up to 2 hours before you are scheduled to arrive for your surgery. Do not drink anything within 2 hours of your scheduled arrival time.  Clear liquids include: - water   - apple juice without pulp - gatorade (not RED colors) - black coffee or tea (Do NOT add milk or creamers to the coffee or tea) Do NOT drink anything that is not on this list.  One week prior to surgery:  Stop ANY OVER THE COUNTER supplements until after surgery. Stop vitamins.  Per Dr. Elliot orders:  HOLD Deucravacitinib  (Sotyktu ) for 1 week before and 2 weeks after surgery.  Continue taking all of your other prescription medications up until the day of surgery.  ON THE DAY OF SURGERY ONLY TAKE THESE MEDICATIONS WITH SIPS OF WATER :  sertraline  (ZOLOFT )  pregabalin  (LYRICA )  amLODipine  (NORVASC )  omeprazole  (PRILOSEC)   No Alcohol for 24 hours before or after surgery.  No Smoking including e-cigarettes for 24 hours before surgery.  No chewable tobacco products for at least 6 hours before surgery.  No nicotine patches on the day of surgery.  Do not use any recreational drugs for at least a week (preferably 2 weeks) before your surgery.  Please be advised that the combination of cocaine and anesthesia may have  negative outcomes, up to and including death. If you test positive for cocaine, your surgery will be cancelled.  On the morning of surgery brush your teeth with toothpaste and water , you may rinse your mouth with mouthwash if you wish. Do not swallow any toothpaste or mouthwash.  Use CHG Soap as directed on instruction sheet.  Do not wear jewelry, make-up, hairpins, clips or nail polish.  For welded (permanent) jewelry: bracelets, anklets, waist bands, etc.  Please have this removed prior to surgery.  If it is not removed, there is a chance that hospital personnel will need to cut it off on the day of surgery.  Do not wear lotions, powders, or perfumes.   Do not shave body hair from the neck down 48 hours before surgery.  Contact lenses, hearing aids and dentures may not be worn into surgery.  Do not bring valuables to the hospital. [facility] is not responsible for any missing/lost belongings or valuables.   Bring your Bi-PAP to the hospital in case you may have to spend the night.   Notify your doctor if there is any change in your medical condition (cold, fever, infection).  Wear comfortable clothing (specific to your surgery type) to the hospital.  After surgery, you can help prevent lung complications by doing breathing exercises.  Take deep breaths and cough every 1-2 hours. Your doctor may order a device called an Incentive Spirometer to help you take deep breaths.  If you are being discharged the day  of surgery, you will not be allowed to drive home. You will need a responsible individual to drive you home and stay with you for 24 hours after surgery.   If you are taking public transportation, you will need to have a responsible individual with you.  Please call the Pre-admissions Testing Dept. at [phone] if you have any questions about these instructions.  Surgery Visitation Policy:  Patients having surgery or a procedure may have two visitors.  Children under  the age of 80 must have an adult with them who is not the patient.  Temporary Visitor Restrictions Due to increasing cases of flu, RSV and COVID-19: Children ages 68 and under will not be able to visit patients in [facility] hospitals under most circumstances.     Pre-operative 5 CHG Bath Instructions   You can play a key role in reducing the risk of infection after surgery. Your skin needs to be as free of germs as possible. You can reduce the number of germs on your skin by washing with CHG (chlorhexidine  gluconate) soap before surgery. CHG is an antiseptic soap that kills germs and continues to kill germs even after washing.   DO NOT use if you have an allergy to chlorhexidine /CHG or antibacterial soaps. If your skin becomes reddened or irritated, stop using the CHG and notify one of our RNs at [phone].   Please shower with the CHG soap starting 4 days before surgery using the following schedule:     Please keep in mind the following:  DO NOT shave, including legs and underarms, starting the day of your first shower.   You may shave your face at any point before/day of surgery.  Place clean sheets on your bed the day you start using CHG soap. Use a clean washcloth (not used since being washed) for each shower. DO NOT sleep with pets once you start using the CHG.   CHG Shower Instructions:  If you choose to wash your hair and private area, wash first with your normal shampoo/soap.  After you use shampoo/soap, rinse your hair and body thoroughly to remove shampoo/soap residue.  Turn the water  OFF and apply about 3 tablespoons (45 ml) of CHG soap to a CLEAN washcloth.  Apply CHG soap ONLY FROM YOUR NECK DOWN TO YOUR TOES (washing for 3-5 minutes)  DO NOT use CHG soap on face, private areas, open wounds, or sores.  Pay special attention to the area where your surgery is being performed.  If you are having back surgery, having someone wash your back for you may be helpful. Wait  2 minutes after CHG soap is applied, then you may rinse off the CHG soap.  Pat dry with a clean towel  Put on clean clothes/pajamas   If you choose to wear lotion, please use ONLY the CHG-compatible lotions on the back of this paper.     Additional instructions for the day of surgery: DO NOT APPLY any lotions, deodorants, cologne, or perfumes.   Put on clean/comfortable clothes.  Brush your teeth.  Ask your nurse before applying any prescription medications to the skin.      CHG Compatible Lotions   Aveeno Moisturizing lotion  Cetaphil Moisturizing Cream  Cetaphil Moisturizing Lotion  Clairol Herbal Essence Moisturizing Lotion, Dry Skin  Clairol Herbal Essence Moisturizing Lotion, Extra Dry Skin  Clairol Herbal Essence Moisturizing Lotion, Normal Skin  Curel Age Defying Therapeutic Moisturizing Lotion with Alpha Hydroxy  Curel Extreme Care Body Lotion  Curel Soothing  Hands Moisturizing Hand Lotion  Curel Therapeutic Moisturizing Cream, Fragrance-Free  Curel Therapeutic Moisturizing Lotion, Fragrance-Free  Curel Therapeutic Moisturizing Lotion, Original Formula  Eucerin Daily Replenishing Lotion  Eucerin Dry Skin Therapy Plus Alpha Hydroxy Crme  Eucerin Dry Skin Therapy Plus Alpha Hydroxy Lotion  Eucerin Original Crme  Eucerin Original Lotion  Eucerin Plus Crme Eucerin Plus Lotion  Eucerin TriLipid Replenishing Lotion  Keri Anti-Bacterial Hand Lotion  Keri Deep Conditioning Original Lotion Dry Skin Formula Softly Scented  Keri Deep Conditioning Original Lotion, Fragrance Free Sensitive Skin Formula  Keri Lotion Fast Absorbing Fragrance Free Sensitive Skin Formula  Keri Lotion Fast Absorbing Softly Scented Dry Skin Formula  Keri Original Lotion  Keri Skin Renewal Lotion Keri Silky Smooth Lotion  Keri Silky Smooth Sensitive Skin Lotion  Nivea Body Creamy Conditioning Oil  Nivea Body Extra Enriched Teacher, Adult Education Moisturizing Lotion  Nivea Crme  Nivea Skin Firming Lotion  NutraDerm 30 Skin Lotion  NutraDerm Skin Lotion  NutraDerm Therapeutic Skin Cream  NutraDerm Therapeutic Skin Lotion  ProShield Protective Hand Cream  Provon moisturizing lotion

## 2024-01-29 NOTE — Progress Notes (Signed)
 Specialty Pharmacy Refill Coordination Note  Daniel Calderon is a 58 y.o. male contacted today regarding refills of specialty medication(s) Deucravacitinib  (Sotyktu )   Patient requested Delivery   Delivery date: 02/17/24   Verified address: [address]   Medication will be filled on 02.25.25.

## 2024-02-01 ENCOUNTER — Other Ambulatory Visit (HOSPITAL_COMMUNITY): Payer: Self-pay

## 2024-02-03 ENCOUNTER — Ambulatory Visit: Payer: Self-pay | Admitting: Anesthesiology

## 2024-02-03 ENCOUNTER — Ambulatory Visit: Payer: Self-pay | Admitting: Urgent Care

## 2024-02-03 ENCOUNTER — Encounter: Payer: Self-pay | Admitting: Neurosurgery

## 2024-02-03 ENCOUNTER — Other Ambulatory Visit: Payer: Self-pay

## 2024-02-03 ENCOUNTER — Encounter
Admission: RE | Disposition: A | Discharge status: Home / Self Care | Payer: Self-pay | Source: Ambulatory Visit | Attending: Neurosurgery

## 2024-02-03 ENCOUNTER — Encounter: Payer: Self-pay | Admitting: Family

## 2024-02-03 ENCOUNTER — Ambulatory Visit
Admission: RE | Admit: 2024-02-03 | Discharge: 2024-02-03 | Disposition: A | Discharge status: Home / Self Care | Payer: Commercial Managed Care - PPO | Source: Ambulatory Visit | Attending: Neurosurgery | Admitting: Neurosurgery

## 2024-02-03 ENCOUNTER — Ambulatory Visit: Payer: Commercial Managed Care - PPO

## 2024-02-03 DIAGNOSIS — R29898 Other symptoms and signs involving the musculoskeletal system: Secondary | ICD-10-CM

## 2024-02-03 DIAGNOSIS — Z01812 Encounter for preprocedural laboratory examination: Secondary | ICD-10-CM

## 2024-02-03 DIAGNOSIS — R531 Weakness: Secondary | ICD-10-CM | POA: Diagnosis not present

## 2024-02-03 DIAGNOSIS — M5412 Radiculopathy, cervical region: Secondary | ICD-10-CM

## 2024-02-03 DIAGNOSIS — K760 Fatty (change of) liver, not elsewhere classified: Secondary | ICD-10-CM | POA: Insufficient documentation

## 2024-02-03 DIAGNOSIS — Z419 Encounter for procedure for purposes other than remedying health state, unspecified: Secondary | ICD-10-CM

## 2024-02-03 DIAGNOSIS — M50223 Other cervical disc displacement at C6-C7 level: Secondary | ICD-10-CM | POA: Insufficient documentation

## 2024-02-03 DIAGNOSIS — Z01818 Encounter for other preprocedural examination: Secondary | ICD-10-CM

## 2024-02-03 DIAGNOSIS — I10 Essential (primary) hypertension: Secondary | ICD-10-CM

## 2024-02-03 HISTORY — PX: CERVICAL DISC ARTHROPLASTY: SHX587

## 2024-02-03 LAB — ABO/RH: ABO/RH(D): O POS

## 2024-02-03 SURGERY — CERVICAL ANTERIOR DISC ARTHROPLASTY
Anesthesia: General | Site: Spine Cervical

## 2024-02-03 MED ORDER — KETOROLAC TROMETHAMINE 30 MG/ML IJ SOLN
INTRAMUSCULAR | Status: DC | PRN
  Administered 2024-02-03: 30 mg via INTRAVENOUS

## 2024-02-03 MED ORDER — ONDANSETRON HCL 4 MG/2ML IJ SOLN
INTRAMUSCULAR | Status: DC | PRN
Start: 2024-02-03 — End: 2024-02-03
  Administered 2024-02-03: 4 mg via INTRAVENOUS

## 2024-02-03 MED ORDER — KETAMINE HCL 50 MG/5ML IJ SOSY
PREFILLED_SYRINGE | INTRAMUSCULAR | Status: AC
  Filled 2024-02-03: qty 5

## 2024-02-03 MED ORDER — KETAMINE HCL 50 MG/5ML IJ SOSY
PREFILLED_SYRINGE | INTRAMUSCULAR | Status: DC | PRN
  Administered 2024-02-03: 50 mg via INTRAVENOUS

## 2024-02-03 MED ORDER — 0.9 % SODIUM CHLORIDE (POUR BTL) OPTIME
TOPICAL | Status: DC | PRN
  Administered 2024-02-03: 500 mL

## 2024-02-03 MED ORDER — EPHEDRINE 5 MG/ML INJ
INTRAVENOUS | Status: AC
  Filled 2024-02-03: qty 5

## 2024-02-03 MED ORDER — SUCCINYLCHOLINE CHLORIDE 200 MG/10ML IV SOSY
PREFILLED_SYRINGE | INTRAVENOUS | Status: DC | PRN
  Administered 2024-02-03: 100 mg via INTRAVENOUS

## 2024-02-03 MED ORDER — ONDANSETRON HCL 4 MG/2ML IJ SOLN
INTRAMUSCULAR | Status: AC
  Filled 2024-02-03: qty 2

## 2024-02-03 MED ORDER — LIDOCAINE HCL (CARDIAC) PF 100 MG/5ML IV SOSY
PREFILLED_SYRINGE | INTRAVENOUS | Status: DC | PRN
Start: 2024-02-03 — End: 2024-02-03
  Administered 2024-02-03: 80 mg via INTRAVENOUS

## 2024-02-03 MED ORDER — ACETAMINOPHEN 10 MG/ML IV SOLN
INTRAVENOUS | Status: DC | PRN
  Administered 2024-02-03: 1000 mg via INTRAVENOUS

## 2024-02-03 MED ORDER — CEFAZOLIN SODIUM-DEXTROSE 2-4 GM/100ML-% IV SOLN
INTRAVENOUS | Status: AC
  Filled 2024-02-03: qty 100

## 2024-02-03 MED ORDER — HYDROMORPHONE HCL 1 MG/ML IJ SOLN
0.2500 mg | INTRAMUSCULAR | Status: DC | PRN
  Administered 2024-02-03 (×4): 0.25 mg via INTRAVENOUS

## 2024-02-03 MED ORDER — METHOCARBAMOL 500 MG PO TABS
500.0000 mg | ORAL_TABLET | Freq: Four times a day (QID) | ORAL | 0 refills | Status: DC | PRN
  Filled 2024-02-03: qty 120, 30d supply, fill #0

## 2024-02-03 MED ORDER — ACETAMINOPHEN 10 MG/ML IV SOLN
INTRAVENOUS | Status: AC
  Filled 2024-02-03: qty 100

## 2024-02-03 MED ORDER — MIDAZOLAM HCL 2 MG/2ML IJ SOLN
INTRAMUSCULAR | Status: AC
  Filled 2024-02-03: qty 2

## 2024-02-03 MED ORDER — SUCCINYLCHOLINE CHLORIDE 200 MG/10ML IV SOSY
PREFILLED_SYRINGE | INTRAVENOUS | Status: AC
Start: 2024-02-03 — End: ?
  Filled 2024-02-03: qty 10

## 2024-02-03 MED ORDER — FENTANYL CITRATE (PF) 100 MCG/2ML IJ SOLN
INTRAMUSCULAR | Status: DC | PRN
  Administered 2024-02-03 (×2): 50 ug via INTRAVENOUS

## 2024-02-03 MED ORDER — OXYCODONE HCL 5 MG/5ML PO SOLN: 5.0000 mg | Freq: Once | ORAL | Status: AC | PRN

## 2024-02-03 MED ORDER — HYDROMORPHONE HCL 1 MG/ML IJ SOLN
INTRAMUSCULAR | Status: AC
  Filled 2024-02-03: qty 1

## 2024-02-03 MED ORDER — OXYCODONE HCL 5 MG PO TABS
5.0000 mg | ORAL_TABLET | Freq: Once | ORAL | Status: AC | PRN
  Administered 2024-02-03: 5 mg via ORAL

## 2024-02-03 MED ORDER — HYDROCODONE-ACETAMINOPHEN 5-325 MG PO TABS
1.0000 | ORAL_TABLET | Freq: Four times a day (QID) | ORAL | 0 refills | Status: DC | PRN
  Filled 2024-02-03: qty 25, 7d supply, fill #0

## 2024-02-03 MED ORDER — BUPIVACAINE-EPINEPHRINE (PF) 0.5% -1:200000 IJ SOLN
INTRAMUSCULAR | Status: AC
  Filled 2024-02-03: qty 30

## 2024-02-03 MED ORDER — LACTATED RINGERS IV SOLN
INTRAVENOUS | Status: DC
Start: 2024-02-03 — End: 2024-02-03

## 2024-02-03 MED ORDER — FENTANYL CITRATE (PF) 100 MCG/2ML IJ SOLN
INTRAMUSCULAR | Status: AC
Start: 2024-02-03 — End: ?
  Filled 2024-02-03: qty 2

## 2024-02-03 MED ORDER — PROPOFOL 10 MG/ML IV BOLUS
INTRAVENOUS | Status: DC | PRN
  Administered 2024-02-03: 200 mg via INTRAVENOUS

## 2024-02-03 MED ORDER — PROPOFOL 10 MG/ML IV BOLUS
INTRAVENOUS | Status: AC
  Filled 2024-02-03: qty 20

## 2024-02-03 MED ORDER — ORAL CARE MOUTH RINSE: 15.0000 mL | Freq: Once | OROMUCOSAL | Status: DC

## 2024-02-03 MED ORDER — EPHEDRINE SULFATE-NACL 50-0.9 MG/10ML-% IV SOSY
PREFILLED_SYRINGE | INTRAVENOUS | Status: DC | PRN
  Administered 2024-02-03 (×3): 5 mg via INTRAVENOUS
  Administered 2024-02-03: 10 mg via INTRAVENOUS
  Administered 2024-02-03: 15 mg via INTRAVENOUS
  Administered 2024-02-03: 5 mg via INTRAVENOUS

## 2024-02-03 MED ORDER — DEXAMETHASONE SODIUM PHOSPHATE 10 MG/ML IJ SOLN
INTRAMUSCULAR | Status: DC | PRN
  Administered 2024-02-03: 5 mg via INTRAVENOUS

## 2024-02-03 MED ORDER — DEXAMETHASONE SODIUM PHOSPHATE 10 MG/ML IJ SOLN
INTRAMUSCULAR | Status: AC
  Filled 2024-02-03: qty 1

## 2024-02-03 MED ORDER — CHLORHEXIDINE GLUCONATE 0.12 % MT SOLN
OROMUCOSAL | Status: AC
  Filled 2024-02-03: qty 15

## 2024-02-03 MED ORDER — OXYCODONE HCL 5 MG PO TABS
ORAL_TABLET | ORAL | Status: AC
  Filled 2024-02-03: qty 1

## 2024-02-03 MED ORDER — ROCURONIUM BROMIDE 10 MG/ML (PF) SYRINGE
PREFILLED_SYRINGE | INTRAVENOUS | Status: AC
  Filled 2024-02-03: qty 10

## 2024-02-03 MED ORDER — BUPIVACAINE-EPINEPHRINE (PF) 0.5% -1:200000 IJ SOLN
INTRAMUSCULAR | Status: DC | PRN
Start: 2024-02-03 — End: 2024-02-03
  Administered 2024-02-03: 10 mL

## 2024-02-03 MED ORDER — CHLORHEXIDINE GLUCONATE 0.12 % MT SOLN: 15.0000 mL | Freq: Once | OROMUCOSAL | Status: DC

## 2024-02-03 MED ORDER — SENNA 8.6 MG PO TABS
1.0000 | ORAL_TABLET | Freq: Two times a day (BID) | ORAL | 0 refills | Status: DC | PRN
  Filled 2024-02-03: qty 30, 15d supply, fill #0

## 2024-02-03 MED ORDER — MIDAZOLAM HCL 2 MG/2ML IJ SOLN
INTRAMUSCULAR | Status: DC | PRN
  Administered 2024-02-03: 2 mg via INTRAVENOUS

## 2024-02-03 MED ORDER — LIDOCAINE HCL (PF) 2 % IJ SOLN
INTRAMUSCULAR | Status: AC
  Filled 2024-02-03: qty 5

## 2024-02-03 MED ORDER — ROCURONIUM BROMIDE 100 MG/10ML IV SOLN
INTRAVENOUS | Status: DC | PRN
Start: 2024-02-03 — End: 2024-02-03
  Administered 2024-02-03: 20 mg via INTRAVENOUS

## 2024-02-03 MED ORDER — CEFAZOLIN IN SODIUM CHLORIDE 2-0.9 GM/100ML-% IV SOLN
2.0000 g | Freq: Once | INTRAVENOUS | Status: AC
  Administered 2024-02-03: 2 g via INTRAVENOUS
  Filled 2024-02-03: qty 100

## 2024-02-03 MED ORDER — SURGIFLO WITH THROMBIN (HEMOSTATIC MATRIX KIT) OPTIME
TOPICAL | Status: DC | PRN
  Administered 2024-02-03: 1 via TOPICAL

## 2024-02-03 SURGICAL SUPPLY — 35 items
BUR NEURO DRILL SOFT 3.0X3.8M (BURR) ×1 IMPLANT
DERMABOND ADVANCED .7 DNX12 (GAUZE/BANDAGES/DRESSINGS) ×1 IMPLANT
DISC MOBI-C CERVICAL 15X17 H6 (Miscellaneous) IMPLANT
DRAPE C ARM PK CFD 31 SPINE (DRAPES) ×1 IMPLANT
DRAPE LAPAROTOMY 77X122 PED (DRAPES) ×1 IMPLANT
DRAPE MICROSCOPE SPINE 48X150 (DRAPES) ×1 IMPLANT
ELECT REM PT RETURN 9FT ADLT (ELECTROSURGICAL) ×1
ELECTRODE REM PT RTRN 9FT ADLT (ELECTROSURGICAL) ×1 IMPLANT
FEE INTRAOP CADWELL SUPPLY NCS (MISCELLANEOUS) IMPLANT
FEE INTRAOP MONITOR IMPULS NCS (MISCELLANEOUS) IMPLANT
GLOVE BIOGEL PI IND STRL 6.5 (GLOVE) ×1 IMPLANT
GLOVE SURG SYN 6.5 ES PF (GLOVE) ×1
GLOVE SURG SYN 6.5 PF PI (GLOVE) ×1 IMPLANT
GLOVE SURG SYN 8.5 E (GLOVE) ×3
GLOVE SURG SYN 8.5 PF PI (GLOVE) ×3 IMPLANT
GOWN SRG LRG LVL 4 IMPRV REINF (GOWNS) ×1 IMPLANT
GOWN SRG XL LVL 3 NONREINFORCE (GOWNS) ×1 IMPLANT
INTRAOP CADWELL SUPPLY FEE NCS (MISCELLANEOUS)
INTRAOP MONITOR FEE IMPULS NCS (MISCELLANEOUS)
KIT TURNOVER KIT A (KITS) ×1 IMPLANT
MANIFOLD NEPTUNE II (INSTRUMENTS) ×1 IMPLANT
MARKER SKIN DUAL TIP RULER LAB (MISCELLANEOUS) ×2 IMPLANT
NS IRRIG 500ML POUR BTL (IV SOLUTION) ×1 IMPLANT
PACK LAMINECTOMY ARMC (PACKS) ×1 IMPLANT
PAD ARMBOARD 7.5X6 YLW CONV (MISCELLANEOUS) ×2 IMPLANT
PIN CASPAR SPINAL 14MM CERVICA (PIN) IMPLANT
SPONGE KITTNER 5P (MISCELLANEOUS) ×1 IMPLANT
STAPLER SKIN PROX 35W (STAPLE) IMPLANT
SURGIFLO W/THROMBIN 8M KIT (HEMOSTASIS) ×1 IMPLANT
SUT STRATA 3-0 15 PS-2 (SUTURE) ×1 IMPLANT
SUT VICRYL 3-0 CR8 SH (SUTURE) ×1 IMPLANT
SYR 20ML LL LF (SYRINGE) ×1 IMPLANT
TAPE CLOTH 3X10 WHT NS LF (GAUZE/BANDAGES/DRESSINGS) ×2 IMPLANT
TRAP FLUID SMOKE EVACUATOR (MISCELLANEOUS) ×1 IMPLANT
TRAY FOLEY SLVR 16FR LF STAT (SET/KITS/TRAYS/PACK) IMPLANT

## 2024-02-03 NOTE — Anesthesia Postprocedure Evaluation (Signed)
Anesthesia Post Note  Patient: Daniel Calderon  Procedure(s) Performed: C6-7 ARTHROPLASTY (Spine Cervical)  Patient location during evaluation: PACU Anesthesia Type: General Level of consciousness: awake and alert Pain management: pain level controlled Vital Signs Assessment: post-procedure vital signs reviewed and stable Respiratory status: spontaneous breathing, nonlabored ventilation, respiratory function stable and patient connected to nasal cannula oxygen Cardiovascular status: blood pressure returned to baseline and stable Postop Assessment: no apparent nausea or vomiting Anesthetic complications: no   No notable events documented.   Last Vitals:  Vitals:   02/03/24 1123 02/03/24 1133  BP: 125/87 134/77  Pulse: 88 83  Resp: 14 18  Temp: (!) 36.2 C (!) 36.3 C  SpO2: 94% 93%    Last Pain:  Vitals:   02/03/24 1133  TempSrc: Temporal  PainSc: 0-No pain                 Louie Boston

## 2024-02-03 NOTE — Transfer of Care (Signed)
Immediate Anesthesia Transfer of Care Note  Patient: Daniel Calderon  Procedure(s) Performed: C6-7 ARTHROPLASTY (Spine Cervical)  Patient Location: PACU  Anesthesia Type:General  Level of Consciousness: sedated and unresponsive  Airway & Oxygen Therapy: Patient Spontanous Breathing and Patient connected to face mask oxygen  Post-op Assessment: Report given to RN and Post -op Vital signs reviewed and stable  Post vital signs: Reviewed and stable  Last Vitals:  Vitals Value Taken Time  BP 125/77 02/03/24 0923  Temp 36.4 C 02/03/24 0923  Pulse 81 02/03/24 0927  Resp 16 02/03/24 0927  SpO2 94 % 02/03/24 0927  Vitals shown include unfiled device data.  Last Pain:  Vitals:   02/03/24 0629  TempSrc: Temporal  PainSc: 0-No pain      Patients Stated Pain Goal: 0 (02/03/24 0629)  Complications: No notable events documented.

## 2024-02-03 NOTE — Discharge Summary (Signed)
Discharge Summary  Patient ID: Daniel Calderon MRN: [id number] DOB/AGE: 1966-11-04 58 y.o.  Admit date: 02/03/2024 Discharge date: 02/03/2024  Admission Diagnoses: M54.12 cervical radiculopathy, R29.898 right arm weakness  Discharge Diagnoses:  Active Problems:   * No active hospital problems. *   Discharged Condition: good  Hospital Course:  Daniel Calderon is a 58 y.o presenting with cervical radiculopathy and right arm weakness status post C6-7 arthroplasty.  His intraoperative course was uncomplicated.  He was monitored in PACU for 4 hours and discharged home after ambulating, urinating, and tolerating p.o. intake.  He is given prescriptions for muscle relaxer, stool softener, and pain medication to take as needed.  Consults: None  Significant Diagnostic Studies: NA  Treatments: surgery: Above.  Please see separately dictated operative report for further details.  Discharge Exam: Blood pressure (!) 134/93, pulse 70, temperature 98.2 F (36.8 C), temperature source Temporal, resp. rate 14, height 6\' 2"  (1.88 m), weight 98 kg, SpO2 98%. CN grossly intact MAEW  Incision c/d/I with dermabond in place  Disposition: Discharge disposition: 01-Home or Self Care        Allergies as of 02/03/2024       Reactions   Erythromycin Nausea And Vomiting   Dexamethasone Nausea And Vomiting   Keflex [cephalexin] Other (See Comments)   Turned Pt tanned or "orange"   Percocet [oxycodone-acetaminophen] Other (See Comments)   Very sensitive - Makes Pt very sedated    Prednisone Other (See Comments)   Makes pt "upset" or "irritated"   Gabapentin Other (See Comments)   Aggressive        Medication List     TAKE these medications    amLODipine 10 MG tablet Commonly known as: NORVASC Take 1 tablet (10 mg total) by mouth daily.   cyanocobalamin 1000 MCG tablet Commonly known as: VITAMIN B12 Take 1,000 mcg by mouth daily.   HYDROcodone-acetaminophen 5-325 MG  tablet Commonly known as: NORCO/VICODIN Take 1 tablet by mouth every 6 (six) hours as needed for severe pain (pain score 7-10).   meloxicam 15 MG tablet Commonly known as: MOBIC Take 1 tablet (15 mg total) by mouth daily.   methocarbamol 500 MG tablet Commonly known as: ROBAXIN Take 1 tablet (500 mg total) by mouth every 6 (six) hours as needed for muscle spasms.   omeprazole 20 MG capsule Commonly known as: PRILOSEC Take 1 capsule (20 mg total) by mouth daily.   pregabalin 50 MG capsule Commonly known as: Lyrica Take 1 capsule (50 mg total) by mouth 2 (two) times daily.   senna 8.6 MG Tabs tablet Commonly known as: SENOKOT Take 1 tablet (8.6 mg total) by mouth 2 (two) times daily as needed for mild constipation.   sertraline 100 MG tablet Commonly known as: ZOLOFT Take 1 tablet (100 mg total) by mouth daily.   Sotyktu 6 MG Tabs Generic drug: Deucravacitinib Take one tablet by mouth once daily.        Follow-up Information     Susanne Borders, PA Follow up on 02/16/2024.   Specialty: Neurosurgery Contact information: [address] [place] [address] [phone]                 Signed: Susanne Borders 02/03/2024, 9:18 AM

## 2024-02-03 NOTE — Progress Notes (Signed)
Pt tolerating PO drink and food.

## 2024-02-03 NOTE — Anesthesia Preprocedure Evaluation (Signed)
Anesthesia Evaluation  Patient identified by MRN, date of birth, ID band Patient awake    Reviewed: Allergy & Precautions, NPO status , Patient's Chart, lab work & pertinent test results  History of Anesthesia Complications Negative for: history of anesthetic complications  Airway Mallampati: IV  TM Distance: >3 FB Neck ROM: full    Dental no notable dental hx.    Pulmonary sleep apnea and Continuous Positive Airway Pressure Ventilation    Pulmonary exam normal        Cardiovascular hypertension, On Medications negative cardio ROS Normal cardiovascular exam     Neuro/Psych  Headaches PSYCHIATRIC DISORDERS Anxiety Depression     Neuromuscular disease    GI/Hepatic Neg liver ROS,GERD  Medicated,,  Endo/Other  negative endocrine ROS    Renal/GU negative Renal ROS     Musculoskeletal   Abdominal   Peds  Hematology negative hematology ROS (+)   Anesthesia Other Findings Past Medical History: No date: Allergy     Comment:  Seasonal No date: Anxiety No date: Arthritis No date: Asthma No date: Benign enlargement of prostate No date: Colon polyps No date: Complex sleep apnea syndrome No date: Depression No date: Elevated BP No date: Elevated transaminase level No date: Failure of erection No date: Fatigue No date: Fatty liver No date: Frequent headaches No date: GERD (gastroesophageal reflux disease) No date: History of kidney stones No date: Hyperlipidemia No date: Hypertension No date: Hypogonadism in male No date: Migraine No date: NAFL (nonalcoholic fatty liver) No date: Obstructive sleep apnea treated with BiPAP No date: Psoriasis 09/01/2017: Sinus congestion No date: Splenomegaly No date: Thoracic outlet syndrome     Comment:  Left Arm 02/09/2019: Tongue pain  Past Surgical History: 11/11/2017: COLONOSCOPY WITH PROPOFOL; N/A     Comment:  Procedure: COLONOSCOPY WITH PROPOFOL;  Surgeon: Toney Reil, MD;  Location: [facility];                Service: Endoscopy;  Laterality: N/A; 10/14/2022: COLONOSCOPY WITH PROPOFOL; N/A     Comment:  Procedure: COLONOSCOPY WITH PROPOFOL;  Surgeon: Toney Reil, MD;  Location: [facility];                Service: Endoscopy;  Laterality: N/A;  sleep apnea 08/25/2019: ESOPHAGOGASTRODUODENOSCOPY (EGD) WITH PROPOFOL; N/A     Comment:  Procedure: ESOPHAGOGASTRODUODENOSCOPY (EGD) WITH               PROPOFOL;  Surgeon: Toney Reil, MD;  Location:               [facility];  Service: Gastroenterology;  Laterality:               N/A; 07/26/2018: KNEE ARTHROSCOPY WITH MEDIAL MENISECTOMY; Left     Comment:  Procedure: KNEE ARTHROSCOPY WITH PARTIAL MEDIAL               MENISECTOMY;  Surgeon: Signa Kell, MD;  Location: [facility]               ORS;  Service: Orthopedics;  Laterality: Left; No date: NASAL SEPTUM SURGERY No date: nerve block     Comment:  2 in neck. 5 in back. 11/11/2017: POLYPECTOMY     Comment:  Procedure: POLYPECTOMY;  Surgeon: Toney Reil,  MD;  Location: [facility];  Service: Endoscopy;; No date: SCALENE NODE BIOPSY / EXCISION 2011: THORACIC OUTLET SURGERY; Left No date: VASECTOMY  BMI    Body Mass Index: 27.73 kg/m      Reproductive/Obstetrics negative OB ROS                             Anesthesia Physical Anesthesia Plan  ASA: 2  Anesthesia Plan: General ETT   Post-op Pain Management: Toradol IV (intra-op)*, Ofirmev IV (intra-op)*, Dilaudid IV and Ketamine IV*   Induction: Intravenous  PONV Risk Score and Plan: 2 and Ondansetron, Dexamethasone, Midazolam and Treatment may vary due to age or medical condition  Airway Management Planned: Oral ETT  Additional Equipment:   Intra-op Plan:   Post-operative Plan: Extubation in OR  Informed Consent: I have reviewed the patients History and Physical,  chart, labs and discussed the procedure including the risks, benefits and alternatives for the proposed anesthesia with the patient or authorized representative who has indicated his/her understanding and acceptance.     Dental Advisory Given  Plan Discussed with: Anesthesiologist, CRNA and Surgeon  Anesthesia Plan Comments: (Patient consented for risks of anesthesia including but not limited to:  - adverse reactions to medications - damage to eyes, teeth, lips or other oral mucosa - nerve damage due to positioning  - sore throat or hoarseness - Damage to heart, brain, nerves, lungs, other parts of body or loss of life  Patient voiced understanding and assent.)        Anesthesia Quick Evaluation

## 2024-02-03 NOTE — Anesthesia Procedure Notes (Signed)
Procedure Name: Intubation Date/Time: 02/03/2024 7:29 AM  Performed by: Otho Perl, CRNAPre-anesthesia Checklist: Patient identified, Patient being monitored, Timeout performed, Emergency Drugs available and Suction available Patient Re-evaluated:Patient Re-evaluated prior to induction Oxygen Delivery Method: Circle system utilized Preoxygenation: Pre-oxygenation with 100% oxygen Induction Type: IV induction Ventilation: Mask ventilation without difficulty Laryngoscope Size: 3 and McGrath Grade View: Grade I Tube type: Oral Tube size: 7.5 mm Number of attempts: 1 Airway Equipment and Method: Stylet Placement Confirmation: ETT inserted through vocal cords under direct vision, positive ETCO2 and breath sounds checked- equal and bilateral Secured at: 23 cm Tube secured with: Tape Dental Injury: Teeth and Oropharynx as per pre-operative assessment  Comments: Large glottic opening noted

## 2024-02-03 NOTE — Op Note (Signed)
Indications: Daniel Calderon is a 58 y.o. male with cervical radiculopathy M54.12 and arm weakness.  Findings: large disc herniation  Preoperative Diagnosis: M54.12 cervical radiculopathy, R29.898 right arm weakness  Postoperative Diagnosis: same   EBL: 25 ml IVF: see AR Drains: none Disposition: Extubated and Stable to PACU Complications: none  No foley catheter was placed.   Preoperative Note:   Risks of surgery discussed include: infection, bleeding, stroke, coma, death, paralysis, CSF leak, nerve/spinal cord injury, numbness, tingling, weakness, complex regional pain syndrome, recurrent stenosis and/or disc herniation, vascular injury, development of instability, neck/back pain, need for further surgery, persistent symptoms, development of deformity, and the risks of anesthesia. The patient understood these risks and agreed to proceed.  Operative Note:  Procedure:  1) Cervical Disc Arthroplasty at C6/7 using a LDR Mobi-C device   Procedure: After obtaining informed consent, the patient taken to the operating room, placed in supine position, general anesthesia induced.  The patient had a small shoulder roll placed behind the neck.  The patient received preop antibiotics and IV Decadron.  The patient had a neck incision outlined, was prepped and draped in usual sterile fashion. The incision was injected with local anesthetic.   An incision was opened, dissection taken down medial to the carotid artery and jugular vein, lateral to the trachea and esophagus.  The prevertebral fascia identified and a localizing x-ray demonstrated the correct level.  The longus colli were dissected laterally, and self-retaining retractors placed to open the operative field. The microscope was then brought into the field.  With this complete, distractor pins were placed in the vertebral bodies of C6 and C7. The distractor was placed, and the annulus at C6/7 was opened using a bovie.  Curettes and  pituitary rongeurs used to remove the majority of disk, then the drill was used to remove the posterior osteophyte and begin the foraminotomies. The nerve hook was used to elevate the posterior longitudinal ligament, which was then removed with Kerrison rongeurs. The Kerrison was then used to complete the foraminotomies bilaterally. The microblunt nerve hook could be passed out the foramen bilaterally.   Meticulous hemostasis was obtained.  A trial spacer was used to size the disc space. Using flouroscopic guidance, a 17 mm width x 15 mm depth x 6 mm height Mobi-C was then inserted in the prepared disc space.  The caspar distractor was removed, and bone wax used for hemostasis. Final AP and lateral radiographs were taken.   With the disc arthroplasty in good position, the wound was irrigated copiously and meticulous hemostasis obtained.  Wound was closed in 2 layers using interrupted inverted 3-0 Vicryl sutures.  The wound was dressed with dermabond, the head of bed at 30 degrees, taken to recovery room in stable condition.  No new postop neurological deficits were identified.  Sponge and pattie counts were correct at the end of the procedure.     I performed the entire procedure with the assistance of Manning Charity PA as an Designer, television/film set. An assistant was required for this procedure due to the complexity.  The assistant provided assistance in tissue manipulation and suction, and was required for the successful and safe performance of the procedure. I performed the critical portions of the procedure.   Venetia Night MD

## 2024-02-03 NOTE — Interval H&P Note (Signed)
History and Physical Interval Note:  02/03/2024 6:58 AM  Daniel Calderon  has presented today for surgery, with the diagnosis of M54.12 cervical radiculopathy R29.898 right arm weakness.  The various methods of treatment have been discussed with the patient and family. After consideration of risks, benefits and other options for treatment, the patient has consented to  Procedure(s): C6-7 ARTHROPLASTY (N/A) as a surgical intervention.  The patient's history has been reviewed, patient examined, no change in status, stable for surgery.  I have reviewed the patient's chart and labs.  Questions were answered to the patient's satisfaction.    Heart sounds normal no MRG. Chest Clear to Auscultation Bilaterally.   Daniel Calderon

## 2024-02-03 NOTE — Discharge Instructions (Addendum)
Your surgeon has performed an operation on your cervical spine (neck) to relieve pressure on the spinal cord and/or nerves. This involved making an incision in the front of your neck and removing one or more of the discs that support your spine. Next, space is placed into the disc space.   The following are instructions to help in your recovery once you have been discharged from the hospital. Even if you feel well, it is important that you follow these activity guidelines. If you do not let your neck heal properly from the surgery, you can increase the chance of return of your symptoms and other complications.   Activity    No bending, lifting, or twisting ("BLT"). Avoid lifting objects heavier than 10 pounds (gallon milk jug).  Where possible, avoid household activities that involve lifting, bending, reaching, pushing, or pulling such as laundry, vacuuming, grocery shopping, and childcare. Try to arrange for help from friends and family for these activities while your back heals.  Increase physical activity slowly as tolerated.  Taking short walks is encouraged, but avoid strenuous exercise. Do not jog, run, bicycle, lift weights, or participate in any other exercises unless specifically allowed by your doctor.  Talk to your doctor before resuming sexual activity.  You should not drive until cleared by your doctor.  Until released by your doctor, you should not return to work or school.  You should rest at home and let your body heal.   You may shower three days after your surgery.  After showering, lightly dab your incision dry. Do not take a tub bath or go swimming until approved by your doctor at your follow-up appointment.  If your doctor ordered a cervical collar (neck brace) for you, you should wear it whenever you are out of bed. You may remove it when lying down or sleeping, but you should wear it at all other times. Not all neck surgeries require a cervical collar.  If you smoke, we  strongly recommend that you quit.  Smoking has been proven to interfere with normal bone healing and will dramatically reduce the success rate of your surgery. Please contact [facility] (800-QUIT-NOW) and use the resources at www.[facility].com for assistance in stopping smoking.  Surgical Incision   If you have a dressing on your incision, you may remove it two days after your surgery. Keep your incision area clean and dry.  Diet           You may return to your usual diet. However, you may experience discomfort when swallowing in the first month after your surgery. This is normal. You may find that softer foods are more comfortable for you to swallow. Be sure to stay hydrated.  When to Contact us  You may experience pain in your neck and/or pain between your shoulder blades. This is normal and should improve in the next few weeks with the help of pain medication, muscle relaxers, and rest. Some patients report that a warm compress on the back of the neck or between the shoulder blades helps.  However, should you experience any of the following, contact us immediately: New numbness or weakness Pain that is progressively getting worse, and is not relieved by your pain medication, muscle relaxers, rest, and warm compresses Bleeding, redness, swelling, pain, or drainage from surgical incision Chills or flu-like symptoms Fever greater than 101.0 F (38.3 C) Inability to eat, drink fluids, or take medications Problems with bowel or bladder functions Difficulty breathing or shortness of breath Warmth, tenderness,  or swelling in your calf Contact Information How to contact us:  If you have any questions/concerns before or after surgery, you can reach [name] at [phone], or you can send a mychart message. We can be reached by phone or mychart 8am-4pm, Monday-Friday.  *Please note: Calls after 4pm are forwarded to a third party answering service. Mychart messages are not routinely monitored during  evenings, weekends, and holidays. Please call our office to contact the answering service for urgent concerns during non-business hours.

## 2024-02-04 ENCOUNTER — Encounter: Payer: Self-pay | Admitting: Neurosurgery

## 2024-02-15 ENCOUNTER — Ambulatory Visit: Payer: 59 | Admitting: Dermatology

## 2024-02-16 ENCOUNTER — Encounter: Payer: Self-pay | Admitting: Neurosurgery

## 2024-02-16 ENCOUNTER — Other Ambulatory Visit: Payer: Self-pay

## 2024-02-16 ENCOUNTER — Ambulatory Visit (INDEPENDENT_AMBULATORY_CARE_PROVIDER_SITE_OTHER): Payer: Commercial Managed Care - PPO | Admitting: Neurosurgery

## 2024-02-16 VITALS — BP 112/80 | Ht 74.0 in | Wt 216.0 lb

## 2024-02-16 DIAGNOSIS — R29898 Other symptoms and signs involving the musculoskeletal system: Secondary | ICD-10-CM

## 2024-02-16 DIAGNOSIS — M5412 Radiculopathy, cervical region: Secondary | ICD-10-CM

## 2024-02-16 MED ORDER — PREGABALIN 50 MG PO CAPS
50.0000 mg | ORAL_CAPSULE | Freq: Every morning | ORAL | 0 refills | Status: DC
  Filled 2024-02-16: qty 30, 30d supply, fill #0

## 2024-02-16 MED ORDER — PREGABALIN 75 MG PO CAPS
75.0000 mg | ORAL_CAPSULE | Freq: Every evening | ORAL | 0 refills | Status: DC
  Filled 2024-02-16: qty 30, 30d supply, fill #0

## 2024-02-16 NOTE — Progress Notes (Signed)
   REFERRING PHYSICIAN:  Allegra Grana, Fnp [address] [place],  [place] [address]  DOS: 02/03/24 C6-7 arthroplasty  HISTORY OF PRESENT ILLNESS: Daniel Calderon is about 2 weeks status post cervical arthroplasty. Overall, he is doing relatively well after his recent surgery.  He reports significant improvement of his right rating arm pain however he is having some more discomfort into his left scalp and left chest similar to the symptoms he had prior to his scalene surgery.  He is also having some hoarseness but is largely tolerating normal diet.  He is eager to start physical therapy as he still having some weakness particularly in his right tricep.  He is not currently taking pain medication.  PHYSICAL EXAMINATION:  NEUROLOGICAL:  General: In no acute distress.   Awake, alert, oriented to person, place, and time.  Pupils equal round and reactive to light.  Facial tone is symmetric.  Tongue protrusion is midline.  There is no pronator drift.   Strength: Side Biceps Triceps Deltoid Interossei Grip Wrist Ext. Wrist Flex.  R 5 4- 5 5 4 5 5   L 5 5 5 5 5 5 5     Incision c/d/I and healing well  Imaging:  No interval imaging to review  Assessment / Plan: Daniel Calderon is doing relatively well after recent cervical arthroplasty.  Given his recurrent left symptoms he is requested a increase in his Lyrica.  Increase his nighttime dose to 75 mg and encouraged him to continue with his 50 mg morning dose.  We will uptitrate if needed however I am hopeful that he will only need an increase dose for short period of time.  He is requesting referral to physical therapy and would like to go to the outpatient [facility] facility and [facility].  I have placed a referral for this.  We discussed activity escalation and I have advised the patient to lift up to 10 pounds until 6 weeks after surgery, then increase up to 25 pounds until 12 weeks after surgery.  After 12 weeks post-op, the patient  advised to increase activity as tolerated. he will return to clinic in approximately 4 weeks to see Dr. Myer Haff or sooner should he have any questions or concerns.   Advised to contact the office if any questions or concerns arise.   Manning Charity PA-C Dept of Neurosurgery

## 2024-02-17 ENCOUNTER — Ambulatory Visit: Payer: Commercial Managed Care - PPO | Attending: Neurosurgery | Admitting: Physical Therapy

## 2024-02-17 DIAGNOSIS — M5412 Radiculopathy, cervical region: Secondary | ICD-10-CM | POA: Insufficient documentation

## 2024-02-17 DIAGNOSIS — M542 Cervicalgia: Secondary | ICD-10-CM

## 2024-02-17 DIAGNOSIS — M6281 Muscle weakness (generalized): Secondary | ICD-10-CM | POA: Diagnosis not present

## 2024-02-17 DIAGNOSIS — R29898 Other symptoms and signs involving the musculoskeletal system: Secondary | ICD-10-CM | POA: Insufficient documentation

## 2024-02-17 NOTE — Patient Instructions (Signed)
 Marland Kitchen

## 2024-02-17 NOTE — Therapy (Signed)
 OUTPATIENT PHYSICAL THERAPY CERVICAL EVALUATION  Patient Name: Daniel Calderon MRN: [id number] DOB:Oct 29, 1966, 58 y.o., male Today's Date: 02/19/2024  END OF SESSION:  PT End of Session - 02/19/24 1946     Visit Number 1    Number of Visits 8    Date for PT Re-Evaluation 03/16/24    PT Start Time 1256    PT Stop Time 1347    PT Time Calculation (min) 51 min    Activity Tolerance --    Behavior During Therapy --         Past Medical History:  Diagnosis Date   Allergy    Seasonal   Anxiety    Arthritis    Asthma    Benign enlargement of prostate    Colon polyps    Complex sleep apnea syndrome    Depression    Elevated BP    Elevated transaminase level    Failure of erection    Fatigue    Fatty liver    Frequent headaches    GERD (gastroesophageal reflux disease)    History of kidney stones    Hyperlipidemia    Hypertension    Hypogonadism in male    Migraine    NAFL (nonalcoholic fatty liver)    Obstructive sleep apnea treated with BiPAP    Psoriasis    Sinus congestion 09/01/2017   Splenomegaly    Thoracic outlet syndrome    Left Arm   Tongue pain 02/09/2019   Past Surgical History:  Procedure Laterality Date   CERVICAL DISC ARTHROPLASTY N/A 02/03/2024   Procedure: C6-7 ARTHROPLASTY;  Surgeon: Venetia Night, MD;  Location: [facility] OR[facility];  Service: Neurosurgery;  Laterality: N/A;   COLONOSCOPY WITH PROPOFOL N/A 11/11/2017   Procedure: COLONOSCOPY WITH PROPOFOL;  Surgeon: Toney Reil, MD;  Location: [facility];  Service: Endoscopy;  Laterality: N/A;   COLONOSCOPY WITH PROPOFOL N/A 10/14/2022   Procedure: COLONOSCOPY WITH PROPOFOL;  Surgeon: Toney Reil, MD;  Location: [facility];  Service: Endoscopy;  Laterality: N/A;  sleep apnea   ESOPHAGOGASTRODUODENOSCOPY (EGD) WITH PROPOFOL N/A 08/25/2019   Procedure: ESOPHAGOGASTRODUODENOSCOPY (EGD) WITH PROPOFOL;  Surgeon: Toney Reil, MD;  Location: [facility] END[facility];  Service:  Gastroenterology;  Laterality: N/A;   KNEE ARTHROSCOPY WITH MEDIAL MENISECTOMY Left 07/26/2018   Procedure: KNEE ARTHROSCOPY WITH PARTIAL MEDIAL MENISECTOMY;  Surgeon: Signa Kell, MD;  Location: [facility];  Service: Orthopedics;  Laterality: Left;   NASAL SEPTUM SURGERY     nerve block     2 in neck. 5 in back.   POLYPECTOMY  11/11/2017   Procedure: POLYPECTOMY;  Surgeon: Toney Reil, MD;  Location: [facility];  Service: Endoscopy;;   SCALENE NODE BIOPSY / EXCISION     THORACIC OUTLET SURGERY Left 2011   VASECTOMY     Patient Active Problem List   Diagnosis Date Noted   Right arm weakness 02/03/2024   Cervical radiculitis 02/03/2024   Hepatic steatosis 02/03/2024   Abnormal chest CT 01/08/2024   Memory impairment 11/02/2023   Anxiety 11/02/2023   Family history of ASCVD 11/02/2023   GERD (gastroesophageal reflux disease) 11/02/2023   History of colonic polyps    Family history of colon cancer    Encounter for general adult medical examination with abnormal findings 10/06/2022   Splenomegaly 10/03/2022   Left groin pain 09/30/2022   Asthma 07/21/2022   Nausea 04/08/2022   Tremor 04/08/2022   ASIS pain 04/08/2022   Left sided numbness 02/13/2020  Hyperlipidemia 12/07/2019   Radiculopathy, cervical and lumbar 12/06/2019   Abdominal pain, epigastric    Left flank pain 08/15/2019   Elevated LFTs 08/10/2019   Hypogammaglobulinemia (HCC) 05/17/2019   Excessive daytime sleepiness 03/28/2019   Cataplexy 03/28/2019   OSA on CPAP 03/28/2019   Acute pain of left knee 04/18/2018   Cutaneous skin tags 04/18/2018   Lower extremity edema 04/18/2018   Thoracic outlet syndrome 02/19/2018   Pain in limb 02/19/2018   Muscle strain 09/01/2017   Chest pain 03/21/2017   Dyspnea on exertion 02/19/2017   Language difficulty 02/19/2017   BPH with obstruction/lower urinary tract symptoms 12/03/2015   Medication refill 09/13/2015   Erectile disorder, generalized, mild  07/26/2015   Hypogonadism in male 05/24/2015   Environmental allergies 04/13/2015   Sleep apnea 04/13/2015   Migraines 04/13/2015   Neck pain 04/13/2015   Adjustment disorder with mixed anxiety and depressed mood 04/13/2015   Psoriasis 04/13/2015   History of kidney stones 04/13/2015   Neuropathy 04/13/2015   Benign fibroma of prostate 03/24/2015   Hypertension 03/24/2015   Elevation of level of transaminase or lactic acid dehydrogenase (LDH) 03/24/2015   Fatigue 03/24/2015    PCP: Allegra Grana, FNP  REFERRING PROVIDER: Susanne Borders, PA  REFERRING DIAG:  [phone] (ICD-10-CM) - Cervical radiculopathy  R29.898 (ICD-10-CM) - Right arm weakness   THERAPY DIAG:  Cervicalgia  Muscle weakness of right arm  Rationale for Evaluation and Treatment: Rehabilitation  ONSET DATE: 02/03/24  SUBJECTIVE:                                                                                                                                                                                                         SUBJECTIVE STATEMENT: Pt. Reports chronic h/o R UT/ shoulder discomfort.  Pt. Was golfing in [place] in January 2025 and reports increase R forearm/hand numbness. S/p C6-7 arthroplasty on 02/03/24 Hand dominance: Right  PERTINENT HISTORY:  H/o L scalene surgery in 2011 (see MD notes).  Uses BiPAP and sleeps on back.  Enjoys shooting/ golfing.  RTW: 05/02/24 (no restrictions in order to RTW).    PAIN:  Are you having pain? No  PRECAUTIONS: Other: no bending, lifting or twisting  RED FLAGS: None    WEIGHT BEARING RESTRICTIONS: No  FALLS:  Has patient fallen in last 6 months? No  LIVING ENVIRONMENT: Lives with: lives with their spouse Lives in: House/apartment Stairs: Yes: External: 3 steps; on right going up Has following equipment at home: None  OCCUPATION: Network engineer at [employer]  PLOF: Independent  PATIENT GOALS: Return to work/ shooting with no  limitations/ pain.  Increase UE muscle strength  NEXT MD VISIT: PRN  OBJECTIVE:  Note: Objective measures were completed at Evaluation unless otherwise noted.  DIAGNOSTIC FINDINGS:  See MD notes/ imaging before and after surgery.    PATIENT SURVEYS:  NDI 44% self-perceived moderate disability.  COGNITION: Overall cognitive status: Within functional limits for tasks assessed  SENSATION: C2 positive on L  C5-6 hyper reflex. On R  POSTURE: rounded shoulders and forward head Pt. Discussed posture bracing to correct.  PALPATION: Minimal tenderness along anterior cervical incision.  Good incision healing with skin glue in place.     CERVICAL ROM:   Active ROM A/PROM (deg) eval  Flexion 23 deg.  Extension 19 deg.  Right lateral flexion 19 deg.  Left lateral flexion 12 deg.  Right rotation 47 deg.  Left rotation 45 deg.   (Blank rows = not tested)  UPPER EXTREMITY ROM:  Active ROM Right eval Left eval  Shoulder flexion WNL WNL  Shoulder extension    Shoulder abduction    Shoulder adduction    Shoulder extension    Shoulder internal rotation 80 deg. 70 deg.  Shoulder external rotation 90 deg. 90 deg.  Elbow flexion    Elbow extension    Wrist flexion    Wrist extension    Wrist ulnar deviation    Wrist radial deviation    Wrist pronation    Wrist supination     (Blank rows = not tested)  UPPER EXTREMITY MMT:  MMT Right eval Left eval  Shoulder flexion 5 4  Shoulder extension    Shoulder abduction 5 5  Shoulder adduction 4 (concordant pain) 4+      Shoulder internal rotation 5 5  Shoulder external rotation 4 4  Middle trapezius    Lower trapezius    Elbow flexion 4 5  Elbow extension 3+ 4  Wrist flexion    Wrist extension 4 4  Wrist ulnar deviation    Wrist radial deviation    Wrist pronation    Wrist supination    Grip strength 99.8 deg. 96.2#   (Blank rows = not tested)  CERVICAL SPECIAL TESTS:  N/T  FUNCTIONAL TESTS:   Deferred  TREATMENT DATE: 02/19/2024                                                                                                                              Evaluation/ See HEP  PATIENT EDUCATION:  Education details: posture correction/ lifting restrictions/ see HEP Person educated: Patient Education method: Explanation, Demonstration, and Handouts Education comprehension: verbalized understanding and returned demonstration  HOME EXERCISE PROGRAM: Access Code: [facility] URL: [email or web address] Date: 02/17/2024 Prepared by: Dorene Grebe  Exercises - Seated Shoulder Press Ups with Armchair  - 2 x daily - 7 x weekly - 1 sets - 5 reps - Scapular Stabilization - Full Arm Support  - 2 x daily - 7 x  weekly - 1 sets - 5 reps - Doorway Pec Stretch at 90 Degrees Abduction  - 2 x daily - 7 x weekly - 1 sets - 5 reps - Standing Lean Away Doorway Stretch  - 2 x daily - 7 x weekly - 1 sets - 5 reps  ASSESSMENT:  CLINICAL IMPRESSION: Patient is a pleasant 58 y.o. male who was seen today for physical therapy evaluation and treatment for cervical pain/ UE muscle weakness s/p C6-7 arthroplasty.  Pt. Reports no pain currently at rest and presents with slight forward head, rounded shoulder posture.  Pt. Presents with limited cervical AROM (see above) and B UE muscle weakness.  Pt. Instructed to avoid any bending, lifting or twisting at this time per MD protocol.  Pt. Hoping to return to work on 05/02/24 with no limitations.     OBJECTIVE IMPAIRMENTS: decreased mobility, decreased ROM, decreased strength, hypomobility, impaired flexibility, improper body mechanics, postural dysfunction, and pain.   ACTIVITY LIMITATIONS: carrying, lifting, sleeping, locomotion level, and caring for others  PARTICIPATION LIMITATIONS: cleaning, driving, shopping, community activity, occupation, and yard work  PERSONAL FACTORS: Fitness and Past/current experiences are also affecting patient's  functional outcome.   REHAB POTENTIAL: Good  CLINICAL DECISION MAKING: Stable/uncomplicated  EVALUATION COMPLEXITY: Low   GOALS: Goals reviewed with patient? Yes  SHORT TERM GOALS: Target date: 03/02/24  Pt. Independent with HEP to increase cervical AROM to [facility] and upright posture to improve pain-free mobility.   Baseline: pt. Issued gentle stretches.  Goal status: INITIAL   LONG TERM GOALS: Target date: 03/16/24  Pt. Will decrease NDI to <20% to improve pain-free functional mobility.   Baseline: 44% self-perceived moderate disability.   Goal status: INITIAL  2.  Pt. Independent with UE strengthening ex. Program to increase B UE to 5/5 MMT to improve return to work.   Baseline: no UE strengthening at this time Goal status: INITIAL  3.  Pt. Able to return to shooting/ household chores with no cervical pain or limitations.   Baseline: currently not able Goal status: INITIAL  PLAN:  PT FREQUENCY: 1-2x/week  PT DURATION: 4 weeks  PLANNED INTERVENTIONS: 97110-Therapeutic exercises, 97530- Therapeutic activity, [id number]- Neuromuscular re-education, 97535- Self Care, [id number]- Manual therapy, [id number]- Electrical stimulation (unattended), Patient/Family education, Joint mobilization, Cryotherapy, and Moist heat  PLAN FOR NEXT SESSION: Reassess HEP/ gentle manual cervical stretches  Cammie Mcgee, PT, DPT # [phone] 02/19/2024, 7:48 PM

## 2024-02-19 ENCOUNTER — Other Ambulatory Visit: Payer: Self-pay

## 2024-02-22 ENCOUNTER — Ambulatory Visit: Payer: Commercial Managed Care - PPO | Attending: Neurosurgery | Admitting: Physical Therapy

## 2024-02-22 DIAGNOSIS — M542 Cervicalgia: Secondary | ICD-10-CM | POA: Diagnosis not present

## 2024-02-22 DIAGNOSIS — M6281 Muscle weakness (generalized): Secondary | ICD-10-CM | POA: Diagnosis not present

## 2024-02-24 ENCOUNTER — Encounter: Payer: Self-pay | Admitting: Physical Therapy

## 2024-02-24 ENCOUNTER — Ambulatory Visit: Payer: Commercial Managed Care - PPO | Admitting: Physical Therapy

## 2024-02-24 DIAGNOSIS — M6281 Muscle weakness (generalized): Secondary | ICD-10-CM

## 2024-02-24 DIAGNOSIS — M542 Cervicalgia: Secondary | ICD-10-CM | POA: Diagnosis not present

## 2024-02-24 NOTE — Therapy (Signed)
 OUTPATIENT PHYSICAL THERAPY CERVICAL TREATMENT  Patient Name: [name] MRN: [id number] DOB:Jun 19, 1966, 58 y.o., male Today's Date: 02/24/2024  END OF SESSION:  PT End of Session - 02/23/24 1145     Visit Number 2    Number of Visits 8    Date for PT Re-Evaluation 03/16/24    PT Start Time 1253    PT Stop Time 1340    PT Time Calculation (min) 47 min    Activity Tolerance --    Behavior During Therapy --         Past Medical History:  Diagnosis Date   Allergy    Seasonal   Anxiety    Arthritis    Asthma    Benign enlargement of prostate    Colon polyps    Complex sleep apnea syndrome    Depression    Elevated BP    Elevated transaminase level    Failure of erection    Fatigue    Fatty liver    Frequent headaches    GERD (gastroesophageal reflux disease)    History of kidney stones    Hyperlipidemia    Hypertension    Hypogonadism in male    Migraine    NAFL (nonalcoholic fatty liver)    Obstructive sleep apnea treated with BiPAP    Psoriasis    Sinus congestion 09/01/2017   Splenomegaly    Thoracic outlet syndrome    Left Arm   Tongue pain 02/09/2019   Past Surgical History:  Procedure Laterality Date   CERVICAL DISC ARTHROPLASTY N/A 02/03/2024   Procedure: C6-7 ARTHROPLASTY;  Surgeon: Venetia Night, MD;  Location: [facility];  Service: Neurosurgery;  Laterality: N/A;   COLONOSCOPY WITH PROPOFOL N/A 11/11/2017   Procedure: COLONOSCOPY WITH PROPOFOL;  Surgeon: Toney Reil, MD;  Location: [facility];  Service: Endoscopy;  Laterality: N/A;   COLONOSCOPY WITH PROPOFOL N/A 10/14/2022   Procedure: COLONOSCOPY WITH PROPOFOL;  Surgeon: Toney Reil, MD;  Location: [facility];  Service: Endoscopy;  Laterality: N/A;  sleep apnea   ESOPHAGOGASTRODUODENOSCOPY (EGD) WITH PROPOFOL N/A 08/25/2019   Procedure: ESOPHAGOGASTRODUODENOSCOPY (EGD) WITH PROPOFOL;  Surgeon: Toney Reil, MD;  Location: [facility];  Service:  Gastroenterology;  Laterality: N/A;   KNEE ARTHROSCOPY WITH MEDIAL MENISECTOMY Left 07/26/2018   Procedure: KNEE ARTHROSCOPY WITH PARTIAL MEDIAL MENISECTOMY;  Surgeon: Signa Kell, MD;  Location: [facility];  Service: Orthopedics;  Laterality: Left;   NASAL SEPTUM SURGERY     nerve block     2 in neck. 5 in back.   POLYPECTOMY  11/11/2017   Procedure: POLYPECTOMY;  Surgeon: Toney Reil, MD;  Location: [facility];  Service: Endoscopy;;   SCALENE NODE BIOPSY / EXCISION     THORACIC OUTLET SURGERY Left 2011   VASECTOMY     Patient Active Problem List   Diagnosis Date Noted   Right arm weakness 02/03/2024   Cervical radiculitis 02/03/2024   Hepatic steatosis 02/03/2024   Abnormal chest CT 01/08/2024   Memory impairment 11/02/2023   Anxiety 11/02/2023   Family history of ASCVD 11/02/2023   GERD (gastroesophageal reflux disease) 11/02/2023   History of colonic polyps    Family history of colon cancer    Encounter for general adult medical examination with abnormal findings 10/06/2022   Splenomegaly 10/03/2022   Left groin pain 09/30/2022   Asthma 07/21/2022   Nausea 04/08/2022   Tremor 04/08/2022   ASIS pain 04/08/2022   Left sided numbness 02/13/2020  Hyperlipidemia 12/07/2019   Radiculopathy, cervical and lumbar 12/06/2019   Abdominal pain, epigastric    Left flank pain 08/15/2019   Elevated LFTs 08/10/2019   Hypogammaglobulinemia (HCC) 05/17/2019   Excessive daytime sleepiness 03/28/2019   Cataplexy 03/28/2019   OSA on CPAP 03/28/2019   Acute pain of left knee 04/18/2018   Cutaneous skin tags 04/18/2018   Lower extremity edema 04/18/2018   Thoracic outlet syndrome 02/19/2018   Pain in limb 02/19/2018   Muscle strain 09/01/2017   Chest pain 03/21/2017   Dyspnea on exertion 02/19/2017   Language difficulty 02/19/2017   BPH with obstruction/lower urinary tract symptoms 12/03/2015   Medication refill 09/13/2015   Erectile disorder, generalized, mild  07/26/2015   Hypogonadism in male 05/24/2015   Environmental allergies 04/13/2015   Sleep apnea 04/13/2015   Migraines 04/13/2015   Neck pain 04/13/2015   Adjustment disorder with mixed anxiety and depressed mood 04/13/2015   Psoriasis 04/13/2015   History of kidney stones 04/13/2015   Neuropathy 04/13/2015   Benign fibroma of prostate 03/24/2015   Hypertension 03/24/2015   Elevation of level of transaminase or lactic acid dehydrogenase (LDH) 03/24/2015   Fatigue 03/24/2015    PCP: Allegra Grana, FNP  REFERRING PROVIDER: Susanne Borders, PA  REFERRING DIAG:  [phone] (ICD-10-CM) - Cervical radiculopathy  R29.898 (ICD-10-CM) - Right arm weakness   THERAPY DIAG:  Cervicalgia  Muscle weakness of right arm  Rationale for Evaluation and Treatment: Rehabilitation  ONSET DATE: 02/03/24  SUBJECTIVE:                                                                                                                                                                                                         SUBJECTIVE STATEMENT: Pt. Reports chronic h/o R UT/ shoulder discomfort.  Pt. Was golfing in [place] in January 2025 and reports increase R forearm/hand numbness. S/p C6-7 arthroplasty on 02/03/24 Hand dominance: Right  PERTINENT HISTORY:  H/o L scalene surgery in 2011 (see MD notes).  Uses BiPAP and sleeps on back.  Enjoys shooting/ golfing.  RTW: 05/02/24 (no restrictions in order to RTW).    PAIN:  Are you having pain? No  PRECAUTIONS: Other: no bending, lifting or twisting  RED FLAGS: None    WEIGHT BEARING RESTRICTIONS: No  FALLS:  Has patient fallen in last 6 months? No  LIVING ENVIRONMENT: Lives with: lives with their spouse Lives in: House/apartment Stairs: Yes: External: 3 steps; on right going up Has following equipment at home: None  OCCUPATION: Network engineer at [employer]  PLOF: Independent  PATIENT GOALS: Return to work/ shooting with no  limitations/ pain.  Increase UE muscle strength  NEXT MD VISIT: PRN  OBJECTIVE:  Note: Objective measures were completed at Evaluation unless otherwise noted.  DIAGNOSTIC FINDINGS:  See MD notes/ imaging before and after surgery.    PATIENT SURVEYS:  NDI 44% self-perceived moderate disability.  COGNITION: Overall cognitive status: Within functional limits for tasks assessed  SENSATION: C2 positive on L  C5-6 hyper reflex. On R  POSTURE: rounded shoulders and forward head Pt. Discussed posture bracing to correct.  PALPATION: Minimal tenderness along anterior cervical incision.  Good incision healing with skin glue in place.     CERVICAL ROM:   Active ROM A/PROM (deg) eval  Flexion 23 deg.  Extension 19 deg.  Right lateral flexion 19 deg.  Left lateral flexion 12 deg.  Right rotation 47 deg.  Left rotation 45 deg.   (Blank rows = not tested)  UPPER EXTREMITY ROM:  Active ROM Right eval Left eval  Shoulder flexion WNL WNL  Shoulder extension    Shoulder abduction    Shoulder adduction    Shoulder extension    Shoulder internal rotation 80 deg. 70 deg.  Shoulder external rotation 90 deg. 90 deg.  Elbow flexion    Elbow extension    Wrist flexion    Wrist extension    Wrist ulnar deviation    Wrist radial deviation    Wrist pronation    Wrist supination     (Blank rows = not tested)  UPPER EXTREMITY MMT:  MMT Right eval Left eval  Shoulder flexion 5 4  Shoulder extension    Shoulder abduction 5 5  Shoulder adduction 4 (concordant pain) 4+      Shoulder internal rotation 5 5  Shoulder external rotation 4 4  Middle trapezius    Lower trapezius    Elbow flexion 4 5  Elbow extension 3+ 4  Wrist flexion    Wrist extension 4 4  Wrist ulnar deviation    Wrist radial deviation    Wrist pronation    Wrist supination    Grip strength 99.8 deg. 96.2#   (Blank rows = not tested)  CERVICAL SPECIAL TESTS:  N/T  FUNCTIONAL TESTS:   Deferred  TREATMENT DATE: 02/22/24                                                                                                                              Subjective:  Pt. Arrived to PT with use of posture brace.  No c/o pain at rest but 2/10 R scapular discomfort in supine position with single pillow support during manual cervical stretches.    Manual tx.:  Supine cervical AROM (all planes) in pain tolerable range.  Gentle cervical stretches (rotn./ lateral flexion)- 5x each with holds in pain tolerable range.    Supine STM to cervical paraspinals/ UT/ posterior deltoid musculature.  R scap. Discomfort.    Assessment of anterior  cervical incision.  Skin glue to peeling off on edges.  Good incision healing noted.    Seated STM with use of Hypervolt to B UT/ upper thoracic musculature.  Good tx. Tolerance.   Neuro.mm.:  Reviewed posture brace/ fit   Reassessment of HEP.  Modified doorway pec stretch to use of room corner.    Supine B shoulder flexion/ horizontal abduction with use of wand 10x each.    PATIENT EDUCATION:  Education details: posture correction/ lifting restrictions/ see HEP Person educated: Patient Education method: Explanation, Demonstration, and Handouts Education comprehension: verbalized understanding and returned demonstration  HOME EXERCISE PROGRAM: Access Code: [facility] URL: [email or web address] Date: 02/17/2024 Prepared by: Dorene Grebe  Exercises - Seated Shoulder Press Ups with Armchair  - 2 x daily - 7 x weekly - 1 sets - 5 reps - Scapular Stabilization - Full Arm Support  - 2 x daily - 7 x weekly - 1 sets - 5 reps - Doorway Pec Stretch at 90 Degrees Abduction  - 2 x daily - 7 x weekly - 1 sets - 5 reps - Standing Lean Away Doorway Stretch  - 2 x daily - 7 x weekly - 1 sets - 5 reps  ASSESSMENT:  CLINICAL IMPRESSION: Pt. Reports no pain currently at rest but increase R scapular discomfort (2/10) in supine position with pillow  support on head.   Pt. Presents with limited cervical AROM (see above) and B UE muscle weakness.  Pt. Instructed to avoid any bending, lifting or twisting at this time per MD protocol.  Good upright posture correction with brace/ stretches.  Pt. Will benefit from skilled PT services to increase cervical AROM/ B UE muscle strength to improve pain-free mobility/ return to work.    OBJECTIVE IMPAIRMENTS: decreased mobility, decreased ROM, decreased strength, hypomobility, impaired flexibility, improper body mechanics, postural dysfunction, and pain.   ACTIVITY LIMITATIONS: carrying, lifting, sleeping, locomotion level, and caring for others  PARTICIPATION LIMITATIONS: cleaning, driving, shopping, community activity, occupation, and yard work  PERSONAL FACTORS: Fitness and Past/current experiences are also affecting patient's functional outcome.   REHAB POTENTIAL: Good  CLINICAL DECISION MAKING: Stable/uncomplicated  EVALUATION COMPLEXITY: Low   GOALS: Goals reviewed with patient? Yes  SHORT TERM GOALS: Target date: 03/02/24  Pt. Independent with HEP to increase cervical AROM to [facility] and upright posture to improve pain-free mobility.   Baseline: pt. Issued gentle stretches.  Goal status: INITIAL   LONG TERM GOALS: Target date: 03/16/24  Pt. Will decrease NDI to <20% to improve pain-free functional mobility.   Baseline: 44% self-perceived moderate disability.   Goal status: INITIAL  2.  Pt. Independent with UE strengthening ex. Program to increase B UE to 5/5 MMT to improve return to work.   Baseline: no UE strengthening at this time Goal status: INITIAL  3.  Pt. Able to return to shooting/ household chores with no cervical pain or limitations.   Baseline: currently not able Goal status: INITIAL  PLAN:  PT FREQUENCY: 1-2x/week  PT DURATION: 4 weeks  PLANNED INTERVENTIONS: 97110-Therapeutic exercises, 97530- Therapeutic activity, [id number]- Neuromuscular re-education, 97535- Self  Care, 16109- Manual therapy, G0283- Electrical stimulation (unattended), Patient/Family education, Joint mobilization, Cryotherapy, and Moist heat  PLAN FOR NEXT SESSION: Issue light resisted UE/posture strengthening.    Cammie Mcgee, PT, DPT # [phone] 02/24/2024, 8:59 AM

## 2024-02-24 NOTE — Therapy (Signed)
 OUTPATIENT PHYSICAL THERAPY CERVICAL TREATMENT  Patient Name: Daniel Calderon MRN: [id number] DOB:09-Oct-1966, 58 y.o., male Today's Date: 02/24/2024   PT End of Session - 02/24/24 1011     Visit Number 3    Number of Visits 8    Date for PT Re-Evaluation 03/16/24    PT Start Time 1032    PT Stop Time 1123    PT Time Calculation (min) 51 min             Past Medical History:  Diagnosis Date   Allergy    Seasonal   Anxiety    Arthritis    Asthma    Benign enlargement of prostate    Colon polyps    Complex sleep apnea syndrome    Depression    Elevated BP    Elevated transaminase level    Failure of erection    Fatigue    Fatty liver    Frequent headaches    GERD (gastroesophageal reflux disease)    History of kidney stones    Hyperlipidemia    Hypertension    Hypogonadism in male    Migraine    NAFL (nonalcoholic fatty liver)    Obstructive sleep apnea treated with BiPAP    Psoriasis    Sinus congestion 09/01/2017   Splenomegaly    Thoracic outlet syndrome    Left Arm   Tongue pain 02/09/2019   Past Surgical History:  Procedure Laterality Date   CERVICAL DISC ARTHROPLASTY N/A 02/03/2024   Procedure: C6-7 ARTHROPLASTY;  Surgeon: Venetia Night, MD;  Location: [facility];  Service: Neurosurgery;  Laterality: N/A;   COLONOSCOPY WITH PROPOFOL N/A 11/11/2017   Procedure: COLONOSCOPY WITH PROPOFOL;  Surgeon: Toney Reil, MD;  Location: [facility];  Service: Endoscopy;  Laterality: N/A;   COLONOSCOPY WITH PROPOFOL N/A 10/14/2022   Procedure: COLONOSCOPY WITH PROPOFOL;  Surgeon: Toney Reil, MD;  Location: [facility];  Service: Endoscopy;  Laterality: N/A;  sleep apnea   ESOPHAGOGASTRODUODENOSCOPY (EGD) WITH PROPOFOL N/A 08/25/2019   Procedure: ESOPHAGOGASTRODUODENOSCOPY (EGD) WITH PROPOFOL;  Surgeon: Toney Reil, MD;  Location: [facility];  Service: Gastroenterology;  Laterality: N/A;   KNEE ARTHROSCOPY WITH MEDIAL  MENISECTOMY Left 07/26/2018   Procedure: KNEE ARTHROSCOPY WITH PARTIAL MEDIAL MENISECTOMY;  Surgeon: Signa Kell, MD;  Location: [facility];  Service: Orthopedics;  Laterality: Left;   NASAL SEPTUM SURGERY     nerve block     2 in neck. 5 in back.   POLYPECTOMY  11/11/2017   Procedure: POLYPECTOMY;  Surgeon: Toney Reil, MD;  Location: [facility];  Service: Endoscopy;;   SCALENE NODE BIOPSY / EXCISION     THORACIC OUTLET SURGERY Left 2011   VASECTOMY     Patient Active Problem List   Diagnosis Date Noted   Right arm weakness 02/03/2024   Cervical radiculitis 02/03/2024   Hepatic steatosis 02/03/2024   Abnormal chest CT 01/08/2024   Memory impairment 11/02/2023   Anxiety 11/02/2023   Family history of ASCVD 11/02/2023   GERD (gastroesophageal reflux disease) 11/02/2023   History of colonic polyps    Family history of colon cancer    Encounter for general adult medical examination with abnormal findings 10/06/2022   Splenomegaly 10/03/2022   Left groin pain 09/30/2022   Asthma 07/21/2022   Nausea 04/08/2022   Tremor 04/08/2022   ASIS pain 04/08/2022   Left sided numbness 02/13/2020   Hyperlipidemia 12/07/2019   Radiculopathy, cervical and lumbar 12/06/2019   Abdominal  pain, epigastric    Left flank pain 08/15/2019   Elevated LFTs 08/10/2019   Hypogammaglobulinemia (HCC) 05/17/2019   Excessive daytime sleepiness 03/28/2019   Cataplexy 03/28/2019   OSA on CPAP 03/28/2019   Acute pain of left knee 04/18/2018   Cutaneous skin tags 04/18/2018   Lower extremity edema 04/18/2018   Thoracic outlet syndrome 02/19/2018   Pain in limb 02/19/2018   Muscle strain 09/01/2017   Chest pain 03/21/2017   Dyspnea on exertion 02/19/2017   Language difficulty 02/19/2017   BPH with obstruction/lower urinary tract symptoms 12/03/2015   Medication refill 09/13/2015   Erectile disorder, generalized, mild 07/26/2015   Hypogonadism in male 05/24/2015   Environmental allergies  04/13/2015   Sleep apnea 04/13/2015   Migraines 04/13/2015   Neck pain 04/13/2015   Adjustment disorder with mixed anxiety and depressed mood 04/13/2015   Psoriasis 04/13/2015   History of kidney stones 04/13/2015   Neuropathy 04/13/2015   Benign fibroma of prostate 03/24/2015   Hypertension 03/24/2015   Elevation of level of transaminase or lactic acid dehydrogenase (LDH) 03/24/2015   Fatigue 03/24/2015    PCP: Allegra Grana, FNP  REFERRING PROVIDER: Susanne Borders, PA  REFERRING DIAG:  [phone] (ICD-10-CM) - Cervical radiculopathy  R29.898 (ICD-10-CM) - Right arm weakness   THERAPY DIAG:  Cervicalgia  Muscle weakness of right arm  Rationale for Evaluation and Treatment: Rehabilitation  ONSET DATE: 02/03/24  SUBJECTIVE:                                                                                                                                                                                                         SUBJECTIVE STATEMENT: Pt. Reports chronic h/o R UT/ shoulder discomfort.  Pt. Was golfing in [place] in January 2025 and reports increase R forearm/hand numbness. S/p C6-7 arthroplasty on 02/03/24 Hand dominance: Right  PERTINENT HISTORY:  H/o L scalene surgery in 2011 (see MD notes).  Uses BiPAP and sleeps on back.  Enjoys shooting/ golfing.  RTW: 05/02/24 (no restrictions in order to RTW).    PAIN:  Are you having pain? No  PRECAUTIONS: Other: no bending, lifting or twisting  RED FLAGS: None    WEIGHT BEARING RESTRICTIONS: No  FALLS:  Has patient fallen in last 6 months? No  LIVING ENVIRONMENT: Lives with: lives with their spouse Lives in: House/apartment Stairs: Yes: External: 3 steps; on right going up Has following equipment at home: None  OCCUPATION: Supervisor Maintenance at [employer]  PLOF: Independent  PATIENT GOALS: Return to work/ shooting with no limitations/ pain.  Increase UE muscle strength  NEXT MD VISIT:  PRN  OBJECTIVE:  Note: Objective measures were completed at Evaluation unless otherwise noted.  DIAGNOSTIC FINDINGS:  See MD notes/ imaging before and after surgery.    PATIENT SURVEYS:  NDI 44% self-perceived moderate disability.  COGNITION: Overall cognitive status: Within functional limits for tasks assessed  SENSATION: C2 positive on L  C5-6 hyper reflex. On R  POSTURE: rounded shoulders and forward head Pt. Discussed posture bracing to correct.  PALPATION: Minimal tenderness along anterior cervical incision.  Good incision healing with skin glue in place.     CERVICAL ROM:   Active ROM A/PROM (deg) eval  Flexion 23 deg.  Extension 19 deg.  Right lateral flexion 19 deg.  Left lateral flexion 12 deg.  Right rotation 47 deg.  Left rotation 45 deg.   (Blank rows = not tested)  UPPER EXTREMITY ROM:  Active ROM Right eval Left eval  Shoulder flexion WNL WNL  Shoulder extension    Shoulder abduction    Shoulder adduction    Shoulder extension    Shoulder internal rotation 80 deg. 70 deg.  Shoulder external rotation 90 deg. 90 deg.  Elbow flexion    Elbow extension    Wrist flexion    Wrist extension    Wrist ulnar deviation    Wrist radial deviation    Wrist pronation    Wrist supination     (Blank rows = not tested)  UPPER EXTREMITY MMT:  MMT Right eval Left eval  Shoulder flexion 5 4  Shoulder extension    Shoulder abduction 5 5  Shoulder adduction 4 (concordant pain) 4+      Shoulder internal rotation 5 5  Shoulder external rotation 4 4  Middle trapezius    Lower trapezius    Elbow flexion 4 5  Elbow extension 3+ 4  Wrist flexion    Wrist extension 4 4  Wrist ulnar deviation    Wrist radial deviation    Wrist pronation    Wrist supination    Grip strength 99.8 deg. 96.2#   (Blank rows = not tested)  CERVICAL SPECIAL TESTS:  N/T  FUNCTIONAL TESTS:  Deferred  TREATMENT DATE: 02/24/2024                                                                                                                               Subjective:  Pt. Arrived to PT with no new complaints.  Pt. Compliant with HEP.  Pt. Wearing posture brace.   There.ex.:  B UBE 1 min. F/b (5 min. Total).  Consistent pace/ no pain.  Good upright posture.    Standing B shoulder flexion/ abduction/ IR with wand (mirror feedback).   Standing posture with dowel rod and trunk rotn. Stretches.     RTB: "W", diagonals/ scap. Retraction/ bicep curls 20x each.  Mirror feedback and cuing for proper posture/ head position.    Manual tx.:  Supine cervical  AROM (all planes) in pain tolerable range.  Gentle cervical stretches (rotn./ lateral flexion)- 5x each with holds in pain tolerable range.    Supine STM to cervical paraspinals/ UT/ posterior deltoid musculature.  R scap. Discomfort.    Seated STM with use of Hypervolt to B UT/ upper thoracic musculature.  Good tx. Tolerance.   PATIENT EDUCATION:  Education details: posture correction/ lifting restrictions/ see HEP Person educated: Patient Education method: Explanation, Demonstration, and Handouts Education comprehension: verbalized understanding and returned demonstration  HOME EXERCISE PROGRAM: Access Code: [facility] URL: [email or web address] Date: 02/17/2024 Prepared by: Dorene Grebe  Exercises - Seated Shoulder Press Ups with Armchair  - 2 x daily - 7 x weekly - 1 sets - 5 reps - Scapular Stabilization - Full Arm Support  - 2 x daily - 7 x weekly - 1 sets - 5 reps - Doorway Pec Stretch at 90 Degrees Abduction  - 2 x daily - 7 x weekly - 1 sets - 5 reps - Standing Lean Away Doorway Stretch  - 2 x daily - 7 x weekly - 1 sets - 5 reps  ASSESSMENT:  CLINICAL IMPRESSION: Pt. Reports no pain currently at rest but increase R scapular discomfort (2/10) in supine position with pillow support on head.  2 episodes of R forearm muscle pain with overhead reaching/ sh. Abduction.  Pt. Presents with  limited cervical AROM (see above) and B UE muscle weakness.  Pt. Instructed to avoid any bending, lifting or twisting at this time per MD protocol.  Good upright posture correction with addition of light UE/posture resisted ex.  Pt. Will benefit from skilled PT services to increase cervical AROM/ B UE muscle strength to improve pain-free mobility/ return to work.    OBJECTIVE IMPAIRMENTS: decreased mobility, decreased ROM, decreased strength, hypomobility, impaired flexibility, improper body mechanics, postural dysfunction, and pain.   ACTIVITY LIMITATIONS: carrying, lifting, sleeping, locomotion level, and caring for others  PARTICIPATION LIMITATIONS: cleaning, driving, shopping, community activity, occupation, and yard work  PERSONAL FACTORS: Fitness and Past/current experiences are also affecting patient's functional outcome.   REHAB POTENTIAL: Good  CLINICAL DECISION MAKING: Stable/uncomplicated  EVALUATION COMPLEXITY: Low   GOALS: Goals reviewed with patient? Yes  SHORT TERM GOALS: Target date: 03/02/24  Pt. Independent with HEP to increase cervical AROM to [facility] and upright posture to improve pain-free mobility.   Baseline: pt. Issued gentle stretches.  Goal status: INITIAL   LONG TERM GOALS: Target date: 03/16/24  Pt. Will decrease NDI to <20% to improve pain-free functional mobility.   Baseline: 44% self-perceived moderate disability.   Goal status: INITIAL  2.  Pt. Independent with UE strengthening ex. Program to increase B UE to 5/5 MMT to improve return to work.   Baseline: no UE strengthening at this time Goal status: INITIAL  3.  Pt. Able to return to shooting/ household chores with no cervical pain or limitations.   Baseline: currently not able Goal status: INITIAL  PLAN:  PT FREQUENCY: 1-2x/week  PT DURATION: 4 weeks  PLANNED INTERVENTIONS: 97110-Therapeutic exercises, 97530- Therapeutic activity, [id number]- Neuromuscular re-education, 97535- Self Care, 16109-  Manual therapy, G0283- Electrical stimulation (unattended), Patient/Family education, Joint mobilization, Cryotherapy, and Moist heat  PLAN FOR NEXT SESSION: Issue light resisted UE/posture strengthening.    Cammie Mcgee, PT, DPT # [phone] 02/24/2024, 2:05 PM

## 2024-02-29 ENCOUNTER — Encounter: Payer: Self-pay | Admitting: Physical Therapy

## 2024-02-29 ENCOUNTER — Ambulatory Visit: Payer: Commercial Managed Care - PPO | Admitting: Physical Therapy

## 2024-02-29 DIAGNOSIS — M6281 Muscle weakness (generalized): Secondary | ICD-10-CM

## 2024-02-29 DIAGNOSIS — M542 Cervicalgia: Secondary | ICD-10-CM

## 2024-02-29 NOTE — Therapy (Signed)
 OUTPATIENT PHYSICAL THERAPY CERVICAL TREATMENT  Patient Name: Daniel Calderon MRN: [id number] DOB:Jul 28, 1966, 58 y.o., male Today's Date: 02/29/2024   PT End of Session - 02/29/24 1250     Visit Number 4    Number of Visits 8    Date for PT Re-Evaluation 03/16/24    PT Start Time 1250    PT Stop Time 1345    PT Time Calculation (min) 55 min             Past Medical History:  Diagnosis Date   Allergy    Seasonal   Anxiety    Arthritis    Asthma    Benign enlargement of prostate    Colon polyps    Complex sleep apnea syndrome    Depression    Elevated BP    Elevated transaminase level    Failure of erection    Fatigue    Fatty liver    Frequent headaches    GERD (gastroesophageal reflux disease)    History of kidney stones    Hyperlipidemia    Hypertension    Hypogonadism in male    Migraine    NAFL (nonalcoholic fatty liver)    Obstructive sleep apnea treated with BiPAP    Psoriasis    Sinus congestion 09/01/2017   Splenomegaly    Thoracic outlet syndrome    Left Arm   Tongue pain 02/09/2019   Past Surgical History:  Procedure Laterality Date   CERVICAL DISC ARTHROPLASTY N/A 02/03/2024   Procedure: C6-7 ARTHROPLASTY;  Surgeon: Venetia Night, MD;  Location: [facility];  Service: Neurosurgery;  Laterality: N/A;   COLONOSCOPY WITH PROPOFOL N/A 11/11/2017   Procedure: COLONOSCOPY WITH PROPOFOL;  Surgeon: Toney Reil, MD;  Location: [facility];  Service: Endoscopy;  Laterality: N/A;   COLONOSCOPY WITH PROPOFOL N/A 10/14/2022   Procedure: COLONOSCOPY WITH PROPOFOL;  Surgeon: Toney Reil, MD;  Location: [facility];  Service: Endoscopy;  Laterality: N/A;  sleep apnea   ESOPHAGOGASTRODUODENOSCOPY (EGD) WITH PROPOFOL N/A 08/25/2019   Procedure: ESOPHAGOGASTRODUODENOSCOPY (EGD) WITH PROPOFOL;  Surgeon: Toney Reil, MD;  Location: [facility];  Service: Gastroenterology;  Laterality: N/A;   KNEE ARTHROSCOPY WITH MEDIAL  MENISECTOMY Left 07/26/2018   Procedure: KNEE ARTHROSCOPY WITH PARTIAL MEDIAL MENISECTOMY;  Surgeon: Signa Kell, MD;  Location: [facility];  Service: Orthopedics;  Laterality: Left;   NASAL SEPTUM SURGERY     nerve block     2 in neck. 5 in back.   POLYPECTOMY  11/11/2017   Procedure: POLYPECTOMY;  Surgeon: Toney Reil, MD;  Location: [facility];  Service: Endoscopy;;   SCALENE NODE BIOPSY / EXCISION     THORACIC OUTLET SURGERY Left 2011   VASECTOMY     Patient Active Problem List   Diagnosis Date Noted   Right arm weakness 02/03/2024   Cervical radiculitis 02/03/2024   Hepatic steatosis 02/03/2024   Abnormal chest CT 01/08/2024   Memory impairment 11/02/2023   Anxiety 11/02/2023   Family history of ASCVD 11/02/2023   GERD (gastroesophageal reflux disease) 11/02/2023   History of colonic polyps    Family history of colon cancer    Encounter for general adult medical examination with abnormal findings 10/06/2022   Splenomegaly 10/03/2022   Left groin pain 09/30/2022   Asthma 07/21/2022   Nausea 04/08/2022   Tremor 04/08/2022   ASIS pain 04/08/2022   Left sided numbness 02/13/2020   Hyperlipidemia 12/07/2019   Radiculopathy, cervical and lumbar 12/06/2019   Abdominal  pain, epigastric    Left flank pain 08/15/2019   Elevated LFTs 08/10/2019   Hypogammaglobulinemia (HCC) 05/17/2019   Excessive daytime sleepiness 03/28/2019   Cataplexy 03/28/2019   OSA on CPAP 03/28/2019   Acute pain of left knee 04/18/2018   Cutaneous skin tags 04/18/2018   Lower extremity edema 04/18/2018   Thoracic outlet syndrome 02/19/2018   Pain in limb 02/19/2018   Muscle strain 09/01/2017   Chest pain 03/21/2017   Dyspnea on exertion 02/19/2017   Language difficulty 02/19/2017   BPH with obstruction/lower urinary tract symptoms 12/03/2015   Medication refill 09/13/2015   Erectile disorder, generalized, mild 07/26/2015   Hypogonadism in male 05/24/2015   Environmental allergies  04/13/2015   Sleep apnea 04/13/2015   Migraines 04/13/2015   Neck pain 04/13/2015   Adjustment disorder with mixed anxiety and depressed mood 04/13/2015   Psoriasis 04/13/2015   History of kidney stones 04/13/2015   Neuropathy 04/13/2015   Benign fibroma of prostate 03/24/2015   Hypertension 03/24/2015   Elevation of level of transaminase or lactic acid dehydrogenase (LDH) 03/24/2015   Fatigue 03/24/2015    PCP: Allegra Grana, FNP  REFERRING PROVIDER: Susanne Borders, PA  REFERRING DIAG:  [phone] (ICD-10-CM) - Cervical radiculopathy  R29.898 (ICD-10-CM) - Right arm weakness   THERAPY DIAG:  Cervicalgia  Muscle weakness of right arm  Rationale for Evaluation and Treatment: Rehabilitation  ONSET DATE: 02/03/24  SUBJECTIVE:                                                                                                                                                                                                         SUBJECTIVE STATEMENT: Pt. Reports chronic h/o R UT/ shoulder discomfort.  Pt. Was golfing in [place] in January 2025 and reports increase R forearm/hand numbness. S/p C6-7 arthroplasty on 02/03/24 Hand dominance: Right  PERTINENT HISTORY:  H/o L scalene surgery in 2011 (see MD notes).  Uses BiPAP and sleeps on back.  Enjoys shooting/ golfing.  RTW: 05/02/24 (no restrictions in order to RTW).    PAIN:  Are you having pain? No  PRECAUTIONS: Other: no bending, lifting or twisting  RED FLAGS: None    WEIGHT BEARING RESTRICTIONS: No  FALLS:  Has patient fallen in last 6 months? No  LIVING ENVIRONMENT: Lives with: lives with their spouse Lives in: House/apartment Stairs: Yes: External: 3 steps; on right going up Has following equipment at home: None  OCCUPATION: Supervisor Maintenance at [employer]  PLOF: Independent  PATIENT GOALS: Return to work/ shooting with no limitations/ pain.  Increase UE muscle strength  NEXT MD VISIT:  PRN  OBJECTIVE:  Note: Objective measures were completed at Evaluation unless otherwise noted.  DIAGNOSTIC FINDINGS:  See MD notes/ imaging before and after surgery.    PATIENT SURVEYS:  NDI 44% self-perceived moderate disability.  COGNITION: Overall cognitive status: Within functional limits for tasks assessed  SENSATION: C2 positive on L  C5-6 hyper reflex. On R  POSTURE: rounded shoulders and forward head Pt. Discussed posture bracing to correct.  PALPATION: Minimal tenderness along anterior cervical incision.  Good incision healing with skin glue in place.     CERVICAL ROM:   Active ROM A/PROM (deg) eval  Flexion 23 deg.  Extension 19 deg.  Right lateral flexion 19 deg.  Left lateral flexion 12 deg.  Right rotation 47 deg.  Left rotation 45 deg.   (Blank rows = not tested)  UPPER EXTREMITY ROM:  Active ROM Right eval Left eval  Shoulder flexion WNL WNL  Shoulder extension    Shoulder abduction    Shoulder adduction    Shoulder extension    Shoulder internal rotation 80 deg. 70 deg.  Shoulder external rotation 90 deg. 90 deg.  Elbow flexion    Elbow extension    Wrist flexion    Wrist extension    Wrist ulnar deviation    Wrist radial deviation    Wrist pronation    Wrist supination     (Blank rows = not tested)  UPPER EXTREMITY MMT:  MMT Right eval Left eval  Shoulder flexion 5 4  Shoulder extension    Shoulder abduction 5 5  Shoulder adduction 4 (concordant pain) 4+      Shoulder internal rotation 5 5  Shoulder external rotation 4 4  Middle trapezius    Lower trapezius    Elbow flexion 4 5  Elbow extension 3+ 4  Wrist flexion    Wrist extension 4 4  Wrist ulnar deviation    Wrist radial deviation    Wrist pronation    Wrist supination    Grip strength 99.8 deg. 96.2#   (Blank rows = not tested)  CERVICAL SPECIAL TESTS:  N/T  FUNCTIONAL TESTS:  Deferred  TREATMENT DATE: 02/29/2024                                                                                                                               Subjective:  Pt. Arrived to PT with c/o headache since last PT tx. Pt. Reports tightness/ discomfort in R UT (trigger point noted).  Pt. Wearing posture brace approx. 2 hours/day.  Pt. Reports no difficulty sleeping and weaning off the Lyrica medication.    There.ex.:  B UBE 1 min. F/b (6 min. Total).  Consistent pace/ no pain.  Good upright posture.    Standing chin tucks 5x with mirror feedback.  Standing scap. Retraction (no resistance) with upright posture correction (good technique).     RTB: sh. ER/ scap. Retraction/  sh. Extension/ bicep curls 20x each.  Mirror feedback and cuing for proper posture/ head position.  Pt. States he does not like "W" ex. (Modified with ER at sides).    Seated/ supine cervical isometrics (lateral flexion/ rotn.)- very gentle with 5 sec. Holds.  Supine chin tuck 10x with holds (slight increase in discomfort).    Manual tx.:  Supine cervical AROM (all planes) in pain tolerable range.  Gentle cervical stretches (rotn./ lateral flexion)- 5x each with holds in pain tolerable range.    Supine STM to cervical paraspinals/ UT/ posterior deltoid musculature.  R scap. Discomfort.    Seated STM with use of Hypervolt to B UT/ upper thoracic musculature.  Good tx. Tolerance.   Ice to B UT/ lower cervical spine in seated posture after tx.    PATIENT EDUCATION:  Education details: posture correction/ lifting restrictions/ see HEP Person educated: Patient Education method: Explanation, Demonstration, and Handouts Education comprehension: verbalized understanding and returned demonstration  HOME EXERCISE PROGRAM: Access Code: [facility] URL: [email or web address] Date: 02/17/2024 Prepared by: Dorene Grebe  Exercises - Seated Shoulder Press Ups with Armchair  - 2 x daily - 7 x weekly - 1 sets - 5 reps - Scapular Stabilization - Full Arm Support  - 2 x daily - 7 x weekly  - 1 sets - 5 reps - Doorway Pec Stretch at 90 Degrees Abduction  - 2 x daily - 7 x weekly - 1 sets - 5 reps - Standing Lean Away Doorway Stretch  - 2 x daily - 7 x weekly - 1 sets - 5 reps  ASSESSMENT:  CLINICAL IMPRESSION: No episodes of R forearm muscle pain during tx. But persistent B UT tightness/ tension HA during tx.  Pt. Presents with limited cervical AROM (see above) and B UE muscle weakness.  Pt. Instructed to avoid any bending, lifting or twisting at this time per MD protocol.  Good upright posture correction with addition of light UE/posture resisted ex.  PT focused on B UT muscle tightness with STM (use of Hypervolt)/ gentle stretches.  Pt. Instructed to avoid any pain provoking movement patterns/ lifting at home and focus on posture correction/ gentle stretches.  Pt. Will benefit from skilled PT services to increase cervical AROM/ B UE muscle strength to improve pain-free mobility/ return to work.    OBJECTIVE IMPAIRMENTS: decreased mobility, decreased ROM, decreased strength, hypomobility, impaired flexibility, improper body mechanics, postural dysfunction, and pain.   ACTIVITY LIMITATIONS: carrying, lifting, sleeping, locomotion level, and caring for others  PARTICIPATION LIMITATIONS: cleaning, driving, shopping, community activity, occupation, and yard work  PERSONAL FACTORS: Fitness and Past/current experiences are also affecting patient's functional outcome.   REHAB POTENTIAL: Good  CLINICAL DECISION MAKING: Stable/uncomplicated  EVALUATION COMPLEXITY: Low   GOALS: Goals reviewed with patient? Yes  SHORT TERM GOALS: Target date: 03/02/24  Pt. Independent with HEP to increase cervical AROM to [facility] and upright posture to improve pain-free mobility.   Baseline: pt. Issued gentle stretches.  Goal status: INITIAL   LONG TERM GOALS: Target date: 03/16/24  Pt. Will decrease NDI to <20% to improve pain-free functional mobility.   Baseline: 44% self-perceived moderate  disability.   Goal status: INITIAL  2.  Pt. Independent with UE strengthening ex. Program to increase B UE to 5/5 MMT to improve return to work.   Baseline: no UE strengthening at this time Goal status: INITIAL  3.  Pt. Able to return to shooting/ household chores with no cervical pain or limitations.   Baseline: currently  not able Goal status: INITIAL  PLAN:  PT FREQUENCY: 1-2x/week  PT DURATION: 4 weeks  PLANNED INTERVENTIONS: [id number]-Therapeutic exercises, 97530- Therapeutic activity, [id number]- Neuromuscular re-education, 97535- Self Care, [id number]- Manual therapy, [id number]- Electrical stimulation (unattended), Patient/Family education, Joint mobilization, Cryotherapy, and Moist heat  PLAN FOR NEXT SESSION: Issue light resisted UE/posture strengthening.    Cammie Mcgee, PT, DPT # [phone] 02/29/2024, 2:01 PM

## 2024-03-02 ENCOUNTER — Encounter: Payer: Self-pay | Admitting: Physical Therapy

## 2024-03-02 ENCOUNTER — Ambulatory Visit: Payer: Commercial Managed Care - PPO | Admitting: Physical Therapy

## 2024-03-02 DIAGNOSIS — M6281 Muscle weakness (generalized): Secondary | ICD-10-CM | POA: Diagnosis not present

## 2024-03-02 DIAGNOSIS — M542 Cervicalgia: Secondary | ICD-10-CM

## 2024-03-02 NOTE — Therapy (Signed)
 OUTPATIENT PHYSICAL THERAPY CERVICAL TREATMENT  Patient Name: Daniel Calderon MRN: [id number] DOB:01/26/1966, 58 y.o., male Today's Date: 03/02/2024   PT End of Session - 03/02/24 1302     Visit Number 5    Number of Visits 8    Date for PT Re-Evaluation 03/16/24    PT Start Time 1239    PT Stop Time 1338    PT Time Calculation (min) 59 min             Past Medical History:  Diagnosis Date   Allergy    Seasonal   Anxiety    Arthritis    Asthma    Benign enlargement of prostate    Colon polyps    Complex sleep apnea syndrome    Depression    Elevated BP    Elevated transaminase level    Failure of erection    Fatigue    Fatty liver    Frequent headaches    GERD (gastroesophageal reflux disease)    History of kidney stones    Hyperlipidemia    Hypertension    Hypogonadism in male    Migraine    NAFL (nonalcoholic fatty liver)    Obstructive sleep apnea treated with BiPAP    Psoriasis    Sinus congestion 09/01/2017   Splenomegaly    Thoracic outlet syndrome    Left Arm   Tongue pain 02/09/2019   Past Surgical History:  Procedure Laterality Date   CERVICAL DISC ARTHROPLASTY N/A 02/03/2024   Procedure: C6-7 ARTHROPLASTY;  Surgeon: Venetia Night, MD;  Location: [facility];  Service: Neurosurgery;  Laterality: N/A;   COLONOSCOPY WITH PROPOFOL N/A 11/11/2017   Procedure: COLONOSCOPY WITH PROPOFOL;  Surgeon: Toney Reil, MD;  Location: [facility];  Service: Endoscopy;  Laterality: N/A;   COLONOSCOPY WITH PROPOFOL N/A 10/14/2022   Procedure: COLONOSCOPY WITH PROPOFOL;  Surgeon: Toney Reil, MD;  Location: [facility];  Service: Endoscopy;  Laterality: N/A;  sleep apnea   ESOPHAGOGASTRODUODENOSCOPY (EGD) WITH PROPOFOL N/A 08/25/2019   Procedure: ESOPHAGOGASTRODUODENOSCOPY (EGD) WITH PROPOFOL;  Surgeon: Toney Reil, MD;  Location: [facility];  Service: Gastroenterology;  Laterality: N/A;   KNEE ARTHROSCOPY WITH MEDIAL  MENISECTOMY Left 07/26/2018   Procedure: KNEE ARTHROSCOPY WITH PARTIAL MEDIAL MENISECTOMY;  Surgeon: Signa Kell, MD;  Location: [facility];  Service: Orthopedics;  Laterality: Left;   NASAL SEPTUM SURGERY     nerve block     2 in neck. 5 in back.   POLYPECTOMY  11/11/2017   Procedure: POLYPECTOMY;  Surgeon: Toney Reil, MD;  Location: [facility];  Service: Endoscopy;;   SCALENE NODE BIOPSY / EXCISION     THORACIC OUTLET SURGERY Left 2011   VASECTOMY     Patient Active Problem List   Diagnosis Date Noted   Right arm weakness 02/03/2024   Cervical radiculitis 02/03/2024   Hepatic steatosis 02/03/2024   Abnormal chest CT 01/08/2024   Memory impairment 11/02/2023   Anxiety 11/02/2023   Family history of ASCVD 11/02/2023   GERD (gastroesophageal reflux disease) 11/02/2023   History of colonic polyps    Family history of colon cancer    Encounter for general adult medical examination with abnormal findings 10/06/2022   Splenomegaly 10/03/2022   Left groin pain 09/30/2022   Asthma 07/21/2022   Nausea 04/08/2022   Tremor 04/08/2022   ASIS pain 04/08/2022   Left sided numbness 02/13/2020   Hyperlipidemia 12/07/2019   Radiculopathy, cervical and lumbar 12/06/2019   Abdominal  pain, epigastric    Left flank pain 08/15/2019   Elevated LFTs 08/10/2019   Hypogammaglobulinemia (HCC) 05/17/2019   Excessive daytime sleepiness 03/28/2019   Cataplexy 03/28/2019   OSA on CPAP 03/28/2019   Acute pain of left knee 04/18/2018   Cutaneous skin tags 04/18/2018   Lower extremity edema 04/18/2018   Thoracic outlet syndrome 02/19/2018   Pain in limb 02/19/2018   Muscle strain 09/01/2017   Chest pain 03/21/2017   Dyspnea on exertion 02/19/2017   Language difficulty 02/19/2017   BPH with obstruction/lower urinary tract symptoms 12/03/2015   Medication refill 09/13/2015   Erectile disorder, generalized, mild 07/26/2015   Hypogonadism in male 05/24/2015   Environmental allergies  04/13/2015   Sleep apnea 04/13/2015   Migraines 04/13/2015   Neck pain 04/13/2015   Adjustment disorder with mixed anxiety and depressed mood 04/13/2015   Psoriasis 04/13/2015   History of kidney stones 04/13/2015   Neuropathy 04/13/2015   Benign fibroma of prostate 03/24/2015   Hypertension 03/24/2015   Elevation of level of transaminase or lactic acid dehydrogenase (LDH) 03/24/2015   Fatigue 03/24/2015   PCP: Allegra Grana, FNP  REFERRING PROVIDER: Susanne Borders, PA  REFERRING DIAG:  [id number] (ICD-10-CM) - Cervical radiculopathy  R29.898 (ICD-10-CM) - Right arm weakness   THERAPY DIAG:  Cervicalgia  Muscle weakness of right arm  Rationale for Evaluation and Treatment: Rehabilitation  ONSET DATE: 02/03/24  SUBJECTIVE:                                                                                                                                                                                                         SUBJECTIVE STATEMENT: Pt. Reports chronic h/o R UT/ shoulder discomfort.  Pt. Was golfing in [place] in January 2025 and reports increase R forearm/hand numbness. S/p C6-7 arthroplasty on 02/03/24 Hand dominance: Right  PERTINENT HISTORY:  H/o L scalene surgery in 2011 (see MD notes).  Uses BiPAP and sleeps on back.  Enjoys shooting/ golfing.  RTW: 05/02/24 (no restrictions in order to RTW).    PAIN:  Are you having pain? No  PRECAUTIONS: Other: no bending, lifting or twisting  RED FLAGS: None    WEIGHT BEARING RESTRICTIONS: No  FALLS:  Has patient fallen in last 6 months? No  LIVING ENVIRONMENT: Lives with: lives with their spouse Lives in: House/apartment Stairs: Yes: External: 3 steps; on right going up Has following equipment at home: None  OCCUPATION: Supervisor Maintenance at [employer]  PLOF: Independent  PATIENT GOALS: Return to work/ shooting with no limitations/ pain.  Increase  UE muscle strength  NEXT MD VISIT:  PRN  OBJECTIVE:  Note: Objective measures were completed at Evaluation unless otherwise noted.  DIAGNOSTIC FINDINGS:  See MD notes/ imaging before and after surgery.    PATIENT SURVEYS:  NDI 44% self-perceived moderate disability.  COGNITION: Overall cognitive status: Within functional limits for tasks assessed  SENSATION: C2 positive on L  C5-6 hyper reflex. On R  POSTURE: rounded shoulders and forward head Pt. Discussed posture bracing to correct.  PALPATION: Minimal tenderness along anterior cervical incision.  Good incision healing with skin glue in place.     CERVICAL ROM:   Active ROM A/PROM (deg) eval  Flexion 23 deg.  Extension 19 deg.  Right lateral flexion 19 deg.  Left lateral flexion 12 deg.  Right rotation 47 deg.  Left rotation 45 deg.   (Blank rows = not tested)  UPPER EXTREMITY ROM:  Active ROM Right eval Left eval  Shoulder flexion WNL WNL  Shoulder extension    Shoulder abduction    Shoulder adduction    Shoulder extension    Shoulder internal rotation 80 deg. 70 deg.  Shoulder external rotation 90 deg. 90 deg.  Elbow flexion    Elbow extension    Wrist flexion    Wrist extension    Wrist ulnar deviation    Wrist radial deviation    Wrist pronation    Wrist supination     (Blank rows = not tested)  UPPER EXTREMITY MMT:  MMT Right eval Left eval  Shoulder flexion 5 4  Shoulder extension    Shoulder abduction 5 5  Shoulder adduction 4 (concordant pain) 4+      Shoulder internal rotation 5 5  Shoulder external rotation 4 4  Middle trapezius    Lower trapezius    Elbow flexion 4 5  Elbow extension 3+ 4  Wrist flexion    Wrist extension 4 4  Wrist ulnar deviation    Wrist radial deviation    Wrist pronation    Wrist supination    Grip strength 99.8 deg. 96.2#   (Blank rows = not tested)  CERVICAL SPECIAL TESTS:  N/T  FUNCTIONAL TESTS:  Deferred  TREATMENT DATE: 03/02/2024                                                                                                                               Subjective:  Pt. Arrived to PT with c/o R UT Pt. Reports no difficulty sleeping.  There.ex.:  B UBE 1 min. F/b (6 min. Total).  Consistent pace/ no pain.  Good upright posture.    Standing with back to wall (shoulder flexion with dowel)- used SPC to change R hand position.  Several episodes of R UT/ radicular pain which resolves with rest/ position changes.     Nautilus: seated 20# lat. Pull downs 15x2, 20# tricep extension 15x2, 30# scap. Retraction 15x2.     Supine chin tucks 5x with static  holds.    See HEP (issued handouts)  Supine cervical isometrics (lateral flexion/ rotn.)- very gentle with 5 sec. Holds.     Manual tx.:  Supine cervical AROM (all planes) in pain tolerable range.  Gentle cervical stretches (rotn./ lateral flexion)- 5x each with holds in pain tolerable range.    Supine STM to cervical paraspinals/ UT/ posterior deltoid musculature.  R scap. Discomfort.    Seated STM with use of Hypervolt to B UT/ upper thoracic musculature.  Good tx. Tolerance.   Pt. Instructed to ice at home if any soreness/ discomfort  PATIENT EDUCATION:  Education details: posture correction/ lifting restrictions/ see HEP Person educated: Patient Education method: Explanation, Demonstration, and Handouts Education comprehension: verbalized understanding and returned demonstration  HOME EXERCISE PROGRAM: Access Code: [facility] URL: [email or web address] Date: 02/17/2024 Prepared by: Dorene Grebe  Exercises - Seated Shoulder Press Ups with Armchair  - 2 x daily - 7 x weekly - 1 sets - 5 reps - Scapular Stabilization - Full Arm Support  - 2 x daily - 7 x weekly - 1 sets - 5 reps - Doorway Pec Stretch at 90 Degrees Abduction  - 2 x daily - 7 x weekly - 1 sets - 5 reps - Standing Lean Away Doorway Stretch  - 2 x daily - 7 x weekly - 1 sets - 5 reps  Access Code: [email or web address] URL:  [email or web address] Date: 03/02/2024 Prepared by: Dorene Grebe  Exercises - Doorway Pec Stretch at 90 Degrees Abduction  - 2 x daily - 7 x weekly - 1 sets - 5 reps - Standing Lean Away Doorway Stretch  - 2 x daily - 7 x weekly - 1 sets - 5 reps - Shoulder External Rotation and Scapular Retraction with Resistance  - 1 x daily - 4 x weekly - 2 sets - 15 reps - Shoulder extension with resistance - Neutral  - 1 x daily - 4 x weekly - 2 sets - 15 reps - Supine Cervical Retraction with Towel  - 1 x daily - 7 x weekly - 2 sets - 10 reps  ASSESSMENT:  CLINICAL IMPRESSION: Pt. Presents with pain limited cervical rotation and B UE muscle weakness.  Pt. Instructed to avoid any bending, lifting or twisting at this time per MD protocol.  Good upright posture correction with addition of light UE/posture resisted ex.  PT focused on B UT muscle tightness with STM (use of Hypervolt)/ gentle stretches.  Pt. Instructed to avoid any pain provoking movement patterns/ lifting at home and focus on posture correction/ gentle stretches.  Pt. Will benefit from skilled PT services to increase cervical AROM/ B UE muscle strength to improve pain-free mobility/ return to work.    OBJECTIVE IMPAIRMENTS: decreased mobility, decreased ROM, decreased strength, hypomobility, impaired flexibility, improper body mechanics, postural dysfunction, and pain.   ACTIVITY LIMITATIONS: carrying, lifting, sleeping, locomotion level, and caring for others  PARTICIPATION LIMITATIONS: cleaning, driving, shopping, community activity, occupation, and yard work  PERSONAL FACTORS: Fitness and Past/current experiences are also affecting patient's functional outcome.   REHAB POTENTIAL: Good  CLINICAL DECISION MAKING: Stable/uncomplicated  EVALUATION COMPLEXITY: Low   GOALS: Goals reviewed with patient? Yes  SHORT TERM GOALS: Target date: 03/02/24  Pt. Independent with HEP to increase cervical AROM to [facility] and upright  posture to improve pain-free mobility.   Baseline: pt. Issued gentle stretches.  Goal status: Partially met   LONG TERM GOALS: Target date: 03/16/24  Pt. Will decrease NDI to <20% to improve pain-free functional mobility.  Baseline: 44% self-perceived moderate disability.   Goal status: INITIAL  2.  Pt. Independent with UE strengthening ex. Program to increase B UE to 5/5 MMT to improve return to work.   Baseline: no UE strengthening at this time Goal status: INITIAL  3.  Pt. Able to return to shooting/ household chores with no cervical pain or limitations.   Baseline: currently not able Goal status: INITIAL  PLAN:  PT FREQUENCY: 1-2x/week  PT DURATION: 4 weeks  PLANNED INTERVENTIONS: 97110-Therapeutic exercises, 97530- Therapeutic activity, [id number]- Neuromuscular re-education, 97535- Self Care, [id number]- Manual therapy, [id number]- Electrical stimulation (unattended), Patient/Family education, Joint mobilization, Cryotherapy, and Moist heat  PLAN FOR NEXT SESSION: Reassess HEP and UE/posture strengthening.    Cammie Mcgee, PT, DPT # [phone] 03/02/2024, 1:39 PM

## 2024-03-07 ENCOUNTER — Ambulatory Visit: Payer: Commercial Managed Care - PPO | Admitting: Physical Therapy

## 2024-03-07 DIAGNOSIS — M6281 Muscle weakness (generalized): Secondary | ICD-10-CM

## 2024-03-07 DIAGNOSIS — M542 Cervicalgia: Secondary | ICD-10-CM | POA: Diagnosis not present

## 2024-03-07 NOTE — Therapy (Signed)
 OUTPATIENT PHYSICAL THERAPY CERVICAL TREATMENT  Patient Name: Daniel Calderon MRN: [id number] DOB:August 29, 1966, 58 y.o., male Today's Date: 03/07/2024   PT End of Session - 03/07/24 1239     Visit Number 6    Number of Visits 8    Date for PT Re-Evaluation 03/16/24    PT Start Time 1239    PT Stop Time 1329    PT Time Calculation (min) 50 min             Past Medical History:  Diagnosis Date   Allergy    Seasonal   Anxiety    Arthritis    Asthma    Benign enlargement of prostate    Colon polyps    Complex sleep apnea syndrome    Depression    Elevated BP    Elevated transaminase level    Failure of erection    Fatigue    Fatty liver    Frequent headaches    GERD (gastroesophageal reflux disease)    History of kidney stones    Hyperlipidemia    Hypertension    Hypogonadism in male    Migraine    NAFL (nonalcoholic fatty liver)    Obstructive sleep apnea treated with BiPAP    Psoriasis    Sinus congestion 09/01/2017   Splenomegaly    Thoracic outlet syndrome    Left Arm   Tongue pain 02/09/2019   Past Surgical History:  Procedure Laterality Date   CERVICAL DISC ARTHROPLASTY N/A 02/03/2024   Procedure: C6-7 ARTHROPLASTY;  Surgeon: Venetia Night, MD;  Location: [facility];  Service: Neurosurgery;  Laterality: N/A;   COLONOSCOPY WITH PROPOFOL N/A 11/11/2017   Procedure: COLONOSCOPY WITH PROPOFOL;  Surgeon: Toney Reil, MD;  Location: [facility];  Service: Endoscopy;  Laterality: N/A;   COLONOSCOPY WITH PROPOFOL N/A 10/14/2022   Procedure: COLONOSCOPY WITH PROPOFOL;  Surgeon: Toney Reil, MD;  Location: [facility];  Service: Endoscopy;  Laterality: N/A;  sleep apnea   ESOPHAGOGASTRODUODENOSCOPY (EGD) WITH PROPOFOL N/A 08/25/2019   Procedure: ESOPHAGOGASTRODUODENOSCOPY (EGD) WITH PROPOFOL;  Surgeon: Toney Reil, MD;  Location: [facility];  Service: Gastroenterology;  Laterality: N/A;   KNEE ARTHROSCOPY WITH MEDIAL  MENISECTOMY Left 07/26/2018   Procedure: KNEE ARTHROSCOPY WITH PARTIAL MEDIAL MENISECTOMY;  Surgeon: Signa Kell, MD;  Location: [facility];  Service: Orthopedics;  Laterality: Left;   NASAL SEPTUM SURGERY     nerve block     2 in neck. 5 in back.   POLYPECTOMY  11/11/2017   Procedure: POLYPECTOMY;  Surgeon: Toney Reil, MD;  Location: [facility];  Service: Endoscopy;;   SCALENE NODE BIOPSY / EXCISION     THORACIC OUTLET SURGERY Left 2011   VASECTOMY     Patient Active Problem List   Diagnosis Date Noted   Right arm weakness 02/03/2024   Cervical radiculitis 02/03/2024   Hepatic steatosis 02/03/2024   Abnormal chest CT 01/08/2024   Memory impairment 11/02/2023   Anxiety 11/02/2023   Family history of ASCVD 11/02/2023   GERD (gastroesophageal reflux disease) 11/02/2023   History of colonic polyps    Family history of colon cancer    Encounter for general adult medical examination with abnormal findings 10/06/2022   Splenomegaly 10/03/2022   Left groin pain 09/30/2022   Asthma 07/21/2022   Nausea 04/08/2022   Tremor 04/08/2022   ASIS pain 04/08/2022   Left sided numbness 02/13/2020   Hyperlipidemia 12/07/2019   Radiculopathy, cervical and lumbar 12/06/2019   Abdominal  pain, epigastric    Left flank pain 08/15/2019   Elevated LFTs 08/10/2019   Hypogammaglobulinemia (HCC) 05/17/2019   Excessive daytime sleepiness 03/28/2019   Cataplexy 03/28/2019   OSA on CPAP 03/28/2019   Acute pain of left knee 04/18/2018   Cutaneous skin tags 04/18/2018   Lower extremity edema 04/18/2018   Thoracic outlet syndrome 02/19/2018   Pain in limb 02/19/2018   Muscle strain 09/01/2017   Chest pain 03/21/2017   Dyspnea on exertion 02/19/2017   Language difficulty 02/19/2017   BPH with obstruction/lower urinary tract symptoms 12/03/2015   Medication refill 09/13/2015   Erectile disorder, generalized, mild 07/26/2015   Hypogonadism in male 05/24/2015   Environmental allergies  04/13/2015   Sleep apnea 04/13/2015   Migraines 04/13/2015   Neck pain 04/13/2015   Adjustment disorder with mixed anxiety and depressed mood 04/13/2015   Psoriasis 04/13/2015   History of kidney stones 04/13/2015   Neuropathy 04/13/2015   Benign fibroma of prostate 03/24/2015   Hypertension 03/24/2015   Elevation of level of transaminase or lactic acid dehydrogenase (LDH) 03/24/2015   Fatigue 03/24/2015   PCP: Allegra Grana, FNP  REFERRING PROVIDER: Susanne Borders, PA  REFERRING DIAG:  [phone] (ICD-10-CM) - Cervical radiculopathy  R29.898 (ICD-10-CM) - Right arm weakness   THERAPY DIAG:  Cervicalgia  Muscle weakness of right arm  Rationale for Evaluation and Treatment: Rehabilitation  ONSET DATE: 02/03/24  SUBJECTIVE:                                                                                                                                                                                                         SUBJECTIVE STATEMENT: Pt. Reports chronic h/o R UT/ shoulder discomfort.  Pt. Was golfing in [place] in January 2025 and reports increase R forearm/hand numbness. S/p C6-7 arthroplasty on 02/03/24 Hand dominance: Right  PERTINENT HISTORY:  H/o L scalene surgery in 2011 (see MD notes).  Uses BiPAP and sleeps on back.  Enjoys shooting/ golfing.  RTW: 05/02/24 (no restrictions in order to RTW).    PAIN:  Are you having pain? No  PRECAUTIONS: Other: no bending, lifting or twisting  RED FLAGS: None    WEIGHT BEARING RESTRICTIONS: No  FALLS:  Has patient fallen in last 6 months? No  LIVING ENVIRONMENT: Lives with: lives with their spouse Lives in: House/apartment Stairs: Yes: External: 3 steps; on right going up Has following equipment at home: None  OCCUPATION: Supervisor Maintenance at [employer]  PLOF: Independent  PATIENT GOALS: Return to work/ shooting with no limitations/ pain.  Increase  UE muscle strength  NEXT MD VISIT:  PRN  OBJECTIVE:  Note: Objective measures were completed at Evaluation unless otherwise noted.  DIAGNOSTIC FINDINGS:  See MD notes/ imaging before and after surgery.    PATIENT SURVEYS:  NDI 44% self-perceived moderate disability.  COGNITION: Overall cognitive status: Within functional limits for tasks assessed  SENSATION: C2 positive on L  C5-6 hyper reflex. On R  POSTURE: rounded shoulders and forward head Pt. Discussed posture bracing to correct.  PALPATION: Minimal tenderness along anterior cervical incision.  Good incision healing with skin glue in place.     CERVICAL ROM:   Active ROM A/PROM (deg) eval  Flexion 23 deg.  Extension 19 deg.  Right lateral flexion 19 deg.  Left lateral flexion 12 deg.  Right rotation 47 deg.  Left rotation 45 deg.   (Blank rows = not tested)  UPPER EXTREMITY ROM:  Active ROM Right eval Left eval  Shoulder flexion WNL WNL  Shoulder extension    Shoulder abduction    Shoulder adduction    Shoulder extension    Shoulder internal rotation 80 deg. 70 deg.  Shoulder external rotation 90 deg. 90 deg.  Elbow flexion    Elbow extension    Wrist flexion    Wrist extension    Wrist ulnar deviation    Wrist radial deviation    Wrist pronation    Wrist supination     (Blank rows = not tested)  UPPER EXTREMITY MMT:  MMT Right eval Left eval  Shoulder flexion 5 4  Shoulder extension    Shoulder abduction 5 5  Shoulder adduction 4 (concordant pain) 4+      Shoulder internal rotation 5 5  Shoulder external rotation 4 4  Middle trapezius    Lower trapezius    Elbow flexion 4 5  Elbow extension 3+ 4  Wrist flexion    Wrist extension 4 4  Wrist ulnar deviation    Wrist radial deviation    Wrist pronation    Wrist supination    Grip strength 99.8 deg. 96.2#   (Blank rows = not tested)  CERVICAL SPECIAL TESTS:  N/T  FUNCTIONAL TESTS:  Deferred  TREATMENT DATE: 03/07/2024                                                                                                                               Subjective:  Pt. Arrived to PT with continued c/o R UT pain (6/10 at worst)- "like a charlie horse when it hits".  Pt. Reports a R frontal HA and pt. States weather is effecting joints/ movement.  Pt. Modified resisted B shoulder ER due to R UT discomfort.  PT recommends pt. Stop ER and focus on shoulder horizontal abduction at 90 deg. With RTB.    There.ex.:  B UBE 1 min. F/b (7 min. Total).  Consistent pace,  Good upright posture.  Discussed weekend activities/ HEP.    Standing rhomboid/ thoracic  spine stretches at stairs.    Standing RTB sh. Horizontal abduction/ sh. Flexion 20x with mirror feedback.  Nautilus: seated 30# lat. Pull downs 15x2, 30# tricep extension 15x2, 40# scap. Retraction 15x2, standing chest press 30# 15x2.       Standing chin tucks 5x with static holds.  Several episodes of R UT "nerve" pain.   Reviewed HEP   Manual tx.:  Supine cervical AROM (all planes) in pain tolerable range.  Gentle cervical stretches (rotn./ lateral flexion)- 5x each with holds in pain tolerable range.  Supine R upper limb median/ radial nerve glides.    Supine STM to cervical paraspinals/ UT/ posterior deltoid musculature.  R scap. Discomfort.    Seated STM to B UT/ upper thoracic musculature.  Good tx. Tolerance.  No Hypervolt today.    Pt. Instructed to ice at home if any soreness/ discomfort   NOT TODAY: Standing with back to wall (shoulder flexion with dowel)- used SPC to change R hand position.  Several episodes of R UT/ radicular pain which resolves with rest/ position changes.   Supine cervical isometrics (lateral flexion/ rotn.)- very gentle with 5 sec. Holds.  PATIENT EDUCATION:  Education details: posture correction/ lifting restrictions/ see HEP Person educated: Patient Education method: Explanation, Demonstration, and Handouts Education comprehension: verbalized understanding and returned  demonstration  HOME EXERCISE PROGRAM: Access Code: [facility] URL: [email or web address] Date: 02/17/2024 Prepared by: Dorene Grebe  Exercises - Seated Shoulder Press Ups with Armchair  - 2 x daily - 7 x weekly - 1 sets - 5 reps - Scapular Stabilization - Full Arm Support  - 2 x daily - 7 x weekly - 1 sets - 5 reps - Doorway Pec Stretch at 90 Degrees Abduction  - 2 x daily - 7 x weekly - 1 sets - 5 reps - Standing Lean Away Doorway Stretch  - 2 x daily - 7 x weekly - 1 sets - 5 reps  Access Code: [email or web address] URL: [email or web address] Date: 03/02/2024 Prepared by: Dorene Grebe  Exercises - Doorway Pec Stretch at 90 Degrees Abduction  - 2 x daily - 7 x weekly - 1 sets - 5 reps - Standing Lean Away Doorway Stretch  - 2 x daily - 7 x weekly - 1 sets - 5 reps - Shoulder External Rotation and Scapular Retraction with Resistance  - 1 x daily - 4 x weekly - 2 sets - 15 reps - Shoulder extension with resistance - Neutral  - 1 x daily - 4 x weekly - 2 sets - 15 reps - Supine Cervical Retraction with Towel  - 1 x daily - 7 x weekly - 2 sets - 10 reps  ASSESSMENT:  CLINICAL IMPRESSION: Pt. Presents with B UE muscle weakness and continued R UT pain with certain movement patterns.  Pt. Instructed to avoid any bending, lifting or twisting at this time per MD protocol.  Good upright posture correction with addition of light UE/posture resisted ex.  PT focused on B UT muscle tightness with STM/ gentle stretches.  Pt. Instructed to avoid any pain provoking movement patterns/ lifting at home and focus on posture correction/ gentle stretches.  Pt. Will benefit from skilled PT services to increase cervical AROM/ B UE muscle strength to improve pain-free mobility/ return to work.    OBJECTIVE IMPAIRMENTS: decreased mobility, decreased ROM, decreased strength, hypomobility, impaired flexibility, improper body mechanics, postural dysfunction, and pain.   ACTIVITY LIMITATIONS:  carrying, lifting, sleeping, locomotion level, and caring for others  PARTICIPATION LIMITATIONS: cleaning,  driving, shopping, community activity, occupation, and yard work  PERSONAL FACTORS: Fitness and Past/current experiences are also affecting patient's functional outcome.   REHAB POTENTIAL: Good  CLINICAL DECISION MAKING: Stable/uncomplicated  EVALUATION COMPLEXITY: Low   GOALS: Goals reviewed with patient? Yes  SHORT TERM GOALS: Target date: 03/02/24  Pt. Independent with HEP to increase cervical AROM to [facility] and upright posture to improve pain-free mobility.   Baseline: pt. Issued gentle stretches.  Goal status: Partially met   LONG TERM GOALS: Target date: 03/16/24  Pt. Will decrease NDI to <20% to improve pain-free functional mobility.   Baseline: 44% self-perceived moderate disability.   Goal status: INITIAL  2.  Pt. Independent with UE strengthening ex. Program to increase B UE to 5/5 MMT to improve return to work.   Baseline: no UE strengthening at this time Goal status: INITIAL  3.  Pt. Able to return to shooting/ household chores with no cervical pain or limitations.   Baseline: currently not able Goal status: INITIAL  PLAN:  PT FREQUENCY: 1-2x/week  PT DURATION: 4 weeks  PLANNED INTERVENTIONS: 97110-Therapeutic exercises, 97530- Therapeutic activity, [id number]- Neuromuscular re-education, 97535- Self Care, [id number]- Manual therapy, [id number]- Electrical stimulation (unattended), Patient/Family education, Joint mobilization, Cryotherapy, and Moist heat  PLAN FOR NEXT SESSION: Reassess HEP and UE/posture strengthening.  CHECK pt. Schedule.    Cammie Mcgee, PT, DPT # [phone] 03/07/2024, 5:36 PM

## 2024-03-08 ENCOUNTER — Other Ambulatory Visit: Payer: Self-pay

## 2024-03-09 ENCOUNTER — Ambulatory Visit: Payer: Commercial Managed Care - PPO | Admitting: Physical Therapy

## 2024-03-09 ENCOUNTER — Encounter: Payer: Self-pay | Admitting: Neurosurgery

## 2024-03-09 ENCOUNTER — Telehealth: Payer: Self-pay | Admitting: Neurosurgery

## 2024-03-09 ENCOUNTER — Other Ambulatory Visit: Payer: Self-pay | Admitting: Family Medicine

## 2024-03-09 DIAGNOSIS — Z9889 Other specified postprocedural states: Secondary | ICD-10-CM

## 2024-03-09 DIAGNOSIS — M5412 Radiculopathy, cervical region: Secondary | ICD-10-CM

## 2024-03-09 NOTE — Telephone Encounter (Signed)
 C6-7 arthroplasty on 02/03/24  Patient is calling that he had to cancel his PT appt today due to the burning and stinging sensation in his right shoulder. He is asking if Dr.Yarbrough can refer him some where to get a nerve block asap? Kathlene November the physical therapist at [facility] [place] was supposed to have reached out to [facility] regarding this issue.

## 2024-03-09 NOTE — Telephone Encounter (Signed)
 I spoke with Daniel Calderon. He reports his right arm pain feels worse since he saw Danielle on 02/16/24. He has been doing PT, but canceled it today. He states he is not worried about the left side because he had an injury in 2011. He is having stinging in his right forearm, triceps, shoulder, and scapula, but not always in all of those locations at the same time (it jumps around to those locations). Everything he had before surgery on the right is still there (in the same location), but isn't as bad or as constant.  He is also using ice. He is no longer taking pregabalin because it isn't helping and he didn't like the way it made him feel (he had a "I don't care" attitude about everything). He is taking tylenol and ibuprofen.  He is asking to get an injection ASAP so he can get back to work ASAP.

## 2024-03-10 NOTE — Telephone Encounter (Signed)
 He is s/p C6-C7 arthroplasty on 02/03/24.   Reviewed with Danielle. She had discussed with Dr. Myer Haff and okay to do cervical ESI. Will put in fast track orders for right C6-C7 ESI with Dr. Cherylann Ratel.   Please let him know referral was placed. Dr. Garnett Farm office should call him, but he can call [phone].

## 2024-03-10 NOTE — Telephone Encounter (Signed)
 Patient called to follow up on his referral. He also states that he is having numbness in his right ring and pinky finger that started today.

## 2024-03-10 NOTE — Telephone Encounter (Signed)
 I called patient and notified him that the referral had been placed for ESI. He noted the office number for Dr. Cherylann Ratel on paper for his reference.

## 2024-03-11 ENCOUNTER — Other Ambulatory Visit: Payer: Self-pay

## 2024-03-14 ENCOUNTER — Encounter: Payer: Self-pay | Admitting: Physical Therapy

## 2024-03-14 ENCOUNTER — Ambulatory Visit: Admitting: Physical Therapy

## 2024-03-14 ENCOUNTER — Other Ambulatory Visit: Payer: Self-pay

## 2024-03-14 ENCOUNTER — Telehealth: Payer: Self-pay | Admitting: Neurosurgery

## 2024-03-14 NOTE — Telephone Encounter (Signed)
 I notified Daniel Calderon that I confirmed with 2 of our providers in office (Dr Katrinka Blazing and Nehemiah Settle) that it is very unlikely that he would cause damage to his recent surgery.

## 2024-03-14 NOTE — Telephone Encounter (Signed)
 C6-7 arthroplasty on 02/03/24   Patient's wife is calling to let our office know that the patient has a stomach bug and would like to know if there is any chance of causing damage from the patient's recent surgery with him vomiting. Please advise.

## 2024-03-15 ENCOUNTER — Encounter: Payer: Commercial Managed Care - PPO | Admitting: Neurosurgery

## 2024-03-16 ENCOUNTER — Ambulatory Visit
Admission: RE | Admit: 2024-03-16 | Discharge: 2024-03-16 | Disposition: A | Discharge status: Home / Self Care | Source: Ambulatory Visit | Attending: Neurosurgery | Admitting: Neurosurgery

## 2024-03-16 ENCOUNTER — Ambulatory Visit: Admitting: Physical Therapy

## 2024-03-16 ENCOUNTER — Other Ambulatory Visit: Payer: Self-pay

## 2024-03-16 DIAGNOSIS — M5412 Radiculopathy, cervical region: Secondary | ICD-10-CM

## 2024-03-16 DIAGNOSIS — M47812 Spondylosis without myelopathy or radiculopathy, cervical region: Secondary | ICD-10-CM | POA: Diagnosis not present

## 2024-03-16 NOTE — Progress Notes (Signed)
 Specialty Pharmacy Refill Coordination Note  Daniel Calderon is a 58 y.o. male contacted today regarding refills of specialty medication(s) Deucravacitinib (Sotyktu)   Patient requested Delivery   Delivery date: 03/30/24   Verified address: [address]   Medication will be filled on 04.08.25.

## 2024-03-17 ENCOUNTER — Ambulatory Visit (INDEPENDENT_AMBULATORY_CARE_PROVIDER_SITE_OTHER): Admitting: Neurosurgery

## 2024-03-17 ENCOUNTER — Encounter: Payer: Self-pay | Admitting: Neurosurgery

## 2024-03-17 VITALS — BP 118/78 | Temp 97.9°F | Ht 74.0 in | Wt 216.0 lb

## 2024-03-17 DIAGNOSIS — M5412 Radiculopathy, cervical region: Secondary | ICD-10-CM

## 2024-03-17 DIAGNOSIS — Z9889 Other specified postprocedural states: Secondary | ICD-10-CM

## 2024-03-17 NOTE — Progress Notes (Signed)
   REFERRING PHYSICIAN:  Allegra Grana, Fnp [address],  [place] [address]  DOS: 02/03/24 C6-7 arthroplasty  HISTORY OF PRESENT ILLNESS:  03/17/24 Mr. Overholt follows up today for 6 week post-op visit.  He has had some improvement of his neck mobility and stiffness as well as resolution of the muscle spasms he was having in his arm.  He does continue to have some nervelike pain throughout his right arm.  He is scheduled for an injection with Dr. Cherylann Ratel on 03/24/2024. Overall he is very pleased with his surgical outcome thus far.  02/16/24 [name] is about 2 weeks status post cervical arthroplasty. Overall, he is doing relatively well after his recent surgery.  He reports significant improvement of his right rating arm pain however he is having some more discomfort into his left scalp and left chest similar to the symptoms he had prior to his scalene surgery.  He is also having some hoarseness but is largely tolerating normal diet.  He is eager to start physical therapy as he still having some weakness particularly in his right tricep.  He is not currently taking pain medication.  PHYSICAL EXAMINATION:  NEUROLOGICAL:  General: In no acute distress.   Awake, alert, oriented to person, place, and time.  Pupils equal round and reactive to light.  Facial tone is symmetric.  Tongue protrusion is midline.  There is no pronator drift.   Strength: Side Biceps Triceps Deltoid Interossei Grip Wrist Ext. Wrist Flex.  R 5 4- 5 5 4 5 5   L 5 5 5 5 5 5 5     Incision c/d/I and healing well  Imaging:  No interval imaging to review  Assessment / Plan: DAYMEON FISCHMAN is doing relatively well after recent cervical arthroplasty. He will continue with injection as planned so long as he remains symptomatic. He would like to return to work on 4/28 and states he can abide by restrictions.  We discussed activity escalation and I have advised the patient to increase up to 25  pounds until 12 weeks after surgery.  After 12 weeks post-op, the patient advised to increase activity as tolerated. he will return to clinic in approximately 6 weeks to see Dr. Myer Haff or sooner should he have any questions or concerns.   Advised to contact the office if any questions or concerns arise.   Manning Charity PA-C Dept of Neurosurgery

## 2024-03-22 ENCOUNTER — Ambulatory Visit: Attending: Neurosurgery | Admitting: Physical Therapy

## 2024-03-24 ENCOUNTER — Ambulatory Visit
Attending: Student in an Organized Health Care Education/Training Program | Admitting: Student in an Organized Health Care Education/Training Program

## 2024-03-24 ENCOUNTER — Encounter: Payer: Self-pay | Admitting: Student in an Organized Health Care Education/Training Program

## 2024-03-24 ENCOUNTER — Ambulatory Visit: Admitting: Physical Therapy

## 2024-03-24 VITALS — BP 118/77 | HR 80 | Temp 98.1°F | Resp 16 | Ht 74.0 in | Wt 202.0 lb

## 2024-03-24 DIAGNOSIS — M5412 Radiculopathy, cervical region: Secondary | ICD-10-CM | POA: Diagnosis not present

## 2024-03-24 DIAGNOSIS — Z9889 Other specified postprocedural states: Secondary | ICD-10-CM | POA: Insufficient documentation

## 2024-03-24 DIAGNOSIS — M5010 Cervical disc disorder with radiculopathy, unspecified cervical region: Secondary | ICD-10-CM | POA: Insufficient documentation

## 2024-03-24 NOTE — Progress Notes (Signed)
 PROVIDER NOTE: Interpretation of information contained herein should be left to medically-trained personnel. Specific patient instructions are provided elsewhere under "Patient Instructions" section of medical record. This document was created in part using AI and STT-dictation technology, any transcriptional errors that may result from this process are unintentional.  Patient: Daniel Calderon  Service: E/M Encounter  PCP: Allegra Grana, FNP  DOB: 1966/10/30  DOS: 03/24/2024  Provider: Edward Jolly, MD  MRN: [id number]  Delivery: Face-to-face  Specialty: Interventional Pain Management  Type: New Patient  Setting: Ambulatory outpatient facility  Specialty designation: 09  Referring Prov.: Drake Leach, PA-C  Location: Outpatient office facility     Primary Reason(s) for Visit: Encounter for initial evaluation of one or more chronic problems (new to examiner) potentially causing chronic pain, and posing a threat to normal musculoskeletal function. (Level of risk: High) CC: Neck Pain (Right side s/p discectomy  of C6,7)  HPI  Daniel Calderon is a 58 y.o. year old, male patient, who comes for the first time to our practice referred by Drake Leach, PA-C for our initial evaluation of his chronic pain. He has Benign fibroma of prostate; Hypertension; Elevation of level of transaminase or lactic acid dehydrogenase (LDH); Fatigue; Environmental allergies; Sleep apnea; Migraines; Neck pain; Adjustment disorder with mixed anxiety and depressed mood; Psoriasis; History of kidney stones; Neuropathy; Hypogonadism in male; Erectile disorder, generalized, mild; Medication refill; BPH with obstruction/lower urinary tract symptoms; Dyspnea on exertion; Language difficulty; Chest pain; Muscle strain; Thoracic outlet syndrome; Pain in limb; Acute pain of left knee; Cutaneous skin tags; Lower extremity edema; Excessive daytime sleepiness; Cataplexy; OSA on CPAP; Hypogammaglobulinemia (HCC); Elevated LFTs; Left flank pain;  Abdominal pain, epigastric; Radiculopathy, cervical and lumbar; Hyperlipidemia; Left sided numbness; Nausea; Tremor; ASIS pain; Asthma; Left groin pain; Splenomegaly; Encounter for general adult medical examination with abnormal findings; History of colonic polyps; Family history of colon cancer; Memory impairment; Anxiety; Family history of ASCVD; GERD (gastroesophageal reflux disease); Abnormal chest CT; Right arm weakness; Cervical radicular pain; Hepatic steatosis; Cervical disc disorder with radiculopathy of cervical region; and Hx of cervical spine surgery on their problem list. Today he comes in for evaluation of his Neck Pain (Right side s/p discectomy  of C6,7)  Pain Assessment: Location: Right Neck Radiating: going into right shoulder and down right arm to the hand and fingers Onset: More than a month ago Duration: Chronic pain Quality: Discomfort, Other (Comment), Cramping (stings) Severity: 5  (currently okay but at any minute it can shoot to 10)/10 (subjective, self-reported pain score)  Effect on ADL: keeps him on guard anticipating the pain in the right arm. Timing: Intermittent Modifying factors: s/p surgical discectomy, pain is better since but continues to have intermittent pain down right arm. BP: 118/77  HR: 80  Onset and Duration: Present longer than 3 months Cause of pain: Unknown Severity: Getting better, Getting worse, NAS-11 at its worse: 8/10, NAS-11 now: 3/10, and NAS-11 on the average: 2/10 Timing: Not influenced by the time of the day Aggravating Factors:  none listed Alleviating Factors:  none listed Associated Problems: Spasms, Tingling, and Pain that wakes patient up Quality of Pain: Sharp, Shooting, Stabbing, Throbbing, and Tingling Previous Examinations or Tests: MRI scan and Nerve block Previous Treatments: The patient denies unsure   Discussed the use of AI scribe software for clinical note transcription with the patient, who gave verbal consent to  proceed.  History of Present Illness   Daniel Calderon is a 58 year old male with cervical  disc herniation who presents with persistent right arm pain and pulsations. He is accompanied by his wife. He was referred by Dr. Myer Haff for evaluation of persistent pain following cervical disc surgery.  He experiences persistent right arm pain and pulsations following cervical disc surgery at C6-7 in February 2025. The pain is described as pulsating, originating from behind the neck and shoulder blades, and radiating down the right arm. It is not constant but occurs suddenly, described as 'whammo', with shooting pain down the right arm. Initial management included massage and chiropractic care, but the pain became unmanageable during a trip to [facility], where it did not respond to heat or medication.  He underwent an MRI and received a temporary nerve block, which provided relief for two weeks. A second opinion with Dr. Myer Haff led to cervical disc replacement surgery. Post-surgery, there is improvement in some movements, but intermittent severe pain in the right arm persists. Physical therapy was attempted but worsened symptoms, leading to its discontinuation. He describes a persistent knot in his back that he cannot alleviate. He has a history of thoracic outlet syndrome, which previously required a nerve block at [facility]. The first injection was effective, but a subsequent injection "hit the wrong nerve bundle", causing increased sensitivity in the back of his head he mentions.   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 He plans to return to work soon and has a family trip to [place] planned, which he hopes to attend despite his limitations. He is unable to ride attractions but intends to participate in the trip for family engagement.        Meds   Current Outpatient Medications:    amLODipine (NORVASC) 10 MG tablet, Take 1 tablet  (10 mg total) by mouth daily., Disp: 90 tablet, Rfl: 3   cyanocobalamin (VITAMIN B12) 1000 MCG tablet, Take 1,000 mcg by mouth daily., Disp: , Rfl:    Deucravacitinib (SOTYKTU) 6 MG TABS, Take one tablet by mouth once daily., Disp: 30 tablet, Rfl: 5   omeprazole (PRILOSEC) 20 MG capsule, Take 1 capsule (20 mg total) by mouth daily., Disp: 90 capsule, Rfl: 3   sertraline (ZOLOFT) 100 MG tablet, Take 1 tablet (100 mg total) by mouth daily., Disp: 90 tablet, Rfl: 3  Current Facility-Administered Medications:    phenylephrine 100 mcg/mL CONC. dilution injection for priapism ([facility] USE ONLY), 200 mcg, Intracavernosal, Once,   Imaging Review  Cervical Imaging: Cervical MR wo contrast: Results for orders placed during the hospital encounter of 12/06/19  MR CERVICAL SPINE WO CONTRAST  Narrative CLINICAL DATA:  Neck pain, acute  EXAM: MRI CERVICAL SPINE WITHOUT CONTRAST  TECHNIQUE: Multiplanar, multisequence MR imaging of the cervical spine was performed. No intravenous contrast was administered.  COMPARISON:  None.  FINDINGS: Alignment: There is straightening of the normal cervical lordosis.  Vertebrae: The vertebral body heights are well maintained. No fracture, marrow edema,or pathologic marrow infiltration.  Cord: Normal signal and morphology.  Posterior Fossa, vertebral arteries, paraspinal tissues:  The visualized portion of the posterior fossa is unremarkable. Normal flow voids seen within the vertebral arteries. The paraspinal soft tissues are unremarkable.  Disc levels:  C1-C2: Atlanto-axial junction is normal, without canal narrowing  C2-C3: There is a minimal disc osteophyte complex causes mild left-sided neural foraminal narrowing.  C3-C4: There is a disc osteophyte complex and uncovertebral osteophytes which causes moderate left and mild right neural foraminal narrowing. There  is mild effacement anterior thecal sac.  C4-C5: There is a  disc osteophyte complex and uncovertebral osteophytes which causes mild to moderate left neural foraminal narrowing.  C5-C6: There is a disc osteophyte complex and uncovertebral osteophytes which causes severe left and moderate to severe right neural foraminal narrowing. There is mild central canal stenosis.  C6-C7: There is a broad-based disc bulge with a right lateral recess disc protrusion with an annular fissure which contacts and impinges the right C7 nerve root. There is also uncovertebral osteophytes. Moderate to severe bilateral neural foraminal narrowing is seen and mild central canal stenosis.  C7-T1: No significant spinal canal or neural foraminal narrowing  IMPRESSION: Cervical spine straightening.  Cervical spine spondylosis most notable at C6-7 with a right lateral recess disc protrusion with an annular fissure which contacts and impinges the right C7 nerve root. There is also moderate to severe bilateral neural foraminal narrowing and mild central canal stenosis.   Electronically Signed By: Jonna Clark M.D. On: 12/07/2019 12:46    DG Cervical Spine 2 or 3 views  Narrative CLINICAL DATA:  Cervical arthroplasty  EXAM: CERVICAL SPINE - 2-3 VIEW  COMPARISON:  February 03 2024  FINDINGS: There is no evidence of cervical spine fracture or prevertebral soft tissue swelling. Alignment is normal. Intervertebral spacer is identified at C6-7 without malalignment. Mild anterior spurring is identified at C5 and C6. No other significant bone abnormalities are identified.  IMPRESSION: Intervertebral spacer is identified at C6-7 without malalignment.   Electronically Signed By: Sherian Rein M.D. On: 03/18/2024 14:45   Lumbosacral Imaging: Lumbar MR wo contrast: Results for orders placed during the hospital encounter of 03/10/18  MR Lumbar Spine Wo Contrast  Narrative CLINICAL DATA:  Initial evaluation for bilateral leg weakness for 1 year. History of  prior LP in 2012.  EXAM: MRI LUMBAR SPINE WITHOUT CONTRAST  TECHNIQUE: Multiplanar, multisequence MR imaging of the lumbar spine was performed. No intravenous contrast was administered.  COMPARISON:  None available.  FINDINGS: Segmentation: Normal segmentation. Lowest well-formed disc labeled the L5-S1 level.  Alignment: Chronic 4 mm retrolisthesis of L5 on S1. Straightening of the normal lumbar lordosis.  Vertebrae: Vertebral body heights are maintained without evidence for acute or chronic fracture. Chronic reactive endplate changes present about the L5-S1 interspace. Underlying bone marrow signal intensity within normal limits. Subcentimeter hemangioma noted within the L1 vertebral body. No other discrete or worrisome osseous lesions. No abnormal marrow edema.  Conus medullaris and cauda equina: Conus extends to the L1 level. Conus and cauda equina appear normal.  Paraspinal and other soft tissues: Paraspinous soft tissues within normal limits. Visualized visceral structures within normal limits.  Disc levels:  L1-2:  Unremarkable.  L2-3: Mild annular disc bulge, slightly eccentric to the right, closely approximating the right L2 nerve root without neural impingement (series 5, image 18). Mild facet and ligament flavum hypertrophy. No canal or neural foraminal stenosis. No impingement.  L3-4: Mild diffuse disc bulge with disc desiccation. Superimposed shallow left foraminal disc protrusion closely approximates the exiting left L3 nerve root without frank neural impingement (series 5, image 25). Mild to moderate facet ligament flavum hypertrophy. No significant canal stenosis. Mild left L3 foraminal narrowing.  L4-5: Mild disc bulge with disc desiccation. Mild facet ligament flavum hypertrophy. Trace bilateral joint effusions. No canal or foraminal stenosis.  L5-S1: Chronic 4 mm retrolisthesis with diffuse disc bulge, disc desiccation, and chronic reactive  endplate changes. Mild facet hypertrophy. Trace bilateral joint effusions. No significant canal or lateral  recess stenosis. Mild to moderate bilateral L5 foraminal narrowing, right worse than left.  IMPRESSION: 1. Shallow left foraminal disc protrusion at L3-4, contacting and potentially irritating the exiting left L3 nerve root. 2. Chronic degenerative disc disease with reactive endplate changes at L5-S1 with resultant mild to moderate bilateral L5 foraminal narrowing, right worse than left. 3. Right eccentric disc bulge at L2-3, closely approximating the exiting right L2 nerve root without impingement.   Electronically Signed By: Rise Mu M.D. On: 03/11/2018 06:45 MR HIP RIGHT WO CONTRAST  Narrative CLINICAL DATA:  Right hip pain for 7 years.  EXAM: MR OF THE RIGHT HIP WITHOUT CONTRAST  TECHNIQUE: Multiplanar, multisequence MR imaging was performed. No intravenous contrast was administered.  COMPARISON:  CT pelvis 09/30/2022  FINDINGS: Bones: Mild spurring of both femoral heads and acetabula. Mildly aspherical femoral head morphology may predispose to cam type femoroacetabular impingement.  Degenerative endplate findings and loss of disc space L5-S1 compatible with degenerative disc disease. Spurring at L5-S1 compatible with spondylosis.  Articular cartilage and labrum  Articular cartilage:  Unremarkable  Labrum:  No paralabral cyst.  No gross labral tear identified.  Joint or bursal effusion  Joint effusion:  Absent  Bursae: No regional bursitis.  Muscles and tendons  Muscles and tendons:  Unremarkable  Other findings  Miscellaneous:   No supplemental non-categorized findings.  IMPRESSION: 1. Mild spurring of both femoral heads and acetabula. Mildly aspherical femoral head morphology may predispose to cam type femoroacetabular impingement. 2. Degenerative disc disease and spondylosis at L5-S1.   Electronically Signed By: Gaylyn Rong M.D. On: 11/18/2023 14:59   Narrative CLINICAL DATA:  Medial left knee pain for 1-2 years which has worsened over the past 6 months. No known injury.  EXAM: MRI OF THE LEFT KNEE WITHOUT CONTRAST  TECHNIQUE: Multiplanar, multisequence MR imaging of the knee was performed. No intravenous contrast was administered.  COMPARISON:  None.  FINDINGS: MENISCI  Medial meniscus: Large horizontal tear in the posterior horn reaches the meniscal undersurface and extends into the posterior body of the meniscus. No displaced fragment.  Lateral meniscus:  Intact.  LIGAMENTS  Cruciates:  Intact.  Collaterals:  Intact.  CARTILAGE  Patellofemoral:  Normal.  Medial:  Normal.  Lateral:  Normal.  Joint:  Trace amount of joint fluid.  Popliteal Fossa:  No Baker's cyst.  Extensor Mechanism:  Intact.  Bones:  Normal marrow signal throughout.  Other: None.  IMPRESSION: Large horizontal tear posterior horn and posterior body of the medial meniscus without displaced fragment. The exam is otherwise negative.   Electronically Signed By: Drusilla Kanner M.D. On: 07/15/2018 09:31   MR KNEE RIGHT WO CONTRAST  Narrative CLINICAL DATA:  Right knee pain.  Popping and locking 2 years.  EXAM: MRI OF THE RIGHT KNEE WITHOUT CONTRAST  TECHNIQUE: Multiplanar, multisequence MR imaging of the knee was performed. No intravenous contrast was administered.  COMPARISON:  None.  FINDINGS: MENISCI  Medial meniscus:  Intact.  Lateral meniscus: Subtle irregularity along the undersurface of the posterior horn of the lateral meniscus towards the free edge on a single image (image 14/series 7) likely artifactual versus less likely a small tear.  LIGAMENTS  Cruciates:  Intact ACL and PCL.  Collaterals: Medial collateral ligament is intact. Lateral collateral ligament complex is intact.  CARTILAGE  Patellofemoral:  No chondral defect.  Medial:  No chondral  defect.  Lateral:  No chondral defect.  Joint: No joint effusion. Minimal edema in Hoffa's fat. No plical thickening.  Popliteal Fossa: No Baker cyst. Intact popliteus tendon. Small amount of fluid in the popliteus tendon sheath.  Extensor Mechanism: Intact quadriceps tendon. Intact patellar tendon. Intact medial patellar retinaculum. Intact lateral patellar retinaculum. Intact MPFL.  Bones:  No acute osseous abnormality.  No aggressive osseous lesion.  Other: Muscles are normal. No muscle atrophy. No fluid collection or hematoma.  IMPRESSION: 1. Subtle irregularity along the undersurface of the posterior horn of the lateral meniscus towards the free edge on a single image (image 14/series 7) likely artifactual versus less likely a small tear. 2. Otherwise no meniscal or ligamentous injury of knee.   Electronically Signed By: Elige Ko On: 11/12/2018 09:59    DG Knee Complete 4 Views Left  Narrative CLINICAL DATA:  Left knee pain for 3-4 weeks. No known injury. Initial encounter.  EXAM: LEFT KNEE - COMPLETE 4+ VIEW  COMPARISON:  None.  FINDINGS: No evidence of fracture, dislocation, or joint effusion. No evidence of arthropathy or other focal bone abnormality. Soft tissues are unremarkable.  IMPRESSION: Negative exam.   Electronically Signed By: Drusilla Kanner M.D. On: 04/16/2018 15:53   Complexity Note: Imaging results reviewed.                         ROS  Cardiovascular: High blood pressure and Chest pain Pulmonary or Respiratory: Snoring  and Temporary stoppage of breathing during sleep Neurological: No reported neurological signs or symptoms such as seizures, abnormal skin sensations, urinary and/or fecal incontinence, being born with an abnormal open spine and/or a tethered spinal cord Psychological-Psychiatric: No reported psychological or psychiatric signs or symptoms such as difficulty sleeping, anxiety, depression, delusions or  hallucinations (schizophrenial), mood swings (bipolar disorders) or suicidal ideations or attempts Gastrointestinal: Reflux or heatburn Genitourinary: Passing kidney stones Hematological: No reported hematological signs or symptoms such as prolonged bleeding, low or poor functioning platelets, bruising or bleeding easily, hereditary bleeding problems, low energy levels due to low hemoglobin or being anemic Endocrine: No reported endocrine signs or symptoms such as high or low blood sugar, rapid heart rate due to high thyroid levels, obesity or weight gain due to slow thyroid or thyroid disease Rheumatologic: No reported rheumatological signs and symptoms such as fatigue, joint pain, tenderness, swelling, redness, heat, stiffness, decreased range of motion, with or without associated rash Musculoskeletal: Negative for myasthenia gravis, muscular dystrophy, multiple sclerosis or malignant hyperthermia Work History: Working full time  Allergies  Mr. Alligood is allergic to erythromycin, dexamethasone, keflex [cephalexin], percocet [oxycodone-acetaminophen], prednisone, and gabapentin.  Laboratory Chemistry Profile   Renal Lab Results  Component Value Date   BUN 22 (H) 01/18/2024   CREATININE 0.90 01/18/2024   BCR 16 08/27/2022   GFR 74.95 11/02/2023   GFRAA >60 04/25/2020   GFRNONAA >60 01/18/2024   SPECGRAV 1.010 08/15/2019   PHUR 6.0 08/15/2019   PROTEINUR Negative 08/15/2019     Electrolytes Lab Results  Component Value Date   NA 137 01/18/2024   K 3.9 01/18/2024   CL 103 01/18/2024   CALCIUM 9.7 01/18/2024     Hepatic Lab Results  Component Value Date   AST 38 (H) 11/02/2023   ALT 65 (H) 11/02/2023   ALBUMIN 5.2 11/02/2023   ALKPHOS 84 11/02/2023   LIPASE 32 08/18/2019     ID Lab Results  Component Value Date   HIV NON REACTIVE 12/06/2019   SARSCOV2NAA NEGATIVE 07/31/2020   STAPHAUREUS NEGATIVE 01/29/2024   MRSAPCR NEGATIVE 01/29/2024   HCVAB <0.1  10/27/2017      Bone Lab Results  Component Value Date   TESTOSTERONE 319 07/08/2023     Endocrine Lab Results  Component Value Date   GLUCOSE 99 01/18/2024   GLUCOSEU NEGATIVE 09/30/2022   HGBA1C 4.9 11/02/2023   TSH 1.53 04/08/2022   TESTOSTERONE 319 07/08/2023   CRTSLPL 10.3 12/30/2017     Neuropathy Lab Results  Component Value Date   VITAMINB12 246 11/02/2023   FOLATE 10.7 11/02/2023   HGBA1C 4.9 11/02/2023   HIV NON REACTIVE 12/06/2019     CNS No results found for: "COLORCSF", "APPEARCSF", "RBCCOUNTCSF", "WBCCSF", "POLYSCSF", "LYMPHSCSF", "EOSCSF", "PROTEINCSF", "GLUCCSF", "JCVIRUS", "CSFOLI", "IGGCSF", "LABACHR", "ACETBL"   Inflammation (CRP: Acute  ESR: Chronic) Lab Results  Component Value Date   LATICACIDVEN 1.3 12/06/2019     Rheumatology No results found for: "RF", "ANA", "LABURIC", "URICUR", "LYMEIGGIGMAB", "LYMEABIGMQN", "HLAB27"   Coagulation Lab Results  Component Value Date   INR 1.0 12/06/2019   LABPROT 13.2 12/06/2019   APTT 27 12/06/2019   PLT 178.0 11/02/2023     Cardiovascular Lab Results  Component Value Date   BNP 22.0 02/19/2017   TROPONINI <0.03 03/21/2019   HGB 16.0 11/02/2023   HCT 45.7 11/02/2023     Screening Lab Results  Component Value Date   SARSCOV2NAA NEGATIVE 07/31/2020   COVIDSOURCE NASOPHARYNGEAL 05/27/2019   STAPHAUREUS NEGATIVE 01/29/2024   MRSAPCR NEGATIVE 01/29/2024   HCVAB <0.1 10/27/2017   HIV NON REACTIVE 12/06/2019     Cancer No results found for: "CEA", "CA125", "LABCA2"   Allergens No results found for: "ALMOND", "APPLE", "ASPARAGUS", "AVOCADO", "BANANA", "BARLEY", "BASIL", "BAYLEAF", "GREENBEAN", "LIMABEAN", "WHITEBEAN", "BEEFIGE", "REDBEET", "BLUEBERRY", "BROCCOLI", "CABBAGE", "MELON", "CARROT", "CASEIN", "CASHEWNUT", "CAULIFLOWER", "CELERY"     Note: Lab results reviewed.  PFSH  Drug: Mr. Wierzba  reports no history of drug use. Alcohol:  reports current alcohol use of about 10.0 standard drinks of alcohol  per week. Tobacco:  reports that he has never smoked. He has never used smokeless tobacco. Medical:  has a past medical history of Allergy, Anxiety, Arthritis, Asthma, Benign enlargement of prostate, Colon polyps, Complex sleep apnea syndrome, Depression, Elevated BP, Elevated transaminase level, Failure of erection, Fatigue, Fatty liver, Frequent headaches, GERD (gastroesophageal reflux disease), History of kidney stones, Hyperlipidemia, Hypertension, Hypogonadism in male, Migraine, NAFL (nonalcoholic fatty liver), Obstructive sleep apnea treated with BiPAP, Psoriasis, Sinus congestion (09/01/2017), Splenomegaly, Thoracic outlet syndrome, and Tongue pain (02/09/2019). Family: family history includes Colon cancer (age of onset: 93) in his sister; Diabetes in his brother; Healthy in his daughter and son; Hearing loss in his maternal grandmother and mother; Heart attack in his paternal uncle; Heart attack (age of onset: 34) in his mother; Heart disease in an other family member; Hypertension in his mother; Skin cancer in his father; Stroke in his paternal uncle; Stroke (age of onset: 7) in his mother.  Past Surgical History:  Procedure Laterality Date   CERVICAL DISC ARTHROPLASTY N/A 02/03/2024   Procedure: C6-7 ARTHROPLASTY;  Surgeon: Venetia Night, MD;  Location: [facility] OR[facility];  Service: Neurosurgery;  Laterality: N/A;   COLONOSCOPY WITH PROPOFOL N/A 11/11/2017   Procedure: COLONOSCOPY WITH PROPOFOL;  Surgeon: Toney Reil, MD;  Location: [facility];  Service: Endoscopy;  Laterality: N/A;   COLONOSCOPY WITH PROPOFOL N/A 10/14/2022   Procedure: COLONOSCOPY WITH PROPOFOL;  Surgeon: Toney Reil, MD;  Location: [facility];  Service: Endoscopy;  Laterality: N/A;  sleep apnea   ESOPHAGOGASTRODUODENOSCOPY (EGD) WITH PROPOFOL N/A 08/25/2019   Procedure: ESOPHAGOGASTRODUODENOSCOPY (  EGD) WITH PROPOFOL;  Surgeon: Toney Reil, MD;  Location: [facility];  Service:  Gastroenterology;  Laterality: N/A;   KNEE ARTHROSCOPY WITH MEDIAL MENISECTOMY Left 07/26/2018   Procedure: KNEE ARTHROSCOPY WITH PARTIAL MEDIAL MENISECTOMY;  Surgeon: Signa Kell, MD;  Location: [facility];  Service: Orthopedics;  Laterality: Left;   NASAL SEPTUM SURGERY     nerve block     2 in neck. 5 in back.   POLYPECTOMY  11/11/2017   Procedure: POLYPECTOMY;  Surgeon: Toney Reil, MD;  Location: [facility];  Service: Endoscopy;;   SCALENE NODE BIOPSY / EXCISION     THORACIC OUTLET SURGERY Left 2011   VASECTOMY     Active Ambulatory Problems    Diagnosis Date Noted   Benign fibroma of prostate 03/24/2015   Hypertension 03/24/2015   Elevation of level of transaminase or lactic acid dehydrogenase (LDH) 03/24/2015   Fatigue 03/24/2015   Environmental allergies 04/13/2015   Sleep apnea 04/13/2015   Migraines 04/13/2015   Neck pain 04/13/2015   Adjustment disorder with mixed anxiety and depressed mood 04/13/2015   Psoriasis 04/13/2015   History of kidney stones 04/13/2015   Neuropathy 04/13/2015   Hypogonadism in male 05/24/2015   Erectile disorder, generalized, mild 07/26/2015   Medication refill 09/13/2015   BPH with obstruction/lower urinary tract symptoms 12/03/2015   Dyspnea on exertion 02/19/2017   Language difficulty 02/19/2017   Chest pain 03/21/2017   Muscle strain 09/01/2017   Thoracic outlet syndrome 02/19/2018   Pain in limb 02/19/2018   Acute pain of left knee 04/18/2018   Cutaneous skin tags 04/18/2018   Lower extremity edema 04/18/2018   Excessive daytime sleepiness 03/28/2019   Cataplexy 03/28/2019   OSA on CPAP 03/28/2019   Hypogammaglobulinemia (HCC) 05/17/2019   Elevated LFTs 08/10/2019   Left flank pain 08/15/2019   Abdominal pain, epigastric    Radiculopathy, cervical and lumbar 12/06/2019   Hyperlipidemia 12/07/2019   Left sided numbness 02/13/2020   Nausea 04/08/2022   Tremor 04/08/2022   ASIS pain 04/08/2022   Asthma  07/21/2022   Left groin pain 09/30/2022   Splenomegaly 10/03/2022   Encounter for general adult medical examination with abnormal findings 10/06/2022   History of colonic polyps    Family history of colon cancer    Memory impairment 11/02/2023   Anxiety 11/02/2023   Family history of ASCVD 11/02/2023   GERD (gastroesophageal reflux disease) 11/02/2023   Abnormal chest CT 01/08/2024   Right arm weakness 02/03/2024   Cervical radicular pain 02/03/2024   Hepatic steatosis 02/03/2024   Cervical disc disorder with radiculopathy of cervical region 03/24/2024   Hx of cervical spine surgery 03/24/2024   Resolved Ambulatory Problems    Diagnosis Date Noted   Failure of erection 03/24/2015   Encounter to establish care 04/13/2015   Sinusitis, acute maxillary 07/30/2016   Sinus congestion 09/01/2017   Rectal bleeding    Tongue pain 02/09/2019   Stroke-like symptoms s/p tPA 12/07/2019   Past Medical History:  Diagnosis Date   Allergy    Arthritis    Benign enlargement of prostate    Colon polyps    Complex sleep apnea syndrome    Depression    Elevated BP    Elevated transaminase level    Fatty liver    Frequent headaches    Migraine    NAFL (nonalcoholic fatty liver)    Obstructive sleep apnea treated with BiPAP    Constitutional Exam  General appearance: Well nourished, well developed,  and well hydrated. In no apparent acute distress Vitals:   03/24/24 0811  BP: 118/77  Pulse: 80  Resp: 16  Temp: 98.1 F (36.7 C)  TempSrc: Temporal  SpO2: 99%  Weight: 202 lb (91.6 kg)  Height: 6\' 2"  (1.88 m)   BMI Assessment: Estimated body mass index is 25.94 kg/m as calculated from the following:   Height as of this encounter: 6\' 2"  (1.88 m).   Weight as of this encounter: 202 lb (91.6 kg).  BMI interpretation table: BMI level Category Range association with higher incidence of chronic pain  <18 kg/m2 Underweight   18.5-24.9 kg/m2 Ideal body weight   25-29.9 kg/m2 Overweight  Increased incidence by 20%  30-34.9 kg/m2 Obese (Class I) Increased incidence by 68%  35-39.9 kg/m2 Severe obesity (Class II) Increased incidence by 136%  >40 kg/m2 Extreme obesity (Class III) Increased incidence by 254%   Patient's current BMI Ideal Body weight  Body mass index is 25.94 kg/m. Ideal body weight: 82.2 kg (181 lb 3.5 oz) Adjusted ideal body weight: 86 kg (189 lb 8.5 oz)   BMI Readings from Last 4 Encounters:  03/24/24 25.94 kg/m  03/17/24 27.73 kg/m  02/16/24 27.73 kg/m  02/03/24 27.73 kg/m   Wt Readings from Last 4 Encounters:  03/24/24 202 lb (91.6 kg)  03/17/24 216 lb (98 kg)  02/16/24 216 lb (98 kg)  02/03/24 216 lb (98 kg)    Psych/Mental status: Alert, oriented x 3 (person, place, & time)       Eyes: PERLA Respiratory: No evidence of acute respiratory distress  Cervical Spine Area Exam  Skin & Axial Inspection: Well healed scar from previous spine surgery detected Alignment: Symmetrical Functional ROM: Unrestricted ROM      Stability: No instability detected Muscle Tone/Strength: Functionally intact. No obvious neuro-muscular anomalies detected. Sensory (Neurological): Unimpaired Palpation: No palpable anomalies             Upper Extremity (UE) Exam    Side: Right upper extremity  Side: Left upper extremity  Skin & Extremity Inspection: Skin color, temperature, and hair growth are WNL. No peripheral edema or cyanosis. No masses, redness, swelling, asymmetry, or associated skin lesions. No contractures.  Skin & Extremity Inspection: Skin color, temperature, and hair growth are WNL. No peripheral edema or cyanosis. No masses, redness, swelling, asymmetry, or associated skin lesions. No contractures.  Functional ROM: Unrestricted ROM          Functional ROM: Unrestricted ROM          Muscle Tone/Strength: Functionally intact. No obvious neuro-muscular anomalies detected.  Muscle Tone/Strength: Functionally intact. No obvious neuro-muscular anomalies  detected.  Sensory (Neurological): Neurogenic pain pattern          Sensory (Neurological): Unimpaired          Palpation: No palpable anomalies              Palpation: No palpable anomalies              Provocative Test(s):  Phalen's test: deferred Tinel's test: deferred Apley's scratch test (touch opposite shoulder):  Action 1 (Across chest): deferred Action 2 (Overhead): deferred Action 3 (LB reach): deferred   Provocative Test(s):  Phalen's test: deferred Tinel's test: deferred Apley's scratch test (touch opposite shoulder):  Action 1 (Across chest): deferred Action 2 (Overhead): deferred Action 3 (LB reach): deferred     Assessment  Primary Diagnosis & Pertinent Problem List: The primary encounter diagnosis was Cervical radicular pain. Diagnoses of Cervical disc disorder  with radiculopathy of cervical region and Hx of cervical spine surgery were also pertinent to this visit.  Visit Diagnosis (New problems to examiner): 1. Cervical radicular pain   2. Cervical disc disorder with radiculopathy of cervical region   3. Hx of cervical spine surgery    Plan of Care (Initial workup plan)  Assessment and Plan    Cervical radiculopathy   Chronic cervical radiculopathy presents with intermittent severe pain radiating down the right arm. Previous disc herniation surgery was successful, but episodic pain and pulsations persist. A prior steroid injection provided temporary relief. Current imaging shows well-managed disc replacement at C7-T1. A cervical epidural steroid injection at C7-T1 is scheduled for March 30, 2024, at 8 AM, pending insurance approval, to target the right side. Administer Valium for relaxation during the procedure.  Thoracic outlet syndrome   Thoracic outlet syndrome with previous nerve block treatment has resulted in sensitivity in the posterior neck area.        Procedure Orders         Cervical Epidural Injection      Provider-requested follow-up: Return in  about 6 days (around 03/30/2024) for Right C-ESI, in clinic (PO Valium 5mg ).  Future Appointments  Date Time Provider Department Center  03/30/2024  8:00 AM Edward Jolly, MD [facility] None  04/26/2024  1:45 PM Venetia Night, MD [facility] None  11/04/2024  8:30 AM Arnett, Lyn Records, FNP [facility]   I discussed the assessment and treatment plan with the patient. The patient was provided an opportunity to ask questions and all were answered. The patient agreed with the plan and demonstrated an understanding of the instructions.  Patient advised to call back or seek an in-person evaluation if the symptoms or condition worsens.  Duration of encounter: 45 minutes.  Total time on encounter, as per AMA guidelines included both the face-to-face and non-face-to-face time personally spent by the physician and/or other qualified health care professional(s) on the day of the encounter (includes time in activities that require the physician or other qualified health care professional and does not include time in activities normally performed by clinical staff). Physician's time may include the following activities when performed: Preparing to see the patient (e.g., pre-charting review of records, searching for previously ordered imaging, lab work, and nerve conduction tests) Review of prior analgesic pharmacotherapies. Reviewing PMP Interpreting ordered tests (e.g., lab work, imaging, nerve conduction tests) Performing post-procedure evaluations, including interpretation of diagnostic procedures Obtaining and/or reviewing separately obtained history Performing a medically appropriate examination and/or evaluation Counseling and educating the patient/family/caregiver Ordering medications, tests, or procedures Referring and communicating with other health care professionals (when not separately reported) Documenting clinical information in the electronic or other health record Independently interpreting  results (not separately reported) and communicating results to the patient/ family/caregiver Care coordination (not separately reported)  Note by: Edward Jolly, MD (TTS and AI technology used. I apologize for any typographical errors that were not detected and corrected.) Date: 03/24/2024; Time: 9:36 AM

## 2024-03-24 NOTE — Patient Instructions (Addendum)
 ______________________________________________________________________    General Risks and Possible Complications  Patient Responsibilities: It is important that you read this as it is part of your informed consent. It is our duty to inform you of the risks and possible complications associated with treatments offered to you. It is your responsibility as a patient to read this and to ask questions about anything that is not clear or that you believe was not covered in this document.  Patient's Rights: You have the right to refuse treatment. You also have the right to change your mind, even after initially having agreed to have the treatment done. However, under this last option, if you wait until the last second to change your mind, you may be charged for the materials used up to that point.  Introduction: Medicine is not an Visual merchandiser. Everything in Medicine, including the lack of treatment(s), carries the potential for danger, harm, or loss (which is by definition: Risk). In Medicine, a complication is a secondary problem, condition, or disease that can aggravate an already existing one. All treatments carry the risk of possible complications. The fact that a side effects or complications occurs, does not imply that the treatment was conducted incorrectly. It must be clearly understood that these can happen even when everything is done following the highest safety standards.  No treatment: You can choose not to proceed with the proposed treatment alternative. The "PRO(s)" would include: avoiding the risk of complications associated with the therapy. The "CON(s)" would include: not getting any of the treatment benefits. These benefits fall under one of three categories: diagnostic; therapeutic; and/or palliative. Diagnostic benefits include: getting information which can ultimately lead to improvement of the disease or symptom(s). Therapeutic benefits are those associated with the successful  treatment of the disease. Finally, palliative benefits are those related to the decrease of the primary symptoms, without necessarily curing the condition (example: decreasing the pain from a flare-up of a chronic condition, such as incurable terminal cancer).  General Risks and Complications: These are associated to most interventional treatments. They can occur alone, or in combination. They fall under one of the following six (6) categories: no benefit or worsening of symptoms; bleeding; infection; nerve damage; allergic reactions; and/or death. No benefits or worsening of symptoms: In Medicine there are no guarantees, only probabilities. No healthcare provider can ever guarantee that a medical treatment will work, they can only state the probability that it may. Furthermore, there is always the possibility that the condition may worsen, either directly, or indirectly, as a consequence of the treatment. Bleeding: This is more common if the patient is taking a blood thinner, either prescription or over the counter (example: Goody Powders, Fish oil, Aspirin, Garlic, etc.), or if suffering a condition associated with impaired coagulation (example: Hemophilia, cirrhosis of the liver, low platelet counts, etc.). However, even if you do not have one on these, it can still happen. If you have any of these conditions, or take one of these drugs, make sure to notify your treating physician. Infection: This is more common in patients with a compromised immune system, either due to disease (example: diabetes, cancer, human immunodeficiency virus [HIV], etc.), or due to medications or treatments (example: therapies used to treat cancer and rheumatological diseases). However, even if you do not have one on these, it can still happen. If you have any of these conditions, or take one of these drugs, make sure to notify your treating physician. Nerve Damage: This is more common when the treatment is  an invasive one, but it  can also happen with the use of medications, such as those used in the treatment of cancer. The damage can occur to small secondary nerves, or to large primary ones, such as those in the spinal cord and brain. This damage may be temporary or permanent and it may lead to impairments that can range from temporary numbness to permanent paralysis and/or brain death. Allergic Reactions: Any time a substance or material comes in contact with our body, there is the possibility of an allergic reaction. These can range from a mild skin rash (contact dermatitis) to a severe systemic reaction (anaphylactic reaction), which can result in death. Death: In general, any medical intervention can result in death, most of the time due to an unforeseen complication. ______________________________________________________________________      ______________________________________________________________________    Preparing for your procedure  Appointments: If you think you may not be able to keep your appointment, call 24-48 hours in advance to cancel. We need time to make it available to others.  Procedure visits are for procedures only. During your procedure appointment there will be: NO Prescription Refills*. NO medication changes or discussions*. NO discussion of disability issues*. NO unrelated pain problem evaluations*. NO evaluations to order other pain procedures*. *These will be addressed at a separate and distinct evaluation encounter on the provider's evaluation schedule and not during procedure days.  Instructions: Food intake: Avoid eating anything solid for at least 8 hours prior to your procedure. Clear liquid intake: You may take clear liquids such as water up to 2 hours prior to your procedure. (No carbonated drinks. No soda.) Transportation: Unless otherwise stated by your physician, bring a driver. (Driver cannot be a Market researcher, Pharmacist, community, or any other form of public transportation.) Morning  Medicines: Except for blood thinners, take all of your other morning medications with a sip of water. Make sure to take your heart and blood pressure medicines. If your blood pressure's lower number is above 100, the case will be rescheduled. Blood thinners: Make sure to stop your blood thinners as instructed.  If you take a blood thinner, but were not instructed to stop it, call our office [phone] and ask to talk to a nurse. Not stopping a blood thinner prior to certain procedures could lead to serious complications. Diabetics on insulin: Notify the staff so that you can be scheduled 1st case in the morning. If your diabetes requires high dose insulin, take only  of your normal insulin dose the morning of the procedure and notify the staff that you have done so. Preventing infections: Shower with an antibacterial soap the morning of your procedure.  Build-up your immune system: Take 1000 mg of Vitamin C with every meal (3 times a day) the day prior to your procedure. Antibiotics: Inform the nursing staff if you are taking any antibiotics or if you have any conditions that may require antibiotics prior to procedures. (Example: recent joint implants)   Pregnancy: If you are pregnant make sure to notify the nursing staff. Not doing so may result in injury to the fetus, including death.  Sickness: If you have a cold, fever, or any active infections, call and cancel or reschedule your procedure. Receiving steroids while having an infection may result in complications. Arrival: You must be in the facility at least 30 minutes prior to your scheduled procedure. Tardiness: Your scheduled time is also the cutoff time. If you do not arrive at least 15 minutes prior to your procedure, you will  be rescheduled.  Children: Do not bring any children with you. Make arrangements to keep them home. Dress appropriately: There is always a possibility that your clothing may get soiled. Avoid long dresses. Valuables:  Do not bring any jewelry or valuables.  Reasons to call and reschedule or cancel your procedure: (Following these recommendations will minimize the risk of a serious complication.) Surgeries: Avoid having procedures within 2 weeks of any surgery. (Avoid for 2 weeks before or after any surgery). Flu Shots: Avoid having procedures within 2 weeks of a flu shots or . (Avoid for 2 weeks before or after immunizations). Barium: Avoid having a procedure within 7-10 days after having had a radiological study involving the use of radiological contrast. (Myelograms, Barium swallow or enema study). Heart attacks: Avoid any elective procedures or surgeries for the initial 6 months after a "Myocardial Infarction" (Heart Attack). Blood thinners: It is imperative that you stop these medications before procedures. Let us know if you if you take any blood thinner.  Infection: Avoid procedures during or within two weeks of an infection (including chest colds or gastrointestinal problems). Symptoms associated with infections include: Localized redness, fever, chills, night sweats or profuse sweating, burning sensation when voiding, cough, congestion, stuffiness, runny nose, sore throat, diarrhea, nausea, vomiting, cold or Flu symptoms, recent or current infections. It is specially important if the infection is over the area that we intend to treat. Heart and lung problems: Symptoms that may suggest an active cardiopulmonary problem include: cough, chest pain, breathing difficulties or shortness of breath, dizziness, ankle swelling, uncontrolled high or unusually low blood pressure, and/or palpitations. If you are experiencing any of these symptoms, cancel your procedure and contact your primary care physician for an evaluation.  Remember:  Regular Business hours are:  Monday to Thursday 8:00 AM to 4:00 PM  Provider's Schedule: Delano Metz, MD:  Procedure days: Tuesday and Thursday 7:30 AM to 4:00 PM  Edward Jolly, MD:  Procedure days: Monday and Wednesday 7:30 AM to 4:00 PM Last  Updated: 12/01/2023 ______________________________________________________________________     Epidural Steroid Injection Patient Information  Description: The epidural space surrounds the nerves as they exit the spinal cord.  In some patients, the nerves can be compressed and inflamed by a bulging disc or a tight spinal canal (spinal stenosis).  By injecting steroids into the epidural space, we can bring irritated nerves into direct contact with a potentially helpful medication.  These steroids act directly on the irritated nerves and can reduce swelling and inflammation which often leads to decreased pain.  Epidural steroids may be injected anywhere along the spine and from the neck to the low back depending upon the location of your pain.   After numbing the skin with local anesthetic (like Novocaine), a small needle is passed into the epidural space slowly.  You may experience a sensation of pressure while this is being done.  The entire block usually last less than 10 minutes.  Conditions which may be treated by epidural steroids:  Low back and leg pain Neck and arm pain Spinal stenosis Post-laminectomy syndrome Herpes zoster (shingles) pain Pain from compression fractures  Preparation for the injection:  Do not eat any solid food or dairy products within 8 hours of your appointment.  You may drink clear liquids up to 3 hours before appointment.  Clear liquids include water, black coffee, juice or soda.  No milk or cream please. You may take your regular medication, including pain medications, with a sip of water  before your appointment  Diabetics should hold regular insulin (if taken separately) and take 1/2 normal NPH dos the morning of the procedure.  Carry some sugar containing items with you to your appointment. A driver must accompany you and be prepared to drive you home after your procedure.  Bring all  your current medications with your. An IV may be inserted and sedation may be given at the discretion of the physician.   A blood pressure cuff, EKG and other monitors will often be applied during the procedure.  Some patients may need to have extra oxygen administered for a short period. You will be asked to provide medical information, including your allergies, prior to the procedure.  We must know immediately if you are taking blood thinners (like Coumadin/Warfarin)  Or if you are allergic to IV iodine contrast (dye). We must know if you could possible be pregnant.  Possible side-effects: Bleeding from needle site Infection (rare, may require surgery) Nerve injury (rare) Numbness & tingling (temporary) Difficulty urinating (rare, temporary) Spinal headache ( a headache worse with upright posture) Light -headedness (temporary) Pain at injection site (several days) Decreased blood pressure (temporary) Weakness in arm/leg (temporary) Pressure sensation in back/neck (temporary)  Call if you experience: Fever/chills associated with headache or increased back/neck pain. Headache worsened by an upright position. New onset weakness or numbness of an extremity below the injection site Hives or difficulty breathing (go to the emergency room) Inflammation or drainage at the infection site Severe back/neck pain Any new symptoms which are concerning to you  Please note:  Although the local anesthetic injected can often make your back or neck feel good for several hours after the injection, the pain will likely return.  It takes 3-7 days for steroids to work in the epidural space.  You may not notice any pain relief for at least that one week.  If effective, we will often do a series of three injections spaced 3-6 weeks apart to maximally decrease your pain.  After the initial series, we generally will wait several months before considering a repeat injection of the same type.  If you have any  questions, please call [phone] [facility]

## 2024-03-24 NOTE — Progress Notes (Signed)
 Safety precautions to be maintained throughout the outpatient stay will include: orient to surroundings, keep bed in low position, maintain call bell within reach at all times, provide assistance with transfer out of bed and ambulation.

## 2024-03-28 ENCOUNTER — Telehealth: Payer: Self-pay | Admitting: Neurosurgery

## 2024-03-28 NOTE — Telephone Encounter (Signed)
 Patient needs a new work note exactly like the last one. Only leave out "as tolerated". They will not accept him to return to work with out the "as tolerated". Patient said that he knows what he can not not do so there is no issue. He will access note through care everywhere.

## 2024-03-29 ENCOUNTER — Ambulatory Visit: Admitting: Physical Therapy

## 2024-03-30 ENCOUNTER — Other Ambulatory Visit: Payer: Self-pay | Admitting: Student in an Organized Health Care Education/Training Program

## 2024-03-30 ENCOUNTER — Encounter: Payer: Self-pay | Admitting: Student in an Organized Health Care Education/Training Program

## 2024-03-30 ENCOUNTER — Ambulatory Visit: Admitting: Student in an Organized Health Care Education/Training Program

## 2024-03-30 ENCOUNTER — Ambulatory Visit
Admission: RE | Admit: 2024-03-30 | Discharge: 2024-03-30 | Disposition: A | Discharge status: Home / Self Care | Source: Ambulatory Visit | Attending: Student in an Organized Health Care Education/Training Program | Admitting: Student in an Organized Health Care Education/Training Program

## 2024-03-30 VITALS — BP 146/94 | Temp 97.0°F | Resp 16 | Ht 74.0 in | Wt 201.0 lb

## 2024-03-30 DIAGNOSIS — Z9889 Other specified postprocedural states: Secondary | ICD-10-CM | POA: Diagnosis not present

## 2024-03-30 DIAGNOSIS — M5412 Radiculopathy, cervical region: Secondary | ICD-10-CM | POA: Insufficient documentation

## 2024-03-30 DIAGNOSIS — M5010 Cervical disc disorder with radiculopathy, unspecified cervical region: Secondary | ICD-10-CM | POA: Insufficient documentation

## 2024-03-30 DIAGNOSIS — G894 Chronic pain syndrome: Secondary | ICD-10-CM

## 2024-03-30 MED ORDER — SODIUM CHLORIDE (PF) 0.9 % IJ SOLN
INTRAMUSCULAR | Status: AC
  Filled 2024-03-30: qty 10

## 2024-03-30 MED ORDER — DIAZEPAM 5 MG PO TABS
ORAL_TABLET | ORAL | Status: AC
  Filled 2024-03-30: qty 1

## 2024-03-30 MED ORDER — ROPIVACAINE HCL 2 MG/ML IJ SOLN
INTRAMUSCULAR | Status: AC
  Filled 2024-03-30: qty 20

## 2024-03-30 MED ORDER — SODIUM CHLORIDE 0.9% FLUSH
1.0000 mL | Freq: Once | INTRAVENOUS | Status: AC
  Administered 2024-03-30: 1 mL

## 2024-03-30 MED ORDER — DEXAMETHASONE SODIUM PHOSPHATE 10 MG/ML IJ SOLN
10.0000 mg | Freq: Once | INTRAMUSCULAR | Status: AC
  Administered 2024-03-30: 10 mg

## 2024-03-30 MED ORDER — DEXAMETHASONE SODIUM PHOSPHATE 10 MG/ML IJ SOLN
INTRAMUSCULAR | Status: AC
  Filled 2024-03-30: qty 1

## 2024-03-30 MED ORDER — IOHEXOL 180 MG/ML  SOLN
INTRAMUSCULAR | Status: AC
Start: 2024-03-30 — End: ?
  Filled 2024-03-30: qty 20

## 2024-03-30 MED ORDER — LIDOCAINE HCL (PF) 2 % IJ SOLN
INTRAMUSCULAR | Status: AC
  Filled 2024-03-30: qty 10

## 2024-03-30 MED ORDER — LIDOCAINE HCL 2 % IJ SOLN
20.0000 mL | Freq: Once | INTRAMUSCULAR | Status: AC
  Administered 2024-03-30: 200 mg

## 2024-03-30 MED ORDER — DIAZEPAM 5 MG PO TABS
5.0000 mg | ORAL_TABLET | ORAL | Status: AC
  Administered 2024-03-30: 5 mg via ORAL

## 2024-03-30 MED ORDER — IOHEXOL 180 MG/ML  SOLN
10.0000 mL | Freq: Once | INTRAMUSCULAR | Status: AC
  Administered 2024-03-30: 10 mL via EPIDURAL

## 2024-03-30 MED ORDER — ROPIVACAINE HCL 2 MG/ML IJ SOLN
1.0000 mL | Freq: Once | INTRAMUSCULAR | Status: AC
  Administered 2024-03-30: 1 mL via EPIDURAL

## 2024-03-30 NOTE — Progress Notes (Signed)
 PROVIDER NOTE: Interpretation of information contained herein should be left to medically-trained personnel. Specific patient instructions are provided elsewhere under "Patient Instructions" section of medical record. This document was created in part using STT-dictation technology, any transcriptional errors that may result from this process are unintentional.  Patient: Daniel Calderon Type: Established DOB: 17-Feb-1966 MRN: [id number] PCP: Allegra Grana, FNP  Service: Procedure DOS: 03/30/2024 Setting: Ambulatory Location: Ambulatory outpatient facility Delivery: Face-to-face Provider: Edward Jolly, MD Specialty: Interventional Pain Management Specialty designation: 09 Location: Outpatient facility Ref. Prov.: Allegra Grana, FNP       Interventional Therapy   Procedure: Cervical Epidural Steroid injection (CESI) (Interlaminar) #1  Laterality: Right  Level: C7-T1 Imaging: Fluoroscopy-assisted DOS: 03/30/2024  Performed by: Edward Jolly, MD Anesthesia: Local anesthesia (1-2% Lidocaine) Sedation: Minimal Sedation                         Purpose: Diagnostic/Therapeutic Indications: Cervicalgia, cervical radicular pain, degenerative disc disease, severe enough to impact quality of life or function. 1. Cervical radicular pain   2. Cervical disc disorder with radiculopathy of cervical region   3. Hx of cervical spine surgery    NAS-11 score:   Pre-procedure: 2 /10   Post-procedure: 0-No pain/10      Position  Prep  Materials:  Location setting: Procedure suite Position: Prone, on modified reverse trendelenburg to facilitate breathing, with head in head-cradle. Pillows positioned under chest (below chin-level) with cervical spine flexed. Safety Precautions: Patient was assessed for positional comfort and pressure points before starting the procedure. Prepping solution: DuraPrep (Iodine Povacrylex [0.7% available iodine] and Isopropyl Alcohol, 74% w/w) Prep Area: Entire   cervicothoracic region Approach: percutaneous, paramedial Intended target: Posterior cervical epidural space Materials Procedure:  Tray: Epidural Needle(s): Epidural (Tuohy) Qty: 1 Length: (90mm) 3.5-inch Gauge: 22G  H&P (Pre-op Assessment):  Mr. Smeltz is a 58 y.o. (year old), male patient, seen today for interventional treatment. He  has a past surgical history that includes Thoracic outlet surgery (Left, 2011); Scalene node biopsy / excision; Nasal septum surgery; nerve block; Vasectomy; Colonoscopy with propofol (N/A, 11/11/2017); polypectomy (11/11/2017); Knee arthroscopy with medial menisectomy (Left, 07/26/2018); Esophagogastroduodenoscopy (egd) with propofol (N/A, 08/25/2019); Colonoscopy with propofol (N/A, 10/14/2022); and Cervical disc arthroplasty (N/A, 02/03/2024). Mr. Rorke has a current medication list which includes the following prescription(s): amlodipine, cyanocobalamin, sotyktu, omeprazole, and sertraline, and the following Facility-Administered Medications: phenylephrine hcl (pressors). His primarily concern today is the Neck Pain  Initial Vital Signs:  Pulse/HCG Rate:  ECG Heart Rate: 67 Temp: (!) 97 F (36.1 C) Resp: 16 BP: (!) 140/96 SpO2: 100 %  BMI: Estimated body mass index is 25.81 kg/m as calculated from the following:   Height as of this encounter: 6\' 2"  (1.88 m).   Weight as of this encounter: 201 lb (91.2 kg).  Risk Assessment: Allergies: Reviewed. He is allergic to erythromycin, dexamethasone, keflex [cephalexin], percocet [oxycodone-acetaminophen], prednisone, and gabapentin.  Allergy Precautions: None required Coagulopathies: Reviewed. None identified.  Blood-thinner therapy: None at this time Active Infection(s): Reviewed. None identified. Mr. Fatzinger is afebrile  Site Confirmation: Mr. Ferrante was asked to confirm the procedure and laterality before marking the site Procedure checklist: Completed Consent: Before the procedure and under  the influence of no sedative(s), amnesic(s), or anxiolytics, the patient was informed of the treatment options, risks and possible complications. To fulfill our ethical and legal obligations, as recommended by the American Medical Association's Code of Ethics, I have informed the patient of  my clinical impression; the nature and purpose of the treatment or procedure; the risks, benefits, and possible complications of the intervention; the alternatives, including doing nothing; the risk(s) and benefit(s) of the alternative treatment(s) or procedure(s); and the risk(s) and benefit(s) of doing nothing. The patient was provided information about the general risks and possible complications associated with the procedure. These may include, but are not limited to: failure to achieve desired goals, infection, bleeding, organ or nerve damage, allergic reactions, paralysis, and death. In addition, the patient was informed of those risks and complications associated to Spine-related procedures, such as failure to decrease pain; infection (i.e.: Meningitis, epidural or intraspinal abscess); bleeding (i.e.: epidural hematoma, subarachnoid hemorrhage, or any other type of intraspinal or peri-dural bleeding); organ or nerve damage (i.e.: Any type of peripheral nerve, nerve root, or spinal cord injury) with subsequent damage to sensory, motor, and/or autonomic systems, resulting in permanent pain, numbness, and/or weakness of one or several areas of the body; allergic reactions; (i.e.: anaphylactic reaction); and/or death. Furthermore, the patient was informed of those risks and complications associated with the medications. These include, but are not limited to: allergic reactions (i.e.: anaphylactic or anaphylactoid reaction(s)); adrenal axis suppression; blood sugar elevation that in diabetics may result in ketoacidosis or comma; water retention that in patients with history of congestive heart failure may result in  shortness of breath, pulmonary edema, and decompensation with resultant heart failure; weight gain; swelling or edema; medication-induced neural toxicity; particulate matter embolism and blood vessel occlusion with resultant organ, and/or nervous system infarction; and/or aseptic necrosis of one or more joints. Finally, the patient was informed that Medicine is not an exact science; therefore, there is also the possibility of unforeseen or unpredictable risks and/or possible complications that may result in a catastrophic outcome. The patient indicated having understood very clearly. We have given the patient no guarantees and we have made no promises. Enough time was given to the patient to ask questions, all of which were answered to the patient's satisfaction. Mr. Breton has indicated that he wanted to continue with the procedure. Attestation: I, the ordering provider, attest that I have discussed with the patient the benefits, risks, side-effects, alternatives, likelihood of achieving goals, and potential problems during recovery for the procedure that I have provided informed consent. Date  Time: 03/30/2024  8:02 AM  Pre-Procedure Preparation:  Monitoring: As per clinic protocol. Respiration, ETCO2, SpO2, BP, heart rate and rhythm monitor placed and checked for adequate function Safety Precautions: Patient was assessed for positional comfort and pressure points before starting the procedure. Time-out: I initiated and conducted the "Time-out" before starting the procedure, as per protocol. The patient was asked to participate by confirming the accuracy of the "Time Out" information. Verification of the correct person, site, and procedure were performed and confirmed by me, the nursing staff, and the patient. "Time-out" conducted as per Joint Commission's Universal Protocol (UP.01.01.01). Time: 0828 Start Time: 0828 hrs.  Description  Narrative of Procedure:          Rationale (medical necessity):  procedure needed and proper for the diagnosis and/or treatment of the patient's medical symptoms and needs. Start Time: 0828 hrs. Safety Precautions: Aspiration looking for blood return was conducted prior to all injections. At no point did we inject any substances, as a needle was being advanced. No attempts were made at seeking any paresthesias. Safe injection practices and needle disposal techniques used. Medications properly checked for expiration dates. SDV (single dose vial) medications used. Description of procedure:  Protocol guidelines were followed. The patient was assisted into a comfortable position. The target area was identified and the area prepped in the usual manner. Skin & deeper tissues infiltrated with local anesthetic. Appropriate amount of time allowed to pass for local anesthetics to take effect. Using fluoroscopic guidance, the epidural needle was introduced through the skin, ipsilateral to the reported pain, and advanced to the target area. Posterior laminar os was contacted and the needle walked caudad, until the lamina was cleared. The ligamentum flavum was engaged and the epidural space identified using "loss-of-resistance technique" with 2-3 ml of PF-NaCl (0.9% NSS), in a 5cc dedicated LOR syringe. (See "Imaging guidance" below for use of contrast details.) Once proper needle placement was secured, and negative aspiration confirmed, the solution was injected in intermittent fashion, asking for systemic symptoms every 0.5cc. The needles were then removed and the area cleansed, making sure to leave some of the prepping solution back to take advantage of its long term bactericidal properties.  Vitals:   03/30/24 0822 03/30/24 0827 03/30/24 0831 03/30/24 0835  BP: (!) 144/106 (!) 149/106 (!) 156/109 (!) 146/94  Resp: 13 11 16 16   Temp:      TempSrc:      SpO2: 99% 100% 99% 99%  Weight:      Height:         End Time: 0832 hrs.  Imaging Guidance (Spinal):          Type of  Imaging Technique: Fluoroscopy Guidance (Spinal) Indication(s): Fluoroscopy guidance for needle placement to enhance accuracy in procedures requiring precise needle localization for targeted delivery of medication in or near specific anatomical locations not easily accessible without such real-time imaging assistance. Exposure Time: Please see nurses notes. Contrast: Before injecting any contrast, we confirmed that the patient did not have an allergy to iodine, shellfish, or radiological contrast. Once satisfactory needle placement was completed at the desired level, radiological contrast was injected. Contrast injected under live fluoroscopy. No contrast complications. See chart for type and volume of contrast used. Fluoroscopic Guidance: I was personally present during the use of fluoroscopy. "Tunnel Vision Technique" used to obtain the best possible view of the target area. Parallax error corrected before commencing the procedure. "Direction-depth-direction" technique used to introduce the needle under continuous pulsed fluoroscopy. Once target was reached, antero-posterior, oblique, and lateral fluoroscopic projection used confirm needle placement in all planes. Images permanently stored in EMR. Interpretation: I personally interpreted the imaging intraoperatively. Adequate needle placement confirmed in multiple planes. Appropriate spread of contrast into desired area was observed. No evidence of afferent or efferent intravascular uptake. No intrathecal or subarachnoid spread observed. Permanent images saved into the patient's record.  Post-operative Assessment:  Post-procedure Vital Signs:  Pulse/HCG Rate:  69 Temp: (!) 97 F (36.1 C) Resp: 16 BP: (!) 146/94 SpO2: 99 %  EBL: None  Complications: No immediate post-treatment complications observed by team, or reported by patient.  Note: The patient tolerated the entire procedure well. A repeat set of vitals were taken after the procedure and  the patient was kept under observation following institutional policy, for this type of procedure. Post-procedural neurological assessment was performed, showing return to baseline, prior to discharge. The patient was provided with post-procedure discharge instructions, including a section on how to identify potential problems. Should any problems arise concerning this procedure, the patient was given instructions to immediately contact us, at any time, without hesitation. In any case, we plan to contact the patient by telephone for a follow-up status report  regarding this interventional procedure.  Comments:  No additional relevant information.  Plan of Care (POC)  Orders:  No orders of the defined types were placed in this encounter.   Medications ordered for procedure: Meds ordered this encounter  Medications   iohexol (OMNIPAQUE) 180 MG/ML injection 10 mL    Must be Myelogram-compatible. If not available, you may substitute with a water-soluble, non-ionic, hypoallergenic, myelogram-compatible radiological contrast medium.   lidocaine (XYLOCAINE) 2 % (with pres) injection 400 mg   diazepam (VALIUM) tablet 5 mg    Make sure Flumazenil is available in the pyxis when using this medication. If oversedation occurs, administer 0.2 mg IV over 15 sec. If after 45 sec no response, administer 0.2 mg again over 1 min; may repeat at 1 min intervals; not to exceed 4 doses (1 mg)   ropivacaine (PF) 2 mg/mL (0.2%) (NAROPIN) injection 1 mL   sodium chloride flush (NS) 0.9 % injection 1 mL   dexamethasone (DECADRON) injection 10 mg   Medications administered: We administered iohexol, lidocaine, diazepam, ropivacaine (PF) 2 mg/mL (0.2%), sodium chloride flush, and dexamethasone.  See the medical record for exact dosing, route, and time of administration.  Follow-up plan:   Return in about 5 weeks (around 05/04/2024), or F2F PPE.       Right C7-T1 ESI 03/30/24    Recent Visits Date Type Provider Dept   03/24/24 Office Visit Edward Jolly, MD [facility]  Showing recent visits within past 90 days and meeting all other requirements Today's Visits Date Type Provider Dept  03/30/24 Procedure visit Edward Jolly, MD [facility]  Showing today's visits and meeting all other requirements Future Appointments Date Type Provider Dept  05/04/24 Appointment Edward Jolly, MD [facility]  Showing future appointments within next 90 days and meeting all other requirements  Disposition: Discharge home  Discharge (Date  Time): 03/30/2024; 0845 hrs.   Primary Care Physician: Allegra Grana, FNP Location: [facility] Note by: Edward Jolly, MD (TTS technology used. I apologize for any typographical errors that were not detected and corrected.) Date: 03/30/2024; Time: 9:56 AM  Disclaimer:  Medicine is not an Visual merchandiser. The only guarantee in medicine is that nothing is guaranteed. It is important to note that the decision to proceed with this intervention was based on the information collected from the patient. The Data and conclusions were drawn from the patient's questionnaire, the interview, and the physical examination. Because the information was provided in large part by the patient, it cannot be guaranteed that it has not been purposely or unconsciously manipulated. Every effort has been made to obtain as much relevant data as possible for this evaluation. It is important to note that the conclusions that lead to this procedure are derived in large part from the available data. Always take into account that the treatment will also be dependent on availability of resources and existing treatment guidelines, considered by other Pain Management Practitioners as being common knowledge and practice, at the time of the intervention. For Medico-Legal purposes, it is also important to point out that variation in procedural techniques and  pharmacological choices are the acceptable norm. The indications, contraindications, technique, and results of the above procedure should only be interpreted and judged by a Board-Certified Interventional Pain Specialist with extensive familiarity and expertise in the same exact procedure and technique.

## 2024-03-30 NOTE — Patient Instructions (Signed)

## 2024-03-30 NOTE — Progress Notes (Signed)
 Safety precautions to be maintained throughout the outpatient stay will include: orient to surroundings, keep bed in low position, maintain call bell within reach at all times, provide assistance with transfer out of bed and ambulation.

## 2024-03-31 ENCOUNTER — Encounter: Admitting: Physical Therapy

## 2024-03-31 ENCOUNTER — Telehealth: Payer: Self-pay

## 2024-03-31 NOTE — Telephone Encounter (Signed)
 No issues post-procedure.

## 2024-04-07 ENCOUNTER — Encounter: Payer: 59 | Admitting: Family

## 2024-04-20 ENCOUNTER — Other Ambulatory Visit: Payer: Self-pay

## 2024-04-25 ENCOUNTER — Other Ambulatory Visit: Payer: Self-pay | Admitting: Family Medicine

## 2024-04-25 DIAGNOSIS — M5412 Radiculopathy, cervical region: Secondary | ICD-10-CM

## 2024-04-26 ENCOUNTER — Encounter: Payer: Commercial Managed Care - PPO | Admitting: Neurosurgery

## 2024-04-26 ENCOUNTER — Ambulatory Visit
Admission: RE | Admit: 2024-04-26 | Discharge: 2024-04-26 | Disposition: A | Discharge status: Home / Self Care | Source: Ambulatory Visit | Attending: Neurosurgery | Admitting: Neurosurgery

## 2024-04-26 ENCOUNTER — Other Ambulatory Visit: Payer: Self-pay

## 2024-04-26 ENCOUNTER — Encounter: Admitting: Neurosurgery

## 2024-04-26 DIAGNOSIS — M5412 Radiculopathy, cervical region: Secondary | ICD-10-CM | POA: Diagnosis not present

## 2024-04-26 DIAGNOSIS — M4802 Spinal stenosis, cervical region: Secondary | ICD-10-CM | POA: Diagnosis not present

## 2024-04-28 ENCOUNTER — Other Ambulatory Visit: Payer: Self-pay

## 2024-04-28 ENCOUNTER — Ambulatory Visit (INDEPENDENT_AMBULATORY_CARE_PROVIDER_SITE_OTHER): Admitting: Neurosurgery

## 2024-04-28 ENCOUNTER — Encounter: Payer: Self-pay | Admitting: Neurosurgery

## 2024-04-28 VITALS — BP 128/90 | Temp 97.8°F | Ht 74.0 in | Wt 201.0 lb

## 2024-04-28 DIAGNOSIS — Z09 Encounter for follow-up examination after completed treatment for conditions other than malignant neoplasm: Secondary | ICD-10-CM

## 2024-04-28 DIAGNOSIS — M5412 Radiculopathy, cervical region: Secondary | ICD-10-CM

## 2024-04-28 MED ORDER — GABAPENTIN 100 MG PO CAPS
100.0000 mg | ORAL_CAPSULE | Freq: Three times a day (TID) | ORAL | 0 refills | Status: DC
  Filled 2024-04-28 (×2): qty 90, 30d supply, fill #0

## 2024-04-28 NOTE — Progress Notes (Signed)
   REFERRING PHYSICIAN:  No referring provider defined for this encounter.  DOS: 02/03/24 C6-7 arthroplasty  HISTORY OF PRESENT ILLNESS:  04/28/2024  His pain has improved significantly.  He continues to have some stingers down his right arm in a similar distribution to prior to surgery.  These have decreased in frequency but are still occurring.  03/17/24 [name] follows up today for 6 week post-op visit.  He has had some improvement of his neck mobility and stiffness as well as resolution of the muscle spasms he was having in his arm.  He does continue to have some nervelike pain throughout his right arm.  He is scheduled for an injection with Dr. Rhesa Celeste on 03/24/2024. Overall he is very pleased with his surgical outcome thus far.  02/16/24 Daniel Calderon is about 2 weeks status post cervical arthroplasty. Overall, he is doing relatively well after his recent surgery.  He reports significant improvement of his right rating arm pain however he is having some more discomfort into his left scalp and left chest similar to the symptoms he had prior to his scalene surgery.  He is also having some hoarseness but is largely tolerating normal diet.  He is eager to start physical therapy as he still having some weakness particularly in his right tricep.  He is not currently taking pain medication.  PHYSICAL EXAMINATION:  NEUROLOGICAL:  General: In no acute distress.   Awake, alert, oriented to person, place, and time.  Pupils equal round and reactive to light.  Facial tone is symmetric.  Tongue protrusion is midline.  There is no pronator drift.   Strength: Side Biceps Triceps Deltoid Interossei Grip Wrist Ext. Wrist Flex.  R 5 5 5 5 5 5 5   L 5 5 5 5 5 5 5     Incision c/d/I and healing well  Imaging:  No complications noted  Assessment / Plan: Daniel Calderon is doing relatively well after recent cervical arthroplasty.  He does still have some symptoms of radiculopathy.  Will start him  on low-dose gabapentin.  He has had some mood issues in the past on this, so I have cautioned him to stop if he has irritability.  If he continues to have these radicular symptoms, we will move forward with CT myelogram to determine whether he has continued foraminal compression.  I am certain that it is better, as his strength has substantially improved and his pain has improved.  However, I would expect him to be a little bit further along at this point.    I will see him back in 3 months either way.  Advised to contact the office if any questions or concerns arise.   Jodeen Munch MD Dept of Neurosurgery

## 2024-04-29 ENCOUNTER — Other Ambulatory Visit (HOSPITAL_COMMUNITY): Payer: Self-pay

## 2024-04-29 ENCOUNTER — Other Ambulatory Visit: Payer: Self-pay

## 2024-04-29 NOTE — Progress Notes (Signed)
 Specialty Pharmacy Refill Coordination Note  Daniel Calderon is a 58 y.o. male contacted today regarding refills of specialty medication(s) Sotyktu .  Patient requested (Patient-Rptd) Delivery   Delivery date: (Patient-Rptd) 05/13/24   Verified address: (Patient-Rptd) [address] [place] [address]   Medication will be filled on 05/12/24.   This medication requires a new prior authorization, and is currently being processed by the PA team. Specialty Pharmacy team will contact patient if any delays arise.

## 2024-05-02 ENCOUNTER — Other Ambulatory Visit: Payer: Self-pay

## 2024-05-02 NOTE — Progress Notes (Signed)
 PA approved.

## 2024-05-02 NOTE — Progress Notes (Signed)
 Pharmacy Patient Advocate Encounter   Received notification from Patient Pharmacy that prior authorization for Sotyktu  is required/requested.   Insurance verification completed.   The patient is insured through [facility] .   Per test claim: PA required; PA submitted to above mentioned insurance via CoverMyMeds Key/confirmation #/EOC [id number] Status is pending

## 2024-05-02 NOTE — Progress Notes (Signed)
 PA submitted.

## 2024-05-02 NOTE — Progress Notes (Signed)
 Pharmacy Patient Advocate Encounter  Received notification from [facility] that Prior Authorization for Sotyktu  has been APPROVED from 05/02/24 to 05/02/25   PA #/Case ID/Reference #:  [id number]

## 2024-05-04 ENCOUNTER — Ambulatory Visit: Admitting: Student in an Organized Health Care Education/Training Program

## 2024-05-05 NOTE — Progress Notes (Signed)
 Patient for DG Cervical Myelogram Inj/CT Cervical Myelogram on Friday 05/06/2024, I called and spoke with the patient on the phone and gave pre-procedure instructions. Pt was made aware to be here at 8:30a and check in at the new entrance. Pt stated understanding.  Called 04/29/24

## 2024-05-06 ENCOUNTER — Ambulatory Visit
Admission: RE | Admit: 2024-05-06 | Discharge: 2024-05-06 | Disposition: A | Discharge status: Home / Self Care | Source: Ambulatory Visit | Attending: Neurosurgery | Admitting: Neurosurgery

## 2024-05-06 DIAGNOSIS — M5412 Radiculopathy, cervical region: Secondary | ICD-10-CM | POA: Diagnosis not present

## 2024-05-06 DIAGNOSIS — M4802 Spinal stenosis, cervical region: Secondary | ICD-10-CM | POA: Diagnosis not present

## 2024-05-06 DIAGNOSIS — Z981 Arthrodesis status: Secondary | ICD-10-CM | POA: Diagnosis not present

## 2024-05-06 DIAGNOSIS — M542 Cervicalgia: Secondary | ICD-10-CM | POA: Diagnosis not present

## 2024-05-06 DIAGNOSIS — M5030 Other cervical disc degeneration, unspecified cervical region: Secondary | ICD-10-CM | POA: Diagnosis not present

## 2024-05-06 MED ORDER — LIDOCAINE HCL (PF) 1 % IJ SOLN
5.0000 mL | Freq: Once | INTRAMUSCULAR | Status: AC
  Administered 2024-05-06: 5 mL

## 2024-05-06 MED ORDER — IOHEXOL 300 MG/ML  SOLN
10.0000 mL | Freq: Once | INTRAMUSCULAR | Status: AC | PRN
  Administered 2024-05-06: 10 mL via INTRATHECAL

## 2024-05-06 MED ORDER — ACETAMINOPHEN 325 MG PO TABS
ORAL_TABLET | ORAL | Status: AC
  Filled 2024-05-06: qty 2

## 2024-05-06 MED ORDER — ACETAMINOPHEN 325 MG PO TABS
650.0000 mg | ORAL_TABLET | Freq: Four times a day (QID) | ORAL | Status: DC | PRN
  Administered 2024-05-06: 650 mg via ORAL
  Filled 2024-05-06 (×2): qty 2

## 2024-05-06 NOTE — Procedures (Signed)
 PROCEDURE SUMMARY:  Successful fluoroscopic guided cervical myelogram.   Lumbar puncture was performed at the level of L3-4.  Subsequently 10mL of contrast was slowly hand injected. The patient was tilted and contrast was noted moving towards the cervical spine.  No immediate complications.  Pt tolerated well.   EBL = none  Please see full dictation in imaging section of Epic for procedure details.   Electronically Signed: Niamya Vittitow M Mialynn Shelvin, PA-C 05/06/2024, 10:24 AM

## 2024-05-06 NOTE — Discharge Instructions (Signed)
-  You may continue to take 650mg  of tylenol  every 4-6hrs as needed for headache  -If ineffective, you may increase this to 1000mg  every 4-6hrs -Try to rest for the remainder of the day. Avoid any strenuous activity -Keep your head elevated 30-40 degrees, at least, to alleviate headache

## 2024-05-09 ENCOUNTER — Other Ambulatory Visit: Payer: Self-pay

## 2024-05-09 ENCOUNTER — Telehealth: Payer: Self-pay | Admitting: Neurosurgery

## 2024-05-09 DIAGNOSIS — G9600 Cerebrospinal fluid leak, unspecified: Secondary | ICD-10-CM

## 2024-05-09 NOTE — Telephone Encounter (Signed)
  Media Information        C6-7 arthroplasty on 02/03/24

## 2024-05-09 NOTE — Telephone Encounter (Signed)
 Patient is calling back to let

## 2024-05-09 NOTE — Telephone Encounter (Signed)
 Patient states that he his symptoms started immediately after CT myelogram.   Headache and nausea/ vomiting  Rates his headache 7/10 taking tylenol  500mg  without relief   Patient is able to tolerate clear liquids at this time.   Patient's headache worsens when standing and improves when lays down.

## 2024-05-09 NOTE — Telephone Encounter (Signed)
 Order has been placed for blood patch. [facility] has contacted Daniel Calderon and he is scheduled for a blood patch at 10:30am tomorrow.

## 2024-05-09 NOTE — Telephone Encounter (Signed)
 Patient is calling to let our office know that he is still really nauseated when sitting up to the point that he is gagging like he is going to throw up.

## 2024-05-09 NOTE — Discharge Instructions (Signed)
 Blood Patch Discharge Instructions ? ?Go home and rest quietly for the next 24 hours.  It is important to lie flat for the next 24 hours.  Get up only to go to the restroom.  You may lie in the bed or on a couch on your back, your stomach, your left side or your right side.  You may have one pillow under your head.  You may have pillows between your knees while you are on your side or under your knees while you are on your back. ? ?DO NOT drive today.  Recline the seat as far back as it will go, while still wearing your seat belt, on the way home. ? ?You may get up to go to the bathroom as needed.  You may sit up for 10 minutes to eat.  You may resume your normal diet and medications unless otherwise indicated.  Drink lots of extra fluids today and tomorrow..  ? ?You may resume normal activities after your 24 hours of bed rest is over; however, do not exert yourself strongly or do any heavy lifting tomorrow. ? ?Call your physician for a follow-up appointment.  ? ?If you have any questions  after you arrive home, please call [phone]. ? ?Discharge instructions have been explained to the patient.  The patient, or the person responsible for the patient, fully understands these instructions. ? ?   ?

## 2024-05-09 NOTE — Telephone Encounter (Signed)
 Patient calling again. He had an injection for his myelogram on Friday. Side effects are severe headache and his neck feels very tense. He feels like he has to throw up. What should he do?

## 2024-05-09 NOTE — Telephone Encounter (Signed)
 I messaged their scheduler to see if they can see him for a blood patch to avoid him having to go to ER

## 2024-05-09 NOTE — Telephone Encounter (Signed)
 Per secure chat from Dr Myrlene Asper, "I would contact [facility] for blood patch. We have a pathway set for these patients. [phone]. Just let them know patients needs referral for blood patch".  Daniel Dar, RN contacted [facility] and discussed this patient with Boris Byars, RN and Philip Bravo, RN.   Per discussion with Anastacio Karvonen, PA, ok to provider order for a blood patch.   I contacted the patient and explained that I discussed his symptoms with Diego Foy and they are likely as a result from a CSF leak from the lumbar puncture from his CT myelogram. I informed him that someone from [facility] should be contacting him shortly to arrange a blood patch. I advised that he lay flat as much as possible and take OTC meds and drink caffeine in the meantime.

## 2024-05-10 ENCOUNTER — Ambulatory Visit
Admission: RE | Admit: 2024-05-10 | Discharge: 2024-05-10 | Disposition: A | Discharge status: Home / Self Care | Source: Ambulatory Visit | Attending: Neurosurgery | Admitting: Neurosurgery

## 2024-05-10 ENCOUNTER — Other Ambulatory Visit: Payer: Self-pay | Admitting: Neurosurgery

## 2024-05-10 DIAGNOSIS — G971 Other reaction to spinal and lumbar puncture: Secondary | ICD-10-CM | POA: Diagnosis not present

## 2024-05-10 DIAGNOSIS — G9600 Cerebrospinal fluid leak, unspecified: Secondary | ICD-10-CM

## 2024-05-10 MED ORDER — IOPAMIDOL (ISOVUE-M 200) INJECTION 41%
1.0000 mL | Freq: Once | INTRAMUSCULAR | Status: AC
  Administered 2024-05-10: 1 mL via EPIDURAL

## 2024-05-10 NOTE — Progress Notes (Signed)
 20cc blood collected from pts Right AC prior to blood patch procedure. Pt tolerated well. 1 successful attempt. Gauze and tape applied after.

## 2024-05-12 ENCOUNTER — Other Ambulatory Visit (HOSPITAL_COMMUNITY): Payer: Self-pay

## 2024-05-12 ENCOUNTER — Other Ambulatory Visit: Payer: Self-pay

## 2024-05-24 ENCOUNTER — Other Ambulatory Visit: Payer: Self-pay

## 2024-06-03 ENCOUNTER — Other Ambulatory Visit: Payer: Self-pay

## 2024-06-06 ENCOUNTER — Other Ambulatory Visit: Payer: Self-pay

## 2024-06-08 ENCOUNTER — Other Ambulatory Visit: Payer: Self-pay | Admitting: Pharmacy Technician

## 2024-06-08 ENCOUNTER — Other Ambulatory Visit: Payer: Self-pay

## 2024-06-08 NOTE — Progress Notes (Signed)
 Specialty Pharmacy Refill Coordination Note  Daniel Calderon is a 58 y.o. male contacted today regarding refills of specialty medication(s) Deucravacitinib  (Sotyktu )   Patient requested Delivery   Delivery date: 06/16/24   Verified address: [address]  [place] [address]   Medication will be filled on 06/15/24.

## 2024-06-10 ENCOUNTER — Telehealth: Admitting: Physician Assistant

## 2024-06-10 ENCOUNTER — Other Ambulatory Visit: Payer: Self-pay

## 2024-06-10 ENCOUNTER — Encounter: Payer: Self-pay | Admitting: Neurosurgery

## 2024-06-10 DIAGNOSIS — B9689 Other specified bacterial agents as the cause of diseases classified elsewhere: Secondary | ICD-10-CM

## 2024-06-10 DIAGNOSIS — J019 Acute sinusitis, unspecified: Secondary | ICD-10-CM | POA: Diagnosis not present

## 2024-06-10 MED ORDER — AMOXICILLIN-POT CLAVULANATE 875-125 MG PO TABS
1.0000 | ORAL_TABLET | Freq: Two times a day (BID) | ORAL | 0 refills | Status: DC
  Filled 2024-06-10: qty 14, 7d supply, fill #0

## 2024-06-10 NOTE — Progress Notes (Signed)

## 2024-06-12 ENCOUNTER — Other Ambulatory Visit: Payer: Self-pay

## 2024-06-12 ENCOUNTER — Telehealth: Admitting: Physician Assistant

## 2024-06-12 DIAGNOSIS — Z789 Other specified health status: Secondary | ICD-10-CM | POA: Diagnosis not present

## 2024-06-12 MED ORDER — DOXYCYCLINE HYCLATE 100 MG PO TABS
100.0000 mg | ORAL_TABLET | Freq: Two times a day (BID) | ORAL | 0 refills | Status: DC
Start: 2024-06-12 — End: 2024-07-28
  Filled 2024-06-12: qty 14, 7d supply, fill #0

## 2024-06-12 MED ORDER — ONDANSETRON 4 MG PO TBDP
4.0000 mg | ORAL_TABLET | Freq: Three times a day (TID) | ORAL | 0 refills | Status: AC | PRN
  Filled 2024-06-12: qty 20, 7d supply, fill #0

## 2024-06-12 NOTE — Progress Notes (Signed)
 Virtual Visit Consent   Daniel Calderon, you are scheduled for a virtual visit with a [facility] provider today. Just as with appointments in the office, your consent must be obtained to participate. Your consent will be active for this visit and any virtual visit you may have with one of our providers in the next 365 days. If you have a MyChart account, a copy of this consent can be sent to you electronically.  As this is a virtual visit, video technology does not allow for your provider to perform a traditional examination. This may limit your provider's ability to fully assess your condition. If your provider identifies any concerns that need to be evaluated in person or the need to arrange testing (such as labs, EKG, etc.), we will make arrangements to do so. Although advances in technology are sophisticated, we cannot ensure that it will always work on either your end or our end. If the connection with a video visit is poor, the visit may have to be switched to a telephone visit. With either a video or telephone visit, we are not always able to ensure that we have a secure connection.  By engaging in this virtual visit, you consent to the provision of healthcare and authorize for your insurance to be billed (if applicable) for the services provided during this visit. Depending on your insurance coverage, you may receive a charge related to this service.  I need to obtain your verbal consent now. Are you willing to proceed with your visit today? Daniel Calderon has provided verbal consent on 06/12/2024 for a virtual visit (video or telephone). Daniel Calderon, [place]  Date: 06/12/2024 2:31 PM   Virtual Visit via Video Note   I, Daniel Calderon, connected with  Daniel Calderon  ([id number], Nov 18, 1966) on 06/12/24 at  2:30 PM EDT by a video-enabled telemedicine application and verified that I am speaking with the correct person using two identifiers.  Location: Patient: Virtual Visit  Location Patient: Home Provider: Virtual Visit Location Provider: Home Office   I discussed the limitations of evaluation and management by telemedicine and the availability of in person appointments. The patient expressed understanding and agreed to proceed.    History of Present Illness: Daniel Calderon is a 58 y.o. who identifies as a male who was assigned male at birth, and is being seen today for side effect to antibiotic. Patient evaluated on 6/20 via e-visit and diagnosed with acute sinusitis. Augmentin  was started but causing substantial loose stool and nausea without emesis, despite taking with food and starting a probiotic. Does note improvement in sinus pain and pressure with the antibiotic. Denies fever, chills. Abdominal cramping without focal abdominal pain.  HPI: HPI  Problems:  Patient Active Problem List   Diagnosis Date Noted   Cervical disc disorder with radiculopathy of cervical region 03/24/2024   Hx of cervical spine surgery 03/24/2024   Right arm weakness 02/03/2024   Cervical radicular pain 02/03/2024   Hepatic steatosis 02/03/2024   Abnormal chest CT 01/08/2024   Memory impairment 11/02/2023   Anxiety 11/02/2023   Family history of ASCVD 11/02/2023   GERD (gastroesophageal reflux disease) 11/02/2023   History of colonic polyps    Family history of colon cancer    Encounter for general adult medical examination with abnormal findings 10/06/2022   Splenomegaly 10/03/2022   Left groin pain 09/30/2022   Asthma 07/21/2022   Nausea 04/08/2022   Tremor 04/08/2022   ASIS pain 04/08/2022  Left sided numbness 02/13/2020   Hyperlipidemia 12/07/2019   Radiculopathy, cervical and lumbar 12/06/2019   Abdominal pain, epigastric    Left flank pain 08/15/2019   Elevated LFTs 08/10/2019   Hypogammaglobulinemia (HCC) 05/17/2019   Excessive daytime sleepiness 03/28/2019   Cataplexy 03/28/2019   OSA on CPAP 03/28/2019   Acute pain of left knee 04/18/2018   Cutaneous  skin tags 04/18/2018   Lower extremity edema 04/18/2018   Thoracic outlet syndrome 02/19/2018   Pain in limb 02/19/2018   Muscle strain 09/01/2017   Chest pain 03/21/2017   Dyspnea on exertion 02/19/2017   Language difficulty 02/19/2017   BPH with obstruction/lower urinary tract symptoms 12/03/2015   Medication refill 09/13/2015   Erectile disorder, generalized, mild 07/26/2015   Hypogonadism in male 05/24/2015   Environmental allergies 04/13/2015   Sleep apnea 04/13/2015   Migraines 04/13/2015   Neck pain 04/13/2015   Adjustment disorder with mixed anxiety and depressed mood 04/13/2015   Psoriasis 04/13/2015   History of kidney stones 04/13/2015   Neuropathy 04/13/2015   Benign fibroma of prostate 03/24/2015   Hypertension 03/24/2015   Elevation of level of transaminase or lactic acid dehydrogenase (LDH) 03/24/2015   Fatigue 03/24/2015    Allergies:  Allergies  Allergen Reactions   Erythromycin Nausea And Vomiting   Dexamethasone  Nausea And Vomiting   Keflex [Cephalexin] Other (See Comments)    Turned Pt tanned or orange   Percocet [Oxycodone -Acetaminophen ] Other (See Comments)    Very sensitive - Makes Pt very sedated    Prednisone  Other (See Comments)    Makes pt upset or irritated   Gabapentin  Other (See Comments)    Aggressive   Medications:  Current Outpatient Medications:    doxycycline  (VIBRA -TABS) 100 MG tablet, Take 1 tablet (100 mg total) by mouth 2 (two) times daily., Disp: 14 tablet, Rfl: 0   ondansetron  (ZOFRAN -ODT) 4 MG disintegrating tablet, Take 1 tablet (4 mg total) by mouth every 8 (eight) hours as needed for nausea or vomiting., Disp: 20 tablet, Rfl: 0   amLODipine  (NORVASC ) 10 MG tablet, Take 1 tablet (10 mg total) by mouth daily., Disp: 90 tablet, Rfl: 3   cyanocobalamin  (VITAMIN B12) 1000 MCG tablet, Take 1,000 mcg by mouth daily., Disp: , Rfl:    Deucravacitinib  (SOTYKTU ) 6 MG TABS, Take one tablet by mouth once daily., Disp: 30 tablet, Rfl:  5   gabapentin  (NEURONTIN ) 100 MG capsule, Take 1 capsule (100 mg total) by mouth 3 (three) times daily., Disp: 90 capsule, Rfl: 0   omeprazole  (PRILOSEC) 20 MG capsule, Take 1 capsule (20 mg total) by mouth daily., Disp: 90 capsule, Rfl: 3   sertraline  (ZOLOFT ) 100 MG tablet, Take 1 tablet (100 mg total) by mouth daily., Disp: 90 tablet, Rfl: 3  Current Facility-Administered Medications:    phenylephrine  100 mcg/mL CONC. dilution injection for priapism ([facility] USE ONLY), 200 mcg, Intracavernosal, Once,   Observations/Objective: Patient is well-developed, well-nourished in no acute distress.  Resting comfortably at home.  Head is normocephalic, atraumatic.  No labored breathing.  Speech is clear and coherent with logical content.  Patient is alert and oriented at baseline.   Assessment and Plan: 1. Medication intolerance (Primary) - doxycycline  (VIBRA -TABS) 100 MG tablet; Take 1 tablet (100 mg total) by mouth 2 (two) times daily.  Dispense: 14 tablet; Refill: 0 - ondansetron  (ZOFRAN -ODT) 4 MG disintegrating tablet; Take 1 tablet (4 mg total) by mouth every 8 (eight) hours as needed for nausea or vomiting.  Dispense:  20 tablet; Refill: 0  Stop Augmentin . Continue hydration, probiotic. Start [employer]  Tomorrow as long as GI symptoms are improving, can start Doxycycline  for sinusitis.  Follow-up in person for any non-resolving, new or worsening symptoms despite these changes.   Follow Up Instructions: I discussed the assessment and treatment plan with the patient. The patient was provided an opportunity to ask questions and all were answered. The patient agreed with the plan and demonstrated an understanding of the instructions.  A copy of instructions were sent to the patient via MyChart unless otherwise noted below.   The patient was advised to call back or seek an in-person evaluation if the symptoms worsen or if the condition fails to improve as anticipated.     Daniel Velma Lunger, PA-C

## 2024-06-12 NOTE — Patient Instructions (Signed)
 Daniel Calderon, thank you for joining [name], PA-C for today's virtual visit.  While this provider is not your primary care provider (PCP), if your PCP is located in our provider database this encounter information will be shared with them immediately following your visit.   A [facility] MyChart account gives you access to today's visit and all your visits, tests, and labs performed at [facility]  click here if you don't have a [facility] MyChart account or go to [email or web address]  Consent: (Patient) Daniel Calderon provided verbal consent for this virtual visit at the beginning of the encounter.  Current Medications:  Current Outpatient Medications:    doxycycline  (VIBRA -TABS) 100 MG tablet, Take 1 tablet (100 mg total) by mouth 2 (two) times daily., Disp: 14 tablet, Rfl: 0   ondansetron  (ZOFRAN -ODT) 4 MG disintegrating tablet, Take 1 tablet (4 mg total) by mouth every 8 (eight) hours as needed for nausea or vomiting., Disp: 20 tablet, Rfl: 0   amLODipine  (NORVASC ) 10 MG tablet, Take 1 tablet (10 mg total) by mouth daily., Disp: 90 tablet, Rfl: 3   cyanocobalamin  (VITAMIN B12) 1000 MCG tablet, Take 1,000 mcg by mouth daily., Disp: , Rfl:    Deucravacitinib  (SOTYKTU ) 6 MG TABS, Take one tablet by mouth once daily., Disp: 30 tablet, Rfl: 5   gabapentin  (NEURONTIN ) 100 MG capsule, Take 1 capsule (100 mg total) by mouth 3 (three) times daily., Disp: 90 capsule, Rfl: 0   omeprazole  (PRILOSEC) 20 MG capsule, Take 1 capsule (20 mg total) by mouth daily., Disp: 90 capsule, Rfl: 3   sertraline  (ZOLOFT ) 100 MG tablet, Take 1 tablet (100 mg total) by mouth daily., Disp: 90 tablet, Rfl: 3  Current Facility-Administered Medications:    phenylephrine  100 mcg/mL CONC. dilution injection for priapism ([facility] USE ONLY), 200 mcg, Intracavernosal, Once,    Medications ordered in this encounter:  Meds ordered this encounter  Medications    doxycycline  (VIBRA -TABS) 100 MG tablet    Sig: Take 1 tablet (100 mg total) by mouth 2 (two) times daily.    Dispense:  14 tablet    Refill:  0    Supervising Provider:   LAMPTEY, PHILIP O [[id number]]   ondansetron  (ZOFRAN -ODT) 4 MG disintegrating tablet    Sig: Take 1 tablet (4 mg total) by mouth every 8 (eight) hours as needed for nausea or vomiting.    Dispense:  20 tablet    Refill:  0    Supervising Provider:   LAMPTEY, PHILIP O [[id number]]     *If you need refills on other medications prior to your next appointment, please contact your pharmacy*  Follow-Up: Call back or seek an in-person evaluation if the symptoms worsen or if the condition fails to improve as anticipated.  [facility] [phone]  Other Instructions Stop Augmentin . Continue hydration, probiotic. Start BRAT diet (see below) Tomorrow as long as GI symptoms are improving, can start Doxycycline  for sinusitis.  Follow-up in person for any non-resolving, new or worsening symptoms despite these changes.   Food Choices to Help Relieve Diarrhea, Adult Diarrhea can make you feel weak and cause you to become dehydrated. Dehydration is a condition in which there is not enough water  or other fluids in the body. It is important to choose the right foods and drinks to: Relieve diarrhea. Replace lost fluids and nutrients. Prevent dehydration. What are tips for following this plan? Relieving diarrhea Avoid foods that make your diarrhea worse. These may  include: Foods and drinks that are sweetened with high-fructose corn syrup, honey, or sweeteners such as xylitol, sorbitol, and mannitol. Check food labels for these ingredients. Fried, greasy, or spicy foods. Raw fruits and vegetables. Eat foods that are rich in probiotics. These include foods such as yogurt and fermented milk products. Probiotics can help increase healthy bacteria in your stomach and intestines (gastrointestinal or GI tract). This may help  digestion and stop diarrhea. If you have lactose intolerance, avoid dairy products. These may make your diarrhea worse. Take medicine to help stop diarrhea only as told by your health care provider. Replacing nutrients  Eat bland, easy-to-digest foods in small amounts as you are able, until your diarrhea starts to get better. These foods include bananas, applesauce, rice, toast, and crackers. Over time, add nutrient-rich foods as your body tolerates them or as told by your health care provider. These include: Well-cooked protein foods, such as eggs, lean meats like fish or chicken without skin, and tofu. Peeled, seeded, and soft-cooked fruits and vegetables. Low-fat dairy products. Whole grains. Take vitamin and mineral supplements as told by your health care provider. Preventing dehydration  Start by sipping water  or a solution to prevent dehydration (oral rehydration solution, or ORS). This is a drink that helps replace fluids and minerals your body has lost. You can buy an ORS at pharmacies and retail stores. Try to drink at least 8-10 cups (2,000-2,500 mL) of fluid each day to help replace lost fluids. If your urine is pale yellow, you are getting enough fluids. You may drink other liquids in addition to water , such as fruit juice that you have added water  to (diluted fruit juice) or low-calorie sports drinks, as tolerated or as told by your health care provider. Avoid drinks with caffeine, such as coffee, tea, or soft drinks. Avoid alcohol. This information is not intended to replace advice given to you by your health care provider. Make sure you discuss any questions you have with your health care provider. Document Revised: 05/27/2022 Document Reviewed: 05/27/2022 Elsevier Patient Education  2024 Elsevier Inc.   If you have been instructed to have an in-person evaluation today at a local Urgent Care facility, please use the link below. It will take you to a list of all of our  available [facility], including address, phone number and hours of operation. Please do not delay care.  [facility]  If you or a family member do not have a primary care provider, use the link below to schedule a visit and establish care. When you choose a [facility] primary care physician or advanced practice provider, you gain a long-term partner in health. Find a Primary Care Provider  Learn more about [facility]'s in-office and virtual care options: [facility] - Get Care Now

## 2024-06-13 ENCOUNTER — Other Ambulatory Visit: Payer: Self-pay

## 2024-06-13 ENCOUNTER — Telehealth: Payer: Self-pay

## 2024-06-13 NOTE — Telephone Encounter (Signed)
 Patient left a voicemail that since being on Sotyktu  he is having a difficult time fighting off illness. He has had the flu twice and it is taking him a long time to recover after being sick. He wanted to know if there was anything you recommend to boost his immune system, or if there is another medication he take instead of Sotyktu . Please advise.

## 2024-06-14 NOTE — Telephone Encounter (Signed)
 Discussed with patient. He wants to continue Sotyktu  because it's been working great for him. Follow up appointment scheduled. MyChart message sent with list of supplements to use when sick.

## 2024-06-27 DIAGNOSIS — G4733 Obstructive sleep apnea (adult) (pediatric): Secondary | ICD-10-CM | POA: Diagnosis not present

## 2024-07-06 ENCOUNTER — Other Ambulatory Visit: Payer: Self-pay

## 2024-07-07 ENCOUNTER — Ambulatory Visit: Payer: 59 | Admitting: Family

## 2024-07-07 ENCOUNTER — Other Ambulatory Visit: Payer: Self-pay

## 2024-07-11 ENCOUNTER — Other Ambulatory Visit: Payer: Self-pay

## 2024-07-11 ENCOUNTER — Other Ambulatory Visit (HOSPITAL_COMMUNITY): Payer: Self-pay

## 2024-07-11 NOTE — Progress Notes (Signed)
 Specialty Pharmacy Refill Coordination Note  Spoke with Thor Nannini is a 58 y.o. male contacted today regarding refills of specialty medication(s) Deucravacitinib  (Sotyktu )  Doses on hand: 11  Patient requested: Delivery   Delivery date: 07/18/24   Verified address: [address]  [place] [address]  Medication will be filled on 07/15/24.

## 2024-07-14 ENCOUNTER — Other Ambulatory Visit: Payer: Self-pay

## 2024-07-18 ENCOUNTER — Ambulatory Visit: Admitting: Dermatology

## 2024-07-19 ENCOUNTER — Other Ambulatory Visit: Payer: Self-pay

## 2024-07-28 ENCOUNTER — Ambulatory Visit (INDEPENDENT_AMBULATORY_CARE_PROVIDER_SITE_OTHER): Admitting: Neurosurgery

## 2024-07-28 ENCOUNTER — Encounter: Payer: Self-pay | Admitting: Neurosurgery

## 2024-07-28 ENCOUNTER — Other Ambulatory Visit: Payer: Self-pay

## 2024-07-28 ENCOUNTER — Ambulatory Visit
Admission: RE | Admit: 2024-07-28 | Discharge: 2024-07-28 | Disposition: A | Discharge status: Home / Self Care | Source: Ambulatory Visit | Attending: Neurosurgery | Admitting: Neurosurgery

## 2024-07-28 VITALS — BP 144/96 | Ht 74.0 in | Wt 216.0 lb

## 2024-07-28 DIAGNOSIS — M5412 Radiculopathy, cervical region: Secondary | ICD-10-CM

## 2024-07-28 DIAGNOSIS — M50322 Other cervical disc degeneration at C5-C6 level: Secondary | ICD-10-CM | POA: Diagnosis not present

## 2024-07-28 DIAGNOSIS — Z9889 Other specified postprocedural states: Secondary | ICD-10-CM | POA: Diagnosis not present

## 2024-07-28 MED ORDER — METHOCARBAMOL 500 MG PO TABS
500.0000 mg | ORAL_TABLET | Freq: Four times a day (QID) | ORAL | 0 refills | Status: DC | PRN
  Filled 2024-07-28: qty 60, 15d supply, fill #0

## 2024-07-28 MED ORDER — METHYLPREDNISOLONE 4 MG PO TBPK
ORAL_TABLET | ORAL | 0 refills | Status: DC
  Filled 2024-07-28: qty 21, 6d supply, fill #0

## 2024-07-28 NOTE — Progress Notes (Signed)
   REFERRING PHYSICIAN:  Dineen Rollene MATSU, [facility] [address] [place],  [place] [address]  DOS: 02/03/24 C6-7 arthroplasty  HISTORY OF PRESENT ILLNESS:  07/28/2024 Daniel Calderon presents with 5 days of pain.  His pain is in his upper back and chest.  It is constant.  He did not have any significant trauma.  He reports intermittent weakness.  04/28/2024 His pain has improved significantly.  He continues to have some stingers down his right arm in a similar distribution to prior to surgery.  These have decreased in frequency but are still occurring.  03/17/24 Daniel Calderon follows up today for 6 week post-op visit.  He has had some improvement of his neck mobility and stiffness as well as resolution of the muscle spasms he was having in his arm.  He does continue to have some nervelike pain throughout his right arm.  He is scheduled for an injection with Dr. Marcelino on 03/24/2024. Overall he is very pleased with his surgical outcome thus far.  02/16/24 Daniel Calderon is about 2 weeks status post cervical arthroplasty. Overall, he is doing relatively well after his recent surgery.  He reports significant improvement of his right rating arm pain however he is having some more discomfort into his left scalp and left chest similar to the symptoms he had prior to his scalene surgery.  He is also having some hoarseness but is largely tolerating normal diet.  He is eager to start physical therapy as he still having some weakness particularly in his right tricep.  He is not currently taking pain medication.  PHYSICAL EXAMINATION:  NEUROLOGICAL:  General: In no acute distress.   Awake, alert, oriented to person, place, and time.  Pupils equal round and reactive to light.  Facial tone is symmetric.  Tongue protrusion is midline.  There is no pronator drift.   Strength: Side Biceps Triceps Deltoid Interossei Grip Wrist Ext. Wrist Flex.  R 5 4+ 5 5 5 5 5   L 5 5 5 5 5 5 5     Incision c/d/I and  healing well  Imaging:  No new imaging  Assessment / Plan: Daniel Calderon has had a recent exacerbation.  I will start a steroid taper and muscle relaxants to help with his pain.  Will get some flexion-extension x-rays today to make sure there have been no complications with his implant.  If he does not improve in the next few days, he will need a myelogram.    I spent a total of 10 minutes in this patient's care today. This time was spent reviewing pertinent records including imaging studies, obtaining and confirming history, performing a directed evaluation, formulating and discussing my recommendations, and documenting the visit within the medical record.   Daniel Daisy MD Dept of Neurosurgery

## 2024-07-29 ENCOUNTER — Encounter: Payer: Self-pay | Admitting: Neurosurgery

## 2024-07-29 ENCOUNTER — Telehealth: Payer: Self-pay | Admitting: Neurosurgery

## 2024-07-29 NOTE — Telephone Encounter (Signed)
 Patient stopped by the office to find out his x-rays that he had done after his appointment yesterday.

## 2024-07-29 NOTE — Telephone Encounter (Signed)
 Dr. Clois sent the patient a MyChart message about his xray results.

## 2024-08-05 ENCOUNTER — Other Ambulatory Visit: Payer: Self-pay

## 2024-08-05 ENCOUNTER — Other Ambulatory Visit: Payer: Self-pay | Admitting: Dermatology

## 2024-08-05 NOTE — Progress Notes (Signed)
 Specialty Pharmacy Refill Coordination Note  Daniel Calderon is a 58 y.o. male contacted today regarding refills of specialty medication(s) Deucravacitinib  (Sotyktu )   Patient requested Delivery   Delivery date: 08/12/24   Verified address: [address]  [place] [address]   Medication will be filled on 08/11/24.   Refill request sent to Rexene Rattler. Send to [facility] for rewrite when approved.   This fill date is pending response to refill request from provider. Patient is aware and if they have not received fill by intended date they must follow up with pharmacy.

## 2024-08-07 MED ORDER — SOTYKTU 6 MG PO TABS
ORAL_TABLET | ORAL | 3 refills | Status: DC
  Filled 2024-08-08: qty 30, fill #0

## 2024-08-08 ENCOUNTER — Other Ambulatory Visit: Payer: Self-pay | Admitting: Pharmacist

## 2024-08-08 ENCOUNTER — Other Ambulatory Visit: Payer: Self-pay

## 2024-08-08 MED ORDER — SOTYKTU 6 MG PO TABS
ORAL_TABLET | ORAL | 3 refills | Status: DC
  Filled 2024-08-08: qty 30, 30d supply, fill #0
  Filled 2024-09-06 – 2024-09-16 (×2): qty 30, 30d supply, fill #1
  Filled 2024-10-17 – 2024-10-21 (×2): qty 30, 30d supply, fill #2
  Filled 2024-11-09 – 2024-11-14 (×2): qty 30, 30d supply, fill #3

## 2024-08-10 ENCOUNTER — Other Ambulatory Visit: Payer: Self-pay

## 2024-08-15 ENCOUNTER — Other Ambulatory Visit: Payer: Self-pay

## 2024-08-16 ENCOUNTER — Other Ambulatory Visit: Payer: Self-pay | Admitting: Student in an Organized Health Care Education/Training Program

## 2024-08-16 DIAGNOSIS — M5412 Radiculopathy, cervical region: Secondary | ICD-10-CM

## 2024-08-17 ENCOUNTER — Ambulatory Visit
Attending: Student in an Organized Health Care Education/Training Program | Admitting: Student in an Organized Health Care Education/Training Program

## 2024-08-17 ENCOUNTER — Encounter: Payer: Self-pay | Admitting: Student in an Organized Health Care Education/Training Program

## 2024-08-17 VITALS — BP 128/98 | HR 98 | Temp 98.0°F | Resp 16 | Ht 74.0 in | Wt 210.0 lb

## 2024-08-17 DIAGNOSIS — M5412 Radiculopathy, cervical region: Secondary | ICD-10-CM | POA: Diagnosis not present

## 2024-08-17 MED ORDER — DEXAMETHASONE SODIUM PHOSPHATE 10 MG/ML IJ SOLN: 10.0000 mg | Freq: Once | INTRAMUSCULAR | Status: DC

## 2024-08-17 MED ORDER — ROPIVACAINE HCL 2 MG/ML IJ SOLN
9.0000 mL | Freq: Once | INTRAMUSCULAR | Status: AC
  Administered 2024-08-17: 9 mL via PERINEURAL

## 2024-08-17 NOTE — Progress Notes (Signed)
 PROVIDER NOTE: Interpretation of information contained herein should be left to medically-trained personnel. Specific patient instructions are provided elsewhere under Patient Instructions section of medical record. This document was created in part using STT-dictation technology, any transcriptional errors that may result from this process are unintentional.  Patient: Daniel Calderon Type: Established DOB: 1966/11/26 MRN: [id number] PCP: Dineen Rollene MATSU, FNP  Service: Procedure DOS: 08/17/2024 Setting: Ambulatory Location: Ambulatory outpatient facility Delivery: Face-to-face Provider: Wallie Sherry, MD Specialty: Interventional Pain Management Specialty designation: 09 Location: Outpatient facility Ref. Prov.: Dineen Rollene MATSU, FNP       Interventional Therapy   Type:  Left trapezius, periscpular Trigger Point Injection (Myoneural Block) (3+ muscle groups)  #1 (w/ steroids)  CPT: 20552 Laterality: LEFT  Imaging: N/A. Landmark-guided,           Anesthesia: Local anesthesia (1-2% Lidocaine ) DOS: 08/17/2024  Performed by: Wallie Sherry, MD  Medical Necessity (reasoning)  Purpose: Diagnostic/Therapeutic Rationale (medical necessity): procedure needed and proper for the diagnosis and/or treatment of Mr. Goldston's medical symptoms and needs. Indications: Cervicalgia pain severe enough to impact quality of life and/or function. 1. Cervical radicular pain    NAS-11 Pain score:   Pre-procedure: 3 /10   Post-procedure: 2 /10      Approach: Percutaneous  Type of procedure: Myoneural injection   Position  Prep  Materials  Position: Sitting. Patient assisted into a comfortable position. Pressure points checked.  Prep solution: ChloraPrep (2% chlorhexidine  gluconate and 70% isopropyl alcohol) The target area was identified and the area prepped in the usual manner.   Materials:   Tray: Block Needle(s):  Type: Regular  Gauge (G): 27  Length: 1.5-in  Qty: 1  H&P  (Pre-op Assessment):  Mr. Knotts is a 58 y.o. (year old), male patient, seen today for interventional treatment. He  has a past surgical history that includes Thoracic outlet surgery (Left, 2011); Scalene node biopsy / excision; Nasal septum surgery; nerve block; Vasectomy; Colonoscopy with propofol  (N/A, 11/11/2017); polypectomy (11/11/2017); Knee arthroscopy with medial menisectomy (Left, 07/26/2018); Esophagogastroduodenoscopy (egd) with propofol  (N/A, 08/25/2019); Colonoscopy with propofol  (N/A, 10/14/2022); and Cervical disc arthroplasty (N/A, 02/03/2024). Mr. Code has a current medication list which includes the following prescription(s): amlodipine , cyanocobalamin , sotyktu , methocarbamol , methylprednisolone , omeprazole , ondansetron , and sertraline , and the following Facility-Administered Medications: dexamethasone  and phenylephrine  hcl (pressors). His primarily concern today is the Shoulder Pain (bilateral)  Initial Vital Signs:  Pulse/HCG Rate: 98  Temp: 98 F (36.7 C) Resp: 16 BP: (!) 128/98 SpO2: 99 %  BMI: Estimated body mass index is 26.96 kg/m as calculated from the following:   Height as of this encounter: 6' 2 (1.88 m).   Weight as of this encounter: 210 lb (95.3 kg).  Risk Assessment: Allergies: Reviewed. He is allergic to erythromycin, dexamethasone , keflex [cephalexin], percocet [oxycodone -acetaminophen ], prednisone , and gabapentin .  Allergy Precautions: None required Coagulopathies: Reviewed. None identified.  Blood-thinner therapy: None at this time Active Infection(s): Reviewed. None identified. Mr. Finigan is afebrile  Site Confirmation: Mr. Miles was asked to confirm the procedure and laterality before marking the site Procedure checklist: Completed Consent: Before the procedure and under the influence of no sedative(s), amnesic(s), or anxiolytics, the patient was informed of the treatment options, risks and possible complications. To fulfill our ethical  and legal obligations, as recommended by the American Medical Association's Code of Ethics, I have informed the patient of my clinical impression; the nature and purpose of the treatment or procedure; the risks, benefits, and possible complications of the intervention; the alternatives,  including doing nothing; the risk(s) and benefit(s) of the alternative treatment(s) or procedure(s); and the risk(s) and benefit(s) of doing nothing. The patient was provided information about the general risks and possible complications associated with the procedure. These may include, but are not limited to: failure to achieve desired goals, infection, bleeding, organ or nerve damage, allergic reactions, paralysis, and death. In addition, the patient was informed of those risks and complications associated to the procedure, such as failure to decrease pain; infection; bleeding; organ or nerve damage with subsequent damage to sensory, motor, and/or autonomic systems, resulting in permanent pain, numbness, and/or weakness of one or several areas of the body; allergic reactions; (i.e.: anaphylactic reaction); and/or death. Furthermore, the patient was informed of those risks and complications associated with the medications. These include, but are not limited to: allergic reactions (i.e.: anaphylactic or anaphylactoid reaction(s)); adrenal axis suppression; blood sugar elevation that in diabetics may result in ketoacidosis or comma; water  retention that in patients with history of congestive heart failure may result in shortness of breath, pulmonary edema, and decompensation with resultant heart failure; weight gain; swelling or edema; medication-induced neural toxicity; particulate matter embolism and blood vessel occlusion with resultant organ, and/or nervous system infarction; and/or aseptic necrosis of one or more joints. Finally, the patient was informed that Medicine is not an exact science; therefore, there is also the  possibility of unforeseen or unpredictable risks and/or possible complications that may result in a catastrophic outcome. The patient indicated having understood very clearly. We have given the patient no guarantees and we have made no promises. Enough time was given to the patient to ask questions, all of which were answered to the patient's satisfaction. Mr. Samuelson has indicated that he wanted to continue with the procedure. Attestation: I, the ordering provider, attest that I have discussed with the patient the benefits, risks, side-effects, alternatives, likelihood of achieving goals, and potential problems during recovery for the procedure that I have provided informed consent. Date  Time: 08/17/2024  7:56 AM   Pre-Procedure Preparation:  Monitoring: As per clinic protocol. Respiration, ETCO2, SpO2, BP, heart rate and rhythm monitor placed and checked for adequate function Safety Precautions: Patient was assessed for positional comfort and pressure points before starting the procedure. Time-out: I initiated and conducted the Time-out before starting the procedure, as per protocol. The patient was asked to participate by confirming the accuracy of the Time Out information. Verification of the correct person, site, and procedure were performed and confirmed by me, the nursing staff, and the patient. Time-out conducted as per Joint Commission's Universal Protocol (UP.01.01.01). Time:   Start Time:   hrs.   Narrative                Start Time:   hrs.  The patient was placed in a seated position.  The skin over the left trapezius and periscapular trigger points was prepped with chlorhexidine .  Using palpation, taut bands and trigger points were identified in the left trapezius and periscapular musculature.  A total of 8 trigger points were injected.  A solution containing 7 mL of 0.2% bupivacaine  and 1 mL of Depo-Medrol  (10 mg/mL) was prepared, for a total of 8 mL.  Each trigger  point was injected with 1 mL of the solution using a 27-gauge, 1.5-inch needle.  After negative aspiration at each site, the medication was injected.  Vitals:   08/17/24 0805  BP: (!) 128/98  Pulse: 98  Resp: 16  Temp: 98 F (36.7 C)  SpO2: 99%  Weight: 210 lb (95.3 kg)  Height: 6' 2 (1.88 m)     End Time:   hrs.  Imaging Guidance                Type of Imaging Technique: None used Indication(s): N/A Exposure Time: No patient exposure Contrast: None used. Fluoroscopic Guidance: N/A Ultrasound Guidance: N/A Interpretation: N/A   Post-operative Assessment:  Post-procedure Vital Signs:  Pulse/HCG Rate: 98  Temp: 98 F (36.7 C) Resp: 16 BP: (!) 128/98 SpO2: 99 %  EBL: None  Complications: No immediate post-treatment complications observed by team, or reported by patient.  Note: The patient tolerated the entire procedure well. A repeat set of vitals were taken after the procedure and the patient was kept under observation following institutional policy, for this type of procedure. Post-procedural neurological assessment was performed, showing return to baseline, prior to discharge. The patient was provided with post-procedure discharge instructions, including a section on how to identify potential problems. Should any problems arise concerning this procedure, the patient was given instructions to immediately contact us , at any time, without hesitation. In any case, we plan to contact the patient by telephone for a follow-up status report regarding this interventional procedure.  Comments:  No additional relevant information.   Plan of Care (POC)  Orders:  No orders of the defined types were placed in this encounter.  Consider C-ESI in future for cervical radicular pain  Medications ordered for procedure: Meds ordered this encounter  Medications   ropivacaine  (PF) 2 mg/mL (0.2%) (NAROPIN ) injection 9 mL   dexamethasone  (DECADRON ) injection 10 mg   Medications  administered: We administered ropivacaine  (PF) 2 mg/mL (0.2%).  See the medical record for exact dosing, route, and time of administration.    Right C7-T1 ESI 03/30/24    Follow-up plan:   No follow-ups on file.     Recent Visits No visits were found meeting these conditions. Showing recent visits within past 90 days and meeting all other requirements Today's Visits Date Type Provider Dept  08/17/24 Procedure visit Marcelino Nurse, MD [facility]  Showing today's visits and meeting all other requirements Future Appointments Date Type Provider Dept  09/20/24 Appointment Marcelino Nurse, MD [facility]  Showing future appointments within next 90 days and meeting all other requirements   Disposition: Discharge home  Discharge (Date  Time): 08/17/2024; 0825 hrs.   Primary Care Physician: Dineen Rollene MATSU, FNP Location: [facility] Outpatient [facility] Management Facility Note by: Nurse Marcelino, MD (TTS technology used. I apologize for any typographical errors that were not detected and corrected.) Date: 08/17/2024; Time: 9:41 AM  Disclaimer:  Medicine is not an Visual merchandiser. The only guarantee in medicine is that nothing is guaranteed. It is important to note that the decision to proceed with this intervention was based on the information collected from the patient. The Data and conclusions were drawn from the patient's questionnaire, the interview, and the physical examination. Because the information was provided in large part by the patient, it cannot be guaranteed that it has not been purposely or unconsciously manipulated. Every effort has been made to obtain as much relevant data as possible for this evaluation. It is important to note that the conclusions that lead to this procedure are derived in large part from the available data. Always take into account that the treatment will also be dependent on availability of resources and existing treatment guidelines, considered by  other Pain Management Practitioners as being common knowledge and  practice, at the time of the intervention. For Medico-Legal purposes, it is also important to point out that variation in procedural techniques and pharmacological choices are the acceptable norm. The indications, contraindications, technique, and results of the above procedure should only be interpreted and judged by a Board-Certified Interventional Pain Specialist with extensive familiarity and expertise in the same exact procedure and technique.

## 2024-08-18 ENCOUNTER — Telehealth: Payer: Self-pay | Admitting: *Deleted

## 2024-08-18 NOTE — Telephone Encounter (Signed)
 No problems post procedure.

## 2024-08-23 ENCOUNTER — Other Ambulatory Visit: Payer: Self-pay

## 2024-09-06 ENCOUNTER — Other Ambulatory Visit (HOSPITAL_COMMUNITY): Payer: Self-pay

## 2024-09-07 ENCOUNTER — Other Ambulatory Visit: Payer: Self-pay

## 2024-09-08 ENCOUNTER — Other Ambulatory Visit: Payer: Self-pay

## 2024-09-16 ENCOUNTER — Other Ambulatory Visit: Payer: Self-pay

## 2024-09-16 NOTE — Progress Notes (Signed)
 Specialty Pharmacy Refill Coordination Note  Daniel Calderon is a 58 y.o. male contacted today regarding refills of specialty medication(s) Deucravacitinib  (Sotyktu )   Patient requested Delivery   Delivery date: 09/21/24   Verified address: [address]  [place] [address]   Medication will be filled on 09/20/24.

## 2024-09-19 ENCOUNTER — Other Ambulatory Visit: Payer: Self-pay

## 2024-09-20 ENCOUNTER — Other Ambulatory Visit: Payer: Self-pay

## 2024-09-20 ENCOUNTER — Ambulatory Visit: Attending: Student in an Organized Health Care Education/Training Program | Admitting: Nurse Practitioner

## 2024-09-20 DIAGNOSIS — M5010 Cervical disc disorder with radiculopathy, unspecified cervical region: Secondary | ICD-10-CM

## 2024-09-20 DIAGNOSIS — M5412 Radiculopathy, cervical region: Secondary | ICD-10-CM

## 2024-09-20 DIAGNOSIS — Z9889 Other specified postprocedural states: Secondary | ICD-10-CM

## 2024-09-20 NOTE — Progress Notes (Signed)
 PROVIDER NOTE: Interpretation of information contained herein should be left to medically-trained personnel. Specific patient instructions are provided elsewhere under Patient Instructions section of medical record. This document was created in part using AI and STT-dictation technology, any transcriptional errors that may result from this process are unintentional.  Patient: Daniel Calderon  Service: E/M   PCP: Dineen Rollene MATSU, FNP  DOB: [birth year]  DOS: 09/20/2024  Provider: Emmy MARLA Blanch, NP  MRN: [id number]  Delivery: Virtual Visit  Specialty: Interventional Pain Management  Type: Established Patient  Setting: Ambulatory outpatient facility  Specialty designation: 09  Referring Prov.: Dineen Rollene MATSU, FNP  Location: Remote location       Virtual Encounter - Pain Management PROVIDER NOTE: Information contained herein reflects review and annotations entered in association with encounter. Interpretation of such information and data should be left to medically-trained personnel. Information provided to patient can be located elsewhere in the medical record under Patient Instructions. Document created using STT-dictation technology, any transcriptional errors that may result from process are unintentional.    Contact & Pharmacy Preferred: [phone] Home: [phone] (home) Mobile: [phone] (mobile) E-mail: [email or web address]  [facility] [address] [place] [address] Phone: [phone] Fax: [phone]  [facility], [place], [place] - [address] [place] [address] Phone: [phone] Fax: [phone]  [facility] - [facility] [address] [place] [address] Phone: [phone] Fax: [phone]  [facility] - [place], [place] - [address] [place] [address] Phone: [phone] Fax:  [phone]   Pre-screening  Mr. Bastidas offered in-person vs virtual encounter. He indicated preferring virtual for this encounter.   Reason COVID-19*  Social distancing based on CDC and AMA recommendations.   I contacted Daniel Calderon on 09/20/2024 via telephone.      I clearly identified myself as Emmy MARLA Blanch, NP. I verified that I was speaking with the correct person using two identifiers (Name: Daniel Calderon, and date of birth: May 24, 1966).  Consent I sought verbal advanced consent from Daniel Calderon for virtual visit interactions. I informed Daniel Calderon of possible security and privacy concerns, risks, and limitations associated with providing not-in-person medical evaluation and management services. I also informed Daniel Calderon of the availability of in-person appointments. Finally, I informed him that there would be a charge for the virtual visit and that he could be  personally, fully or partially, financially responsible for it. Mr. Folson expressed understanding and agreed to proceed.   Historic Elements   Daniel Calderon is a 58 y.o. year old, male patient evaluated today after our last contact on Visit date not found. Mr. Si  has a past medical history of Allergy, Anxiety, Arthritis, Asthma, Benign enlargement of prostate, Colon polyps, Complex sleep apnea syndrome, Depression, Elevated BP, Elevated transaminase level, Failure of erection, Fatigue, Fatty liver, Frequent headaches, GERD (gastroesophageal reflux disease), History of kidney stones, Hyperlipidemia, Hypertension, Hypogonadism in male, Migraine, NAFL (nonalcoholic fatty liver), Obstructive sleep apnea treated with BiPAP, Psoriasis, Sinus congestion (09/01/2017), Splenomegaly, Thoracic outlet syndrome, and Tongue pain (02/09/2019). He also  has a past surgical history that includes Thoracic outlet surgery (Left, 2011); Scalene node biopsy / excision; Nasal septum surgery; nerve block;  Vasectomy; Colonoscopy with propofol  (N/A, 11/11/2017); polypectomy (11/11/2017); Knee arthroscopy with medial menisectomy (Left, 07/26/2018); Esophagogastroduodenoscopy (egd) with propofol  (N/A, 08/25/2019); Colonoscopy with propofol  (N/A,  10/14/2022); and Cervical disc arthroplasty (N/A, 02/03/2024). Daniel Calderon has a current medication list which includes the following prescription(s): amlodipine , cyanocobalamin , sotyktu , methocarbamol , methylprednisolone , omeprazole , sertraline , and ondansetron , and the following Facility-Administered Medications: phenylephrine  hcl (pressors). He  reports that he has never smoked. He has never used smokeless tobacco. He reports current alcohol use of about 10.0 standard drinks of alcohol per week. He reports that he does not use drugs. Daniel Calderon is allergic to erythromycin, dexamethasone , keflex [cephalexin], percocet [oxycodone -acetaminophen ], prednisone , and gabapentin .  BMI: Estimated body mass index is 26.96 kg/m as calculated from the following:   Height as of 08/17/24: 6' 2 (1.88 m).   Weight as of 08/17/24: 210 lb (95.3 kg). Last encounter: Visit date not found. Last procedure: 08/17/2024  HPI  Today, he is being contacted for a post-procedure assessment.  Daniel Calderon received a diagnostic/therapeutic Left trapezius, periscpular Trigger Point Injection on August 17, 2024.  He reports initially 20% pain relief during local anesthetic phase, followed sustained 100% pain relief with improvement in  ROM continuous since the procedure.  The patient reports pain level of 2/10 today, with further improvement in range of motion on the left side.   Procedure Type:  Left trapezius, periscpular Trigger Point Injection (Myoneural Block) (3+ muscle groups)  #1 (w/ steroids)  CPT: 20552 Laterality: LEFT   Imaging: N/A. Landmark-guided,           Anesthesia: Local anesthesia (1-2% Lidocaine ) DOS: 08/17/2024  Performed by: Wallie Sherry, MD   Medical  Necessity (reasoning)  Purpose: Diagnostic/Therapeutic Rationale (medical necessity): procedure needed and proper for the diagnosis and/or treatment of Mr. Dokken's medical symptoms and needs. Indications: Cervicalgia pain severe enough to impact quality of life and/or function. 1. Cervical radicular pain     NAS-11 Pain score:        Pre-procedure: 3 /10        Post-procedure: 2 /10   Post-Procedure Evaluation   Effectiveness:  Initial hour after procedure: 20 % . Subsequent 4-6 hours post-procedure: 20 % . Analgesia past initial 6 hours: 100 % . Ongoing improvement:  Analgesic:  Mr. Repetto received a diagnostic/therapeutic Left trapezius, periscpular Trigger Point Injection on August 17, 2024.  He reports initially 20% pain relief during local anesthetic phase, followed sustained 100% pain relief with improvement in  ROM continuous since the procedure.   Function: Mr. Mccollum reports improvement in function ROM: Mr. Pierpoint reports improvement in ROM   Pharmacotherapy Assessment Monitoring: [place] PMP: PDMP reviewed during this encounter.       Pharmacotherapy: No side-effects or adverse reactions reported. Compliance: No problems identified. Effectiveness: Clinically acceptable. Plan: Refer to POC.  UDS: No results found for: SUMMARY No results found for: CBDTHCR, D8THCCBX, D9THCCBX  Laboratory Chemistry Profile   Renal Lab Results  Component Value Date   BUN 22 (H) 01/18/2024   CREATININE 0.90 01/18/2024   BCR 16 08/27/2022   GFR 74.95 11/02/2023   GFRAA >60 04/25/2020   GFRNONAA >60 01/18/2024    Hepatic Lab Results  Component Value Date   AST 38 (H) 11/02/2023   ALT 65 (H) 11/02/2023   ALBUMIN 5.2 11/02/2023   ALKPHOS 84 11/02/2023   HCVAB <0.1 10/27/2017   LIPASE 32 08/18/2019    Electrolytes Lab Results  Component Value Date   NA 137 01/18/2024   K 3.9 01/18/2024   CL 103 01/18/2024   CALCIUM  9.7 01/18/2024    Bone Lab Results   Component Value Date   TESTOSTERONE  319 07/08/2023  Inflammation (CRP: Acute Phase) (ESR: Chronic Phase) Lab Results  Component Value Date   LATICACIDVEN 1.3 12/06/2019         Note: Above Lab results reviewed.  Imaging  DG Cervical Spine Complete CLINICAL DATA:  Post arthroplasty.  EXAM: CERVICAL SPINE - COMPLETE 4+ VIEW  COMPARISON:  CT 05/06/2024  FINDINGS: Straightening of normal lordosis. C6-C7 disc arthroplasty in unchanged alignment. No periprosthetic lucency. There is C5-C6 disc space narrowing and spurring. No evidence of fracture or suspicious bone lesion. Ossification of the spinous ligament posteriorly. No prevertebral soft tissue thickening.  IMPRESSION: 1. C6-C7 disc arthroplasty in unchanged alignment. 2. C5-C6 degenerative disc disease.  Electronically Signed   By: Andrea Gasman M.D.   On: 07/29/2024 17:28  Assessment  The primary encounter diagnosis was Cervical radicular pain. Diagnoses of Cervical disc disorder with radiculopathy of cervical region and Hx of cervical spine surgery were also pertinent to this visit.  Plan of Care  Problem-specific:  Cervical radicular pain: The patient continues to experience improvement on the left side following the trigger point injection, reporting a pain level of 2/10.  Cervical spine x-ray demonstrates C6-C7 disc arthroplasty without any alignment changes.  Degenerative changes are noted at C5-C6.  History of cervical spine surgery: The patient continues to recover from cervical arthroplasty performed on February 03, 2024.  He still experiences intermittent flares with weakness; however, trigger point injections continue to provide pain relief and functional improvement.  Mr. CABE LASHLEY has a current medication list which includes the following long-term medication(s): amlodipine , omeprazole , and sertraline .  Pharmacotherapy (Medications Ordered): No orders of the defined types were placed in  this encounter.  Orders:  No orders of the defined types were placed in this encounter.  Follow-up plan:   No follow-ups on file.          Recent Visits Date Type Provider Dept  08/17/24 Procedure visit Marcelino Nurse, MD [facility]  Showing recent visits within past 90 days and meeting all other requirements Today's Visits Date Type Provider Dept  09/20/24 Office Visit Estellar Cadena K, NP [facility]  Showing today's visits and meeting all other requirements Future Appointments No visits were found meeting these conditions. Showing future appointments within next 90 days and meeting all other requirements  I discussed the assessment and treatment plan with the patient. The patient was provided an opportunity to ask questions and all were answered. The patient agreed with the plan and demonstrated an understanding of the instructions.  Patient advised to call back or seek an in-person evaluation if the symptoms or condition worsens.  Duration of encounter: 20 minutes.  Note by: Emmy MARLA Blanch, NP Date: 09/20/2024; Time: 1:44 PM

## 2024-10-04 ENCOUNTER — Other Ambulatory Visit: Payer: Self-pay

## 2024-10-04 NOTE — Progress Notes (Signed)
 Specialty Pharmacy Ongoing Clinical Assessment Note  Daniel Calderon is a 58 y.o. male who is being followed by the specialty pharmacy service for RxSp Psoriasis   Patient's specialty medication(s) reviewed today: Deucravacitinib  (Sotyktu )   Missed doses in the last 4 weeks: 0   Patient/Caregiver did not have any additional questions or concerns.   Therapeutic benefit summary: Patient is achieving benefit   Adverse events/side effects summary: No adverse events/side effects   Patient's therapy is appropriate to: Continue    Goals Addressed             This Visit's Progress    Maintain optimal adherence to therapy   On track    Patient is on track. Patient will maintain adherence         Follow up: 12 months  [facility]

## 2024-10-14 ENCOUNTER — Other Ambulatory Visit: Payer: Self-pay

## 2024-10-17 ENCOUNTER — Other Ambulatory Visit: Payer: Self-pay

## 2024-10-18 ENCOUNTER — Other Ambulatory Visit: Payer: Self-pay

## 2024-10-18 MED ORDER — FLUZONE 0.5 ML IM SUSY
0.5000 mL | PREFILLED_SYRINGE | Freq: Once | INTRAMUSCULAR | 0 refills | Status: AC
  Filled 2024-10-18: qty 0.5, 1d supply, fill #0

## 2024-10-19 ENCOUNTER — Other Ambulatory Visit (HOSPITAL_COMMUNITY): Payer: Self-pay

## 2024-10-21 ENCOUNTER — Other Ambulatory Visit: Payer: Self-pay

## 2024-10-21 ENCOUNTER — Other Ambulatory Visit: Payer: Self-pay | Admitting: Pharmacy Technician

## 2024-10-21 NOTE — Progress Notes (Signed)
 Specialty Pharmacy Refill Coordination Note  Daniel Calderon is a 58 y.o. male contacted today regarding refills of specialty medication(s) Deucravacitinib  (Sotyktu )   Patient requested Delivery   Delivery date: 10/24/24   Verified address: [address]  [place]   Medication will be filled on: 10/21/24

## 2024-11-04 ENCOUNTER — Ambulatory Visit (INDEPENDENT_AMBULATORY_CARE_PROVIDER_SITE_OTHER): Payer: 59 | Admitting: Family

## 2024-11-04 ENCOUNTER — Telehealth: Payer: Self-pay

## 2024-11-04 ENCOUNTER — Encounter: Payer: Self-pay | Admitting: Family

## 2024-11-04 ENCOUNTER — Other Ambulatory Visit: Payer: Self-pay

## 2024-11-04 VITALS — BP 118/64 | HR 87 | Temp 97.8°F | Ht 74.0 in | Wt 211.4 lb

## 2024-11-04 DIAGNOSIS — F419 Anxiety disorder, unspecified: Secondary | ICD-10-CM

## 2024-11-04 DIAGNOSIS — Z0001 Encounter for general adult medical examination with abnormal findings: Secondary | ICD-10-CM

## 2024-11-04 DIAGNOSIS — L409 Psoriasis, unspecified: Secondary | ICD-10-CM | POA: Diagnosis not present

## 2024-11-04 DIAGNOSIS — Z136 Encounter for screening for cardiovascular disorders: Secondary | ICD-10-CM

## 2024-11-04 DIAGNOSIS — Z8249 Family history of ischemic heart disease and other diseases of the circulatory system: Secondary | ICD-10-CM

## 2024-11-04 DIAGNOSIS — R413 Other amnesia: Secondary | ICD-10-CM | POA: Diagnosis not present

## 2024-11-04 DIAGNOSIS — I10 Essential (primary) hypertension: Secondary | ICD-10-CM | POA: Diagnosis not present

## 2024-11-04 DIAGNOSIS — K219 Gastro-esophageal reflux disease without esophagitis: Secondary | ICD-10-CM

## 2024-11-04 DIAGNOSIS — Z23 Encounter for immunization: Secondary | ICD-10-CM

## 2024-11-04 DIAGNOSIS — G4733 Obstructive sleep apnea (adult) (pediatric): Secondary | ICD-10-CM | POA: Diagnosis not present

## 2024-11-04 DIAGNOSIS — Z0000 Encounter for general adult medical examination without abnormal findings: Secondary | ICD-10-CM

## 2024-11-04 DIAGNOSIS — Z13220 Encounter for screening for lipoid disorders: Secondary | ICD-10-CM

## 2024-11-04 DIAGNOSIS — Z125 Encounter for screening for malignant neoplasm of prostate: Secondary | ICD-10-CM

## 2024-11-04 LAB — COMPREHENSIVE METABOLIC PANEL WITH GFR
ALT: 37 U/L (ref 0–53)
AST: 29 U/L (ref 0–37)
Albumin: 4.9 g/dL (ref 3.5–5.2)
Alkaline Phosphatase: 76 U/L (ref 39–117)
BUN: 18 mg/dL (ref 6–23)
CO2: 29 meq/L (ref 19–32)
Calcium: 9.5 mg/dL (ref 8.4–10.5)
Chloride: 107 meq/L (ref 96–112)
Creatinine, Ser: 0.97 mg/dL (ref 0.40–1.50)
GFR: 87.49 mL/min (ref >60.00)
Glucose, Bld: 90 mg/dL (ref 70–99)
Potassium: 4.1 meq/L (ref 3.5–5.1)
Sodium: 142 meq/L (ref 135–145)
Total Bilirubin: 0.6 mg/dL (ref 0.2–1.2)
Total Protein: 6.5 g/dL (ref 6.0–8.3)

## 2024-11-04 LAB — PSA: PSA: 0.26 ng/mL (ref 0.10–4.00)

## 2024-11-04 LAB — B12 AND FOLATE PANEL
Folate: 16.6 ng/mL (ref >5.9)
Vitamin B-12: 743 pg/mL (ref 211–911)

## 2024-11-04 LAB — LIPID PANEL
Cholesterol: 166 mg/dL (ref 0–200)
HDL: 40.2 mg/dL (ref >39.00)
LDL Cholesterol: 103 mg/dL — ABNORMAL HIGH (ref 0–99)
NonHDL: 125.65
Total CHOL/HDL Ratio: 4
Triglycerides: 114 mg/dL (ref 0.0–149.0)
VLDL: 22.8 mg/dL (ref 0.0–40.0)

## 2024-11-04 LAB — TSH: TSH: 1.32 u[IU]/mL (ref 0.35–5.50)

## 2024-11-04 MED ORDER — OMEPRAZOLE 20 MG PO CPDR
20.0000 mg | DELAYED_RELEASE_CAPSULE | Freq: Every day | ORAL | 3 refills | Status: AC
  Filled 2024-11-04 – 2025-01-01 (×2): qty 90, 90d supply, fill #0

## 2024-11-04 MED ORDER — SERTRALINE HCL 100 MG PO TABS
100.0000 mg | ORAL_TABLET | Freq: Every day | ORAL | 3 refills | Status: AC
  Filled 2024-11-04 – 2024-11-21 (×2): qty 90, 90d supply, fill #0

## 2024-11-04 MED ORDER — AMLODIPINE BESYLATE 10 MG PO TABS
10.0000 mg | ORAL_TABLET | Freq: Every day | ORAL | 3 refills | Status: AC
Start: 2024-11-04 — End: 2025-11-04
  Filled 2024-11-04 – 2024-11-14 (×2): qty 90, 90d supply, fill #0

## 2024-11-04 NOTE — Patient Instructions (Signed)
 Ordered MRI brain  Let us  know if you dont hear back within 2 weeks in regards to an appointment being scheduled.   So that you are aware, if you are [facility] MyChart user , please pay attention to your MyChart messages as you may receive a MyChart message with a phone number to call and schedule this test/appointment own your own from our referral coordinator. This is a new process so I do not want you to miss this message.  If you are not a MyChart user, you will receive a phone call.    Health Maintenance, Male Adopting a healthy lifestyle and getting preventive care are important in promoting health and wellness. Ask your health care provider about: The right schedule for you to have regular tests and exams. Things you can do on your own to prevent diseases and keep yourself healthy. What should I know about diet, weight, and exercise? Eat a healthy diet  Eat a diet that includes plenty of vegetables, fruits, low-fat dairy products, and lean protein. Do not eat a lot of foods that are high in solid fats, added sugars, or sodium. Maintain a healthy weight Body mass index (BMI) is a measurement that can be used to identify possible weight problems. It estimates body fat based on height and weight. Your health care provider can help determine your BMI and help you achieve or maintain a healthy weight. Get regular exercise Get regular exercise. This is one of the most important things you can do for your health. Most adults should: Exercise for at least 150 minutes each week. The exercise should increase your heart rate and make you sweat (moderate-intensity exercise). Do strengthening exercises at least twice a week. This is in addition to the moderate-intensity exercise. Spend less time sitting. Even light physical activity can be beneficial. Watch cholesterol and blood lipids Have your blood tested for lipids and cholesterol at 56 years of age, then have this test every 5 years. You may need  to have your cholesterol levels checked more often if: Your lipid or cholesterol levels are high. You are older than 56 years of age. You are at high risk for heart disease. What should I know about cancer screening? Many types of cancers can be detected early and may often be prevented. Depending on your health history and family history, you may need to have cancer screening at various ages. This may include screening for: Colorectal cancer. Prostate cancer. Skin cancer. Lung cancer. What should I know about heart disease, diabetes, and high blood pressure? Blood pressure and heart disease High blood pressure causes heart disease and increases the risk of stroke. This is more likely to develop in people who have high blood pressure readings or are overweight. Talk with your health care provider about your target blood pressure readings. Have your blood pressure checked: Every 3-5 years if you are 23-57 years of age. Every year if you are 77 years old or older. If you are between the ages of 63 and 23 and are a current or former smoker, ask your health care provider if you should have a one-time screening for abdominal aortic aneurysm (AAA). Diabetes Have regular diabetes screenings. This checks your fasting blood sugar level. Have the screening done: Once every three years after age 9 if you are at a normal weight and have a low risk for diabetes. More often and at a younger age if you are overweight or have a high risk for diabetes. What should I know  about preventing infection? Hepatitis B If you have a higher risk for hepatitis B, you should be screened for this virus. Talk with your health care provider to find out if you are at risk for hepatitis B infection. Hepatitis C Blood testing is recommended for: Everyone born from [birth year] through 1963. Anyone with known risk factors for hepatitis C. Sexually transmitted infections (STIs) You should be screened each year for STIs, including  gonorrhea and chlamydia, if: You are sexually active and are younger than 56 years of age. You are older than 56 years of age and your health care provider tells you that you are at risk for this type of infection. Your sexual activity has changed since you were last screened, and you are at increased risk for chlamydia or gonorrhea. Ask your health care provider if you are at risk. Ask your health care provider about whether you are at high risk for HIV. Your health care provider may recommend a prescription medicine to help prevent HIV infection. If you choose to take medicine to prevent HIV, you should first get tested for HIV. You should then be tested every 3 months for as long as you are taking the medicine. Follow these instructions at home: Alcohol use Do not drink alcohol if your health care provider tells you not to drink. If you drink alcohol: Limit how much you have to 0-2 drinks a day. Know how much alcohol is in your drink. In the [place] one drink equals one 12 oz bottle of beer (355 mL), one 5 oz glass of wine (148 mL), or one 1 oz glass of hard liquor (44 mL). Lifestyle Do not use any products that contain nicotine or tobacco. These products include cigarettes, chewing tobacco, and vaping devices, such as e-cigarettes. If you need help quitting, ask your health care provider. Do not use street drugs. Do not share needles. Ask your health care provider for help if you need support or information about quitting drugs. General instructions Schedule regular health, dental, and eye exams. Stay current with your vaccines. Tell your health care provider if: You often feel depressed. You have ever been abused or do not feel safe at home. Summary Adopting a healthy lifestyle and getting preventive care are important in promoting health and wellness. Follow your health care provider's instructions about healthy diet, exercising, and getting tested or screened for diseases. Follow your  health care provider's instructions on monitoring your cholesterol and blood pressure. This information is not intended to replace advice given to you by your health care provider. Make sure you discuss any questions you have with your health care provider. Document Revised: 04/29/2021 Document Reviewed: 04/29/2021 Elsevier Patient Education  2024 [employer].

## 2024-11-04 NOTE — Telephone Encounter (Signed)
 Noted

## 2024-11-04 NOTE — Telephone Encounter (Signed)
 Copied from CRM #[id number]. Topic: General - Running Late >> Nov 04, 2024  8:35 AM Mercedes MATSU wrote: Patient/patient representative is calling because they are running late for an appointment.  Patient is aware of 10 min grace period and knows if he arrives after 840 he will be rescheduled upon arrival.  Patient has arrived.

## 2024-11-04 NOTE — Progress Notes (Unsigned)
 Assessment & Plan:  Annual physical exam Assessment & Plan: Encouraged continued exercise.  Politely declines tetanus, pneumonia vaccine today.   Primary hypertension -     amLODIPine  Besylate; Take 1 tablet (10 mg total) by mouth daily.  Dispense: 90 tablet; Refill: 3  Gastroesophageal reflux disease, unspecified whether esophagitis present -     Omeprazole ; Take 1 capsule (20 mg total) by mouth daily.  Dispense: 90 capsule; Refill: 3  Screening for prostate cancer -     PSA  Family history of ASCVD -     Lipid panel  Encounter for lipid screening for cardiovascular disease -     Lipid panel  Encounter for general adult medical examination with abnormal findings Assessment & Plan: Encouraged continued exercise.  Politely declines tetanus, pneumonia vaccine today.  Orders: -     Comprehensive metabolic panel with GFR -     Hemoglobin A1c  Anxiety -     Sertraline  HCl; Take 1 tablet (100 mg total) by mouth daily.  Dispense: 90 tablet; Refill: 3  Psoriasis  Need for hepatitis vaccination  Memory changes Assessment & Plan: MMSE 30/30. Of note: discussed scoring and how to perform test with CMA. Patient did not miss any questions.    Pending labs, MRI brain and close follow up.   Orders: -     MR BRAIN W WO CONTRAST; Future -     B12 and Folate Panel -     TSH  OSA on CPAP Assessment & Plan: Compliant with BiPAP.  No following with pulmonology.  Discussed consideration for titration study in the setting of nocturia.  He politely declines at this time.      Return precautions given.   Risks, benefits, and alternatives of the medications and treatment plan prescribed today were discussed, and patient expressed understanding.   Education regarding symptom management and diagnosis given to patient on AVS either electronically or printed.  Return in about 6 weeks (around 12/16/2024).  Rollene Northern, FNP  Subjective:    Patient ID: Daniel Calderon, Daniel Calderon     DOB: 05-27-1966, 58 y.o.   MRN: [id number]  CC: Daniel Calderon is a 58 y.o. Daniel Calderon who presents today for physical exam.    HPI: HPI Discussed the use of AI scribe software for clinical note transcription with the patient, who gave verbal consent to proceed.  History of Present Illness   Daniel Calderon is a 58 year old Daniel Calderon who presents with memory concerns during his annual physical exam.  He has been experiencing memory issues, particularly forgetting names and details during conversations and meetings. He knows what he wants to say but fails to verbalize it, and sometimes forgets the names of companies or people he is discussing. He has been in his current role for four years and feels that his memory has been worsening over the past couple of years. No acute change.   His colleagues have noticed these changes, and he sometimes forgets where he is driving to.  Denies depression.   He reports sleeping well since switching from CPAP to BiPAP.  He does not feel fatigued in the morning but describes his sleep quality as not optimal. No significant weight changes.  He has a history of an enlarged prostate but reports no significant urinary symptoms during the day. He wakes up three times a night to urinate.  Denies decreased stream. no family history of prostate cancer.  He is active, engaging in yard work and building projects,  although he avoids running due to knee and back pain. He does not consider himself sedentary.       Colorectal  Cancer Screening: family h/o colon cancer  UTD , 09/2022; repeat in 5 years Prostate Cancer Screening:  H/o enlarged prostate   lung Cancer Screening: No 30 year pack year history and > 50 years to 80 years.   No family history of AAA.  Immunizations       Tetanus - due; declines        Pneumococcal - Candidate for; declines  Exercise: Gets regular exercise.   Alcohol use:  daily Smoking/tobacco use: Nonsmoker.    Health Maintenance   Topic Date Due   Pneumococcal Vaccine for age over 71 (1 of 2 - PCV) Never done   Zoster (Shingles) Vaccine (1 of 2) Never done   DTaP/Tdap/Td vaccine (2 - Td or Tdap) 12/24/2023   COVID-19 Vaccine (4 - 2025-26 season) 11/20/2024*   Colon Cancer Screening  10/15/2027   Flu Shot  Completed   Hepatitis C Screening  Completed   HIV Screening  Completed   HPV Vaccine  Aged Out   Meningitis B Vaccine  Aged Out   Hepatitis B Vaccine  Discontinued  *Topic was postponed. The date shown is not the original due date.     ALLERGIES: Erythromycin, Dexamethasone , Keflex [cephalexin], Percocet [oxycodone -acetaminophen ], Prednisone , and Gabapentin   Current Outpatient Medications on File Prior to Visit  Medication Sig Dispense Refill   cyanocobalamin  (VITAMIN B12) 1000 MCG tablet Take 1,000 mcg by mouth daily.     Deucravacitinib  (SOTYKTU ) 6 MG TABS Take one tablet by mouth once daily. 30 tablet 3   ondansetron  (ZOFRAN -ODT) 4 MG disintegrating tablet Take 1 tablet (4 mg total) by mouth every 8 (eight) hours as needed for nausea or vomiting. 20 tablet 0   Current Facility-Administered Medications on File Prior to Visit  Medication Dose Route Frequency Provider Last Rate Last Admin   phenylephrine  100 mcg/mL CONC. dilution injection for priapism (Outpatient [facility] USE ONLY)  200 mcg Intracavernosal Once         Review of Systems  Constitutional:  Negative for chills and fever.  Eyes:  Negative for visual disturbance.  Respiratory:  Negative for cough.   Cardiovascular:  Negative for chest pain and palpitations.  Gastrointestinal:  Negative for nausea and vomiting.  Genitourinary:  Negative for difficulty urinating.  Neurological:  Negative for dizziness and headaches.  Psychiatric/Behavioral:  Negative for sleep disturbance.       Objective:    BP 118/64   Pulse 87   Temp 97.8 F (36.6 C) (Oral)   Ht 6' 2 (1.88 m)   Wt 211 lb 6.4 oz (95.9 kg)   SpO2 97%   BMI 27.14  kg/m   BP Readings from Last 3 Encounters:  11/04/24 118/64  08/17/24 (!) 128/98  07/28/24 (!) 144/96   Wt Readings from Last 3 Encounters:  11/04/24 211 lb 6.4 oz (95.9 kg)  08/17/24 210 lb (95.3 kg)  07/28/24 216 lb (98 kg)    Physical Exam Vitals reviewed.  Constitutional:      Appearance: He is well-developed.  HENT:     Right Ear: Hearing normal.     Left Ear: Hearing normal.     Mouth/Throat:     Pharynx: Uvula midline. No posterior oropharyngeal erythema.  Eyes:     General: Lids are normal. Lids are everted, no foreign bodies appreciated.     Conjunctiva/sclera: Conjunctivae normal.  Pupils: Pupils are equal, round, and reactive to light.     Comments: Normal fundus bilaterally.  Neck:     Thyroid : No thyroid  mass or thyromegaly.  Cardiovascular:     Rate and Rhythm: Regular rhythm.     Heart sounds: Normal heart sounds.  Pulmonary:     Effort: Pulmonary effort is normal. No respiratory distress.     Breath sounds: Normal breath sounds. No wheezing, rhonchi or rales.  Lymphadenopathy:     Head:     Right side of head: No submental, submandibular, tonsillar, preauricular, posterior auricular or occipital adenopathy.     Left side of head: No submental, submandibular, tonsillar, preauricular, posterior auricular or occipital adenopathy.     Cervical: No cervical adenopathy.  Skin:    General: Skin is warm and dry.  Neurological:     Mental Status: He is alert.     Cranial Nerves: No cranial nerve deficit.     Sensory: No sensory deficit.     Deep Tendon Reflexes:     Reflex Scores:      Bicep reflexes are 2+ on the right side and 2+ on the left side.      Patellar reflexes are 2+ on the right side and 2+ on the left side.    Comments: Grip equal and strong bilateral upper extremities. Gait strong and steady. Able to perform rapid alternating movement without difficulty.  Psychiatric:        Speech: Speech normal.        Behavior: Behavior normal.

## 2024-11-07 LAB — HEMOGLOBIN A1C: Hgb A1c MFr Bld: 4.5 % — ABNORMAL LOW (ref 4.6–6.5)

## 2024-11-07 NOTE — Assessment & Plan Note (Addendum)
 MMSE 30/30. Of note: discussed scoring and how to perform test with CMA. Patient did not miss any questions.    Pending labs, MRI brain and close follow up.

## 2024-11-07 NOTE — Assessment & Plan Note (Signed)
 Encouraged continued exercise.  Politely declines tetanus, pneumonia vaccine today.

## 2024-11-09 ENCOUNTER — Other Ambulatory Visit: Payer: Self-pay

## 2024-11-09 NOTE — Assessment & Plan Note (Signed)
 Compliant with BiPAP.  No following with pulmonology.  Discussed consideration for titration study in the setting of nocturia.  He politely declines at this time.

## 2024-11-10 ENCOUNTER — Ambulatory Visit: Payer: Self-pay | Admitting: Family

## 2024-11-14 ENCOUNTER — Other Ambulatory Visit: Payer: Self-pay

## 2024-11-15 ENCOUNTER — Other Ambulatory Visit: Payer: Self-pay

## 2024-11-15 ENCOUNTER — Ambulatory Visit: Admitting: Dermatology

## 2024-11-15 DIAGNOSIS — G4733 Obstructive sleep apnea (adult) (pediatric): Secondary | ICD-10-CM | POA: Diagnosis not present

## 2024-11-16 ENCOUNTER — Other Ambulatory Visit: Payer: Self-pay

## 2024-11-16 NOTE — Progress Notes (Signed)
 Specialty Pharmacy Refill Coordination Note  Daniel Calderon is a 58 y.o. male contacted today regarding refills of specialty medication(s) Deucravacitinib  (Sotyktu )   Patient requested Delivery   Delivery date: 11/22/24   Verified address: [address]  [facility] [place]   Medication will be filled on: 11/21/24

## 2024-11-21 ENCOUNTER — Other Ambulatory Visit: Payer: Self-pay

## 2024-11-21 ENCOUNTER — Ambulatory Visit
Admission: RE | Admit: 2024-11-21 | Discharge: 2024-11-21 | Disposition: A | Source: Ambulatory Visit | Attending: Family

## 2024-11-21 DIAGNOSIS — R9082 White matter disease, unspecified: Secondary | ICD-10-CM | POA: Diagnosis not present

## 2024-11-21 DIAGNOSIS — R413 Other amnesia: Secondary | ICD-10-CM | POA: Insufficient documentation

## 2024-11-21 MED ORDER — GADOBUTROL 1 MMOL/ML IV SOLN
9.0000 mL | Freq: Once | INTRAVENOUS | Status: AC | PRN
  Administered 2024-11-21: 9 mL via INTRAVENOUS

## 2024-12-01 ENCOUNTER — Telehealth: Admitting: Physician Assistant

## 2024-12-01 DIAGNOSIS — B9689 Other specified bacterial agents as the cause of diseases classified elsewhere: Secondary | ICD-10-CM

## 2024-12-01 NOTE — Progress Notes (Signed)
° °  Thank you for the details you included in the comment boxes. Those details are very helpful in determining the best course of treatment for you and help us  to provide the best care.Because of continued symptoms despite recent antibiotics, we recommend that you schedule a Virtual Urgent Care video visit in order for the provider to better assess what is going on.  The provider will be able to give you a more accurate diagnosis and treatment plan if we can more freely discuss your symptoms and with the addition of a virtual examination.   If you change your visit to a video visit, we will bill your insurance (similar to an office visit) and you will not be charged for this e-Visit. You will be able to stay at home and speak with the first available [facility] advanced practice provider. The link to do a video visit is in the drop down Menu tab of your Welcome screen in MyChart.

## 2024-12-02 ENCOUNTER — Telehealth: Admitting: Family Medicine

## 2024-12-02 NOTE — Progress Notes (Signed)
 Pt did not show for visit [facility]

## 2024-12-13 ENCOUNTER — Other Ambulatory Visit: Payer: Self-pay | Admitting: Dermatology

## 2024-12-13 ENCOUNTER — Other Ambulatory Visit: Payer: Self-pay

## 2024-12-14 ENCOUNTER — Other Ambulatory Visit: Payer: Self-pay

## 2024-12-16 ENCOUNTER — Other Ambulatory Visit: Payer: Self-pay

## 2024-12-16 ENCOUNTER — Telehealth: Admitting: Family Medicine

## 2024-12-16 DIAGNOSIS — J019 Acute sinusitis, unspecified: Secondary | ICD-10-CM | POA: Diagnosis not present

## 2024-12-16 DIAGNOSIS — B9689 Other specified bacterial agents as the cause of diseases classified elsewhere: Secondary | ICD-10-CM

## 2024-12-16 MED ORDER — LEVOFLOXACIN 500 MG PO TABS: 500.0000 mg | ORAL_TABLET | Freq: Every day | ORAL | 0 refills | Status: AC

## 2024-12-16 NOTE — Patient Instructions (Signed)

## 2024-12-16 NOTE — Progress Notes (Signed)
 " Virtual Visit Consent   Daniel Calderon, you are scheduled for a virtual visit with a [facility] provider today. Just as with appointments in the office, your consent must be obtained to participate. Your consent will be active for this visit and any virtual visit you may have with one of our providers in the next 365 days. If you have a MyChart account, a copy of this consent can be sent to you electronically.  As this is a virtual visit, video technology does not allow for your provider to perform a traditional examination. This may limit your provider's ability to fully assess your condition. If your provider identifies any concerns that need to be evaluated in person or the need to arrange testing (such as labs, EKG, etc.), we will make arrangements to do so. Although advances in technology are sophisticated, we cannot ensure that it will always work on either your end or our end. If the connection with a video visit is poor, the visit may have to be switched to a telephone visit. With either a video or telephone visit, we are not always able to ensure that we have a secure connection.  By engaging in this virtual visit, you consent to the provision of healthcare and authorize for your insurance to be billed (if applicable) for the services provided during this visit. Depending on your insurance coverage, you may receive a charge related to this service.  I need to obtain your verbal consent now. Are you willing to proceed with your visit today? Daniel Calderon has provided verbal consent on 12/16/2024 for a virtual visit (video or telephone). Daniel Lamp, FNP  Date: 12/16/2024 12:17 PM   Virtual Visit via Video Note   I, Daniel Calderon, connected with  Daniel Calderon  ([id number], 02/04/1966) on 12/16/2024 at 12:15 PM EST by a video-enabled telemedicine application and verified that I am speaking with the correct person using two identifiers.  Location: Patient: Home Provider: Virtual  Visit Location Provider: Home Office   I discussed the limitations of evaluation and management by telemedicine and the availability of in person appointments. The patient expressed understanding and agreed to proceed.    History of Present Illness: Daniel Calderon is a 58 y.o. who identifies as a male who was assigned male at birth, and is being seen today for MRI 3 weeks ago showing sinus infection. He took augmentin  for a week pending apptmt and it helped but now coming back. Daniel Calderon  HPI: HPI  Problems:  Patient Active Problem List   Diagnosis Date Noted   Cervical disc disorder with radiculopathy of cervical region 03/24/2024   Hx of cervical spine surgery 03/24/2024   Right arm weakness 02/03/2024   Cervical radicular pain 02/03/2024   Hepatic steatosis 02/03/2024   Abnormal chest CT 01/08/2024   Memory changes 11/02/2023   Anxiety 11/02/2023   Family history of ASCVD 11/02/2023   GERD (gastroesophageal reflux disease) 11/02/2023   History of colonic polyps    Family history of colon cancer    Annual physical exam 10/06/2022   Splenomegaly 10/03/2022   Left groin pain 09/30/2022   Asthma 07/21/2022   Nausea 04/08/2022   Tremor 04/08/2022   ASIS pain 04/08/2022   Left sided numbness 02/13/2020   Hyperlipidemia 12/07/2019   Radiculopathy, cervical and lumbar 12/06/2019   Abdominal pain, epigastric    Left flank pain 08/15/2019   Elevated LFTs 08/10/2019   Hypogammaglobulinemia 05/17/2019   Excessive daytime sleepiness 03/28/2019  Cataplexy 03/28/2019   OSA on CPAP 03/28/2019   Acute pain of left knee 04/18/2018   Cutaneous skin tags 04/18/2018   Lower extremity edema 04/18/2018   Thoracic outlet syndrome 02/19/2018   Pain in limb 02/19/2018   Muscle strain 09/01/2017   Chest pain 03/21/2017   Dyspnea on exertion 02/19/2017   Language difficulty 02/19/2017   BPH with obstruction/lower urinary tract symptoms 12/03/2015   Medication refill 09/13/2015   Erectile  disorder, generalized, mild 07/26/2015   Hypogonadism in male 05/24/2015   Environmental allergies 04/13/2015   Sleep apnea 04/13/2015   Migraines 04/13/2015   Neck pain 04/13/2015   Adjustment disorder with mixed anxiety and depressed mood 04/13/2015   Psoriasis 04/13/2015   History of kidney stones 04/13/2015   Neuropathy 04/13/2015   Benign fibroma of prostate 03/24/2015   Hypertension 03/24/2015   Elevation of level of transaminase or lactic acid dehydrogenase (LDH) 03/24/2015   Fatigue 03/24/2015    Allergies: Allergies[1] Medications: Current Medications[2]  Observations/Objective: Patient is well-developed, well-nourished in no acute distress.  Resting comfortably  at home.  Head is normocephalic, atraumatic.  No labored breathing.  Speech is clear and coherent with logical content.  Patient is alert and oriented at baseline.    Assessment and Plan: There are no diagnoses linked to this encounter.   Follow Up Instructions: I discussed the assessment and treatment plan with the patient. The patient was provided an opportunity to ask questions and all were answered. The patient agreed with the plan and demonstrated an understanding of the instructions.  A copy of instructions were sent to the patient via MyChart unless otherwise noted below.     The patient was advised to call back or seek an in-person evaluation if the symptoms worsen or if the condition fails to improve as anticipated.    Daniel Reeser, FNP     [1]  Allergies Allergen Reactions   Erythromycin Nausea And Vomiting   Dexamethasone  Nausea And Vomiting   Keflex [Cephalexin] Other (See Comments)    Turned Pt tanned or orange   Percocet [Oxycodone -Acetaminophen ] Other (See Comments)    Very sensitive - Makes Pt very sedated    Prednisone  Other (See Comments)    Makes pt upset or irritated   Gabapentin  Other (See Comments)    Aggressive  [2]  Current Outpatient Medications:    amLODipine   (NORVASC ) 10 MG tablet, Take 1 tablet (10 mg total) by mouth daily., Disp: 90 tablet, Rfl: 3   cyanocobalamin  (VITAMIN B12) 1000 MCG tablet, Take 1,000 mcg by mouth daily., Disp: , Rfl:    Deucravacitinib  (SOTYKTU ) 6 MG TABS, Take one tablet by mouth once daily., Disp: 30 tablet, Rfl: 3   omeprazole  (PRILOSEC) 20 MG capsule, Take 1 capsule (20 mg total) by mouth daily., Disp: 90 capsule, Rfl: 3   ondansetron  (ZOFRAN -ODT) 4 MG disintegrating tablet, Take 1 tablet (4 mg total) by mouth every 8 (eight) hours as needed for nausea or vomiting., Disp: 20 tablet, Rfl: 0   sertraline  (ZOLOFT ) 100 MG tablet, Take 1 tablet (100 mg total) by mouth daily., Disp: 90 tablet, Rfl: 3  Current Facility-Administered Medications:    phenylephrine  100 mcg/mL CONC. dilution injection for priapism ([facility]patient [facility] USE ONLY), 200 mcg, Intracavernosal, Once,   "

## 2024-12-19 ENCOUNTER — Other Ambulatory Visit (HOSPITAL_COMMUNITY): Payer: Self-pay

## 2024-12-26 ENCOUNTER — Other Ambulatory Visit (HOSPITAL_COMMUNITY): Payer: Self-pay

## 2024-12-26 ENCOUNTER — Ambulatory Visit: Attending: Family | Admitting: Pharmacist

## 2024-12-26 ENCOUNTER — Ambulatory Visit

## 2024-12-26 ENCOUNTER — Encounter: Payer: Self-pay | Admitting: Pharmacist

## 2024-12-26 ENCOUNTER — Other Ambulatory Visit: Payer: Self-pay

## 2024-12-26 DIAGNOSIS — L409 Psoriasis, unspecified: Secondary | ICD-10-CM

## 2024-12-26 DIAGNOSIS — I781 Nevus, non-neoplastic: Secondary | ICD-10-CM

## 2024-12-26 DIAGNOSIS — Z79899 Other long term (current) drug therapy: Secondary | ICD-10-CM | POA: Diagnosis not present

## 2024-12-26 MED ORDER — CLOBETASOL PROPIONATE 0.05 % EX OINT
TOPICAL_OINTMENT | CUTANEOUS | 5 refills | Status: AC
  Filled 2024-12-26: qty 15, 15d supply, fill #0

## 2024-12-26 MED ORDER — SOTYKTU 6 MG PO TABS
ORAL_TABLET | ORAL | 3 refills | Status: DC
  Filled 2024-12-26: qty 30, fill #0

## 2024-12-26 MED ORDER — SOTYKTU 6 MG PO TABS
6.0000 mg | ORAL_TABLET | Freq: Every day | ORAL | 3 refills | Status: AC
  Filled 2024-12-26: qty 30, fill #0
  Filled 2024-12-27: qty 30, 30d supply, fill #0
  Filled 2025-01-20: qty 30, 30d supply, fill #1

## 2024-12-26 NOTE — Addendum Note (Signed)
 Addended by: Binta Statzer, JACKIE V on: 12/26/2024 11:17 AM   Modules accepted: Orders

## 2024-12-26 NOTE — Progress Notes (Signed)
 "   Subjective   Daniel Calderon is a 59 y.o. male who presents for the following: Follow up of psorarisis. Patient is established patient .  Today patient reports: Patient is present for Sotyktu  refill. He states due to the cold weather his knees have been bleeding but not as much as before. He states no negative side effects. Still with flares. Does not have prescription topicals. Has joint pain, has not seen rheumatology.    Review of Systems:    No other skin or systemic complaints except as noted in HPI or Assessment and Plan.  The following portions of the chart were reviewed this encounter and updated as appropriate: medications, allergies, medical history  Relevant Medical History:  n/a   Objective  (SKPE) Well appearing patient in no apparent distress; mood and affect are within normal limits. Examination was performed of the: Focused Exam of: Lower extremities, face    Examination notable for: Psoriasis: Well circumscribed erythematous papules and plaques with overlying silvery scale on flexural surfaces including elbows, knees, gluteal cleft   Examination limited by: Undergarments, Shoes or socks , and Clothing     Assessment & Plan  (SKAP)   Follow up of psoraisis   Psoriasis w/ c/f PsA  Chronic and persistent condition with duration or expected duration over one year. Condition is symptomatic and bothersome to patient. Patient is flaring and not currently at treatment goal.  - Prior treatments: Otezla  (GI sx), Skyrizi  (stopped working), Bimselx (improved but noted anger/mood issues), prescribed tremfya  but never received  - Has overall had best response/improvement on Sotykty with minimal side effects - still with intermittent persistent lesions, has not tried any topicals. Endorsing joint pain - Discussed association with psoriatic arthritis, monitor for increasing joint pain/stiffness, red/hot swollen fingers or joints; discussed if develops joint involvement will  need referral to Rheumatology and possible escalation of therapy  - Referred to rheum d/t c/f PsA  - Discussed different treatments including topical corticosteroids, and/or topical vitamin D vs nbUVB vs systemic therapy (CsA, MTX, etanercept, adalimumab, ustekinumab, ixekizumab, apremilast ) - Start Clobetasol  ointment 0.05% twice daily for 2 weeks to thick plaques. Can similarly retreat for flares. - Discussed referral to rheumatology for consult given joint pain.  - Lab slip given for updated quantiferon labs.  - Continue deucravacitinib  (Sotyktu )   - Indications: moderate to severe plaque psoriasis   - Contraindications: prior hypersensitivity, other immunosuppressants  - No baseline labs indicate  - We discussed possible side effects including increased risk of infection (pneumonia, COVID), malignancy/lymphoma risk (avoid with hx of malignancy), rhabdomyolysis and elevated CPK, elevation of liver enzymes/triglycerides. Potential risks related to JAK inhibition (cardiac events, thrombosis)  - Avoid use of live vaccines  - Reviewed 10/2024 CMP, lipid panel - wnl - Repeat quantiferon gold  - Dosing: 6 mg daily  - Sample box given to patient. NDC: 9996-9104-08 Lot: [id number] Exp: 03/2026  Telangiectasis and telangiectasia of the face Discussed condition, chronicity, benign nature of spots. For cosmesis, best approach is vascular laser. Considered cosmetic service.   Was sun protection counseling provided?: No   Level of service outlined above   Patient instructions (SKPI)   Procedures, orders, diagnosis for this visit:  PSORIASIS   This Visit - QuantiFERON-TB Gold Plus HIGH RISK MEDICATION USE   TELANGIECTASIA    Psoriasis -     QuantiFERON-TB Gold Plus  High risk medication use  Telangiectasia  Other orders -     Clobetasol  Propionate; Apply 1 gram topically to  affected area of skin twice daily. Stop once resolved and restart as needed for flares. Avoid use on face,  armpits, groin unless otherwise indicated.  Dispense: 60 g; Refill: 5 -     Sotyktu ; Take one tablet by mouth once daily.  Dispense: 30 tablet; Refill: 3    Return to clinic: Return for 4-6 months .  I, Almetta Nora, RMA, am acting as scribe for Lauraine JAYSON Kanaris, MD .   Documentation: I have reviewed the above documentation for accuracy and completeness, and I agree with the above.  Lauraine JAYSON Kanaris, MD  "

## 2024-12-26 NOTE — Patient Instructions (Signed)

## 2024-12-26 NOTE — Progress Notes (Signed)
" ° °  S: Patient presents today for review of their specialty medication.   Patient is currently taking Sotyktu  for psoriasis. Patient is managed by Dr. Jackquline for this.   Dosing: Plaque Psoriasis: 6mg  PO once daily   Adherence: confirms.   Efficacy: Reports great results so far!   Monitoring:  TB screening: completed Hep B screening: completed Triglycerides: results from 11/04/2024 wnl  LFTs: history of transaminitis with most recent results from 11/04/24 normal S/sx of infection: none  Current adverse effects: -Skin changes/folliculitis: none -Oral sores: none -Muscle pain: none -Angioedema: none  O:     Lab Results  Component Value Date   WBC 7.5 11/02/2023   HGB 16.0 11/02/2023   HCT 45.7 11/02/2023   MCV 91.7 11/02/2023   PLT 178.0 11/02/2023      Chemistry      Component Value Date/Time   NA 142 11/04/2024 0927   NA 143 08/27/2022 1145   K 4.1 11/04/2024 0927   CL 107 11/04/2024 0927   CO2 29 11/04/2024 0927   BUN 18 11/04/2024 0927   BUN 17 08/27/2022 1145   CREATININE 0.97 11/04/2024 0927   CREATININE 1.26 05/24/2021 1600   GLU 100 10/02/2017 0000      Component Value Date/Time   CALCIUM  9.5 11/04/2024 0927   ALKPHOS 76 11/04/2024 0927   AST 29 11/04/2024 0927   ALT 37 11/04/2024 0927   BILITOT 0.6 11/04/2024 0927   BILITOT 0.7 08/27/2022 1145       A/P: 1. Medication review: patient currently on Deucravacitinib  for plaque psoriasis and is tolerating it well. Reviewed the medication with the patient including the following: deucravacitinib  is an oral tyrosine kinase 2 inhibitor used in the treatment of plaque psoriasis. It works by blocking tyrosine kinase 2 and these leads to decreased release of pro-inflammatory cytokines. It can be taken with or without food but should not be crushed or chewed. It should not be used in patients with higher cardiovascular risk, hepatic dysfunction (liver transaminase levels greater than 3x UNL), previous  hypersensitivity reactions to deucravacitinib , active infection, or a history of increased CK levels or a hx of rhabdomyolysis. Patients should avoid live vaccines during treatment. Malignancies have been reported and use should be avoided in patients with prior malignancies. Common adverse effects include folliculitis, oral ulcers, and/or URI. While rare, other adverse effects such as increased triglycerides, infection, malignancy, or angioedema have occurred. Oral tablets should be stored in a dry area at room temperature. No recommendation for any changes at this time.   Herlene Fleeta Morris, PharmD, JAQUELINE, CPP Clinical Pharmacist [facility] [phone]      "

## 2024-12-27 ENCOUNTER — Other Ambulatory Visit: Payer: Self-pay

## 2024-12-28 ENCOUNTER — Other Ambulatory Visit: Payer: Self-pay

## 2024-12-29 ENCOUNTER — Other Ambulatory Visit: Payer: Self-pay

## 2024-12-29 NOTE — Progress Notes (Signed)
 Specialty Pharmacy Refill Coordination Note  Daniel Calderon is a 59 y.o. male contacted today regarding refills of specialty medication(s) Deucravacitinib  (Sotyktu )   Patient requested Delivery   Delivery date: 12/30/24   Verified address: [address]  [place]   Medication will be filled on: 12/29/24

## 2025-01-01 ENCOUNTER — Other Ambulatory Visit: Payer: Self-pay

## 2025-01-02 ENCOUNTER — Other Ambulatory Visit: Admission: RE | Admit: 2025-01-02 | Discharge: 2025-01-02 | Disposition: A | Discharge status: Home / Self Care

## 2025-01-02 ENCOUNTER — Other Ambulatory Visit: Payer: Self-pay

## 2025-01-02 ENCOUNTER — Telehealth: Payer: Self-pay

## 2025-01-02 DIAGNOSIS — L409 Psoriasis, unspecified: Secondary | ICD-10-CM | POA: Insufficient documentation

## 2025-01-02 NOTE — Telephone Encounter (Signed)
-----   Message from Lauraine Kanaris, MD sent at 01/02/2025  8:13 AM EST ----- Please remind pt to get TB test done, thx

## 2025-01-02 NOTE — Telephone Encounter (Signed)
 Discussed with patient, he said he had them drawn around lunch time today.

## 2025-01-03 LAB — QUANTIFERON-TB GOLD PLUS (RQFGPL)
QuantiFERON Mitogen Value: >10 [IU]/mL
QuantiFERON Nil Value: 0.09 [IU]/mL
QuantiFERON TB1 Ag Value: 0.05 [IU]/mL
QuantiFERON TB2 Ag Value: 0.05 [IU]/mL

## 2025-01-03 LAB — QUANTIFERON-TB GOLD PLUS: QuantiFERON-TB Gold Plus: NEGATIVE

## 2025-01-04 ENCOUNTER — Ambulatory Visit: Payer: Self-pay

## 2025-01-04 NOTE — Progress Notes (Signed)
 Patient was identified in voicemail so results were left on voicemail. Patient instructed to call back with questions.

## 2025-01-05 ENCOUNTER — Other Ambulatory Visit: Payer: Self-pay

## 2025-01-05 ENCOUNTER — Encounter: Payer: Self-pay | Admitting: Nurse Practitioner

## 2025-01-05 ENCOUNTER — Ambulatory Visit: Admitting: Nurse Practitioner

## 2025-01-05 VITALS — BP 124/82 | HR 74 | Temp 97.8°F | Ht 74.0 in | Wt 217.6 lb

## 2025-01-05 DIAGNOSIS — J0140 Acute pansinusitis, unspecified: Secondary | ICD-10-CM | POA: Diagnosis not present

## 2025-01-05 MED ORDER — AMOXICILLIN-POT CLAVULANATE 875-125 MG PO TABS
1.0000 | ORAL_TABLET | Freq: Two times a day (BID) | ORAL | 0 refills | Status: AC
  Filled 2025-01-05: qty 20, 10d supply, fill #0

## 2025-01-05 MED ORDER — FLUTICASONE PROPIONATE 50 MCG/ACT NA SUSP
2.0000 | Freq: Every day | NASAL | 1 refills | Status: AC
  Filled 2025-01-05: qty 16, 30d supply, fill #0

## 2025-01-05 MED ORDER — PREDNISONE 20 MG PO TABS
40.0000 mg | ORAL_TABLET | Freq: Every day | ORAL | 0 refills | Status: AC
  Filled 2025-01-05: qty 10, 5d supply, fill #0

## 2025-01-05 NOTE — Progress Notes (Signed)
 "  Established Patient Office Visit  Subjective:  Patient ID: Daniel Calderon, male    DOB: 1966-02-25  Age: 59 y.o. MRN: [id number]  CC:  Chief Complaint  Patient presents with   Acute Visit    Ears stopped up, chest congestion, headache & joint pain x 5 weeks Prior video visit  Right knee pain 6-8/10    History of Present Illness  HPI  Discussed the use of AI scribe software for clinical note transcription with the patient, who gave verbal consent to proceed.  History of Present Illness   Daniel Calderon is a 59 year old male who presents with persistent nasal congestion and sinus pressure for five weeks.  He reports significant nasal congestion and sinus pressure for five weeks, worse in the mornings and making breathing difficult. Symptoms began with dark green to brown nasal discharge that has since cleared. He denies cough, fever or headache.  He previously used antibiotics and Afrin with only temporary relief. Allergy medications, neti pot, and his CPAP have not improved symptoms. He now notes sinus tenderness, ear congestion, and recent ear pain after cleaning his ears.  He has nausea and vomiting with dexamethasone  and feels jittery with prednisone . He is willing to try prednisone  and will let us  know if has any side effects from the medication. He uses Flonase  without issue and has tolerated Augmentin  in the past.      Past Medical History:  Diagnosis Date   Allergy    Seasonal   Anxiety    Arthritis    Asthma    Benign enlargement of prostate    Colon polyps    Complex sleep apnea syndrome    Depression    Elevated BP    Elevated transaminase level    Failure of erection    Fatigue    Fatty liver    Frequent headaches    GERD (gastroesophageal reflux disease)    History of kidney stones    Hyperlipidemia    Hypertension    Hypogonadism in male    Migraine    NAFL (nonalcoholic fatty liver)    Obstructive sleep apnea treated with BiPAP    Psoriasis     Sinus congestion 09/01/2017   Splenomegaly    Thoracic outlet syndrome    Left Arm   Tongue pain 02/09/2019    Past Surgical History:  Procedure Laterality Date   CERVICAL DISC ARTHROPLASTY N/A 02/03/2024   Procedure: C6-7 ARTHROPLASTY;  Surgeon: Clois Fret, MD;  Location: [facility];  Service: Neurosurgery;  Laterality: N/A;   COLONOSCOPY WITH PROPOFOL  N/A 11/11/2017   Procedure: COLONOSCOPY WITH PROPOFOL ;  Surgeon: Unk Corinn Skiff, MD;  Location: [facility];  Service: Endoscopy;  Laterality: N/A;   COLONOSCOPY WITH PROPOFOL  N/A 10/14/2022   Procedure: COLONOSCOPY WITH PROPOFOL ;  Surgeon: Unk Corinn Skiff, MD;  Location: [facility];  Service: Endoscopy;  Laterality: N/A;  sleep apnea   ESOPHAGOGASTRODUODENOSCOPY (EGD) WITH PROPOFOL  N/A 08/25/2019   Procedure: ESOPHAGOGASTRODUODENOSCOPY (EGD) WITH PROPOFOL ;  Surgeon: Unk Corinn Skiff, MD;  Location: [facility];  Service: Gastroenterology;  Laterality: N/A;   KNEE ARTHROSCOPY WITH MEDIAL MENISECTOMY Left 07/26/2018   Procedure: KNEE ARTHROSCOPY WITH PARTIAL MEDIAL MENISECTOMY;  Surgeon: Tobie Priest, MD;  Location: [facility];  Service: Orthopedics;  Laterality: Left;   NASAL SEPTUM SURGERY     nerve block     2 in neck. 5 in back.   POLYPECTOMY  11/11/2017   Procedure: POLYPECTOMY;  Surgeon: Unk Corinn Skiff,  MD;  Location: [facility];  Service: Endoscopy;;   SCALENE NODE BIOPSY / EXCISION     THORACIC OUTLET SURGERY Left 2011   VASECTOMY      Family History  Problem Relation Age of Onset   Hypertension Mother        Living   Hearing loss Mother    Stroke Mother 58   Heart attack Mother 44   Skin cancer Father        believes melanoma   Colon cancer Sister 53       stage 3 colon cancer   Diabetes Brother    Hearing loss Maternal Grandmother    Healthy Daughter    Healthy Son    Stroke Paternal Uncle    Heart attack Paternal Uncle    Heart disease Other    Kidney cancer  Neg Hx    Bladder Cancer Neg Hx    Prostate cancer Neg Hx    AAA (abdominal aortic aneurysm) Neg Hx     Social History   Socioeconomic History   Marital status: Married    Spouse name: Channing   Number of children: 2   Years of education: 13   Highest education level: Not on file  Occupational History   Not on file  Tobacco Use   Smoking status: Never   Smokeless tobacco: Never  Vaping Use   Vaping status: Never Used  Substance and Sexual Activity   Alcohol use: Yes    Alcohol/week: 10.0 standard drinks of alcohol    Types: 10 Standard drinks or equivalent per week    Comment: daily   Drug use: No   Sexual activity: Yes    Partners: Female    Comment: Wife  Other Topics Concern   Not on file  Social History Narrative   [facility]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    Lives with wife    daughter 63 , works as PT; has a surveyor, quantity who lives near by   Son , 61 ; he is pharmacist, hospital in [place] in [employer] school and tech school   Caffeine- 2-3 coffee    Enjoys- yard work, Metallurgist- 2 dogs    Right handed   One story home   Social Drivers of Health   Tobacco Use: Low Risk (01/05/2025)   Patient History    Smoking Tobacco Use: Never    Smokeless Tobacco Use: Never    Passive Exposure: Not on file  Financial Resource Strain: Patient Declined (10/30/2023)   Overall Financial Resource Strain (CARDIA)    Difficulty of Paying Living Expenses: Patient declined  Food Insecurity: Patient Declined (10/30/2023)   Hunger Vital Sign    Worried About Running Out of Food in the Last Year: Patient declined    Ran Out of Food in the Last Year: Patient declined  Transportation Needs: Patient Declined (10/30/2023)   PRAPARE - Administrator, Civil Service (Medical): Patient declined    Lack of Transportation (Non-Medical): Patient declined  Physical Activity: Not on file  Stress: Not on file  Social Connections: Unknown (10/30/2023)   Social Connection and Isolation Panel     Frequency of Communication with Friends and Family: Patient declined    Frequency of Social Gatherings with Friends and Family: Patient declined    Attends Religious Services: Patient declined    Active Member of Clubs or Organizations: Patient declined    Attends Banker Meetings: Not on file    Marital  Status: Patient declined  Intimate Partner Violence: Unknown (06/30/2022)   Received from [facility]   HITS    Physically Hurt: Not on file    Insult or Talk Down To: Not on file    Threaten Physical Harm: Not on file    Scream or Curse: Not on file  Depression (PHQ2-9): Low Risk (01/05/2025)   Depression (PHQ2-9)    PHQ-2 Score: 4  Alcohol Screen: Medium Risk (10/30/2023)   Alcohol Screen    Last Alcohol Screening Score (AUDIT): 10  Housing: Not on file  Utilities: Not on file  Health Literacy: Not on file     Outpatient Medications Prior to Visit  Medication Sig Dispense Refill   amLODipine  (NORVASC ) 10 MG tablet Take 1 tablet (10 mg total) by mouth daily. 90 tablet 3   clobetasol  ointment (TEMOVATE ) 0.05 % Apply 1 gram topically to affected area of skin twice daily. Stop once resolved and restart as needed for flares. Avoid use on face, armpits, groin unless otherwise indicated. 60 g 5   cyanocobalamin  (VITAMIN B12) 1000 MCG tablet Take 1,000 mcg by mouth daily.     Deucravacitinib  (SOTYKTU ) 6 MG TABS Take 6 mg by mouth daily. 30 tablet 3   omeprazole  (PRILOSEC) 20 MG capsule Take 1 capsule (20 mg total) by mouth daily. 90 capsule 3   ondansetron  (ZOFRAN -ODT) 4 MG disintegrating tablet Take 1 tablet (4 mg total) by mouth every 8 (eight) hours as needed for nausea or vomiting. 20 tablet 0   sertraline  (ZOLOFT ) 100 MG tablet Take 1 tablet (100 mg total) by mouth daily. 90 tablet 3   Facility-Administered Medications Prior to Visit  Medication Dose Route Frequency Provider Last Rate Last Admin   phenylephrine  100 mcg/mL CONC. dilution injection for priapism  (Outpatient [facility] USE ONLY)  200 mcg Intracavernosal Once         Allergies[1]  ROS Review of Systems Negative unless indicated in HPI.    Objective:    Physical Exam Constitutional:      Appearance: Normal appearance.  HENT:     Right Ear: A middle ear effusion is present. Tympanic membrane is not erythematous.     Left Ear: A middle ear effusion is present. Tympanic membrane is not erythematous.     Nose:     Right Turbinates: Not enlarged.     Left Turbinates: Not enlarged.     Right Sinus: No maxillary sinus tenderness or frontal sinus tenderness.     Left Sinus: No maxillary sinus tenderness or frontal sinus tenderness.     Mouth/Throat:     Mouth: Mucous membranes are moist.     Pharynx: No pharyngeal swelling, oropharyngeal exudate or posterior oropharyngeal erythema.     Tonsils: No tonsillar exudate.  Cardiovascular:     Rate and Rhythm: Normal rate and regular rhythm.  Pulmonary:     Effort: Pulmonary effort is normal.     Breath sounds: Normal breath sounds. No stridor. No wheezing.  Neurological:     General: No focal deficit present.     Mental Status: He is alert and oriented to person, place, and time. Mental status is at baseline.  Psychiatric:        Mood and Affect: Mood normal.        Behavior: Behavior normal.        Thought Content: Thought content normal.        Judgment: Judgment normal.     BP 124/82   Pulse 74  Temp 97.8 F (36.6 C)   Ht 6' 2 (1.88 m)   Wt 217 lb 9.6 oz (98.7 kg)   SpO2 97%   BMI 27.94 kg/m  Wt Readings from Last 3 Encounters:  01/05/25 217 lb 9.6 oz (98.7 kg)  11/04/24 211 lb 6.4 oz (95.9 kg)  08/17/24 210 lb (95.3 kg)     Health Maintenance  Topic Date Due   Pneumococcal Vaccine: 50+ Years (1 of 2 - PCV) Never done   Zoster Vaccines- Shingrix (1 of 2) Never done   DTaP/Tdap/Td (2 - Td or Tdap) 12/24/2023   COVID-19 Vaccine (4 - 2025-26 season) 08/22/2024   Colonoscopy  10/15/2027   Influenza  Vaccine  Completed   HPV VACCINES (No Doses Required) Completed   Hepatitis C Screening  Completed   HIV Screening  Completed   Meningococcal B Vaccine  Aged Out   Hepatitis B Vaccines 19-59 Average Risk  Discontinued    There are no preventive care reminders to display for this patient.  Lab Results  Component Value Date   TSH 1.32 11/04/2024   Lab Results  Component Value Date   WBC 7.5 11/02/2023   HGB 16.0 11/02/2023   HCT 45.7 11/02/2023   MCV 91.7 11/02/2023   PLT 178.0 11/02/2023   Lab Results  Component Value Date   NA 142 11/04/2024   K 4.1 11/04/2024   CO2 29 11/04/2024   GLUCOSE 90 11/04/2024   BUN 18 11/04/2024   CREATININE 0.97 11/04/2024   BILITOT 0.6 11/04/2024   ALKPHOS 76 11/04/2024   AST 29 11/04/2024   ALT 37 11/04/2024   PROT 6.5 11/04/2024   ALBUMIN 4.9 11/04/2024   CALCIUM  9.5 11/04/2024   ANIONGAP 10 01/18/2024   EGFR 86 08/27/2022   GFR 87.49 11/04/2024   Lab Results  Component Value Date   CHOL 166 11/04/2024   Lab Results  Component Value Date   HDL 40.20 11/04/2024   Lab Results  Component Value Date   LDLCALC 103 (H) 11/04/2024   Lab Results  Component Value Date   TRIG 114.0 11/04/2024   Lab Results  Component Value Date   CHOLHDL 4 11/04/2024   Lab Results  Component Value Date   HGBA1C 4.5 (L) 11/04/2024      Assessment & Plan:   Assessment & Plan Acute pansinusitis, recurrence not specified Nasal congestion and sinus pressure for five weeks. Symptoms suggestive of bacterial sinusitis. Previous reactions to dexamethasone  nausea and vomiting and prednisone  makes irritated. Pt is willing to try steroids and will let us  know if experienced any allergic reaction.    - Prescribed Augmentin  twice a day for 10 days. - Prescribed prednisone  for inflammation despite previous jitteriness. - Instructed to use Flonase  nasal spray twice daily. - Advised on supportive measures: lukewarm water , salt water  gargles, steam  inhalation, and hot showers.      Assessment & Plan        Follow-up: Return if symptoms worsen or fail to improve.   Chelsea Aurora, NP     [1]  Allergies Allergen Reactions   Erythromycin Nausea And Vomiting   Dexamethasone  Nausea And Vomiting   Keflex [Cephalexin] Other (See Comments)    Turned Pt tanned or orange   Percocet [Oxycodone -Acetaminophen ] Other (See Comments)    Very sensitive - Makes Pt very sedated    Prednisone  Other (See Comments)    Makes pt upset or irritated   Gabapentin  Other (See Comments)    Aggressive   "

## 2025-01-05 NOTE — Assessment & Plan Note (Signed)
 Nasal congestion and sinus pressure for five weeks. Symptoms suggestive of bacterial sinusitis. Previous reactions to dexamethasone  nausea and vomiting and prednisone  makes irritated. Pt is willing to try steroids and will let us  know if experienced any allergic reaction.    - Prescribed Augmentin  twice a day for 10 days. - Prescribed prednisone  for inflammation despite previous jitteriness. - Instructed to use Flonase  nasal spray twice daily. - Advised on supportive measures: lukewarm water , salt water  gargles, steam inhalation, and hot showers.

## 2025-01-17 ENCOUNTER — Other Ambulatory Visit: Payer: Self-pay

## 2025-01-20 ENCOUNTER — Other Ambulatory Visit: Payer: Self-pay

## 2025-01-24 ENCOUNTER — Other Ambulatory Visit (HOSPITAL_COMMUNITY): Payer: Self-pay

## 2025-01-26 ENCOUNTER — Other Ambulatory Visit (HOSPITAL_COMMUNITY): Payer: Self-pay
